# Patient Record
Sex: Male | Born: 1990 | Hispanic: No | Marital: Single | Smoking: Never smoker
Health system: Southern US, Community
[De-identification: ages and names within clinical notes are randomized; demographics above are authoritative.]

## PROBLEM LIST (undated history)

## (undated) DIAGNOSIS — I10 Essential (primary) hypertension: Secondary | ICD-10-CM

## (undated) DIAGNOSIS — G43909 Migraine, unspecified, not intractable, without status migrainosus: Secondary | ICD-10-CM

## (undated) DIAGNOSIS — F419 Anxiety disorder, unspecified: Secondary | ICD-10-CM

## (undated) DIAGNOSIS — K589 Irritable bowel syndrome without diarrhea: Secondary | ICD-10-CM

## (undated) HISTORY — PX: CHOLECYSTECTOMY: SHX55

## (undated) HISTORY — PX: TONSILLECTOMY: SUR1361

## (undated) HISTORY — PX: APPENDECTOMY: SHX54

---
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
## 2009-03-06 ENCOUNTER — Emergency Department: Payer: Self-pay | Admitting: Emergency Medicine

## 2017-09-07 ENCOUNTER — Ambulatory Visit
Admission: EM | Admit: 2017-09-07 | Discharge: 2017-09-07 | Disposition: A | Discharge status: Home / Self Care | Payer: BLUE CROSS/BLUE SHIELD | Attending: Family Medicine | Admitting: Family Medicine

## 2017-09-07 ENCOUNTER — Other Ambulatory Visit: Payer: Self-pay

## 2017-09-07 DIAGNOSIS — R509 Fever, unspecified: Secondary | ICD-10-CM

## 2017-09-07 DIAGNOSIS — J020 Streptococcal pharyngitis: Secondary | ICD-10-CM

## 2017-09-07 HISTORY — DX: Irritable bowel syndrome without diarrhea: K58.9

## 2017-09-07 HISTORY — DX: Essential (primary) hypertension: I10

## 2017-09-07 LAB — RAPID STREP SCREEN (MED CTR MEBANE ONLY): Streptococcus, Group A Screen (Direct): NEGATIVE

## 2017-09-07 MED ORDER — PENICILLIN G BENZATHINE 1200000 UNIT/2ML IM SUSP
1.2000 10*6.[IU] | Freq: Once | INTRAMUSCULAR | Status: AC
  Administered 2017-09-07: 1.2 10*6.[IU] via INTRAMUSCULAR

## 2017-09-07 MED ORDER — PENICILLIN G BENZATHINE & PROC 900000-300000 UNIT/2ML IM SUSP: 1.2000 10*6.[IU] | Freq: Once | INTRAMUSCULAR | Status: DC

## 2017-09-07 NOTE — Discharge Instructions (Signed)
Please take Tylenol or ibuprofen as needed for fevers or body aches.  Drink lots of fluids.  Return to the clinic for any worsening symptoms or any changes in her health.

## 2017-09-07 NOTE — ED Triage Notes (Signed)
Patient c/o sore throat, body aches and a slight headache x 2 days.

## 2017-09-07 NOTE — ED Provider Notes (Signed)
[facility]    CSN: [id number] Arrival date & time: 09/07/17  0901     History   Chief Complaint Chief Complaint  Patient presents with  . Sore Throat    HPI Glenn Rasmussen is a 26 y.o. male presents to the urgent care facility for evaluation of fever, sore throat, body aches times 2 days.  No known contact with strep.  He denies any cough congestion runny nose.  Patient states he has had strep several times, recently looked at his throat and noticed strep-like appearance.  He has not had a Tylenol or ibuprofen.  He is tolerating p.o. well.  Denies any trismus.  No rashes, abdominal pain, urinary changes.  HPI  Past Medical History:  Diagnosis Date  . Hypertension   . IBS (irritable bowel syndrome)     There are no active problems to display for this patient.   Past Surgical History:  Procedure Laterality Date  . APPENDECTOMY    . CHOLECYSTECTOMY    . TONSILLECTOMY         Home Medications    Prior to Admission medications   Medication Sig Start Date End Date Taking? Authorizing Provider  amLODipine-valsartan (EXFORGE) 10-320 MG tablet Take 1 tablet by mouth daily.   Yes [provider]  desipramine (NOPRAMIN) 150 MG tablet Take 150 mg by mouth at bedtime.   Yes [provider]  dicyclomine (BENTYL) 20 MG tablet Take 20 mg by mouth every 6 (six) hours.   Yes [provider]  hydrochlorothiazide (HYDRODIURIL) 12.5 MG tablet Take 12.5 mg by mouth daily.   Yes [provider]  linaclotide (LINZESS) 72 MCG capsule Take 72 mcg by mouth daily before breakfast.   Yes [provider]  QUEtiapine Fumarate (SEROQUEL XR) 150 MG 24 hr tablet Take 150 mg by mouth at bedtime.   Yes [provider]    Family History History reviewed. No pertinent family history.  Social History Social History   Tobacco Use  . Smoking status: Never Smoker  . Smokeless tobacco: Never Used  Substance Use Topics  . Alcohol  use: No    Frequency: Never  . Drug use: No     Allergies   Patient has no known allergies.   Review of Systems Review of Systems  Constitutional: Positive for fever.  HENT: Positive for sore throat. Negative for congestion, tinnitus and trouble swallowing.   Respiratory: Negative for cough and shortness of breath.   Cardiovascular: Negative for chest pain.  Gastrointestinal: Negative for abdominal pain.  Genitourinary: Negative for difficulty urinating, dysuria and urgency.  Musculoskeletal: Negative for back pain and myalgias.  Skin: Negative for rash.  Neurological: Negative for dizziness and headaches.     Physical Exam Triage Vital Signs ED Triage Vitals  Enc Vitals Group     BP 09/07/17 1002 140/72     Pulse Rate 09/07/17 1002 (!) 119     Resp --      Temp 09/07/17 1002 99.8 F (37.7 C)     Temp Source 09/07/17 1002 Oral     SpO2 09/07/17 1002 97 %     Weight 09/07/17 1003 210 lb (95.3 kg)     Height 09/07/17 1003 5\' 7"  (1.702 m)     Head Circumference --      Peak Flow --      Pain Score 09/07/17 1004 6     Pain Loc --      Pain Edu? --  Excl. in [place]? --    No data found.  Updated Vital Signs BP 140/72 (BP Location: Left Arm)   Pulse (!) 119   Temp 99.8 F (37.7 C) (Oral)   Ht 5\' 7"  (1.702 m)   Wt 210 lb (95.3 kg)   SpO2 97%   BMI 32.89 kg/m   Visual Acuity Right Eye Distance:   Left Eye Distance:   Bilateral Distance:    Right Eye Near:   Left Eye Near:    Bilateral Near:     Physical Exam  Constitutional: He is oriented to person, place, and time. He appears well-developed and well-nourished.  HENT:  Head: Normocephalic and atraumatic.  Right Ear: Hearing, tympanic membrane and ear canal normal.  Left Ear: Hearing, tympanic membrane, external ear and ear canal normal.  Mouth/Throat: Uvula is midline. Oropharyngeal exudate and posterior oropharyngeal erythema present. No posterior oropharyngeal edema.  Eyes: Conjunctivae are normal.    Neck: Normal range of motion.  Cardiovascular: Normal rate.  Pulmonary/Chest: Effort normal. No respiratory distress.  Musculoskeletal: Normal range of motion.  Lymphadenopathy:    He has cervical adenopathy (.  Anterior cervical lymphadenopathy).  Neurological: He is alert and oriented to person, place, and time.  Skin: Skin is warm. No rash noted.  Psychiatric: He has a normal mood and affect. His behavior is normal. Thought content normal.     UC Treatments / Results  Labs (all labs ordered are listed, but only abnormal results are displayed) Labs Reviewed  RAPID STREP SCREEN (NOT AT [facility])  CULTURE, GROUP A STREP [facility])    EKG  EKG Interpretation None       Radiology No results found.  Procedures Procedures (including critical care time)  Medications Ordered in UC Medications  penicillin g benzathine (BICILLIN LA) 1200000 UNIT/2ML injection 1.2 Million Units (1.2 Million Units Intramuscular Given 09/07/17 1028)     Initial Impression / Assessment and Plan / UC Course  I have reviewed the triage vital signs and the nursing notes.  Pertinent labs & imaging results that were available during my care of the patient were reviewed by me and considered in my medical decision making (see chart for details).     26 year old male with sore throat, fever, body aches and headache.  He has absence of cough and other cold symptoms with positive anterior cervical lymphadenopathy.  We will treat for strep.  Patient requested 1,200,000 units penicillin G IM.  Patient educated on signs and symptoms return to clinic for.   Final Clinical Impressions(s) / UC Diagnoses   Final diagnoses:  Pharyngitis due to Streptococcus species  Fever, unspecified    ED Discharge Orders    None        Duanne Guess, PA-C 09/07/17 1037

## 2017-09-09 LAB — CULTURE, GROUP A STREP (THRC)

## 2019-02-09 ENCOUNTER — Ambulatory Visit
Admission: EM | Admit: 2019-02-09 | Discharge: 2019-02-09 | Disposition: A | Discharge status: Home / Self Care | Payer: BLUE CROSS/BLUE SHIELD | Attending: Family Medicine | Admitting: Family Medicine

## 2019-02-09 ENCOUNTER — Ambulatory Visit (INDEPENDENT_AMBULATORY_CARE_PROVIDER_SITE_OTHER): Payer: BLUE CROSS/BLUE SHIELD

## 2019-02-09 ENCOUNTER — Other Ambulatory Visit: Payer: Self-pay

## 2019-02-09 DIAGNOSIS — S52124A Nondisplaced fracture of head of right radius, initial encounter for closed fracture: Secondary | ICD-10-CM

## 2019-02-09 DIAGNOSIS — W108XXA Fall (on) (from) other stairs and steps, initial encounter: Secondary | ICD-10-CM | POA: Diagnosis not present

## 2019-02-09 DIAGNOSIS — M79631 Pain in right forearm: Secondary | ICD-10-CM

## 2019-02-09 DIAGNOSIS — R079 Chest pain, unspecified: Secondary | ICD-10-CM

## 2019-02-09 DIAGNOSIS — S2231XA Fracture of one rib, right side, initial encounter for closed fracture: Secondary | ICD-10-CM | POA: Diagnosis not present

## 2019-02-09 DIAGNOSIS — M25531 Pain in right wrist: Secondary | ICD-10-CM

## 2019-02-09 MED ORDER — OXYCODONE-ACETAMINOPHEN 5-325 MG PO TABS: 1.0000 | ORAL_TABLET | Freq: Three times a day (TID) | ORAL | 0 refills | Status: DC | PRN

## 2019-02-09 NOTE — Discharge Instructions (Signed)
Follow up with orthopedics.  Pain medication as needed.  Take care  Dr. Lacinda Axon

## 2019-02-09 NOTE — ED Provider Notes (Signed)
[facility]    CSN: [id number] Arrival date & time: 02/09/19  1553  History   Chief Complaint Chief Complaint  Patient presents with  . Fall  . Wrist Pain  . Chest Pain   HPI 28 year old male presents with the above complaints.  Patient reports that 1 week ago he fell down 20 steps while carrying a mattress.  Patient states that he injured his right wrist and forearm.  Patient has bruising of his upper medial right arm.  Patient also reports a discrete area of rib pain anteriorly on the right side.  Pain is currently severe, 8/10 in severity.  Worse with range of motion of the forearm and wrist.  Patient also has pain when taking a deep breath.  No reports of shortness of breath.  No fever.  No other associated symptoms.  No relieving factors.  No other complaints.  Past Medical History:  Diagnosis Date  . Hypertension   . IBS (irritable bowel syndrome)    Past Surgical History:  Procedure Laterality Date  . APPENDECTOMY    . CHOLECYSTECTOMY    . TONSILLECTOMY     Home Medications    Prior to Admission medications   Medication Sig Start Date End Date Taking? Authorizing Provider  amLODipine-valsartan (EXFORGE) 10-320 MG tablet Take 1 tablet by mouth daily.   Yes [provider]  clonazePAM (KLONOPIN) 0.5 MG tablet TK 1 T PO BID UTD 07/31/18  Yes [provider]  desipramine (NOPRAMIN) 150 MG tablet Take 150 mg by mouth at bedtime.   Yes [provider]  dicyclomine (BENTYL) 20 MG tablet Take 20 mg by mouth every 6 (six) hours.   Yes [provider]  eszopiclone (LUNESTA) 1 MG TABS tablet Take 1 mg by mouth at bedtime as needed for sleep. Take immediately before bedtime   Yes [provider]  hydrochlorothiazide (HYDRODIURIL) 12.5 MG tablet Take 12.5 mg by mouth daily.   Yes [provider]  lisdexamfetamine (VYVANSE) 30 MG capsule TK 1 C PO QD UTD 07/09/18  Yes [provider]  promethazine (PHENERGAN)  25 MG tablet Take 25 mg by mouth every 6 (six) hours as needed for nausea or vomiting.   Yes [provider]  traZODone (DESYREL) 100 MG tablet TK 2 AND 1/2 TS PO HS PRN 07/18/18  Yes [provider]  losartan (COZAAR) 100 MG tablet Take by mouth.    [provider]  oxyCODONE-acetaminophen (PERCOCET/ROXICET) 5-325 MG tablet Take 1 tablet by mouth every 8 (eight) hours as needed for severe pain. 02/09/19   Coral Spikes, DO   Family History Family History  Problem Relation Age of Onset  . Lung cancer Mother   . Lung cancer Father    Social History Social History   Tobacco Use  . Smoking status: Never Smoker  . Smokeless tobacco: Never Used  Substance Use Topics  . Alcohol use: No    Frequency: Never  . Drug use: No    Allergies   Patient has no known allergies.   Review of Systems Review of Systems  Respiratory: Negative for shortness of breath.   Musculoskeletal:       Right lower rib pain, Right forearm pain, Right wrist pain.   Physical Exam Triage Vital Signs ED Triage Vitals  Enc Vitals Group     BP 02/09/19 1613 132/82     Pulse Rate 02/09/19 1613 76     Resp 02/09/19 1613 16  Temp 02/09/19 1613 98.8 F (37.1 C)     Temp Source 02/09/19 1613 Oral     SpO2 02/09/19 1613 100 %     Weight 02/09/19 1610 200 lb (90.7 kg)     Height 02/09/19 1610 5\' 7"  (1.702 m)     Head Circumference --      Peak Flow --      Pain Score 02/09/19 1610 8     Pain Loc --      Pain Edu? --      Excl. in [place]? --    Updated Vital Signs BP 132/82 (BP Location: Right Arm)   Pulse 76   Temp 98.8 F (37.1 C) (Oral)   Resp 16   Ht 5\' 7"  (1.702 m)   Wt 90.7 kg   SpO2 100%   BMI 31.32 kg/m   Visual Acuity Right Eye Distance:   Left Eye Distance:   Bilateral Distance:    Right Eye Near:   Left Eye Near:    Bilateral Near:     Physical Exam Vitals signs and nursing note reviewed.  Constitutional:      General: He is not in acute distress.     Appearance: Normal appearance.  HENT:     Head: Normocephalic and atraumatic.  Eyes:     General:        Right eye: No discharge.        Left eye: No discharge.     Conjunctiva/sclera: Conjunctivae normal.  Cardiovascular:     Rate and Rhythm: Normal rate and regular rhythm.  Pulmonary:     Effort: Pulmonary effort is normal.     Breath sounds: Normal breath sounds. No wheezing or rales.  Musculoskeletal:     Comments: Right wrist -patient with tenderness particularly on the ulnar aspect.  Right forearm and elbow -patient with diffuse tenderness along the ulna.  Patient also has a discrete area of tenderness near the radial head.  Skin:    Comments: Bruising noted on the right upper medial arm.  Neurological:     Mental Status: He is alert.  Psychiatric:        Mood and Affect: Mood normal.        Behavior: Behavior normal.    UC Treatments / Results  Labs (all labs ordered are listed, but only abnormal results are displayed) Labs Reviewed - No data to display  EKG None  Radiology Dg Ribs Unilateral W/chest Right  Result Date: 02/09/2019 CLINICAL DATA:  28 year old male status post fall down 20 stairs EXAM: RIGHT RIBS AND CHEST - 3+ VIEW COMPARISON:  Prior chest x-ray 12/29/2006 FINDINGS: Subtle minimally displaced fracture of the anterolateral aspect of the right seventh rib. There is no evidence of pneumothorax or pleural effusion. Both lungs are clear. Heart size and mediastinal contours are within normal limits. IMPRESSION: Subtle nondisplaced fracture of the anterolateral aspect of the right seventh rib. Electronically Signed   By: Jacqulynn Cadet M.D.   On: 02/09/2019 17:24   Dg Forearm Right  Result Date: 02/09/2019 CLINICAL DATA:  History of multiple falls with proximal forearm pain, initial encounter EXAM: RIGHT FOREARM - 2 VIEW COMPARISON:  None. FINDINGS: There is a fracture of the radial head with articular extension identified. Mild impaction is noted at the  fracture site. Joint effusion is noted with elevation of the anterior fat pad. IMPRESSION: Radial head fracture with articular extension. Mildly impacted radial head fracture. Electronically Signed   By: Linus Mako.D.  On: 02/09/2019 17:24   Dg Wrist Complete Right  Result Date: 02/09/2019 CLINICAL DATA:  Recent falls with wrist pain, initial encounter EXAM: RIGHT WRIST - COMPLETE 3+ VIEW COMPARISON:  None. FINDINGS: No acute fracture is noted. Mild sclerosis is noted in the midportion of the scaphoid bone which may be related to prior trauma. No other bony abnormality is seen. No soft tissue abnormality is noted. IMPRESSION: No acute bony abnormality noted. Electronically Signed   By: Inez Catalina M.D.   On: 02/09/2019 17:22    Procedures Procedures (including critical care time)  Medications Ordered in UC Medications - No data to display  Initial Impression / Assessment and Plan / UC Course  I have reviewed the triage vital signs and the nursing notes.  Pertinent labs & imaging results that were available during my care of the patient were reviewed by me and considered in my medical decision making (see chart for details).    28 year old male presents for evaluation after suffering a fall.  X-rays of his wrist were negative.  X-ray of the forearm revealed a radial head fracture with articular in extension.  Mild impaction.  Patient placed in a sling.  Patient also found to have 7th rib fracture.  [place] controlled substance database reviewed.  Patient has received a recent prescription for Vicodin as well as a fairly recent prescription for hydromorphone.  In addition, patient is on Klonopin and Vyvanse.  I have concerns about his recent opiate prescriptions as well as the other controlled substances.  However, in the setting of acute fractures, I feel that pain medication is warranted.  I will only give him 6 pills due to my concerns.  Rx sent for oxycodone.  Advised to follow-up  with orthopedics.  Final Clinical Impressions(s) / UC Diagnoses   Final diagnoses:  Closed nondisplaced fracture of head of right radius, initial encounter  Closed fracture of one rib of right side, initial encounter     Discharge Instructions     Follow up with orthopedics.  Pain medication as needed.  Take care  Dr. Lacinda Axon    ED Prescriptions    Medication Sig Dispense Auth. Provider   oxyCODONE-acetaminophen (PERCOCET/ROXICET) 5-325 MG tablet Take 1 tablet by mouth every 8 (eight) hours as needed for severe pain. 6 tablet Coral Spikes, DO     Controlled Substance Prescriptions [place] Controlled Substance Registry consulted? Yes, I have consulted the [place] Controlled Substances Registry for this patient.   Coral Spikes, [place] 02/09/19 1908

## 2019-02-09 NOTE — ED Triage Notes (Signed)
Patient complains of right wrist pain that occurred after a fall that happened 1 week ago. Patient states that he has also been having rib pain on right side and is concerned that they may be broken. Patient reports that rib pain is worsening when laying down.

## 2019-03-08 ENCOUNTER — Inpatient Hospital Stay
Admission: EM | Admit: 2019-03-08 | Discharge: 2019-03-30 | DRG: 917 | Disposition: A | Discharge status: Other Acute Inpatient Hospital | Payer: BC Managed Care – PPO | Attending: Internal Medicine | Admitting: Internal Medicine

## 2019-03-08 ENCOUNTER — Encounter: Payer: Self-pay | Admitting: Emergency Medicine

## 2019-03-08 ENCOUNTER — Other Ambulatory Visit: Payer: Self-pay

## 2019-03-08 DIAGNOSIS — Z978 Presence of other specified devices: Secondary | ICD-10-CM

## 2019-03-08 DIAGNOSIS — N179 Acute kidney failure, unspecified: Secondary | ICD-10-CM | POA: Diagnosis present

## 2019-03-08 DIAGNOSIS — Z4659 Encounter for fitting and adjustment of other gastrointestinal appliance and device: Secondary | ICD-10-CM

## 2019-03-08 DIAGNOSIS — D631 Anemia in chronic kidney disease: Secondary | ICD-10-CM | POA: Diagnosis present

## 2019-03-08 DIAGNOSIS — E876 Hypokalemia: Secondary | ICD-10-CM | POA: Diagnosis present

## 2019-03-08 DIAGNOSIS — K589 Irritable bowel syndrome without diarrhea: Secondary | ICD-10-CM | POA: Diagnosis present

## 2019-03-08 DIAGNOSIS — R402132 Coma scale, eyes open, to sound, at arrival to emergency department: Secondary | ICD-10-CM | POA: Diagnosis present

## 2019-03-08 DIAGNOSIS — K219 Gastro-esophageal reflux disease without esophagitis: Secondary | ICD-10-CM | POA: Diagnosis present

## 2019-03-08 DIAGNOSIS — E875 Hyperkalemia: Secondary | ICD-10-CM | POA: Diagnosis not present

## 2019-03-08 DIAGNOSIS — T424X2A Poisoning by benzodiazepines, intentional self-harm, initial encounter: Secondary | ICD-10-CM | POA: Diagnosis present

## 2019-03-08 DIAGNOSIS — R34 Anuria and oliguria: Secondary | ICD-10-CM | POA: Diagnosis not present

## 2019-03-08 DIAGNOSIS — F329 Major depressive disorder, single episode, unspecified: Secondary | ICD-10-CM

## 2019-03-08 DIAGNOSIS — F32A Depression, unspecified: Secondary | ICD-10-CM

## 2019-03-08 DIAGNOSIS — H539 Unspecified visual disturbance: Secondary | ICD-10-CM

## 2019-03-08 DIAGNOSIS — T465X2A Poisoning by other antihypertensive drugs, intentional self-harm, initial encounter: Secondary | ICD-10-CM

## 2019-03-08 DIAGNOSIS — N170 Acute kidney failure with tubular necrosis: Secondary | ICD-10-CM | POA: Diagnosis present

## 2019-03-08 DIAGNOSIS — G4700 Insomnia, unspecified: Secondary | ICD-10-CM | POA: Diagnosis not present

## 2019-03-08 DIAGNOSIS — T50911A Poisoning by multiple unspecified drugs, medicaments and biological substances, accidental (unintentional), initial encounter: Secondary | ICD-10-CM | POA: Diagnosis present

## 2019-03-08 DIAGNOSIS — T426X2A Poisoning by other antiepileptic and sedative-hypnotic drugs, intentional self-harm, initial encounter: Secondary | ICD-10-CM | POA: Diagnosis not present

## 2019-03-08 DIAGNOSIS — R402252 Coma scale, best verbal response, oriented, at arrival to emergency department: Secondary | ICD-10-CM | POA: Diagnosis present

## 2019-03-08 DIAGNOSIS — J80 Acute respiratory distress syndrome: Secondary | ICD-10-CM | POA: Diagnosis not present

## 2019-03-08 DIAGNOSIS — R570 Cardiogenic shock: Secondary | ICD-10-CM | POA: Diagnosis not present

## 2019-03-08 DIAGNOSIS — R45851 Suicidal ideations: Secondary | ICD-10-CM

## 2019-03-08 DIAGNOSIS — J153 Pneumonia due to streptococcus, group B: Secondary | ICD-10-CM | POA: Diagnosis not present

## 2019-03-08 DIAGNOSIS — R0603 Acute respiratory distress: Secondary | ICD-10-CM

## 2019-03-08 DIAGNOSIS — R0902 Hypoxemia: Secondary | ICD-10-CM

## 2019-03-08 DIAGNOSIS — A419 Sepsis, unspecified organism: Secondary | ICD-10-CM | POA: Diagnosis not present

## 2019-03-08 DIAGNOSIS — Z992 Dependence on renal dialysis: Secondary | ICD-10-CM

## 2019-03-08 DIAGNOSIS — T50902A Poisoning by unspecified drugs, medicaments and biological substances, intentional self-harm, initial encounter: Secondary | ICD-10-CM | POA: Diagnosis present

## 2019-03-08 DIAGNOSIS — I120 Hypertensive chronic kidney disease with stage 5 chronic kidney disease or end stage renal disease: Secondary | ICD-10-CM | POA: Diagnosis present

## 2019-03-08 DIAGNOSIS — R402362 Coma scale, best motor response, obeys commands, at arrival to emergency department: Secondary | ICD-10-CM | POA: Diagnosis present

## 2019-03-08 DIAGNOSIS — J690 Pneumonitis due to inhalation of food and vomit: Secondary | ICD-10-CM | POA: Diagnosis present

## 2019-03-08 DIAGNOSIS — Z452 Encounter for adjustment and management of vascular access device: Secondary | ICD-10-CM

## 2019-03-08 DIAGNOSIS — F332 Major depressive disorder, recurrent severe without psychotic features: Secondary | ICD-10-CM | POA: Diagnosis present

## 2019-03-08 DIAGNOSIS — Z789 Other specified health status: Secondary | ICD-10-CM

## 2019-03-08 DIAGNOSIS — I10 Essential (primary) hypertension: Secondary | ICD-10-CM | POA: Diagnosis present

## 2019-03-08 DIAGNOSIS — J9600 Acute respiratory failure, unspecified whether with hypoxia or hypercapnia: Secondary | ICD-10-CM

## 2019-03-08 DIAGNOSIS — I959 Hypotension, unspecified: Secondary | ICD-10-CM

## 2019-03-08 DIAGNOSIS — E8770 Fluid overload, unspecified: Secondary | ICD-10-CM | POA: Diagnosis not present

## 2019-03-08 DIAGNOSIS — G92 Toxic encephalopathy: Secondary | ICD-10-CM | POA: Diagnosis present

## 2019-03-08 DIAGNOSIS — I472 Ventricular tachycardia: Secondary | ICD-10-CM | POA: Diagnosis not present

## 2019-03-08 DIAGNOSIS — R079 Chest pain, unspecified: Secondary | ICD-10-CM

## 2019-03-08 DIAGNOSIS — E870 Hyperosmolality and hypernatremia: Secondary | ICD-10-CM | POA: Diagnosis not present

## 2019-03-08 DIAGNOSIS — T1491XA Suicide attempt, initial encounter: Secondary | ICD-10-CM

## 2019-03-08 DIAGNOSIS — Z66 Do not resuscitate: Secondary | ICD-10-CM | POA: Diagnosis present

## 2019-03-08 DIAGNOSIS — J9690 Respiratory failure, unspecified, unspecified whether with hypoxia or hypercapnia: Secondary | ICD-10-CM

## 2019-03-08 DIAGNOSIS — R6521 Severe sepsis with septic shock: Secondary | ICD-10-CM | POA: Diagnosis not present

## 2019-03-08 DIAGNOSIS — I248 Other forms of acute ischemic heart disease: Secondary | ICD-10-CM | POA: Diagnosis not present

## 2019-03-08 DIAGNOSIS — Z801 Family history of malignant neoplasm of trachea, bronchus and lung: Secondary | ICD-10-CM

## 2019-03-08 DIAGNOSIS — T461X1A Poisoning by calcium-channel blockers, accidental (unintentional), initial encounter: Secondary | ICD-10-CM

## 2019-03-08 DIAGNOSIS — K567 Ileus, unspecified: Secondary | ICD-10-CM | POA: Diagnosis not present

## 2019-03-08 DIAGNOSIS — Z20828 Contact with and (suspected) exposure to other viral communicable diseases: Secondary | ICD-10-CM | POA: Diagnosis present

## 2019-03-08 DIAGNOSIS — E874 Mixed disorder of acid-base balance: Secondary | ICD-10-CM | POA: Diagnosis not present

## 2019-03-08 DIAGNOSIS — T461X2A Poisoning by calcium-channel blockers, intentional self-harm, initial encounter: Secondary | ICD-10-CM

## 2019-03-08 DIAGNOSIS — T465X1A Poisoning by other antihypertensive drugs, accidental (unintentional), initial encounter: Secondary | ICD-10-CM

## 2019-03-08 DIAGNOSIS — F419 Anxiety disorder, unspecified: Secondary | ICD-10-CM | POA: Diagnosis present

## 2019-03-08 DIAGNOSIS — R109 Unspecified abdominal pain: Secondary | ICD-10-CM

## 2019-03-08 LAB — CBC WITH DIFFERENTIAL/PLATELET
Abs Immature Granulocytes: 0.19 10*3/uL — ABNORMAL HIGH (ref 0.00–0.07)
Basophils Absolute: 0.1 10*3/uL (ref 0.0–0.1)
Basophils Relative: 1 %
Eosinophils Absolute: 0.2 10*3/uL (ref 0.0–0.5)
Eosinophils Relative: 1 %
HCT: 41.8 % (ref 39.0–52.0)
Hemoglobin: 14.3 g/dL (ref 13.0–17.0)
Immature Granulocytes: 1 %
Lymphocytes Relative: 37 %
Lymphs Abs: 5.5 10*3/uL — ABNORMAL HIGH (ref 0.7–4.0)
MCH: 27 pg (ref 26.0–34.0)
MCHC: 34.2 g/dL (ref 30.0–36.0)
MCV: 78.9 fL — ABNORMAL LOW (ref 80.0–100.0)
Monocytes Absolute: 1.2 10*3/uL — ABNORMAL HIGH (ref 0.1–1.0)
Monocytes Relative: 8 %
Neutro Abs: 7.6 10*3/uL (ref 1.7–7.7)
Neutrophils Relative %: 52 %
Platelets: 406 10*3/uL — ABNORMAL HIGH (ref 150–400)
RBC: 5.3 MIL/uL (ref 4.22–5.81)
RDW: 12.8 % (ref 11.5–15.5)
WBC: 14.8 10*3/uL — ABNORMAL HIGH (ref 4.0–10.5)
nRBC: 0 % (ref 0.0–0.2)

## 2019-03-08 LAB — PROTIME-INR
INR: 0.9 (ref 0.8–1.2)
Prothrombin Time: 12 seconds (ref 11.4–15.2)

## 2019-03-08 NOTE — ED Triage Notes (Addendum)
Pt arrives via [facility] with c/o OD due to SI thoughts. Pt states thatt he was "feeling overwhelmed" and pt is A/O at this time. Pt reports taking 38 Phenergan, 19 Losartan, 42 Amlodipine, 78 Clonipine. Per EMS, pt's last BP was 91/81. Pt states that he is not SI at this time.

## 2019-03-08 NOTE — ED Provider Notes (Signed)
[facility] Emergency Department Provider Note  ____________________________________________   First MD Initiated Contact with Patient 03/08/19 2316     (approximate)  I have reviewed the triage vital signs and the nursing notes.   HISTORY  Chief Complaint Drug Overdose    HPI Glenn Rasmussen is a 28 y.o. male with medical history as listed below who presents by EMS for an alleged overdose of prescription medicine.  He reports that at about 7:30 PM (about 4 hours prior to arrival) he took the following:  38 Phenergan, 19 Losartan, 42 Amlodipine, 78 Klonopin.  He claims he counted the pills before he took them.  He admits that this was a suicide attempt and said that with everything going on the world that is just too much and he did not want to go on living.  He is somewhat evasive and ambivalent about whether he still wants to die, but he made the comment "the damage has been done".  He has no symptoms at this time.  The overdose was severe and the intention was severe and the episode was acute.  Apparently after he took the medicines he texted 1 or more friends, allegedly to say goodbye, and they called EMS.  He is calm and cooperative at this time.  He denies headache, nausea, vomiting, chest pain, shortness of breath, fever/chills, sore throat, recent illness, abdominal pain, and dysuria.  He denies ingesting Tylenol, aspirin, opiates.  There is initially some confusion and the Klonopin that he took was erroneously listed as clonidine in the nursing note but I see the empty bottle of Klonopin and he confirmed it is Klonopin for anxiety, not clonidine for blood pressure.        Past Medical History:  Diagnosis Date   Hypertension    IBS (irritable bowel syndrome)     Patient Active Problem List   Diagnosis Date Noted   Multiple drug overdose 03/09/2019   Suicide attempt ([place]) 03/09/2019   HTN (hypertension) 03/09/2019   AKI (acute kidney injury)  ([place]) 03/09/2019    Past Surgical History:  Procedure Laterality Date   APPENDECTOMY     CHOLECYSTECTOMY     TONSILLECTOMY      Prior to Admission medications   Medication Sig Start Date End Date Taking? Authorizing Provider  amLODipine-valsartan (EXFORGE) 10-320 MG tablet Take 1 tablet by mouth daily.    [provider]  clonazePAM (KLONOPIN) 0.5 MG tablet TK 1 T PO BID UTD 07/31/18   [provider]  desipramine (NOPRAMIN) 150 MG tablet Take 150 mg by mouth at bedtime.    [provider]  dicyclomine (BENTYL) 20 MG tablet Take 20 mg by mouth every 6 (six) hours.    [provider]  eszopiclone (LUNESTA) 1 MG TABS tablet Take 1 mg by mouth at bedtime as needed for sleep. Take immediately before bedtime    [provider]  hydrochlorothiazide (HYDRODIURIL) 12.5 MG tablet Take 12.5 mg by mouth daily.    [provider]  lisdexamfetamine (VYVANSE) 30 MG capsule TK 1 C PO QD UTD 07/09/18   [provider]  losartan (COZAAR) 100 MG tablet Take by mouth.    [provider]  oxyCODONE-acetaminophen (PERCOCET/ROXICET) 5-325 MG tablet Take 1 tablet by mouth every 8 (eight) hours as needed for severe pain. 02/09/19   Coral Spikes, DO  promethazine (PHENERGAN) 25 MG tablet Take 25 mg by mouth every 6 (six) hours as needed for nausea or vomiting.  [provider]  traZODone (DESYREL) 100 MG tablet TK 2 AND 1/2 TS PO HS PRN 07/18/18   [provider]    Allergies Patient has no known allergies.  Family History  Problem Relation Age of Onset   Lung cancer Mother    Lung cancer Father     Social History Social History   Tobacco Use   Smoking status: Never Smoker   Smokeless tobacco: Never Used  Substance Use Topics   Alcohol use: No    Frequency: Never   Drug use: No    Review of Systems Constitutional: No fever/chills Eyes: No visual changes. ENT: No sore throat. Cardiovascular:  Denies chest pain. Respiratory: Denies shortness of breath. Gastrointestinal: No abdominal pain.  No nausea, no vomiting.  No diarrhea.  No constipation. Genitourinary: Negative for dysuria. Musculoskeletal: Negative for neck pain.  Negative for back pain. Integumentary: Negative for rash. Neurological: Negative for headaches, focal weakness or numbness. Psychiatric:  Intentional overdose with intent to commit suicide.  ____________________________________________   PHYSICAL EXAM:  VITAL SIGNS: ED Triage Vitals  Enc Vitals Group     BP 03/08/19 2322 (!) 104/38     Pulse Rate 03/08/19 2322 (!) 107     Resp 03/08/19 2322 (!) 31     Temp 03/08/19 2322 98.3 F (36.8 C)     Temp Source 03/08/19 2322 Oral     SpO2 03/08/19 2322 96 %     Weight 03/08/19 2319 90.7 kg (200 lb)     Height 03/08/19 2319 1.702 m (5\' 7" )     Head Circumference --      Peak Flow --      Pain Score 03/08/19 2319 6     Pain Loc --      Pain Edu? --      Excl. in [place]? --     Constitutional: Alert and oriented. Well appearing and in no acute distress. Eyes: Conjunctivae are normal. PERRL. EOMI. Head: Atraumatic. Nose: No congestion/rhinnorhea. Mouth/Throat: Mucous membranes are moist. Neck: No stridor.  No meningeal signs.   Cardiovascular: Normal rate, regular rhythm. Good peripheral circulation. Grossly normal heart sounds. Respiratory: Normal respiratory effort.  No retractions. No audible wheezing. Gastrointestinal: Soft and nontender. No distention.  Musculoskeletal: No lower extremity tenderness nor edema. No gross deformities of extremities. Neurologic:  Normal speech and language. No gross focal neurologic deficits are appreciated.  Skin:  Skin is warm, dry and intact. No rash noted. Psychiatric: Mood and affect are normal. Speech and behavior are normal.  Patient admits to a suicide attempt by prescription drug overdose.  ____________________________________________   LABS (all labs ordered  are listed, but only abnormal results are displayed)  Labs Reviewed  COMPREHENSIVE METABOLIC PANEL - Abnormal; Notable for the following components:      Result Value   Potassium 3.2 (*)    CO2 21 (*)    Glucose, Bld 136 (*)    Creatinine, Ser 1.26 (*)    All other components within normal limits  ACETAMINOPHEN LEVEL - Abnormal; Notable for the following components:   Acetaminophen (Tylenol), Serum <10 (*)    All other components within normal limits  CBC WITH DIFFERENTIAL/PLATELET - Abnormal; Notable for the following components:   WBC 14.8 (*)    MCV 78.9 (*)    Platelets 406 (*)    Lymphs Abs 5.5 (*)    Monocytes Absolute 1.2 (*)    Abs Immature Granulocytes 0.19 (*)    All other components within normal  limits  SARS CORONAVIRUS 2 (HOSPITAL ORDER, PERFORMED IN [facility])  ETHANOL  SALICYLATE LEVEL  LIPASE, BLOOD  TROPONIN I  PROTIME-INR  MAGNESIUM  URINALYSIS, COMPLETE (UACMP) WITH MICROSCOPIC  URINE DRUG SCREEN, QUALITATIVE ([facility] ONLY)  CBG MONITORING, ED   ____________________________________________  EKG  ED ECG REPORT I, Hinda Kehr, the attending physician, personally viewed and interpreted this ECG.  Date: 03/08/2019 EKG Time: 23:32 Rate: 107 Rhythm: sinus tachycardia QRS Axis: normal Intervals: normal ST/T Wave abnormalities: normal Narrative Interpretation: no evidence of acute ischemia  ____________________________________________  RADIOLOGY   ED MD interpretation: No indication for imaging  Official radiology report(s): No results found.  ____________________________________________   PROCEDURES   Procedure(s) performed (including Critical Care):  .Critical Care Performed by: Hinda Kehr, MD Authorized by: Hinda Kehr, MD   Critical care provider statement:    Critical care time (minutes):  30   Critical care time was exclusive of:  Separately billable procedures and treating other patients   Critical care was  necessary to treat or prevent imminent or life-threatening deterioration of the following conditions:  [place]   Critical care was time spent personally by me on the following activities:  Development of treatment plan with patient or surrogate, discussions with consultants, evaluation of patient's response to treatment, examination of patient, obtaining history from patient or surrogate, ordering and performing treatments and interventions, ordering and review of laboratory studies, ordering and review of radiographic studies, pulse oximetry, re-evaluation of patient's condition and review of old charts     ____________________________________________   [place] / MDM / [place] / ED COURSE  As part of my medical decision making, I reviewed the following data within the [place] notes reviewed and incorporated, Labs reviewed , EKG interpreted , Old chart reviewed, Discussed with admitting physician (Dr. Jannifer [facility]) and Notes from prior ED visits      *JAYMARI CROMIE was evaluated in Emergency Department on 03/09/2019 for the symptoms described in the history of present illness. He was evaluated in the context of the global COVID-19 pandemic, which necessitated consideration that the patient might be at risk for infection with the SARS-CoV-2 virus that causes COVID-19. Institutional protocols and algorithms that pertain to the evaluation of patients at risk for COVID-19 are in a state of rapid change based on information released by regulatory bodies including the CDC and federal and state organizations. These policies and algorithms were followed during the patient's care in the ED.  Some ED evaluations and interventions may be delayed as a result of limited staffing during the pandemic.*  Differential diagnosis includes, but is not limited to, suicide overdose by prescription medicines including multiple antihypertensives including losartan and calcium  channel blockers, benzodiazepine overdose, unknown substance overdose if he is not being truthful about what he took.  No evidence of acute infectious process.  Side effects can include although are not limited to hyperglycemia, hypotension, respiratory depression/failure, cardiac arrest, QT prolongation.  His nurse, Wells Guiles, contacted poison control who recommended 12-hour observation due to the amlodipine.  Initially he was normotensive but then his systolic blood pressure dropped to 75.  2 L of normal saline have started.  We will watch closely to see if he needs high-dose insulin and glucose and possibly glucagon plus the possibility of calcium gluconate or calcium chloride boluses versus infusions.  The patient has been placed under involuntary commitment.  He is conscious, alert, oriented, and protecting his airway at this time.  Clinical  Course as of Mar 08 144  Mon Mar 09, 2019  0057 The patient is persistently hypotensive in spite of 2 L bolus going on now.  Systolic blood pressure is around 76.  He remains alert and oriented, "just a little bit lightheaded".I am giving him calcium gluconate 1 g IV and glucagon 3 mg IV.  I spoke with Shanon Brow the pharmacist who pointed me towards the calcium channel blocker overdose order set and I have ordered the infusion insulin as well as D10 but am holding off on the high-dose insulin bolus at this time.  I discussed the case with the hospitalist, Dr. Jannifer [facility], who will admit to the ICU.   [CF]  0102 Patient cannot urinate which is likely due to promethazine overdose.  Ordering Foley catheter placement.   [CF]  0102 Labs are notable for an essentially normal comprehensive metabolic panel except for a slightly low potassium of 3.2.  I am ordering potassium repletion given that we are going to be giving him insulin infusion.  Acetaminophen and salicylate levels are negative.  Novel coronavirus 2 swab is negative.  Ethanol is negative.   [CF]  0104 Since the patient  is still awake and alert, I have ordered potassium 40 mEq by mouth as well as potassium chloride 10 mEq by IV.   [CF]    Clinical Course User Index [CF] Hinda Kehr, MD     ____________________________________________  FINAL CLINICAL IMPRESSION(S) / ED DIAGNOSES  Final diagnoses:  Calcium channel blocker overdose, intentional self-harm, initial encounter ([place])  Depression, unspecified depression type  Suicidal ideation  Suicide attempt ([place])  Hypotension, unspecified hypotension type  Antihypertensive agent overdose, intentional self-harm, initial encounter ([place])  Intentional benzodiazepine overdose, initial encounter ([place])     MEDICATIONS GIVEN DURING THIS VISIT:  Medications  calcium gluconate 1 g/ 50 mL sodium chloride IVPB (1,000 mg Intravenous New Bag/Given 03/09/19 0056)  dextrose 50 % solution 45.35 g (has no administration in time range)  dextrose 10 % infusion (has no administration in time range)  potassium chloride 10 mEq in 100 mL IVPB (has no administration in time range)  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   Hinda Kehr, MD 03/09/19 [phone]

## 2019-03-08 NOTE — ED Notes (Signed)
Poison control [place], [phone] gave recommendations on fluids and 12 hour watch time for Amlodipine for self harm. Poison control also warned against phenergan causing urinary retention.

## 2019-03-09 ENCOUNTER — Inpatient Hospital Stay: Payer: BC Managed Care – PPO

## 2019-03-09 ENCOUNTER — Inpatient Hospital Stay: Admit: 2019-03-09 | Payer: BC Managed Care – PPO

## 2019-03-09 ENCOUNTER — Inpatient Hospital Stay
Admit: 2019-03-09 | Discharge: 2019-03-09 | Disposition: A | Discharge status: Home / Self Care | Payer: BC Managed Care – PPO | Attending: Pulmonary Disease | Admitting: Pulmonary Disease

## 2019-03-09 DIAGNOSIS — R6521 Severe sepsis with septic shock: Secondary | ICD-10-CM | POA: Diagnosis not present

## 2019-03-09 DIAGNOSIS — E874 Mixed disorder of acid-base balance: Secondary | ICD-10-CM | POA: Diagnosis not present

## 2019-03-09 DIAGNOSIS — E870 Hyperosmolality and hypernatremia: Secondary | ICD-10-CM | POA: Diagnosis not present

## 2019-03-09 DIAGNOSIS — R570 Cardiogenic shock: Secondary | ICD-10-CM | POA: Diagnosis not present

## 2019-03-09 DIAGNOSIS — T426X2A Poisoning by other antiepileptic and sedative-hypnotic drugs, intentional self-harm, initial encounter: Secondary | ICD-10-CM | POA: Diagnosis present

## 2019-03-09 DIAGNOSIS — T461X2A Poisoning by calcium-channel blockers, intentional self-harm, initial encounter: Secondary | ICD-10-CM | POA: Diagnosis present

## 2019-03-09 DIAGNOSIS — J153 Pneumonia due to streptococcus, group B: Secondary | ICD-10-CM | POA: Diagnosis not present

## 2019-03-09 DIAGNOSIS — R45851 Suicidal ideations: Secondary | ICD-10-CM | POA: Diagnosis present

## 2019-03-09 DIAGNOSIS — T1491XA Suicide attempt, initial encounter: Secondary | ICD-10-CM | POA: Diagnosis not present

## 2019-03-09 DIAGNOSIS — I10 Essential (primary) hypertension: Secondary | ICD-10-CM | POA: Diagnosis not present

## 2019-03-09 DIAGNOSIS — T461X1D Poisoning by calcium-channel blockers, accidental (unintentional), subsequent encounter: Secondary | ICD-10-CM | POA: Diagnosis not present

## 2019-03-09 DIAGNOSIS — R402132 Coma scale, eyes open, to sound, at arrival to emergency department: Secondary | ICD-10-CM | POA: Diagnosis present

## 2019-03-09 DIAGNOSIS — A419 Sepsis, unspecified organism: Secondary | ICD-10-CM | POA: Diagnosis not present

## 2019-03-09 DIAGNOSIS — T50912A Poisoning by multiple unspecified drugs, medicaments and biological substances, intentional self-harm, initial encounter: Secondary | ICD-10-CM | POA: Diagnosis not present

## 2019-03-09 DIAGNOSIS — J9601 Acute respiratory failure with hypoxia: Secondary | ICD-10-CM

## 2019-03-09 DIAGNOSIS — R0603 Acute respiratory distress: Secondary | ICD-10-CM | POA: Diagnosis not present

## 2019-03-09 DIAGNOSIS — T50911A Poisoning by multiple unspecified drugs, medicaments and biological substances, accidental (unintentional), initial encounter: Secondary | ICD-10-CM | POA: Diagnosis not present

## 2019-03-09 DIAGNOSIS — N170 Acute kidney failure with tubular necrosis: Secondary | ICD-10-CM | POA: Diagnosis present

## 2019-03-09 DIAGNOSIS — J690 Pneumonitis due to inhalation of food and vomit: Secondary | ICD-10-CM | POA: Diagnosis present

## 2019-03-09 DIAGNOSIS — N179 Acute kidney failure, unspecified: Secondary | ICD-10-CM

## 2019-03-09 DIAGNOSIS — F411 Generalized anxiety disorder: Secondary | ICD-10-CM

## 2019-03-09 DIAGNOSIS — T461X1A Poisoning by calcium-channel blockers, accidental (unintentional), initial encounter: Secondary | ICD-10-CM

## 2019-03-09 DIAGNOSIS — T465X2A Poisoning by other antihypertensive drugs, intentional self-harm, initial encounter: Secondary | ICD-10-CM | POA: Diagnosis present

## 2019-03-09 DIAGNOSIS — T465X1A Poisoning by other antihypertensive drugs, accidental (unintentional), initial encounter: Secondary | ICD-10-CM

## 2019-03-09 DIAGNOSIS — N186 End stage renal disease: Secondary | ICD-10-CM | POA: Diagnosis not present

## 2019-03-09 DIAGNOSIS — F332 Major depressive disorder, recurrent severe without psychotic features: Secondary | ICD-10-CM | POA: Diagnosis present

## 2019-03-09 DIAGNOSIS — R402252 Coma scale, best verbal response, oriented, at arrival to emergency department: Secondary | ICD-10-CM | POA: Diagnosis present

## 2019-03-09 DIAGNOSIS — N19 Unspecified kidney failure: Secondary | ICD-10-CM | POA: Diagnosis not present

## 2019-03-09 DIAGNOSIS — Z801 Family history of malignant neoplasm of trachea, bronchus and lung: Secondary | ICD-10-CM | POA: Diagnosis not present

## 2019-03-09 DIAGNOSIS — I472 Ventricular tachycardia: Secondary | ICD-10-CM | POA: Diagnosis not present

## 2019-03-09 DIAGNOSIS — T50902A Poisoning by unspecified drugs, medicaments and biological substances, intentional self-harm, initial encounter: Secondary | ICD-10-CM | POA: Diagnosis present

## 2019-03-09 DIAGNOSIS — I248 Other forms of acute ischemic heart disease: Secondary | ICD-10-CM | POA: Diagnosis not present

## 2019-03-09 DIAGNOSIS — J810 Acute pulmonary edema: Secondary | ICD-10-CM | POA: Diagnosis not present

## 2019-03-09 DIAGNOSIS — G4700 Insomnia, unspecified: Secondary | ICD-10-CM

## 2019-03-09 DIAGNOSIS — J80 Acute respiratory distress syndrome: Secondary | ICD-10-CM | POA: Diagnosis not present

## 2019-03-09 DIAGNOSIS — J9600 Acute respiratory failure, unspecified whether with hypoxia or hypercapnia: Secondary | ICD-10-CM

## 2019-03-09 DIAGNOSIS — E8770 Fluid overload, unspecified: Secondary | ICD-10-CM | POA: Diagnosis not present

## 2019-03-09 DIAGNOSIS — K567 Ileus, unspecified: Secondary | ICD-10-CM | POA: Diagnosis not present

## 2019-03-09 DIAGNOSIS — Z20828 Contact with and (suspected) exposure to other viral communicable diseases: Secondary | ICD-10-CM | POA: Diagnosis present

## 2019-03-09 DIAGNOSIS — F329 Major depressive disorder, single episode, unspecified: Secondary | ICD-10-CM | POA: Diagnosis not present

## 2019-03-09 DIAGNOSIS — T424X2A Poisoning by benzodiazepines, intentional self-harm, initial encounter: Secondary | ICD-10-CM | POA: Diagnosis present

## 2019-03-09 DIAGNOSIS — I120 Hypertensive chronic kidney disease with stage 5 chronic kidney disease or end stage renal disease: Secondary | ICD-10-CM | POA: Diagnosis present

## 2019-03-09 DIAGNOSIS — G92 Toxic encephalopathy: Secondary | ICD-10-CM | POA: Diagnosis present

## 2019-03-09 LAB — GLUCOSE, CAPILLARY
Glucose-Capillary: 207 mg/dL — ABNORMAL HIGH (ref 70–99)
Glucose-Capillary: 224 mg/dL — ABNORMAL HIGH (ref 70–99)
Glucose-Capillary: 229 mg/dL — ABNORMAL HIGH (ref 70–99)
Glucose-Capillary: 232 mg/dL — ABNORMAL HIGH (ref 70–99)
Glucose-Capillary: 240 mg/dL — ABNORMAL HIGH (ref 70–99)
Glucose-Capillary: 245 mg/dL — ABNORMAL HIGH (ref 70–99)
Glucose-Capillary: 305 mg/dL — ABNORMAL HIGH (ref 70–99)
Glucose-Capillary: 313 mg/dL — ABNORMAL HIGH (ref 70–99)
Glucose-Capillary: 374 mg/dL — ABNORMAL HIGH (ref 70–99)
Glucose-Capillary: 392 mg/dL — ABNORMAL HIGH (ref 70–99)
Glucose-Capillary: 415 mg/dL — ABNORMAL HIGH (ref 70–99)
Glucose-Capillary: 431 mg/dL — ABNORMAL HIGH (ref 70–99)
Glucose-Capillary: 442 mg/dL — ABNORMAL HIGH (ref 70–99)
Glucose-Capillary: 445 mg/dL — ABNORMAL HIGH (ref 70–99)
Glucose-Capillary: 452 mg/dL — ABNORMAL HIGH (ref 70–99)
Glucose-Capillary: 456 mg/dL — ABNORMAL HIGH (ref 70–99)
Glucose-Capillary: 505 mg/dL (ref 70–99)

## 2019-03-09 LAB — URINALYSIS, COMPLETE (UACMP) WITH MICROSCOPIC
Bacteria, UA: NONE SEEN
Bilirubin Urine: NEGATIVE
Glucose, UA: NEGATIVE mg/dL
Hgb urine dipstick: NEGATIVE
Ketones, ur: NEGATIVE mg/dL
Leukocytes,Ua: NEGATIVE
Nitrite: NEGATIVE
Protein, ur: 30 mg/dL — AB
Specific Gravity, Urine: 1.021 (ref 1.005–1.030)
pH: 5 (ref 5.0–8.0)

## 2019-03-09 LAB — BLOOD GAS, ARTERIAL
Acid-base deficit: 16.2 mmol/L — ABNORMAL HIGH (ref 0.0–2.0)
Acid-base deficit: 9.7 mmol/L — ABNORMAL HIGH (ref 0.0–2.0)
Acid-base deficit: 9.9 mmol/L — ABNORMAL HIGH (ref 0.0–2.0)
Bicarbonate: 12.7 mmol/L — ABNORMAL LOW (ref 20.0–28.0)
Bicarbonate: 17.2 mmol/L — ABNORMAL LOW (ref 20.0–28.0)
Bicarbonate: 18 mmol/L — ABNORMAL LOW (ref 20.0–28.0)
FIO2: 0.4
FIO2: 0.4
FIO2: 0.4
MECHVT: 500 mL
MECHVT: 500 mL
MECHVT: 500 mL
Mechanical Rate: 16
O2 Saturation: 86.1 %
O2 Saturation: 87.2 %
O2 Saturation: 92.9 %
PEEP: 5 cmH2O
PEEP: 5 cmH2O
PEEP: 5 cmH2O
Patient temperature: 37
Patient temperature: 37
Patient temperature: 37
RATE: 15 resp/min
RATE: 15 resp/min
pCO2 arterial: 40 mmHg (ref 32.0–48.0)
pCO2 arterial: 41 mmHg (ref 32.0–48.0)
pCO2 arterial: 45 mmHg (ref 32.0–48.0)
pH, Arterial: 7.11 — CL (ref 7.350–7.450)
pH, Arterial: 7.21 — ABNORMAL LOW (ref 7.350–7.450)
pH, Arterial: 7.23 — ABNORMAL LOW (ref 7.350–7.450)
pO2, Arterial: 65 mmHg — ABNORMAL LOW (ref 83.0–108.0)
pO2, Arterial: 70 mmHg — ABNORMAL LOW (ref 83.0–108.0)
pO2, Arterial: 79 mmHg — ABNORMAL LOW (ref 83.0–108.0)

## 2019-03-09 LAB — COMPREHENSIVE METABOLIC PANEL
ALT: 36 U/L (ref 0–44)
ALT: 50 U/L — ABNORMAL HIGH (ref 0–44)
AST: 30 U/L (ref 15–41)
AST: 57 U/L — ABNORMAL HIGH (ref 15–41)
Albumin: 3.9 g/dL (ref 3.5–5.0)
Albumin: 4.7 g/dL (ref 3.5–5.0)
Alkaline Phosphatase: 60 U/L (ref 38–126)
Alkaline Phosphatase: 64 U/L (ref 38–126)
Anion gap: 11 (ref 5–15)
Anion gap: 12 (ref 5–15)
BUN: 11 mg/dL (ref 6–20)
BUN: 9 mg/dL (ref 6–20)
CO2: 17 mmol/L — ABNORMAL LOW (ref 22–32)
CO2: 21 mmol/L — ABNORMAL LOW (ref 22–32)
Calcium: 8.5 mg/dL — ABNORMAL LOW (ref 8.9–10.3)
Calcium: 9.1 mg/dL (ref 8.9–10.3)
Chloride: 106 mmol/L (ref 98–111)
Chloride: 108 mmol/L (ref 98–111)
Creatinine, Ser: 1.26 mg/dL — ABNORMAL HIGH (ref 0.61–1.24)
Creatinine, Ser: 2.09 mg/dL — ABNORMAL HIGH (ref 0.61–1.24)
GFR calc Af Amer: 48 mL/min — ABNORMAL LOW (ref >60)
GFR calc Af Amer: >60 mL/min (ref >60)
GFR calc non Af Amer: 41 mL/min — ABNORMAL LOW (ref >60)
GFR calc non Af Amer: >60 mL/min (ref >60)
Glucose, Bld: 136 mg/dL — ABNORMAL HIGH (ref 70–99)
Glucose, Bld: 271 mg/dL — ABNORMAL HIGH (ref 70–99)
Potassium: 2.7 mmol/L — CL (ref 3.5–5.1)
Potassium: 3.2 mmol/L — ABNORMAL LOW (ref 3.5–5.1)
Sodium: 135 mmol/L (ref 135–145)
Sodium: 140 mmol/L (ref 135–145)
Total Bilirubin: 0.8 mg/dL (ref 0.3–1.2)
Total Bilirubin: 1.1 mg/dL (ref 0.3–1.2)
Total Protein: 6.4 g/dL — ABNORMAL LOW (ref 6.5–8.1)
Total Protein: 7.7 g/dL (ref 6.5–8.1)

## 2019-03-09 LAB — RENAL FUNCTION PANEL
Albumin: 3.5 g/dL (ref 3.5–5.0)
Albumin: 3 g/dL — ABNORMAL LOW (ref 3.5–5.0)
Anion gap: 20 — ABNORMAL HIGH (ref 5–15)
Anion gap: 13 (ref 5–15)
BUN: 17 mg/dL (ref 6–20)
BUN: 23 mg/dL — ABNORMAL HIGH (ref 6–20)
CO2: 11 mmol/L — ABNORMAL LOW (ref 22–32)
CO2: 17 mmol/L — ABNORMAL LOW (ref 22–32)
Calcium: 8.2 mg/dL — ABNORMAL LOW (ref 8.9–10.3)
Calcium: 6.9 mg/dL — ABNORMAL LOW (ref 8.9–10.3)
Chloride: 101 mmol/L (ref 98–111)
Chloride: 102 mmol/L (ref 98–111)
Creatinine, Ser: 3.38 mg/dL — ABNORMAL HIGH (ref 0.61–1.24)
Creatinine, Ser: 4.16 mg/dL — ABNORMAL HIGH (ref 0.61–1.24)
GFR calc Af Amer: 27 mL/min — ABNORMAL LOW (ref >60)
GFR calc Af Amer: 21 mL/min — ABNORMAL LOW (ref >60)
GFR calc non Af Amer: 23 mL/min — ABNORMAL LOW (ref >60)
GFR calc non Af Amer: 18 mL/min — ABNORMAL LOW (ref >60)
Glucose, Bld: 443 mg/dL — ABNORMAL HIGH (ref 70–99)
Glucose, Bld: 543 mg/dL (ref 70–99)
Phosphorus: 8 mg/dL — ABNORMAL HIGH (ref 2.5–4.6)
Phosphorus: 5.8 mg/dL — ABNORMAL HIGH (ref 2.5–4.6)
Potassium: 4.8 mmol/L (ref 3.5–5.1)
Potassium: 5.2 mmol/L — ABNORMAL HIGH (ref 3.5–5.1)
Sodium: 132 mmol/L — ABNORMAL LOW (ref 135–145)
Sodium: 132 mmol/L — ABNORMAL LOW (ref 135–145)

## 2019-03-09 LAB — URINE DRUG SCREEN, QUALITATIVE (ARMC ONLY)
Amphetamines, Ur Screen: NOT DETECTED
Barbiturates, Ur Screen: NOT DETECTED
Benzodiazepine, Ur Scrn: POSITIVE — AB
Cannabinoid 50 Ng, Ur ~~LOC~~: NOT DETECTED
Cocaine Metabolite,Ur ~~LOC~~: NOT DETECTED
MDMA (Ecstasy)Ur Screen: NOT DETECTED
Methadone Scn, Ur: NOT DETECTED
Opiate, Ur Screen: NOT DETECTED
Phencyclidine (PCP) Ur S: NOT DETECTED
Tricyclic, Ur Screen: POSITIVE — AB

## 2019-03-09 LAB — MAGNESIUM
Magnesium: 1.6 mg/dL — ABNORMAL LOW (ref 1.7–2.4)
Magnesium: 1.8 mg/dL (ref 1.7–2.4)
Magnesium: 2 mg/dL (ref 1.7–2.4)
Magnesium: 2.3 mg/dL (ref 1.7–2.4)

## 2019-03-09 LAB — BASIC METABOLIC PANEL
Anion gap: 12 (ref 5–15)
BUN: 13 mg/dL (ref 6–20)
CO2: 16 mmol/L — ABNORMAL LOW (ref 22–32)
Calcium: 8.4 mg/dL — ABNORMAL LOW (ref 8.9–10.3)
Chloride: 106 mmol/L (ref 98–111)
Creatinine, Ser: 2.47 mg/dL — ABNORMAL HIGH (ref 0.61–1.24)
GFR calc Af Amer: 39 mL/min — ABNORMAL LOW (ref >60)
GFR calc non Af Amer: 34 mL/min — ABNORMAL LOW (ref >60)
Glucose, Bld: 288 mg/dL — ABNORMAL HIGH (ref 70–99)
Potassium: 3.2 mmol/L — ABNORMAL LOW (ref 3.5–5.1)
Sodium: 134 mmol/L — ABNORMAL LOW (ref 135–145)

## 2019-03-09 LAB — LACTIC ACID, PLASMA
Lactic Acid, Venous: 5.1 mmol/L (ref 0.5–1.9)
Lactic Acid, Venous: 7 mmol/L (ref 0.5–1.9)

## 2019-03-09 LAB — ECHOCARDIOGRAM COMPLETE
Height: 67.008 in
Weight: 3200 oz

## 2019-03-09 LAB — CBC
HCT: 37.9 % — ABNORMAL LOW (ref 39.0–52.0)
Hemoglobin: 12.6 g/dL — ABNORMAL LOW (ref 13.0–17.0)
MCH: 27.3 pg (ref 26.0–34.0)
MCHC: 33.2 g/dL (ref 30.0–36.0)
MCV: 82.2 fL (ref 80.0–100.0)
Platelets: 393 10*3/uL (ref 150–400)
RBC: 4.61 MIL/uL (ref 4.22–5.81)
RDW: 12.7 % (ref 11.5–15.5)
WBC: 32.7 10*3/uL — ABNORMAL HIGH (ref 4.0–10.5)
nRBC: 0 % (ref 0.0–0.2)

## 2019-03-09 LAB — ACETAMINOPHEN LEVEL: Acetaminophen (Tylenol), Serum: <10 ug/mL — ABNORMAL LOW (ref 10–30)

## 2019-03-09 LAB — LIPASE, BLOOD: Lipase: 30 U/L (ref 11–51)

## 2019-03-09 LAB — MRSA PCR SCREENING: MRSA by PCR: NEGATIVE

## 2019-03-09 LAB — PROCALCITONIN: Procalcitonin: 5.77 ng/mL

## 2019-03-09 LAB — ETHANOL: Alcohol, Ethyl (B): <10 mg/dL (ref <10)

## 2019-03-09 LAB — SALICYLATE LEVEL: Salicylate Lvl: <7 mg/dL (ref 2.8–30.0)

## 2019-03-09 LAB — TROPONIN I: Troponin I: <0.03 ng/mL (ref <0.03)

## 2019-03-09 LAB — SARS CORONAVIRUS 2 BY RT PCR (HOSPITAL ORDER, PERFORMED IN ~~LOC~~ HOSPITAL LAB): SARS Coronavirus 2: NEGATIVE

## 2019-03-09 LAB — PHOSPHORUS
Phosphorus: 1.5 mg/dL — ABNORMAL LOW (ref 2.5–4.6)
Phosphorus: <1 mg/dL — CL (ref 2.5–4.6)
Phosphorus: 5.9 mg/dL — ABNORMAL HIGH (ref 2.5–4.6)

## 2019-03-09 MED ORDER — ORAL CARE MOUTH RINSE
15.0000 mL | OROMUCOSAL | Status: DC
  Administered 2019-03-09 – 2019-03-12 (×33): 15 mL via OROMUCOSAL

## 2019-03-09 MED ORDER — FENTANYL CITRATE (PF) 100 MCG/2ML IJ SOLN
INTRAMUSCULAR | Status: AC
  Administered 2019-03-09: 50 ug via INTRAVENOUS
  Filled 2019-03-09: qty 2

## 2019-03-09 MED ORDER — LACTATED RINGERS IV BOLUS
1000.0000 mL | Freq: Once | INTRAVENOUS | Status: AC
  Administered 2019-03-09: 1000 mL via INTRAVENOUS

## 2019-03-09 MED ORDER — PANTOPRAZOLE SODIUM 40 MG IV SOLR
40.0000 mg | Freq: Every day | INTRAVENOUS | Status: DC
  Administered 2019-03-09: 40 mg via INTRAVENOUS
  Filled 2019-03-09: qty 40

## 2019-03-09 MED ORDER — INSULIN (MYXREDLIN) INFUSION FOR HYPERTRIGLYCERIDEMIA
1.0000 [IU]/kg/h | INTRAVENOUS | Status: DC
  Administered 2019-03-09 (×2): 1 [IU]/kg/h via INTRAVENOUS
  Filled 2019-03-09 (×6): qty 100

## 2019-03-09 MED ORDER — SODIUM BICARBONATE 8.4 % IV SOLN
INTRAVENOUS | Status: DC
  Filled 2019-03-09 (×2): qty 150

## 2019-03-09 MED ORDER — POTASSIUM CHLORIDE 10 MEQ/100ML IV SOLN
10.0000 meq | Freq: Once | INTRAVENOUS | Status: AC
  Administered 2019-03-09: 10 meq via INTRAVENOUS
  Filled 2019-03-09: qty 100

## 2019-03-09 MED ORDER — FENTANYL CITRATE (PF) 100 MCG/2ML IJ SOLN: 50.0000 ug | Freq: Once | INTRAMUSCULAR | Status: DC

## 2019-03-09 MED ORDER — SODIUM CHLORIDE 0.9% FLUSH: 10.0000 mL | INTRAVENOUS | Status: DC | PRN

## 2019-03-09 MED ORDER — SODIUM CHLORIDE 0.9% FLUSH
10.0000 mL | Freq: Two times a day (BID) | INTRAVENOUS | Status: DC
  Administered 2019-03-09 – 2019-03-13 (×8): 10 mL
  Administered 2019-03-14: 30 mL

## 2019-03-09 MED ORDER — FENTANYL CITRATE (PF) 100 MCG/2ML IJ SOLN
50.0000 ug | Freq: Once | INTRAMUSCULAR | Status: AC
  Administered 2019-03-09: 50 ug via INTRAVENOUS

## 2019-03-09 MED ORDER — NOREPINEPHRINE 4 MG/250ML-% IV SOLN
0.0000 ug/min | INTRAVENOUS | Status: DC
  Administered 2019-03-09: 30 ug/min via INTRAVENOUS
  Filled 2019-03-09: qty 250

## 2019-03-09 MED ORDER — INSULIN HIGH DOSE BOLUS VIA INFUSION (FOR BETA BLOCKER / CALCIUM CHANNEL BLOCKER OVERDOSE)
1.0000 [IU]/kg | Freq: Once | INTRAVENOUS | Status: DC
  Filled 2019-03-09: qty 91

## 2019-03-09 MED ORDER — LACTATED RINGERS IV BOLUS
1000.0000 mL | Freq: Once | INTRAVENOUS | Status: AC
  Administered 2019-03-09: 09:00:00 1000 mL via INTRAVENOUS

## 2019-03-09 MED ORDER — POTASSIUM CHLORIDE 2 MEQ/ML IV SOLN
INTRAVENOUS | Status: DC
  Administered 2019-03-09: 09:00:00 via INTRAVENOUS
  Filled 2019-03-09 (×3): qty 1000

## 2019-03-09 MED ORDER — CALCIUM GLUCONATE-NACL 1-0.675 GM/50ML-% IV SOLN
1.0000 g | Freq: Once | INTRAVENOUS | Status: AC
  Administered 2019-03-09: 1000 mg via INTRAVENOUS
  Filled 2019-03-09: qty 50

## 2019-03-09 MED ORDER — POTASSIUM PHOSPHATES 15 MMOLE/5ML IV SOLN
45.0000 mmol | Freq: Once | INTRAVENOUS | Status: AC
  Administered 2019-03-09: 45 mmol via INTRAVENOUS
  Filled 2019-03-09: qty 15

## 2019-03-09 MED ORDER — MIDAZOLAM HCL 2 MG/2ML IJ SOLN
INTRAMUSCULAR | Status: AC
  Administered 2019-03-09: 2 mg via INTRAVENOUS
  Filled 2019-03-09: qty 2

## 2019-03-09 MED ORDER — PUREFLOW DIALYSIS SOLUTION
INTRAVENOUS | Status: DC
  Administered 2019-03-09: 16:00:00 via INTRAVENOUS_CENTRAL

## 2019-03-09 MED ORDER — GLUCAGON HCL RDNA (DIAGNOSTIC) 1 MG IJ SOLR: 1.0000 mg | Freq: Once | INTRAMUSCULAR | Status: DC

## 2019-03-09 MED ORDER — NOREPINEPHRINE 4 MG/250ML-% IV SOLN
INTRAVENOUS | Status: AC
  Administered 2019-03-09: 30 ug/min via INTRAVENOUS
  Filled 2019-03-09: qty 250

## 2019-03-09 MED ORDER — SODIUM BICARBONATE 8.4 % IV SOLN
100.0000 meq | Freq: Once | INTRAVENOUS | Status: AC
  Administered 2019-03-09: 21:00:00 100 meq via INTRAVENOUS
  Filled 2019-03-09: qty 50

## 2019-03-09 MED ORDER — CHLORHEXIDINE GLUCONATE CLOTH 2 % EX PADS
6.0000 | MEDICATED_PAD | Freq: Every day | CUTANEOUS | Status: DC
  Administered 2019-03-09 – 2019-03-30 (×17): 6 via TOPICAL

## 2019-03-09 MED ORDER — SODIUM CHLORIDE 0.9 % IV BOLUS
1000.0000 mL | Freq: Once | INTRAVENOUS | Status: AC
  Administered 2019-03-09: 1000 mL via INTRAVENOUS

## 2019-03-09 MED ORDER — SODIUM BICARBONATE 8.4 % IV SOLN
100.0000 meq | Freq: Once | INTRAVENOUS | Status: AC
  Administered 2019-03-09: 100 meq via INTRAVENOUS

## 2019-03-09 MED ORDER — MIDAZOLAM HCL 2 MG/2ML IJ SOLN
2.0000 mg | INTRAMUSCULAR | Status: DC | PRN
  Administered 2019-03-09 – 2019-03-10 (×3): 2 mg via INTRAVENOUS
  Administered 2019-03-11: 4 mg via INTRAVENOUS
  Administered 2019-03-11 – 2019-03-12 (×3): 2 mg via INTRAVENOUS
  Filled 2019-03-09 (×2): qty 2
  Filled 2019-03-09: qty 4
  Filled 2019-03-09 (×4): qty 2

## 2019-03-09 MED ORDER — ENOXAPARIN SODIUM 40 MG/0.4ML ~~LOC~~ SOLN
40.0000 mg | SUBCUTANEOUS | Status: DC
  Administered 2019-03-10: 40 mg via SUBCUTANEOUS
  Filled 2019-03-09: qty 0.4

## 2019-03-09 MED ORDER — NOREPINEPHRINE 16 MG/250ML-% IV SOLN
0.0000 ug/min | INTRAVENOUS | Status: DC
  Administered 2019-03-09: 40 ug/min via INTRAVENOUS
  Administered 2019-03-09 (×2): 65 ug/min via INTRAVENOUS
  Administered 2019-03-09: 70 ug/min via INTRAVENOUS
  Administered 2019-03-09: 65 ug/min via INTRAVENOUS
  Administered 2019-03-10: 70 ug/min via INTRAVENOUS
  Administered 2019-03-10: 110 ug/min via INTRAVENOUS
  Administered 2019-03-10: 90 ug/min via INTRAVENOUS
  Administered 2019-03-10 (×2): 60 ug/min via INTRAVENOUS
  Administered 2019-03-11: 100 ug/min via INTRAVENOUS
  Administered 2019-03-11 (×2): 110 ug/min via INTRAVENOUS
  Administered 2019-03-11: 70 ug/min via INTRAVENOUS
  Administered 2019-03-11: 100 ug/min via INTRAVENOUS
  Administered 2019-03-11: 110 ug/min via INTRAVENOUS
  Administered 2019-03-11: 70 ug/min via INTRAVENOUS
  Administered 2019-03-11: 100 ug/min via INTRAVENOUS
  Administered 2019-03-12: 55 ug/min via INTRAVENOUS
  Administered 2019-03-12: 80 ug/min via INTRAVENOUS
  Administered 2019-03-12: 50 ug/min via INTRAVENOUS
  Administered 2019-03-12: 65 ug/min via INTRAVENOUS
  Administered 2019-03-12: 100 ug/min via INTRAVENOUS
  Administered 2019-03-13: 40 ug/min via INTRAVENOUS
  Administered 2019-03-13 (×2): 55 ug/min via INTRAVENOUS
  Filled 2019-03-09 (×27): qty 250

## 2019-03-09 MED ORDER — DEXTROSE 10 % IV SOLN
5.0000 mL/kg/h | INTRAVENOUS | Status: DC
  Administered 2019-03-09 (×2): 5 mL/kg/h via INTRAVENOUS

## 2019-03-09 MED ORDER — SODIUM CHLORIDE 0.9 % IV SOLN
0.0000 ug/min | INTRAVENOUS | Status: DC
  Administered 2019-03-09 (×3): 400 ug/min via INTRAVENOUS
  Administered 2019-03-10: 200 ug/min via INTRAVENOUS
  Administered 2019-03-10 (×6): 400 ug/min via INTRAVENOUS
  Administered 2019-03-10: 75 ug/min via INTRAVENOUS
  Administered 2019-03-10: 400 ug/min via INTRAVENOUS
  Administered 2019-03-11: 75 ug/min via INTRAVENOUS
  Administered 2019-03-11: 200 ug/min via INTRAVENOUS
  Administered 2019-03-11: 125 ug/min via INTRAVENOUS
  Administered 2019-03-11: 200 ug/min via INTRAVENOUS
  Administered 2019-03-12: 60 ug/min via INTRAVENOUS
  Filled 2019-03-09: qty 40
  Filled 2019-03-09: qty 4
  Filled 2019-03-09 (×3): qty 40
  Filled 2019-03-09 (×5): qty 4
  Filled 2019-03-09: qty 40
  Filled 2019-03-09 (×3): qty 4
  Filled 2019-03-09 (×3): qty 40

## 2019-03-09 MED ORDER — SODIUM CHLORIDE 0.9 % IV SOLN: 1.0000 g | Freq: Once | INTRAVENOUS | Status: DC

## 2019-03-09 MED ORDER — BISACODYL 10 MG RE SUPP: 10.0000 mg | Freq: Every day | RECTAL | Status: DC | PRN

## 2019-03-09 MED ORDER — VASOPRESSIN 20 UNIT/ML IV SOLN
0.0300 [IU]/min | INTRAVENOUS | Status: DC
  Administered 2019-03-09: 0.06 [IU]/min via INTRAVENOUS
  Administered 2019-03-09 – 2019-03-10 (×2): 0.03 [IU]/min via INTRAVENOUS
  Administered 2019-03-10: 0.06 [IU]/min via INTRAVENOUS
  Administered 2019-03-11 – 2019-03-12 (×2): 0.03 [IU]/min via INTRAVENOUS
  Filled 2019-03-09 (×5): qty 2

## 2019-03-09 MED ORDER — DEXMEDETOMIDINE HCL IN NACL 400 MCG/100ML IV SOLN
0.4000 ug/kg/h | INTRAVENOUS | Status: DC
  Administered 2019-03-09: 0.6 ug/kg/h via INTRAVENOUS
  Administered 2019-03-09: 1 ug/kg/h via INTRAVENOUS
  Filled 2019-03-09: qty 100

## 2019-03-09 MED ORDER — ONDANSETRON HCL 4 MG/2ML IJ SOLN
4.0000 mg | Freq: Once | INTRAMUSCULAR | Status: AC
  Administered 2019-03-09: 4 mg via INTRAVENOUS

## 2019-03-09 MED ORDER — SENNOSIDES 8.8 MG/5ML PO SYRP
5.0000 mL | ORAL_SOLUTION | Freq: Two times a day (BID) | ORAL | Status: DC | PRN
  Administered 2019-03-14: 5 mL
  Filled 2019-03-09 (×2): qty 5

## 2019-03-09 MED ORDER — MAGNESIUM SULFATE 2 GM/50ML IV SOLN
2.0000 g | Freq: Once | INTRAVENOUS | Status: AC
  Administered 2019-03-09: 2 g via INTRAVENOUS
  Filled 2019-03-09: qty 50

## 2019-03-09 MED ORDER — FENTANYL 2500MCG IN NS 250ML (10MCG/ML) PREMIX INFUSION
0.0000 ug/h | INTRAVENOUS | Status: DC
  Administered 2019-03-09: 100 ug/h via INTRAVENOUS
  Administered 2019-03-10: 125 ug/h via INTRAVENOUS
  Administered 2019-03-10: 200 ug/h via INTRAVENOUS
  Administered 2019-03-11 – 2019-03-12 (×4): 300 ug/h via INTRAVENOUS
  Filled 2019-03-09 (×6): qty 250

## 2019-03-09 MED ORDER — EPINEPHRINE PF 1 MG/ML IJ SOLN
0.5000 ug/min | INTRAVENOUS | Status: DC
  Administered 2019-03-09: 0.5 ug/min via INTRAVENOUS
  Administered 2019-03-10: 20 ug/min via INTRAVENOUS
  Administered 2019-03-10: 15 ug/min via INTRAVENOUS
  Filled 2019-03-09 (×3): qty 8

## 2019-03-09 MED ORDER — ENOXAPARIN SODIUM 40 MG/0.4ML ~~LOC~~ SOLN
40.0000 mg | SUBCUTANEOUS | Status: DC
  Administered 2019-03-09: 40 mg via SUBCUTANEOUS
  Filled 2019-03-09: qty 0.4

## 2019-03-09 MED ORDER — INSULIN REGULAR(HUMAN) IN NACL 100-0.9 UT/100ML-% IV SOLN
INTRAVENOUS | Status: DC
  Administered 2019-03-09: 3.2 [IU]/h via INTRAVENOUS
  Administered 2019-03-10: 36.7 [IU]/h via INTRAVENOUS
  Administered 2019-03-10: 25.1 [IU]/h via INTRAVENOUS
  Administered 2019-03-10: 20 [IU]/h via INTRAVENOUS
  Filled 2019-03-09 (×5): qty 100

## 2019-03-09 MED ORDER — CHARCOAL ACTIVATED PO LIQD
50.0000 g | Freq: Once | ORAL | Status: AC
  Administered 2019-03-09: 50 g via ORAL
  Filled 2019-03-09: qty 240

## 2019-03-09 MED ORDER — GLUCAGON HCL RDNA (DIAGNOSTIC) 1 MG IJ SOLR
1.0000 mg | Freq: Once | INTRAMUSCULAR | Status: AC
  Administered 2019-03-09: 1 mg via INTRAVENOUS
  Filled 2019-03-09: qty 1

## 2019-03-09 MED ORDER — GLUCAGON HCL RDNA (DIAGNOSTIC) 1 MG IJ SOLR
3.0000 mg | Freq: Once | INTRAMUSCULAR | Status: AC
  Administered 2019-03-09: 3 mg via INTRAVENOUS
  Filled 2019-03-09: qty 3

## 2019-03-09 MED ORDER — FENTANYL BOLUS VIA INFUSION
50.0000 ug | INTRAVENOUS | Status: DC | PRN
  Administered 2019-03-10: 50 ug via INTRAVENOUS
  Filled 2019-03-09: qty 50

## 2019-03-09 MED ORDER — SODIUM CHLORIDE 0.9 % IV SOLN
0.0000 [IU]/kg/h | INTRAVENOUS | Status: DC
  Filled 2019-03-09: qty 10

## 2019-03-09 MED ORDER — PANTOPRAZOLE SODIUM 40 MG IV SOLR
40.0000 mg | Freq: Two times a day (BID) | INTRAVENOUS | Status: DC
  Administered 2019-03-09 – 2019-03-14 (×12): 40 mg via INTRAVENOUS
  Filled 2019-03-09 (×12): qty 40

## 2019-03-09 MED ORDER — DEXTROSE 10 % IV SOLN
INTRAVENOUS | Status: DC
  Administered 2019-03-09: 07:00:00 via INTRAVENOUS

## 2019-03-09 MED ORDER — HEPARIN SODIUM (PORCINE) 1000 UNIT/ML DIALYSIS
1000.0000 [IU] | INTRAMUSCULAR | Status: DC | PRN
  Administered 2019-03-13: 2600 [IU] via INTRAVENOUS_CENTRAL
  Administered 2019-03-13: 3000 [IU] via INTRAVENOUS_CENTRAL
  Administered 2019-03-14: 4000 [IU] via INTRAVENOUS_CENTRAL
  Filled 2019-03-09 (×7): qty 6
  Filled 2019-03-09: qty 4

## 2019-03-09 MED ORDER — POTASSIUM CHLORIDE 10 MEQ/100ML IV SOLN
10.0000 meq | INTRAVENOUS | Status: AC
  Administered 2019-03-09 (×3): 10 meq via INTRAVENOUS
  Filled 2019-03-09 (×3): qty 100

## 2019-03-09 MED ORDER — SODIUM CHLORIDE 0.9 % IV SOLN
0.0000 ug/min | INTRAVENOUS | Status: DC
  Administered 2019-03-09: 20 ug/min via INTRAVENOUS
  Filled 2019-03-09: qty 10

## 2019-03-09 MED ORDER — MIDAZOLAM HCL 2 MG/2ML IJ SOLN
2.0000 mg | INTRAMUSCULAR | Status: AC | PRN
  Administered 2019-03-09 (×3): 2 mg via INTRAVENOUS
  Filled 2019-03-09 (×2): qty 2

## 2019-03-09 MED ORDER — POTASSIUM CHLORIDE CRYS ER 20 MEQ PO TBCR
40.0000 meq | EXTENDED_RELEASE_TABLET | Freq: Once | ORAL | Status: AC
  Administered 2019-03-09: 02:00:00 40 meq via ORAL
  Filled 2019-03-09: qty 2

## 2019-03-09 MED ORDER — FENTANYL 2500MCG IN NS 250ML (10MCG/ML) PREMIX INFUSION
INTRAVENOUS | Status: AC
  Administered 2019-03-09: 100 ug/h via INTRAVENOUS
  Filled 2019-03-09: qty 250

## 2019-03-09 MED ORDER — DEXTROSE 50 % IV SOLN: 0.5000 g/kg | Freq: Once | INTRAVENOUS | Status: DC | PRN

## 2019-03-09 MED ORDER — IPRATROPIUM-ALBUTEROL 0.5-2.5 (3) MG/3ML IN SOLN
3.0000 mL | Freq: Four times a day (QID) | RESPIRATORY_TRACT | Status: DC
  Administered 2019-03-09 – 2019-03-15 (×24): 3 mL via RESPIRATORY_TRACT
  Filled 2019-03-09 (×24): qty 3

## 2019-03-09 MED ORDER — PIPERACILLIN-TAZOBACTAM 3.375 G IVPB
3.3750 g | Freq: Three times a day (TID) | INTRAVENOUS | Status: DC
  Administered 2019-03-09 – 2019-03-11 (×7): 3.375 g via INTRAVENOUS
  Filled 2019-03-09 (×7): qty 50

## 2019-03-09 MED ORDER — CHLORHEXIDINE GLUCONATE 0.12% ORAL RINSE (MEDLINE KIT)
15.0000 mL | Freq: Two times a day (BID) | OROMUCOSAL | Status: DC
  Administered 2019-03-09 – 2019-03-12 (×8): 15 mL via OROMUCOSAL
  Filled 2019-03-09: qty 15

## 2019-03-09 MED ORDER — SODIUM CHLORIDE 0.9 % IV SOLN: 0.0000 [IU]/kg/h | INTRAVENOUS | Status: DC

## 2019-03-09 MED ORDER — GLUCAGON HCL RDNA (DIAGNOSTIC) 1 MG IJ SOLR
1.0000 mg/h | INTRAVENOUS | Status: DC
  Administered 2019-03-09 – 2019-03-10 (×5): 1 mg/h via INTRAVENOUS
  Filled 2019-03-09 (×5): qty 5

## 2019-03-09 MED ORDER — ONDANSETRON HCL 4 MG/2ML IJ SOLN
INTRAMUSCULAR | Status: AC
  Administered 2019-03-09: 4 mg via INTRAVENOUS
  Filled 2019-03-09: qty 2

## 2019-03-09 MED ORDER — POTASSIUM CHLORIDE 10 MEQ/50ML IV SOLN
10.0000 meq | INTRAVENOUS | Status: DC
  Administered 2019-03-09 (×3): 10 meq via INTRAVENOUS
  Filled 2019-03-09 (×6): qty 50

## 2019-03-09 MED ORDER — POTASSIUM PHOSPHATES 15 MMOLE/5ML IV SOLN: 30.0000 mmol | Freq: Once | INTRAVENOUS | Status: DC

## 2019-03-09 NOTE — Consult Note (Signed)
PULMONARY / CRITICAL CARE MEDICINE  Name: Glenn Rasmussen MRN: [id number] DOB: 09-09-1991    LOS: 0  Referring Provider: Dr. Jannifer [facility] Reason for Referral: Overdose Brief patient description: 28 year old male admitted with overdose on benzodiazepine, calcium channel blocker, and Phenergan.  Upon arrival in the ICU, patient had multiple episodes of emesis with overt aspiration.  Emergently intubated for airway protection  HPI: This is a 28 year old male with a medical history as indicated below who presented to the ED after taking 38 tablets of Phenergan, 19 tablets of losartan, 42 tablets of amlodipine, and 78 tablets of clonazepam.  When EMS arrived, patient's blood pressure was 91/81.Marland Kitchen  History is obtained from ED records as patient is currently intubated and sedated.  At the ED, patient reported that he was feeling overwhelmed and suicidal when he took the medications.  Patient indicated he counted the pills before taking them.  He texted his friends and said goodbye after taking the medications.  His urine toxicology was positive for benzos and tricyclics.  His blood alcohol and salicylate levels were normal.  He was admitted to the ICU for further management. Upon arrival in the ICU, patient became agitated requiring Precedex infusion.  He then had  multiple episodes of emesis with overt aspiration.  He was emergently intubated for airway protection by the ED attending Dr. Les Pou.  Past Medical History:  Diagnosis Date  . Hypertension   . IBS (irritable bowel syndrome)    Past Surgical History:  Procedure Laterality Date  . APPENDECTOMY    . CHOLECYSTECTOMY    . TONSILLECTOMY     No current facility-administered medications on file prior to encounter.    Current Outpatient Medications on File Prior to Encounter  Medication Sig  . amLODipine-valsartan (EXFORGE) 10-320 MG tablet Take 1 tablet by mouth daily.  . clonazePAM (KLONOPIN) 0.5 MG tablet TK 1 T PO BID UTD  .  desipramine (NOPRAMIN) 150 MG tablet Take 150 mg by mouth at bedtime.  . dicyclomine (BENTYL) 20 MG tablet Take 20 mg by mouth every 6 (six) hours.  . eszopiclone (LUNESTA) 1 MG TABS tablet Take 1 mg by mouth at bedtime as needed for sleep. Take immediately before bedtime  . hydrochlorothiazide (HYDRODIURIL) 12.5 MG tablet Take 12.5 mg by mouth daily.  Marland Kitchen lisdexamfetamine (VYVANSE) 30 MG capsule TK 1 C PO QD UTD  . losartan (COZAAR) 100 MG tablet Take by mouth.  . oxyCODONE-acetaminophen (PERCOCET/ROXICET) 5-325 MG tablet Take 1 tablet by mouth every 8 (eight) hours as needed for severe pain.  . promethazine (PHENERGAN) 25 MG tablet Take 25 mg by mouth every 6 (six) hours as needed for nausea or vomiting.  . traZODone (DESYREL) 100 MG tablet TK 2 AND 1/2 TS PO HS PRN    Allergies No Known Allergies  Family History Family History  Problem Relation Age of Onset  . Lung cancer Mother   . Lung cancer Father    Social History  reports that he has never smoked. He has never used smokeless tobacco. He reports that he does not drink alcohol or use drugs.  Review Of Systems: Unable to obtain as patient is currently intubated and sedated  VITAL SIGNS: BP (!) 81/33   Pulse 91   Temp 98.4 F (36.9 C) (Axillary)   Resp (!) 21   Ht 5\' 7"  (1.702 m)   Wt 90.7 kg   SpO2 98%   BMI 31.32 kg/m   HEMODYNAMICS:    VENTILATOR SETTINGS:  INTAKE / OUTPUT: No intake/output data recorded.  PHYSICAL EXAMINATION: General: Well-nourished, well-developed, in no acute distress HEENT: PERRLA, trachea midline, no JVD, oral mucosa with copious amounts of secretions Neuro: Somnolent, agitated with arousable, confused, gag reflex intact, corneal reflexes intact Cardiovascular: Apical pulse regular, S1-S2, no murmur regurg or gallop, +2 pulses bilaterally, no edema Lungs: Bilateral breath sounds, diminished in the right lung base with diffuse crackles  Abdomen: Nondistended, normal bowel sounds in all  4 quadrants, palpation reveals no organomegaly Musculoskeletal: No joint deformities, positive range of motion Skin: Warm and dry, no rash or lesions  LABS:  BMET Recent Labs  Lab 03/08/19 2333  NA 140  K 3.2*  CL 108  CO2 21*  BUN 9  CREATININE 1.26*  GLUCOSE 136*    Electrolytes Recent Labs  Lab 03/08/19 2333  CALCIUM 9.1  MG 2.0    CBC Recent Labs  Lab 03/08/19 2333  WBC 14.8*  HGB 14.3  HCT 41.8  PLT 406*    Coag's Recent Labs  Lab 03/08/19 2333  INR 0.9    Sepsis Markers No results for input(s): LATICACIDVEN, PROCALCITON, O2SATVEN in the last 168 hours.  ABG No results for input(s): PHART, PCO2ART, PO2ART in the last 168 hours.  Liver Enzymes Recent Labs  Lab 03/08/19 2333  AST 30  ALT 36  ALKPHOS 64  BILITOT 0.8  ALBUMIN 4.7    Cardiac Enzymes Recent Labs  Lab 03/08/19 2333  TROPONINI <0.03    Glucose Recent Labs  Lab 03/09/19 0241 03/09/19 0353  GLUCAP 229* 207*    Imaging No results found.   STUDIES:  None  CULTURES: Sputum culture  ANTIBIOTICS: Zosyn for aspiration  SIGNIFICANT EVENTS: 03/09/2019: Admitted  LINES/TUBES: Peripheral IVs Foley catheter Right femoral triple-lumen catheter Right femoral A-line  DISCUSSION: 28 year old male presenting with shock secondary to overdose, acute respiratory failure secondary to aspiration, acute renal failure, severe hypokalemia, hypophosphatemia and elevated LFTs.  ASSESSMENT / PLAN:  PULMONARY A: Acute hypoxic respiratory failure due to aspiration Aspiration pneumonitis P:   Intubated for airway protection Continue full vent support with current settings ABG post intubation Chest x-ray reviewed-ET tube in optimal position Chest x-ray and ABG PRN Weaning trials as tolerated  CARDIOVASCULAR A:  Severe shock secondary to overdose on losartan and amlodipine P:  Flasher aware and following PRN glucagon IV fluids and pressors to maintain  mean arterial pressure greater than 65 Insulin, and dextrose per [facility] recommendations Blood glucose monitoring every hour Invasive blood pressure monitoring EKG now and daily as needed RENAL A:   Acute renal failure Hypokalemia Hypophosphatemia P:   IV hydration Trend creatinine Monitor for urinary retention due to Phenergan overdose Foley to gravity Monitor and correct electrolytes Strict I's and O's  GASTROINTESTINAL A:   Elevated LFTs due to overdose Nausea and vomiting IBS P:   Trend LFTs Zofran 4 mg every 6 hours as needed for nausea and vomiting  INFECTIOUS A:   Leukocytosis Aspiration pneumonia P:   Antibiotics as above Sputum culture Monitor fever curve  NEUROLOGIC A:   Acute encephalopathy  secondary to overdose P:   RASS goal: 0 to -1 Fentanyl, Versed and Precedex for vent sedation and discomfort Work-up assessment per protocol Monitor neurological status closely  Best Practice: Code Status: Full code Diet: N.p.o. GI prophylaxis: Protonix VTE prophylaxis: Lovenox and SCDs  FAMILY  - Updates: Patient's mother updated on patient's intubation.  Glenn Rasmussen ANP-BC Pulmonary and Critical Care Medicine [place]  [facility] Pager [phone] or [phone]  NB: This document was prepared using Dragon voice recognition software and may include unintentional dictation errors.    03/09/2019, 4:15 AM

## 2019-03-09 NOTE — Progress Notes (Signed)
Patient accepted from [facility]. Patient sedated on vent.  Blood pressure unstable levophed titrated per MD order.

## 2019-03-09 NOTE — Progress Notes (Addendum)
15:15 CRRT started per policy and procedure.  CRRT alarming from initial start.  High access and venous pressures alarms continue after multiple attempts to trouble shoot. Dr. Vanetta Mulders notified.

## 2019-03-09 NOTE — Procedures (Signed)
Central Venous Catheter Insertion Procedure Note Glenn Rasmussen [id number] 1991-08-17  Procedure: Insertion of Central Venous Catheter Indications: Assessment of intravascular volume, Drug and/or fluid administration and Frequent blood sampling  Procedure Details Consent: Unable to obtain consent because of emergent medical necessity. Time Out: Verified patient identification, verified procedure, site/side was marked, verified correct patient position, special equipment/implants available, medications/allergies/relevent history reviewed, required imaging and test results available.  Performed  Maximum sterile technique was used including antiseptics, cap, gloves, gown, hand hygiene, mask and sheet. Skin prep: Chlorhexidine; local anesthetic administered A antimicrobial bonded/coated triple lumen catheter was placed in the left femoral vein due to emergent situation using the Seldinger technique.  Evaluation Blood flow good Complications: No apparent complications Patient did tolerate procedure well. Chest X-ray ordered to verify placement.  CXR: not indicated for femoral site. Procedure performed under direct supervision of Dr. Lanney Gins. Ultrasound utilized for realtime vessel cannulation  Glenn Rasmussen 03/09/2019, 7:14 AM

## 2019-03-09 NOTE — Progress Notes (Signed)
MEDICATION RELATED CONSULT NOTE - INITIAL   Pharmacy Consult for insulin drip monitoring Indication: calcium channel blocker overdose  No Known Allergies  Patient Measurements: Height: 5' 7.01" (170.2 cm) Weight: 200 lb (90.7 kg) IBW/kg (Calculated) : 66.12  Vital Signs: Temp: 98.4 F (36.9 C) (06/01 0245) Temp Source: Axillary (06/01 0245) BP: 81/33 (06/01 0200) Pulse Rate: 91 (06/01 0200) Intake/Output from previous day: No intake/output data recorded. Intake/Output from this shift: No intake/output data recorded.  Labs: Recent Labs    03/08/19 2333 03/09/19 0457  WBC 14.8* 32.7*  HGB 14.3 12.6*  HCT 41.8 37.9*  PLT 406* 393  CREATININE 1.26* 2.09*  MG 2.0 1.8  PHOS  --  1.5*  ALBUMIN 4.7 3.9  PROT 7.7 6.4*  AST 30 57*  ALT 36 50*  ALKPHOS 64 60  BILITOT 0.8 1.1   Estimated Creatinine Clearance: 55.5 mL/min (A) (by C-G formula based on SCr of 2.09 mg/dL (H)).   Microbiology: Recent Results (from the past 720 hour(s))  SARS Coronavirus 2 (CEPHEID - Performed in [place] hospital lab), Hosp Order     Status: None   Collection Time: 03/08/19 11:48 PM  Result Value Ref Range Status   SARS Coronavirus 2 NEGATIVE NEGATIVE Final    Comment: (NOTE) If result is NEGATIVE SARS-CoV-2 target nucleic acids are NOT DETECTED. The SARS-CoV-2 RNA is generally detectable in upper and lower  respiratory specimens during the acute phase of infection. The lowest  concentration of SARS-CoV-2 viral copies this assay can detect is 250  copies / mL. A negative result does not preclude SARS-CoV-2 infection  and should not be used as the sole basis for treatment or other  patient management decisions.  A negative result may occur with  improper specimen collection / handling, submission of specimen other  than nasopharyngeal swab, presence of viral mutation(s) within the  areas targeted by this assay, and inadequate number of viral copies  (<250 copies / mL). A negative  result must be combined with clinical  observations, patient history, and epidemiological information. If result is POSITIVE SARS-CoV-2 target nucleic acids are DETECTED. The SARS-CoV-2 RNA is generally detectable in upper and lower  respiratory specimens dur ing the acute phase of infection.  Positive  results are indicative of active infection with SARS-CoV-2.  Clinical  correlation with patient history and other diagnostic information is  necessary to determine patient infection status.  Positive results do  not rule out bacterial infection or co-infection with other viruses. If result is PRESUMPTIVE POSTIVE SARS-CoV-2 nucleic acids MAY BE PRESENT.   A presumptive positive result was obtained on the submitted specimen  and confirmed on repeat testing.  While 2019 novel coronavirus  (SARS-CoV-2) nucleic acids may be present in the submitted sample  additional confirmatory testing may be necessary for epidemiological  and / or clinical management purposes  to differentiate between  SARS-CoV-2 and other Sarbecovirus currently known to infect humans.  If clinically indicated additional testing with an alternate test  methodology [id number]) is advised. The SARS-CoV-2 RNA is generally  detectable in upper and lower respiratory sp ecimens during the acute  phase of infection. The expected result is Negative. Fact Sheet for Patients:  StrictlyIdeas.no Fact Sheet for Healthcare Providers: BankingDealers.co.za This test is not yet approved or cleared by the [place] FDA and has been authorized for detection and/or diagnosis of SARS-CoV-2 by FDA under an Emergency Use Authorization (EUA).  This EUA will remain in effect (meaning this test can be used)  for the duration of the COVID-19 declaration under Section 564(b)(1) of the Act, 21 U.S.C. section 360bbb-3(b)(1), unless the authorization is terminated or revoked sooner. Performed at  [facility], [address] [place], [place] [address]   MRSA PCR Screening     Status: None   Collection Time: 03/09/19  2:48 AM  Result Value Ref Range Status   MRSA by PCR NEGATIVE NEGATIVE Final    Comment:        The GeneXpert MRSA Assay (FDA approved for NASAL specimens only), is one component of a comprehensive MRSA colonization surveillance program. It is not intended to diagnose MRSA infection nor to guide or monitor treatment for MRSA infections. Performed at [facility], [address] [place], [place] [address]     Medical History: Past Medical History:  Diagnosis Date  . Hypertension   . IBS (irritable bowel syndrome)     Medications:  Scheduled:  . chlorhexidine gluconate (MEDLINE KIT)  15 mL Mouth Rinse BID  . Chlorhexidine Gluconate Cloth  6 each Topical Q0600  . enoxaparin (LOVENOX) injection  40 mg Subcutaneous Q24H  . fentaNYL (SUBLIMAZE) injection  50 mcg Intravenous Once  . ipratropium-albuterol  3 mL Nebulization Q6H  . mouth rinse  15 mL Mouth Rinse 10 times per day  . ondansetron      . pantoprazole (PROTONIX) IV  40 mg Intravenous Daily    Assessment: Patient admitted for overdose d/t SI, patient says he was " feeling overwhelmed" and states he took 38 phenergan, 19 losartan, 42 amlodipine, and 78 clonazepam. Med rec states he takes exforge which is amlodipine w/ combo valsartan.   On arrival patient is tachycardic and hypotensive w/ systolics of 79 - 85.  Patient's initial K is 3.2, Glu 136, initial Scr is 1.26 (no previous to compare).   Goal of Therapy:  Reversal and/or prevention of CCB complications including hypotension, bradycardia, hyperglycemia and/or hypoglycemia s/t insulin therapy. Normalization of K >4.0 considering patient is on insulin infusion Normalization of electrolytes  Plan:  06/01 @ 0500 K 2.7; phos 1.5 patient ordered KCl 10 mEq IV x 6 via central line and Kphos 45 mmol IV x 1 and will recheck  phos w/ am labs and K w/ q4h labs. Insulin still running @ 1 unit/kg/hr w/ CBGs 271, 313 w/ D10 running @ 200 ml/hr.  Tobie Lords, PharmD, BCPS Clinical Pharmacist 03/09/2019,7:12 AM

## 2019-03-09 NOTE — Procedures (Signed)
Hemodialysis Catheter Insertion Procedure Note CALIN FANTROY [id number] 01/04/1991  Procedure: Insertion of Hemodialysis Catheter Indications: Dialysis Access   Procedure Details Consent: Risks of procedure as well as the alternatives and risks of each were explained to the (patient/caregiver).  Consent for procedure obtained. Time Out: Verified patient identification, verified procedure, site/side was marked, verified correct patient position, special equipment/implants available, medications/allergies/relevent history reviewed, required imaging and test results available.  Performed  Maximum sterile technique was used including antiseptics, cap, gloves, gown, hand hygiene, mask and sheet. Skin prep: Chlorhexidine; local anesthetic administered Double lumen hemodialysis catheter was inserted into left internal jugular vein using the Seldinger technique.  Evaluation Blood flow good Complications: No apparent complications Patient did tolerate procedure well. Chest X-ray ordered to verify placement.  CXR: normal.   Left internal jugular temporary dual lumen dialysis catheter placed utilizing ultrasound no complications noted during or following procedure.  Marda Stalker, [place] Pager [phone] (please enter 7 digits) PCCM Consult Pager [phone] (please enter 7 digits)

## 2019-03-09 NOTE — ED Notes (Signed)
ED TO INPATIENT HANDOFF REPORT  ED Nurse Name and Phone #: Wells Guiles [phone]  S Name/Age/Gender Horace Porteous 28 y.o. male Room/Bed: ED09A/ED09A  Code Status   Code Status: Not on file  Home/SNF/Other Home Patient oriented to: self, place, time and situation Is this baseline? Yes   Triage Complete: Triage complete  Chief Complaint overdose  Triage Note Pt arrives via [facility] with c/o OD due to SI thoughts. Pt states thatt he was "feeling overwhelmed" and pt is A/O at this time. Pt reports taking 38 Phenergan, 19 Losartan, 42 Amlodipine, 78 Clonipine. Per EMS, pt's last BP was 91/81. Pt states that he is not SI at this time.    Allergies No Known Allergies  Level of Care/Admitting Diagnosis ED Disposition    ED Disposition Condition [facility] Area: [place] [[id number]]  Level of Care: ICU [6]  Covid Evaluation: Screening Protocol (No Symptoms)  Diagnosis: Multiple drug overdose [[id number]]  Admitting Physician: Lance Coon [[id number]]  Attending Physician: Lance Coon [phone]  Estimated length of stay: past midnight tomorrow  Certification:: I certify this patient will need inpatient services for at least 2 midnights  PT Class (Do Not Modify): Inpatient [101]  PT Acc Code (Do Not Modify): Private [1]       B Medical/Surgery History Past Medical History:  Diagnosis Date  . Hypertension   . IBS (irritable bowel syndrome)    Past Surgical History:  Procedure Laterality Date  . APPENDECTOMY    . CHOLECYSTECTOMY    . TONSILLECTOMY       A IV Location/Drains/Wounds Patient Lines/Drains/Airways Status   Active Line/Drains/Airways    Name:   Placement date:   Placement time:   Site:   Days:   Peripheral IV 03/09/19 Right Hand   03/09/19    0000    Hand   less than 1   Peripheral IV 03/09/19 Left Hand   03/09/19    0033    Hand   less than 1   Peripheral IV 03/09/19 Left Forearm   03/09/19    0200    Forearm   less than 1   Urethral Catheter Crystalmarie Yasin RN Non-latex 14 Fr.   03/09/19    0203    Non-latex   less than 1          Intake/Output Last 24 hours No intake or output data in the 24 hours ending 03/09/19 0210  Labs/Imaging Results for orders placed or performed during the hospital encounter of 03/08/19 (from the past 48 hour(s))  Comprehensive metabolic panel     Status: Abnormal   Collection Time: 03/08/19 11:33 PM  Result Value Ref Range   Sodium 140 135 - 145 mmol/L   Potassium 3.2 (L) 3.5 - 5.1 mmol/L    Comment: HEMOLYSIS AT THIS LEVEL MAY AFFECT RESULT   Chloride 108 98 - 111 mmol/L   CO2 21 (L) 22 - 32 mmol/L   Glucose, Bld 136 (H) 70 - 99 mg/dL   BUN 9 6 - 20 mg/dL   Creatinine, Ser 1.26 (H) 0.61 - 1.24 mg/dL   Calcium 9.1 8.9 - 10.3 mg/dL   Total Protein 7.7 6.5 - 8.1 g/dL   Albumin 4.7 3.5 - 5.0 g/dL   AST 30 15 - 41 U/L   ALT 36 0 - 44 U/L   Alkaline Phosphatase 64 38 - 126 U/L   Total Bilirubin 0.8 0.3 - 1.2 mg/dL   GFR calc non Af Amer >  60 >60 mL/min   GFR calc Af Amer >60 >60 mL/min   Anion gap 11 5 - 15    Comment: Performed at [facility], [address] [place], [place] [address]  Ethanol     Status: None   Collection Time: 03/08/19 11:33 PM  Result Value Ref Range   Alcohol, Ethyl (B) <10 <10 mg/dL    Comment: (NOTE) Lowest detectable limit for serum alcohol is 10 mg/dL. For medical purposes only. Performed at [facility], [address] [place], [place] [address]   Salicylate level     Status: None   Collection Time: 03/08/19 11:33 PM  Result Value Ref Range   Salicylate Lvl <1.6 2.8 - 30.0 mg/dL    Comment: Performed at [facility], [place], [place] [address]  Acetaminophen level     Status: Abnormal   Collection Time: 03/08/19 11:33 PM  Result Value Ref Range   Acetaminophen (Tylenol), Serum <10 (L) 10 - 30 ug/mL    Comment: (NOTE) Therapeutic concentrations vary significantly. A range of 10-30 ug/mL  may be  an effective concentration for many patients. However, some  are best treated at concentrations outside of this range. Acetaminophen concentrations >150 ug/mL at 4 hours after ingestion  and >50 ug/mL at 12 hours after ingestion are often associated with  toxic reactions. Performed at [facility], [address] [place], [place] [address]   Lipase, blood     Status: None   Collection Time: 03/08/19 11:33 PM  Result Value Ref Range   Lipase 30 11 - 51 U/L    Comment: Performed at [facility], [address] [place], [place] [address]  Troponin I - Once     Status: None   Collection Time: 03/08/19 11:33 PM  Result Value Ref Range   Troponin I <0.03 <0.03 ng/mL    Comment: Performed at [facility], [address] [place], [place] [address]  CBC with Differential     Status: Abnormal   Collection Time: 03/08/19 11:33 PM  Result Value Ref Range   WBC 14.8 (H) 4.0 - 10.5 K/uL   RBC 5.30 4.22 - 5.81 MIL/uL   Hemoglobin 14.3 13.0 - 17.0 g/dL   HCT 41.8 39.0 - 52.0 %   MCV 78.9 (L) 80.0 - 100.0 fL   MCH 27.0 26.0 - 34.0 pg   MCHC 34.2 30.0 - 36.0 g/dL   RDW 12.8 11.5 - 15.5 %   Platelets 406 (H) 150 - 400 K/uL   nRBC 0.0 0.0 - 0.2 %   Neutrophils Relative % 52 %   Neutro Abs 7.6 1.7 - 7.7 K/uL   Lymphocytes Relative 37 %   Lymphs Abs 5.5 (H) 0.7 - 4.0 K/uL   Monocytes Relative 8 %   Monocytes Absolute 1.2 (H) 0.1 - 1.0 K/uL   Eosinophils Relative 1 %   Eosinophils Absolute 0.2 0.0 - 0.5 K/uL   Basophils Relative 1 %   Basophils Absolute 0.1 0.0 - 0.1 K/uL   Immature Granulocytes 1 %   Abs Immature Granulocytes 0.19 (H) 0.00 - 0.07 K/uL    Comment: Performed at [facility], [address] [place], [place] [address]  Protime-INR     Status: None   Collection Time: 03/08/19 11:33 PM  Result Value Ref Range   Prothrombin Time 12.0 11.4 - 15.2 seconds   INR 0.9 0.8 - 1.2    Comment: (NOTE) INR goal varies based on  device and disease  states. Performed at [facility], [address] [place], [place] [address]   Magnesium     Status: None   Collection Time: 03/08/19 11:33 PM  Result Value Ref Range   Magnesium 2.0 1.7 - 2.4 mg/dL    Comment: Performed at [facility], [address] [place], [place] [address]  SARS Coronavirus 2 (CEPHEID - Performed in [facility] hospital lab), Hosp Order     Status: None   Collection Time: 03/08/19 11:48 PM  Result Value Ref Range   SARS Coronavirus 2 NEGATIVE NEGATIVE    Comment: (NOTE) If result is NEGATIVE SARS-CoV-2 target nucleic acids are NOT DETECTED. The SARS-CoV-2 RNA is generally detectable in upper and lower  respiratory specimens during the acute phase of infection. The lowest  concentration of SARS-CoV-2 viral copies this assay can detect is 250  copies / mL. A negative result does not preclude SARS-CoV-2 infection  and should not be used as the sole basis for treatment or other  patient management decisions.  A negative result may occur with  improper specimen collection / handling, submission of specimen other  than nasopharyngeal swab, presence of viral mutation(s) within the  areas targeted by this assay, and inadequate number of viral copies  (<250 copies / mL). A negative result must be combined with clinical  observations, patient history, and epidemiological information. If result is POSITIVE SARS-CoV-2 target nucleic acids are DETECTED. The SARS-CoV-2 RNA is generally detectable in upper and lower  respiratory specimens dur ing the acute phase of infection.  Positive  results are indicative of active infection with SARS-CoV-2.  Clinical  correlation with patient history and other diagnostic information is  necessary to determine patient infection status.  Positive results do  not rule out bacterial infection or co-infection with other viruses. If result is PRESUMPTIVE POSTIVE SARS-CoV-2 nucleic acids MAY BE PRESENT.   A  presumptive positive result was obtained on the submitted specimen  and confirmed on repeat testing.  While 2019 novel coronavirus  (SARS-CoV-2) nucleic acids may be present in the submitted sample  additional confirmatory testing may be necessary for epidemiological  and / or clinical management purposes  to differentiate between  SARS-CoV-2 and other Sarbecovirus currently known to infect humans.  If clinically indicated additional testing with an alternate test  methodology [phone]) is advised. The SARS-CoV-2 RNA is generally  detectable in upper and lower respiratory sp ecimens during the acute  phase of infection. The expected result is Negative. Fact Sheet for Patients:  StrictlyIdeas.no Fact Sheet for Healthcare Providers: BankingDealers.co.za This test is not yet approved or cleared by the [place] FDA and has been authorized for detection and/or diagnosis of SARS-CoV-2 by FDA under an Emergency Use Authorization (EUA).  This EUA will remain in effect (meaning this test can be used) for the duration of the COVID-19 declaration under Section 564(b)(1) of the Act, 21 U.S.C. section 360bbb-3(b)(1), unless the authorization is terminated or revoked sooner. Performed at [facility], [address] [place],  [address]    No results found.  Pending Labs Unresulted Labs (From admission, onward)    Start     Ordered   03/09/19 7846  Basic metabolic panel  Now then every 4 hours,   STAT     03/09/19 0145   03/08/19 2334  Urine Drug Screen, Qualitative  Add-on,   AD     03/08/19 2333   03/08/19 2330  Urinalysis, Complete w Microscopic  ONCE - STAT,   STAT     03/08/19 2333   Signed and Held  HIV antibody (Routine Testing)  Once,   R     Signed and Held   Signed and Held  CBC  (enoxaparin (LOVENOX)    CrCl >/= 30 ml/min)  Once,   R    Comments:  Baseline for enoxaparin therapy IF NOT ALREADY DRAWN.  Notify MD  if PLT < 100 K.    Signed and Held   Signed and Held  Creatinine, serum  (enoxaparin (LOVENOX)    CrCl >/= 30 ml/min)  Once,   R    Comments:  Baseline for enoxaparin therapy IF NOT ALREADY DRAWN.    Signed and Held   Signed and Held  Creatinine, serum  (enoxaparin (LOVENOX)    CrCl >/= 30 ml/min)  Weekly,   R    Comments:  while on enoxaparin therapy    Signed and Held          Vitals/Pain Today's Vitals   03/08/19 2330 03/09/19 0000 03/09/19 0100 03/09/19 0200  BP: (!) 100/42 (!) 79/38 (!) 85/58 (!) 81/33  Pulse: (!) 109 89  91  Resp: (!) 28 (!) 29 (!) 26 (!) 21  Temp:      TempSrc:      SpO2: 96% 93%  93%  Weight:      Height:      PainSc:        Isolation Precautions No active isolations  Medications Medications  dextrose 50 % solution 45.35 g (has no administration in time range)  dextrose 10 % infusion (5 mL/kg/hr  90.7 kg Intravenous New Bag/Given 03/09/19 0156)  potassium chloride 10 mEq in 100 mL IVPB (has no administration in time range)  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 (1 Units/kg/hr  90.7 kg Intravenous New Bag/Given 03/09/19 0201)  sodium chloride 0.9 % bolus 1,000 mL (0 mLs Intravenous Stopped 03/09/19 0157)  sodium chloride 0.9 % bolus 1,000 mL (1,000 mLs Intravenous New Bag/Given 03/09/19 0035)  calcium gluconate 1 g/ 50 mL sodium chloride IVPB (0 g Intravenous Stopped 03/09/19 0157)  glucagon (human recombinant) (GLUCAGEN) injection 3 mg (3 mg Intravenous Given 03/09/19 0137)  potassium chloride SA (K-DUR) CR tablet 40 mEq (40 mEq Oral Given 03/09/19 0133)    Mobility walks Low fall risk   Focused Assessments OD   R Recommendations: See Admitting Provider Note  Report given to:   Additional Notes:

## 2019-03-09 NOTE — Progress Notes (Signed)
Levophed titrated to maximum dose with no improvement noted in BP.

## 2019-03-09 NOTE — Progress Notes (Signed)
Poison control updated 

## 2019-03-09 NOTE — Consult Note (Signed)
[facility] Psychiatry Consult   Reason for Consult: Suicide attempt by overdose Referring Physician: ICU provider Patient Identification: Glenn Rasmussen MRN:  [id number] Principal Diagnosis: Multiple drug overdose Diagnosis:  Principal Problem:   Multiple drug overdose Active Problems:   Suicide attempt ([place])   HTN (hypertension)   AKI (acute kidney injury) ([place])  Patient is seen, chart is reviewed.  Patient has provided permission to speak with mother, however family member obtained today Glenn Rasmussen [phone]) Total Time spent with patient: 25 minutes  Subjective: Patient is on ventilator with mild sedation.  He is able to nod head and acknowledge that he overdosed as a suicide attempt.  Patient acknowledges he is happy to have survived.  HPI:  Glenn Rasmussen is a 28 y.o. male patient admitted with drug overdose and suicide attempt.  Patient presents to the ED reporting multiple drug overdose.  He states that he is depressed and "just does not care about life anymore".  He states that he took a number of medications tonight including benzodiazepine, calcium channel blocker, Phenergan, amlodipine, and "just did not care what happened".  He is currently hypotensive, though awake and alert.  Treatment for his overdose was initiated in the ED and hospitalist were called for admission.Marland Kitchen  Psychiatry consult is requested for evaluation  Past Psychiatric History: Per medication review, patient appears to be treated for ADHD, anxiety, and insomnia.  Risk to Self:  Yes Risk to Others:  Not known Prior Inpatient Therapy:  Not known Prior Outpatient Therapy:  Not known  Past Medical History:  Past Medical History:  Diagnosis Date  . Hypertension   . IBS (irritable bowel syndrome)     Past Surgical History:  Procedure Laterality Date  . APPENDECTOMY    . CHOLECYSTECTOMY    . TONSILLECTOMY     Family History:  Family History  Problem Relation Age of Onset  . Lung  cancer Mother   . Lung cancer Father    Family Psychiatric  History: Unknown  Social History:  Social History   Substance and Sexual Activity  Alcohol Use No  . Frequency: Never     Social History   Substance and Sexual Activity  Drug Use No    Social History   Socioeconomic History  . Marital status: Single    Spouse name: Not on file  . Number of children: Not on file  . Years of education: Not on file  . Highest education level: Not on file  Occupational History  . Not on file  Social Needs  . Financial resource strain: Not on file  . Food insecurity:    Worry: Not on file    Inability: Not on file  . Transportation needs:    Medical: Not on file    Non-medical: Not on file  Tobacco Use  . Smoking status: Never Smoker  . Smokeless tobacco: Never Used  Substance and Sexual Activity  . Alcohol use: No    Frequency: Never  . Drug use: No  . Sexual activity: Not on file  Lifestyle  . Physical activity:    Days per week: Not on file    Minutes per session: Not on file  . Stress: Not on file  Relationships  . Social connections:    Talks on phone: Not on file    Gets together: Not on file    Attends religious service: Not on file    Active member of club or organization: Not on file  Attends meetings of clubs or organizations: Not on file    Relationship status: Not on file  Other Topics Concern  . Not on file  Social History Narrative  . Not on file   Additional Social History:    Unknown  Allergies:  No Known Allergies  Labs:  Results for orders placed or performed during the hospital encounter of 03/08/19 (from the past 48 hour(s))  Urinalysis, Complete w Microscopic     Status: Abnormal   Collection Time: 03/08/19 11:26 PM  Result Value Ref Range   Color, Urine YELLOW (A) YELLOW   APPearance CLEAR (A) CLEAR   Specific Gravity, Urine 1.021 1.005 - 1.030   pH 5.0 5.0 - 8.0   Glucose, UA NEGATIVE NEGATIVE mg/dL   Hgb urine dipstick NEGATIVE  NEGATIVE   Bilirubin Urine NEGATIVE NEGATIVE   Ketones, ur NEGATIVE NEGATIVE mg/dL   Protein, ur 30 (A) NEGATIVE mg/dL   Nitrite NEGATIVE NEGATIVE   Leukocytes,Ua NEGATIVE NEGATIVE   RBC / HPF 0-5 0 - 5 RBC/hpf   WBC, UA 0-5 0 - 5 WBC/hpf   Bacteria, UA NONE SEEN NONE SEEN   Squamous Epithelial / LPF 0-5 0 - 5   Mucus PRESENT     Comment: Performed at [facility], [address] [place], [place] [address]  Urine Drug Screen, Qualitative     Status: Abnormal   Collection Time: 03/08/19 11:26 PM  Result Value Ref Range   Tricyclic, Ur Screen POSITIVE (A) NONE DETECTED   Amphetamines, Ur Screen NONE DETECTED NONE DETECTED   MDMA (Ecstasy)Ur Screen NONE DETECTED NONE DETECTED   Cocaine Metabolite,Ur [place] NONE DETECTED NONE DETECTED   Opiate, Ur Screen NONE DETECTED NONE DETECTED   Phencyclidine (PCP) Ur S NONE DETECTED NONE DETECTED   Cannabinoid 50 Ng, Ur [place] NONE DETECTED NONE DETECTED   Barbiturates, Ur Screen NONE DETECTED NONE DETECTED   Benzodiazepine, Ur Scrn POSITIVE (A) NONE DETECTED   Methadone Scn, Ur NONE DETECTED NONE DETECTED    Comment: (NOTE) Tricyclics + metabolites, urine    Cutoff 1000 ng/mL Amphetamines + metabolites, urine  Cutoff 1000 ng/mL MDMA (Ecstasy), urine              Cutoff 500 ng/mL Cocaine Metabolite, urine          Cutoff 300 ng/mL Opiate + metabolites, urine        Cutoff 300 ng/mL Phencyclidine (PCP), urine         Cutoff 25 ng/mL Cannabinoid, urine                 Cutoff 50 ng/mL Barbiturates + metabolites, urine  Cutoff 200 ng/mL Benzodiazepine, urine              Cutoff 200 ng/mL Methadone, urine                   Cutoff 300 ng/mL The urine drug screen provides only a preliminary, unconfirmed analytical test result and should not be used for non-medical purposes. Clinical consideration and professional judgment should be applied to any positive drug screen result due to possible interfering substances. A more specific alternate  chemical method must be used in order to obtain a confirmed analytical result. Gas chromatography / mass spectrometry (GC/MS) is the preferred confirmat ory method. Performed at [facility], [address], [place], [place] [address]   Comprehensive metabolic panel     Status: Abnormal   Collection Time: 03/08/19 11:33 PM  Result Value Ref  Range   Sodium 140 135 - 145 mmol/L   Potassium 3.2 (L) 3.5 - 5.1 mmol/L    Comment: HEMOLYSIS AT THIS LEVEL MAY AFFECT RESULT   Chloride 108 98 - 111 mmol/L   CO2 21 (L) 22 - 32 mmol/L   Glucose, Bld 136 (H) 70 - 99 mg/dL   BUN 9 6 - 20 mg/dL   Creatinine, Ser 1.26 (H) 0.61 - 1.24 mg/dL   Calcium 9.1 8.9 - 10.3 mg/dL   Total Protein 7.7 6.5 - 8.1 g/dL   Albumin 4.7 3.5 - 5.0 g/dL   AST 30 15 - 41 U/L   ALT 36 0 - 44 U/L   Alkaline Phosphatase 64 38 - 126 U/L   Total Bilirubin 0.8 0.3 - 1.2 mg/dL   GFR calc non Af Amer >60 >60 mL/min   GFR calc Af Amer >60 >60 mL/min   Anion gap 11 5 - 15    Comment: Performed at [facility], [address] [place], [place] [address]  Ethanol     Status: None   Collection Time: 03/08/19 11:33 PM  Result Value Ref Range   Alcohol, Ethyl (B) <10 <10 mg/dL    Comment: (NOTE) Lowest detectable limit for serum alcohol is 10 mg/dL. For medical purposes only. Performed at [facility], [address] [place], [place] [address]   Salicylate level     Status: None   Collection Time: 03/08/19 11:33 PM  Result Value Ref Range   Salicylate Lvl <7.3 2.8 - 30.0 mg/dL    Comment: Performed at [facility], [place], [place] [address]  Acetaminophen level     Status: Abnormal   Collection Time: 03/08/19 11:33 PM  Result Value Ref Range   Acetaminophen (Tylenol), Serum <10 (L) 10 - 30 ug/mL    Comment: (NOTE) Therapeutic concentrations vary significantly. A range of 10-30 ug/mL  may be an effective concentration for many patients. However, some  are best  treated at concentrations outside of this range. Acetaminophen concentrations >150 ug/mL at 4 hours after ingestion  and >50 ug/mL at 12 hours after ingestion are often associated with  toxic reactions. Performed at [facility] [address] [place], [place] [address]   Lipase, blood     Status: None   Collection Time: 03/08/19 11:33 PM  Result Value Ref Range   Lipase 30 11 - 51 U/L    Comment: Performed at [facility], [address] [place], [place] [address]  Troponin I - Once     Status: None   Collection Time: 03/08/19 11:33 PM  Result Value Ref Range   Troponin I <0.03 <0.03 ng/mL    Comment: Performed at [facility], [address] [place], [place] [address]  CBC with Differential     Status: Abnormal   Collection Time: 03/08/19 11:33 PM  Result Value Ref Range   WBC 14.8 (H) 4.0 - 10.5 K/uL   RBC 5.30 4.22 - 5.81 MIL/uL   Hemoglobin 14.3 13.0 - 17.0 g/dL   HCT 41.8 39.0 - 52.0 %   MCV 78.9 (L) 80.0 - 100.0 fL   MCH 27.0 26.0 - 34.0 pg   MCHC 34.2 30.0 - 36.0 g/dL   RDW 12.8 11.5 - 15.5 %   Platelets 406 (H) 150 - 400 K/uL   nRBC 0.0 0.0 - 0.2 %   Neutrophils Relative % 52 %   Neutro Abs 7.6 1.7 - 7.7 K/uL  Lymphocytes Relative 37 %   Lymphs Abs 5.5 (H) 0.7 - 4.0 K/uL   Monocytes Relative 8 %   Monocytes Absolute 1.2 (H) 0.1 - 1.0 K/uL   Eosinophils Relative 1 %   Eosinophils Absolute 0.2 0.0 - 0.5 K/uL   Basophils Relative 1 %   Basophils Absolute 0.1 0.0 - 0.1 K/uL   Immature Granulocytes 1 %   Abs Immature Granulocytes 0.19 (H) 0.00 - 0.07 K/uL    Comment: Performed at [facility], [address] [place], [place] [address]  Protime-INR     Status: None   Collection Time: 03/08/19 11:33 PM  Result Value Ref Range   Prothrombin Time 12.0 11.4 - 15.2 seconds   INR 0.9 0.8 - 1.2    Comment: (NOTE) INR goal varies based on device and disease states. Performed at [facility], [address]  [place], [place] [address]   Magnesium     Status: None   Collection Time: 03/08/19 11:33 PM  Result Value Ref Range   Magnesium 2.0 1.7 - 2.4 mg/dL    Comment: Performed at [facility], [address] [place], [place] [address]  SARS Coronavirus 2 (CEPHEID - Performed in [facility] hospital lab), Hosp Order     Status: None   Collection Time: 03/08/19 11:48 PM  Result Value Ref Range   SARS Coronavirus 2 NEGATIVE NEGATIVE    Comment: (NOTE) If result is NEGATIVE SARS-CoV-2 target nucleic acids are NOT DETECTED. The SARS-CoV-2 RNA is generally detectable in upper and lower  respiratory specimens during the acute phase of infection. The lowest  concentration of SARS-CoV-2 viral copies this assay can detect is 250  copies / mL. A negative result does not preclude SARS-CoV-2 infection  and should not be used as the sole basis for treatment or other  patient management decisions.  A negative result may occur with  improper specimen collection / handling, submission of specimen other  than nasopharyngeal swab, presence of viral mutation(s) within the  areas targeted by this assay, and inadequate number of viral copies  (<250 copies / mL). A negative result must be combined with clinical  observations, patient history, and epidemiological information. If result is POSITIVE SARS-CoV-2 target nucleic acids are DETECTED. The SARS-CoV-2 RNA is generally detectable in upper and lower  respiratory specimens dur ing the acute phase of infection.  Positive  results are indicative of active infection with SARS-CoV-2.  Clinical  correlation with patient history and other diagnostic information is  necessary to determine patient infection status.  Positive results do  not rule out bacterial infection or co-infection with other viruses. If result is PRESUMPTIVE POSTIVE SARS-CoV-2 nucleic acids MAY BE PRESENT.   A presumptive positive result was obtained on the submitted specimen  and  confirmed on repeat testing.  While 2019 novel coronavirus  (SARS-CoV-2) nucleic acids may be present in the submitted sample  additional confirmatory testing may be necessary for epidemiological  and / or clinical management purposes  to differentiate between  SARS-CoV-2 and other Sarbecovirus currently known to infect humans.  If clinically indicated additional testing with an alternate test  methodology [phone]) is advised. The SARS-CoV-2 RNA is generally  detectable in upper and lower respiratory sp ecimens during the acute  phase of infection. The expected result is Negative. Fact Sheet for Patients:  StrictlyIdeas.no Fact Sheet for Healthcare Providers: BankingDealers.co.za This test is not yet approved or cleared by the [place] FDA and has been authorized for  detection and/or diagnosis of SARS-CoV-2 by FDA under an Emergency Use Authorization (EUA).  This EUA will remain in effect (meaning this test can be used) for the duration of the COVID-19 declaration under Section 564(b)(1) of the Act, 21 U.S.C. section 360bbb-3(b)(1), unless the authorization is terminated or revoked sooner. Performed at [facility], [address] [place], [place] [address]   Glucose, capillary     Status: Abnormal   Collection Time: 03/09/19  2:41 AM  Result Value Ref Range   Glucose-Capillary 229 (H) 70 - 99 mg/dL  MRSA PCR Screening     Status: None   Collection Time: 03/09/19  2:48 AM  Result Value Ref Range   MRSA by PCR NEGATIVE NEGATIVE    Comment:        The GeneXpert MRSA Assay (FDA approved for NASAL specimens only), is one component of a comprehensive MRSA colonization surveillance program. It is not intended to diagnose MRSA infection nor to guide or monitor treatment for MRSA infections. Performed at [facility], [address] [place], [place] [address]   Glucose, capillary     Status: Abnormal    Collection Time: 03/09/19  3:53 AM  Result Value Ref Range   Glucose-Capillary 207 (H) 70 - 99 mg/dL  Comprehensive metabolic panel     Status: Abnormal   Collection Time: 03/09/19  4:57 AM  Result Value Ref Range   Sodium 135 135 - 145 mmol/L   Potassium 2.7 (LL) 3.5 - 5.1 mmol/L    Comment: CRITICAL RESULT CALLED TO, READ BACK BY AND VERIFIED WITH Zakhi WILLIAMSON '@[id number]'$  03/09/19 AKT   Chloride 106 98 - 111 mmol/L   CO2 17 (L) 22 - 32 mmol/L   Glucose, Bld 271 (H) 70 - 99 mg/dL   BUN 11 6 - 20 mg/dL   Creatinine, Ser 2.09 (H) 0.61 - 1.24 mg/dL   Calcium 8.5 (L) 8.9 - 10.3 mg/dL   Total Protein 6.4 (L) 6.5 - 8.1 g/dL   Albumin 3.9 3.5 - 5.0 g/dL   AST 57 (H) 15 - 41 U/L   ALT 50 (H) 0 - 44 U/L   Alkaline Phosphatase 60 38 - 126 U/L   Total Bilirubin 1.1 0.3 - 1.2 mg/dL   GFR calc non Af Amer 41 (L) >60 mL/min   GFR calc Af Amer 48 (L) >60 mL/min   Anion gap 12 5 - 15    Comment: Performed at [facility], [address] [place], [place] [address]  CBC     Status: Abnormal   Collection Time: 03/09/19  4:57 AM  Result Value Ref Range   WBC 32.7 (H) 4.0 - 10.5 K/uL   RBC 4.61 4.22 - 5.81 MIL/uL   Hemoglobin 12.6 (L) 13.0 - 17.0 g/dL   HCT 37.9 (L) 39.0 - 52.0 %   MCV 82.2 80.0 - 100.0 fL   MCH 27.3 26.0 - 34.0 pg   MCHC 33.2 30.0 - 36.0 g/dL   RDW 12.7 11.5 - 15.5 %   Platelets 393 150 - 400 K/uL   nRBC 0.0 0.0 - 0.2 %    Comment: Performed at [facility], [address] [place], [place] [address]  Magnesium     Status: None   Collection Time: 03/09/19  4:57 AM  Result Value Ref Range   Magnesium 1.8 1.7 - 2.4 mg/dL    Comment: Performed at [facility], [address], [place],  [address]  Phosphorus  Status: Abnormal   Collection Time: 03/09/19  4:57 AM  Result Value Ref Range   Phosphorus 1.5 (L) 2.5 - 4.6 mg/dL    Comment: Performed at [facility], [address] [place], [place] [address]  Glucose, capillary      Status: Abnormal   Collection Time: 03/09/19  5:07 AM  Result Value Ref Range   Glucose-Capillary 305 (H) 70 - 99 mg/dL  Glucose, capillary     Status: Abnormal   Collection Time: 03/09/19  6:10 AM  Result Value Ref Range   Glucose-Capillary 313 (H) 70 - 99 mg/dL  Lactic acid, plasma     Status: Abnormal   Collection Time: 03/09/19  6:24 AM  Result Value Ref Range   Lactic Acid, Venous 5.1 (HH) 0.5 - 1.9 mmol/L    Comment: CRITICAL RESULT CALLED TO, READ BACK BY AND VERIFIED WITH Cali WILLIAMSON '@0652'$  03/09/19 AKT Performed at [facility], [address] [place], [place] [address]   Draw ABG 1 hour after initiation of ventilator     Status: Abnormal   Collection Time: 03/09/19  6:38 AM  Result Value Ref Range   FIO2 0.40    Delivery systems VENTILATOR    Mode PRESSURE REGULATED VOLUME CONTROL    VT 500 mL   Peep/cpap 5.0 cm H20   pH, Arterial 7.23 (L) 7.350 - 7.450   pCO2 arterial 41 32.0 - 48.0 mmHg   pO2, Arterial 79 (L) 83.0 - 108.0 mmHg   Bicarbonate 17.2 (L) 20.0 - 28.0 mmol/L   Acid-base deficit 9.9 (H) 0.0 - 2.0 mmol/L   O2 Saturation 92.9 %   Patient temperature 37.0    Collection site A-LINE    Sample type ARTERIAL DRAW    Mechanical Rate 16     Comment: Performed at [facility], [address] [place], [place] [address]  Glucose, capillary     Status: Abnormal   Collection Time: 03/09/19  7:37 AM  Result Value Ref Range   Glucose-Capillary 232 (H) 70 - 99 mg/dL  Basic metabolic panel     Status: Abnormal   Collection Time: 03/09/19  8:19 AM  Result Value Ref Range   Sodium 134 (L) 135 - 145 mmol/L   Potassium 3.2 (L) 3.5 - 5.1 mmol/L   Chloride 106 98 - 111 mmol/L   CO2 16 (L) 22 - 32 mmol/L   Glucose, Bld 288 (H) 70 - 99 mg/dL   BUN 13 6 - 20 mg/dL   Creatinine, Ser 2.47 (H) 0.61 - 1.24 mg/dL   Calcium 8.4 (L) 8.9 - 10.3 mg/dL   GFR calc non Af Amer 34 (L) >60 mL/min   GFR calc Af Amer 39 (L) >60 mL/min   Anion gap 12 5 - 15     Comment: Performed at [facility], [address] [place], [place] [address]  Magnesium     Status: Abnormal   Collection Time: 03/09/19  8:19 AM  Result Value Ref Range   Magnesium 1.6 (L) 1.7 - 2.4 mg/dL    Comment: Performed at [facility], [address] [place], [place] [address]  Phosphorus     Status: Abnormal   Collection Time: 03/09/19  8:19 AM  Result Value Ref Range   Phosphorus <1.0 (LL) 2.5 - 4.6 mg/dL    Comment: CRITICAL RESULT CALLED TO, READ BACK BY AND VERIFIED WITH REBECCA LYNN AT 0900 ON 03/09/19 KLM Performed at [facility], [address] [place],  [place] [address]   Procalcitonin - Baseline     Status: None   Collection Time: 03/09/19  8:19 AM  Result Value Ref Range   Procalcitonin 5.77 ng/mL    Comment:        Interpretation: PCT > 2 ng/mL: Systemic infection (sepsis) is likely, unless other causes are known. (NOTE)       Sepsis PCT Algorithm           Lower Respiratory Tract                                      Infection PCT Algorithm    ----------------------------     ----------------------------         PCT < 0.25 ng/mL                PCT < 0.10 ng/mL         Strongly encourage             Strongly discourage   discontinuation of antibiotics    initiation of antibiotics    ----------------------------     -----------------------------       PCT 0.25 - 0.50 ng/mL            PCT 0.10 - 0.25 ng/mL               OR       >80% decrease in PCT            Discourage initiation of                                            antibiotics      Encourage discontinuation           of antibiotics    ----------------------------     -----------------------------         PCT >= 0.50 ng/mL              PCT 0.26 - 0.50 ng/mL               AND       <80% decrease in PCT              Encourage initiation of                                             antibiotics       Encourage continuation           of antibiotics     ----------------------------     -----------------------------        PCT >= 0.50 ng/mL                  PCT > 0.50 ng/mL               AND         increase in PCT                  Strongly encourage  initiation of antibiotics    Strongly encourage escalation           of antibiotics                                     -----------------------------                                           PCT <= 0.25 ng/mL                                                 OR                                        > 80% decrease in PCT                                     Discontinue / Do not initiate                                             antibiotics Performed at [facility], [address] [place], [place] [address]   Glucose, capillary     Status: Abnormal   Collection Time: 03/09/19  8:29 AM  Result Value Ref Range   Glucose-Capillary 240 (H) 70 - 99 mg/dL  Glucose, capillary     Status: Abnormal   Collection Time: 03/09/19  9:12 AM  Result Value Ref Range   Glucose-Capillary 245 (H) 70 - 99 mg/dL  Glucose, capillary     Status: Abnormal   Collection Time: 03/09/19  9:30 AM  Result Value Ref Range   Glucose-Capillary 224 (H) 70 - 99 mg/dL    Current Facility-Administered Medications  Medication Dose Route Frequency Provider Last Rate Last Dose  . bisacodyl (DULCOLAX) suppository 10 mg  10 mg Rectal Daily PRN Tukov-Yual, Magdalene S, NP      . chlorhexidine gluconate (MEDLINE KIT) (PERIDEX) 0.12 % solution 15 mL  15 mL Mouth Rinse BID Tukov-Yual, Magdalene S, NP   15 mL at 03/09/19 0800  . Chlorhexidine Gluconate Cloth 2 % PADS 6 each  6 each Topical Q0600 Lance Coon, MD   6 each at 03/09/19 [phone]  . dextrose 50 % solution 45.35 g  0.5 g/kg Intravenous Once PRN Lance Coon, MD      . Derrill Memo ON 03/10/2019] enoxaparin (LOVENOX) injection 40 mg  40 mg Subcutaneous Q24H Charlett Nose, RPH      . EPINEPHrine (ADRENALIN) 8 mg in dextrose 5 %  250 mL (0.032 mg/mL) infusion  0.5-20 mcg/min Intravenous Titrated Ottie Glazier, MD 5.63 mL/hr at 03/09/19 1200 3 mcg/min at 03/09/19 1200  . fentaNYL (SUBLIMAZE) bolus via infusion 50 mcg  50 mcg Intravenous Q15 min PRN Tukov-Yual, Magdalene S, NP      . fentaNYL 2543mg in NS 2528m(1047mml) infusion-PREMIX  50-200 mcg/hr Intravenous Continuous Tukov-Yual, MagArlyss GandyP  5 mL/hr at 03/09/19 1200 50 mcg/hr at 03/09/19 1200  . ipratropium-albuterol (DUONEB) 0.5-2.5 (3) MG/3ML nebulizer solution 3 mL  3 mL Nebulization Q6H Tukov-Yual, Magdalene S, NP   3 mL at 03/09/19 0746  . lactated ringers 1,000 mL with potassium chloride 40 mEq infusion   Intravenous Continuous Ottie Glazier, MD   Stopped at 03/09/19 1051  . MEDLINE mouth rinse  15 mL Mouth Rinse 10 times per day Tukov-Yual, Magdalene S, NP      . midazolam (VERSED) injection 2-4 mg  2-4 mg Intravenous Q2H PRN Tukov-Yual, Magdalene S, NP      . norepinephrine (LEVOPHED) 16 mg in 212m premix infusion  0-65 mcg/min Intravenous Titrated Tukov-Yual, Magdalene S, NP 60.9 mL/hr at 03/09/19 1200 65 mcg/min at 03/09/19 1200  . pantoprazole (PROTONIX) injection 40 mg  40 mg Intravenous Q12H Aleskerov, Fuad, MD      . piperacillin-tazobactam (ZOSYN) IVPB 3.375 g  3.375 g Intravenous Q8H Tukov-Yual, Magdalene S, NP   Stopped at 03/09/19 0731  . potassium PHOSPHATE 45 mmol in dextrose 5 % 500 mL infusion  45 mmol Intravenous Once KGladstone Lighter MD 125 mL/hr at 03/09/19 1000 45 mmol at 03/09/19 1000  . sennosides (SENOKOT) 8.8 MG/5ML syrup 5 mL  5 mL Per Tube BID PRN Tukov-Yual, Magdalene S, NP      . sodium chloride flush (NS) 0.9 % injection 10-40 mL  10-40 mL Intracatheter Q12H Tukov-Yual, Magdalene S, NP   10 mL at 03/09/19 1122  . sodium chloride flush (NS) 0.9 % injection 10-40 mL  10-40 mL Intracatheter PRN Tukov-Yual, Magdalene S, NP      . vasopressin (PITRESSIN) 40 Units in sodium chloride 0.9 % 250 mL (0.16 Units/mL) infusion  0.03  Units/min Intravenous Continuous Aleskerov, Fuad, MD 11.25 mL/hr at 03/09/19 0900 0.03 Units/min at 03/09/19 0900    Musculoskeletal: Strength & Muscle Tone: decreased Gait & Station: Not assessed Patient leans: N/A  Psychiatric Specialty Exam: Physical Exam  Nursing note and vitals reviewed. Constitutional: He appears well-developed and well-nourished.  HENT:  Head: Normocephalic and atraumatic.  Eyes: EOM are normal.  Neck: Normal range of motion.  Cardiovascular: Regular rhythm.  Tachycardia  Respiratory: No respiratory distress.  On ventilator  Neurological: He is alert.    Review of Systems  Unable to perform ROS: Intubated  Psychiatric/Behavioral: Positive for depression and suicidal ideas.    Blood pressure (!) 96/46, pulse 87, temperature 98.4 F (36.9 C), temperature source Oral, resp. rate (!) 28, height 5' 7.01" (1.702 m), weight 90.7 kg, SpO2 96 %.Body mass index is 31.32 kg/m.  General Appearance: Disheveled  Eye Contact:  Fair  Speech:  NA  Volume:  Intubated  Mood:  Anxious and Depressed  Affect:  Congruent  Thought Process:  Coherent  Orientation:  Other:  Appears full  Thought Content:  Hallucinations: None  Suicidal Thoughts:  Yes.  with intent/plan  Homicidal Thoughts:  No  Memory:  NA  Judgement:  Poor  Insight:  Present and Shallow  Psychomotor Activity:  Decreased  Concentration:  Concentration: Fair  Recall:  NA  Fund of Knowledge:  NA  Language:  NA  Akathisia:  No  Handed:  Right  AIMS (if indicated):     Assets:  Social Support patient has given consent to talk to mother, LMarjo [phone] ADL's:  Impaired  Cognition:  Impaired,  Mild  Sleep:   Impaired due to ICU     Treatment Plan Summary: Daily contact with  patient to assess and evaluate symptoms and progress in treatment and Medication management  Hold psychiatric medication at this time until patient more medically stable.  Disposition: Recommend psychiatric  Inpatient admission when medically cleared. Supportive therapy provided about ongoing stressors.  patient has given consent to talk to mother, Glenn Rasmussen [phone]  Psychiatry will continue to follow and advise medication management as indicated.  Lavella Hammock, MD 03/09/2019 12:45 PM

## 2019-03-09 NOTE — Progress Notes (Addendum)
eLink Physician-Brief Progress Note Patient Name: Glenn Rasmussen DOB: [birth year] MRN: [id number]   Date of Service  03/09/2019  HPI/Events of Note  28 year old male admitted to the ICU for polydrug abuse including Klonopin, amlodipine and Phenergan.   eICU Interventions  Admitted for close monitoring.  Has suicidal ideations. Notified Glenn Rasmussen.         Ephriam Jenkins Danni Shima 03/09/2019, 2:05 AM

## 2019-03-09 NOTE — Progress Notes (Signed)
Lactated ringer bolus initiated for hypotension

## 2019-03-09 NOTE — Progress Notes (Addendum)
   CRITICAL CARE NOTE        SUBJECTIVE FINDINGS & SIGNIFICANT EVENTS   28 year old male admitted with intentional suicide attempt with several bottles of home medications.   Discussed case with Dr. Manuella Ghazi medical toxicology-appreciate collaboration  -Poison control appreciates our aggressive care and significant improvement -On glucagon GTT -Currently requiring Levophed, epinephrine, Neo-Synephrine, vasopressin -Insulin drip -Activated charcoal via OGT   -Performed weaning trial today patient is lucid now and able to follow verbal communication however had desaturation and remains on 4 vasopressors-we will attempt again  -Discussed case with mother and updated on patient's current condition.   -Left femoral dialysis catheter placed-CVVH initiated    Critical care provider statement:    Critical care time (minutes):  125   Critical care time was exclusive of:  Separately billable procedures and  treating other patients   Critical care was necessary to treat or prevent imminent or  life-threatening deterioration of the following conditions:   Intentional suicide attempt   Critical care was time spent personally by me on the following  activities:  Development of treatment plan with patient or surrogate,  discussions with consultants, evaluation of patient's response to  treatment, examination of patient, obtaining history from patient or  surrogate, ordering and performing treatments and interventions, ordering  and review of laboratory studies and re-evaluation of patient's condition   I assumed direction of critical care for this patient from another  provider in my specialty: no       Ottie Glazier, M.D.  Division of [place]

## 2019-03-09 NOTE — Progress Notes (Signed)
Versed 2mg  given IV for sedation prior to vascular access placement per MD order.

## 2019-03-09 NOTE — ED Provider Notes (Signed)
[facility] Department of Emergency Medicine   Code Blue CONSULT NOTE  Chief Complaint: Cardiac arrest/unresponsive   Level V Caveat: Unresponsive  History of present illness: I was contacted by the NP in the CCU for a CODE BLUE.  This patient, who I had recently admitted for multiple prescription medication overdoses (primarily antihypertensives) has become minimally responsive with multiple episodes of vomiting.  He requires intubation for airway protection and has decompensated considerably since leaving the emergency department.  She contacted her ICU attending physician who recommended that she call the ED to intubate the patient.  As per hospital policy if we are needed in the ICU, she initiated a CODE BLUE.  When I arrived to the bedside the patient was breathing spontaneously but was unresponsive to any painful stimuli, pale, borderline hypotensive on pressors started by the nurse practitioner in the ICU.  He had evidence of emesis around his mouth and had last vomited about 15 minutes ago.  ROS: Unable to obtain, Level V caveat  Scheduled Meds: . ondansetron      . Chlorhexidine Gluconate Cloth  6 each Topical Q0600  . enoxaparin (LOVENOX) injection  40 mg Subcutaneous Q24H  . fentaNYL (SUBLIMAZE) injection  50 mcg Intravenous Once  . glucagon (human recombinant)  1 mg Intravenous Once   Continuous Infusions: . dexmedetomidine (PRECEDEX) IV infusion 0.6 mcg/kg/hr (03/09/19 0351)  . dextrose 5 mL/kg/hr (03/09/19 0430)  . fentaNYL infusion INTRAVENOUS    . insulin 1 Units/kg/hr (03/09/19 0315)  . norepinephrine (LEVOPHED) Adult infusion 30 mcg/min (03/09/19 0431)  . potassium chloride 10 mEq (03/09/19 0330)   PRN Meds:.bisacodyl, dextrose, fentaNYL, midazolam, midazolam, sennosides Past Medical History:  Diagnosis Date  . Hypertension   . IBS (irritable bowel syndrome)    Past Surgical History:  Procedure Laterality Date  . APPENDECTOMY    .  CHOLECYSTECTOMY    . TONSILLECTOMY     Social History   Socioeconomic History  . Marital status: Single    Spouse name: Not on file  . Number of children: Not on file  . Years of education: Not on file  . Highest education level: Not on file  Occupational History  . Not on file  Social Needs  . Financial resource strain: Not on file  . Food insecurity:    Worry: Not on file    Inability: Not on file  . Transportation needs:    Medical: Not on file    Non-medical: Not on file  Tobacco Use  . Smoking status: Never Smoker  . Smokeless tobacco: Never Used  Substance and Sexual Activity  . Alcohol use: No    Frequency: Never  . Drug use: No  . Sexual activity: Not on file  Lifestyle  . Physical activity:    Days per week: Not on file    Minutes per session: Not on file  . Stress: Not on file  Relationships  . Social connections:    Talks on phone: Not on file    Gets together: Not on file    Attends religious service: Not on file    Active member of club or organization: Not on file    Attends meetings of clubs or organizations: Not on file    Relationship status: Not on file  . Intimate partner violence:    Fear of current or ex partner: Not on file    Emotionally abused: Not on file    Physically abused: Not on file    Forced  sexual activity: Not on file  Other Topics Concern  . Not on file  Social History Narrative  . Not on file   No Known Allergies  Last set of Vital Signs (not current) Vitals:   03/09/19 0200 03/09/19 0245  BP: (!) 81/33   Pulse: 91   Resp: (!) 21   Temp:  98.4 F (36.9 C)  SpO2: 93% 98%      Physical Exam  Gen: unresponsive Cardiovascular: Spontaneous pulse, hypotensive at around 81/33. Resp: Spontaneous respirations, slow, not quite agonal but also not normal respirations. Abd: nondistended  Neuro: GCS 3, unresponsive to pain  HEENT: No blood in posterior pharynx, gag reflex absent .  Evidence of recent emesis but no  significant debris and airway. Neck: No crepitus  Musculoskeletal: No deformity  Skin: warm, pale  Procedures   .Critical Care Performed by: Hinda Kehr, MD Authorized by: Hinda Kehr, MD   Critical care provider statement:    Critical care time (minutes):  10   Critical care time was exclusive of:  Separately billable procedures and treating other patients   Critical care was necessary to treat or prevent imminent or life-threatening deterioration of the following conditions:  Respiratory failure   Critical care was time spent personally by me on the following activities:  Development of treatment plan with patient or surrogate, discussions with consultants, evaluation of patient's response to treatment, examination of patient, obtaining history from patient or surrogate, ordering and performing treatments and interventions, ordering and review of laboratory studies, ordering and review of radiographic studies, pulse oximetry, re-evaluation of patient's condition and review of old charts Procedure Name: Intubation Date/Time: 03/09/2019 5:08 AM Performed by: Hinda Kehr, MD Pre-anesthesia Checklist: Patient identified, Emergency Drugs available, Suction available and Patient being monitored Oxygen Delivery Method: Ambu bag Preoxygenation: Pre-oxygenation with 100% oxygen Induction Type: IV induction and Rapid sequence Ventilation: Mask ventilation without difficulty Laryngoscope Size: Glidescope Tube size: 7.5 mm Number of attempts: 1 Airway Equipment and Method: Patient positioned with wedge pillow Placement Confirmation: ETT inserted through vocal cords under direct vision,  CO2 detector and Breath sounds checked- equal and bilateral Dental Injury: Teeth and Oropharynx as per pre-operative assessment          Assessment and Plan  Patient decompensated significantly after leaving the ED and required airway protection due to his obtundation, vomiting, and need for  mechanical ventilation.  I was called to the ED via [place] to assist.  I intubated the patient on the first pass without difficulty.  The nurse practitioner and then resumed care of the patient and will be placing a central line.  I was no longer needed so returned to the emergency department.    Hinda Kehr, MD 03/09/19 [phone]

## 2019-03-09 NOTE — Progress Notes (Signed)
Patient continues to be hypotensive with no urine output.  Dr. Berdie Ogren notified.  2nd lactated ringer bolus to be given.

## 2019-03-09 NOTE — Progress Notes (Signed)
Spoke with Poison Control Toxicologist Dr. Manuella Ghazi to discuss plan of care, informed him pt currently on vasopressin gtt @0 .04 units/hr, levophed gtt @65  mcg/min, epinephrine gtt @15  mcg/min, and neo-synephrine @200  mcg/min.  However, he remains hypotensive with map of 55.  Dr. Manuella Ghazi recommended increasing vasopressin to 0.06 units/hr, neo-synephrine to 400 mcg/min, and increasing levophed gtt rate.  He also recommended stat read on Echo performed at 1837.  Therefore, Dr. Saralyn Pilar contacted to read Echo.  Dr. Manuella Ghazi stated he was ok with administration of sodium bicarb due to repeat ABG revealing metabolic acidosis.  Femoral temporary dual lumen dialysis catheter replaced with left internal jugular dual lumen due to femoral dialysis catheter not functioning. RN to initiate CRRT. Will continue to monitor and assess pt.  Marda Stalker, [place] Pager [phone] (please enter 7 digits) PCCM Consult Pager [phone] (please enter 7 digits)

## 2019-03-09 NOTE — Progress Notes (Signed)
Updated pts mother Sourish Allender via telephone regarding pt condition and plan of care all questions were answered will continue to monitor and assess pt.  Marda Stalker, [place] Pager [phone] (please enter 7 digits) PCCM Consult Pager [phone] (please enter 7 digits)

## 2019-03-09 NOTE — Progress Notes (Signed)
Pharmacy Antibiotic Note  Glenn Rasmussen is a 28 y.o. male admitted on 03/08/2019 with aspiration pneumonia.  Pharmacy has been consulted for zosyn dosing.  Plan: Zosyn 3.375g IV q8h (4 hour infusion).  Height: 5\' 7"  (170.2 cm) Weight: 200 lb (90.7 kg) IBW/kg (Calculated) : 66.1  Temp (24hrs), Avg:98.4 F (36.9 C), Min:98.3 F (36.8 C), Max:98.4 F (36.9 C)  Recent Labs  Lab 03/08/19 2333  WBC 14.8*  CREATININE 1.26*    Estimated Creatinine Clearance: 92 mL/min (A) (by C-G formula based on SCr of 1.26 mg/dL (H)).    No Known Allergies  Thank you for allowing pharmacy to be a part of this patient's care.  Tobie Lords, PharmD, BCPS Clinical Pharmacist 03/09/2019 5:07 AM

## 2019-03-09 NOTE — Procedures (Signed)
Central Venous Catheter Placement: 2 LUMEN DIALYSIS CATHETER Indication: Patient receiving vesicant or irritant drug.; Patient receiving intravenous therapy for longer than 5 days.; Patient has limited or no vascular access.   Consent: MOTHER GAVE WITNESSED BY RN RACHAEL    Hand washing performed prior to starting the procedure.   Procedure:   An active timeout was performed and correct patient, name, & ID confirmed.   Patient was positioned correctly for central venous access.  Patient was prepped using strict sterile technique including chlorohexadine preps, sterile drape, sterile gown and sterile gloves.    The area was prepped, draped and anesthetized in the usual sterile manner. Patient comfort was obtained.    A triple lumen catheter was placed in LEFT FEMORAL There was good blood return, catheter caps were placed on lumens, catheter flushed easily, the line was secured and a sterile dressing and BIO-PATCH applied.   Ultrasound was used to visualize vasculature and guidance of needle.   Number of Attempts: 1 Complications:none Estimated Blood Loss: none Chest Radiograph indicated and ordered.     Ottie Glazier, M.D.  Pulmonary & [place]

## 2019-03-09 NOTE — Consult Note (Addendum)
Pharmacy Electrolyte Monitoring Consult:  Pharmacy consulted to assist in monitoring and replacing electrolytes in this      Labs:   Potassium (mmol/L)  Date Value  03/09/2019 4.8   Magnesium (mg/dL)  Date Value  03/09/2019 1.6 (L)   Phosphorus (mg/dL)  Date Value  03/09/2019 8.0 (H)   Calcium (mg/dL)  Date Value  03/09/2019 8.2 (L)   Albumin (g/dL)  Date Value  03/09/2019 3.5    Assessment/Plan: 28 year old male with a medical history as indicated below who presented to the ED after taking 38 tablets of Phenergan, 19 tablets of losartan, 42 tablets of amlodipine, and 78 tablets of clonazepam and noted electrolyte abnormalities. He is currently on an insulin drip Since admission he has received the following electrolyte supplementation: --2 grams IV calcium gluconate --LR with 40 mEq/L at 75 mL/hr (currently running) --4 grams IV magnesium sulfate --40 mEq po KCl --70 mEq IV KCl --45 mmol potassium phosphate  Stop LR with 40 mEq/L at 75 mL/hr

## 2019-03-09 NOTE — Progress Notes (Addendum)
MEDICATION RELATED CONSULT NOTE - INITIAL   Pharmacy Consult for insulin drip monitoring Indication: calcium channel blocker overdose  No Known Allergies  Patient Measurements: Height: 5\' 7"  (170.2 cm) Weight: 200 lb (90.7 kg) IBW/kg (Calculated) : 66.1  Vital Signs: Temp: 98.3 F (36.8 C) (05/31 2322) Temp Source: Oral (05/31 2322) BP: 85/58 (06/01 0100) Pulse Rate: 89 (06/01 0000) Intake/Output from previous day: No intake/output data recorded. Intake/Output from this shift: No intake/output data recorded.  Labs: Recent Labs    03/08/19 2333  WBC 14.8*  HGB 14.3  HCT 41.8  PLT 406*  CREATININE 1.26*  MG 2.0  ALBUMIN 4.7  PROT 7.7  AST 30  ALT 36  ALKPHOS 64  BILITOT 0.8   Estimated Creatinine Clearance: 92 mL/min (A) (by C-G formula based on SCr of 1.26 mg/dL (H)).   Microbiology: Recent Results (from the past 720 hour(s))  SARS Coronavirus 2 (CEPHEID - Performed in [place] hospital lab), Hosp Order     Status: None   Collection Time: 03/08/19 11:48 PM  Result Value Ref Range Status   SARS Coronavirus 2 NEGATIVE NEGATIVE Final    Comment: (NOTE) If result is NEGATIVE SARS-CoV-2 target nucleic acids are NOT DETECTED. The SARS-CoV-2 RNA is generally detectable in upper and lower  respiratory specimens during the acute phase of infection. The lowest  concentration of SARS-CoV-2 viral copies this assay can detect is 250  copies / mL. A negative result does not preclude SARS-CoV-2 infection  and should not be used as the sole basis for treatment or other  patient management decisions.  A negative result may occur with  improper specimen collection / handling, submission of specimen other  than nasopharyngeal swab, presence of viral mutation(s) within the  areas targeted by this assay, and inadequate number of viral copies  (<250 copies / mL). A negative result must be combined with clinical  observations, patient history, and epidemiological  information. If result is POSITIVE SARS-CoV-2 target nucleic acids are DETECTED. The SARS-CoV-2 RNA is generally detectable in upper and lower  respiratory specimens dur ing the acute phase of infection.  Positive  results are indicative of active infection with SARS-CoV-2.  Clinical  correlation with patient history and other diagnostic information is  necessary to determine patient infection status.  Positive results do  not rule out bacterial infection or co-infection with other viruses. If result is PRESUMPTIVE POSTIVE SARS-CoV-2 nucleic acids MAY BE PRESENT.   A presumptive positive result was obtained on the submitted specimen  and confirmed on repeat testing.  While 2019 novel coronavirus  (SARS-CoV-2) nucleic acids may be present in the submitted sample  additional confirmatory testing may be necessary for epidemiological  and / or clinical management purposes  to differentiate between  SARS-CoV-2 and other Sarbecovirus currently known to infect humans.  If clinically indicated additional testing with an alternate test  methodology [phone]) is advised. The SARS-CoV-2 RNA is generally  detectable in upper and lower respiratory sp ecimens during the acute  phase of infection. The expected result is Negative. Fact Sheet for Patients:  StrictlyIdeas.no Fact Sheet for Healthcare Providers: BankingDealers.co.za This test is not yet approved or cleared by the [place] FDA and has been authorized for detection and/or diagnosis of SARS-CoV-2 by FDA under an Emergency Use Authorization (EUA).  This EUA will remain in effect (meaning this test can be used) for the duration of the COVID-19 declaration under Section 564(b)(1) of the Act, 21 U.S.C. section 360bbb-3(b)(1), unless the authorization  is terminated or revoked sooner. Performed at [facility], [address], [place],  [address]     Medical  History: Past Medical History:  Diagnosis Date  . Hypertension   . IBS (irritable bowel syndrome)     Medications:  Scheduled:  . glucagon (human recombinant)  3 mg Intravenous Once  . potassium chloride  40 mEq Oral Once    Assessment: Patient admitted for overdose d/t SI, patient says he was " feeling overwhelmed" and states he took 38 phenergan, 19 losartan, 42 amlodipine, and 78 clonazepam. Med rec states he takes exforge which is amlodipine w/ combo valsartan.   On arrival patient is tachycardic and hypotensive w/ systolics of 79 - 85.  Patient's initial K is 3.2, Glu 136, initial Scr is 1.26 (no previous to compare).   Goal of Therapy:  Reversal and/or prevention of CCB complications including hypotension, bradycardia, hyperglycemia and/or hypoglycemia s/t insulin therapy. Normalization of K >4.0 considering patient is on insulin infusion Normalization of electrolytes  Plan:  Patient initially received calcium gluconate 1g IV x 1 and glucagon 3 mg IV x 1 Patient is being started on insulin infusion at 1 unit/kg/hour per ER MD. KCI 10 mEq IV x 1 and KCI 40 mEq PO x 1 given and will need further supplementation considering baseline K is 3.2.  Per ICU RN patient did not receive that one run of KCI. Will give patient KCI 10 mEq IV x 3 and monitor w/ q4h labs. Patient is also on D10 infusion @ 5 mL/kg/hr or ~ 0.5 g dextrose/kg/hr Will continue to monitor electrolytes q4h and CBGs q1h while on insulin and will replace electrolytes as needed.  Tobie Lords, PharmD, BCPS Clinical Pharmacist 03/09/2019,1:28 AM

## 2019-03-09 NOTE — Progress Notes (Signed)
Patient continues to be hypotensive. Epinephrine drip started.

## 2019-03-09 NOTE — Progress Notes (Signed)
Cartridge change made no difference with machine pressures.  Dr. Vanetta Mulders verbalized he would assess and reposition left femoral line.

## 2019-03-09 NOTE — Progress Notes (Signed)
Patient manual and arterial pressures dropping.  Order obtained for neopsynepherine.

## 2019-03-09 NOTE — H&P (Signed)
[facility] NAME: Glenn Rasmussen    MR#:  [id number]  DATE OF BIRTH:  1991/07/05  DATE OF ADMISSION:  03/08/2019  PRIMARY CARE PHYSICIAN: Maeola Sarah, MD   REQUESTING/REFERRING PHYSICIAN: Karma Greaser, MD  CHIEF COMPLAINT:   Chief Complaint  Patient presents with  . Drug Overdose    HISTORY OF PRESENT ILLNESS:  Glenn Rasmussen  is a 28 y.o. male who presents with chief complaint as above.  Patient presents to the ED reporting multiple drug overdose.  He states that he is depressed and "just does not care about life anymore".  He states that he took a number of medications tonight including benzodiazepine, calcium channel blocker, Phenergan, amlodipine, and "just did not care what happened".  He is currently hypotensive, though awake and alert.  Treatment for his overdose was initiated in the ED and hospitalist were called for admission.  PAST MEDICAL HISTORY:   Past Medical History:  Diagnosis Date  . Hypertension   . IBS (irritable bowel syndrome)      PAST SURGICAL HISTORY:   Past Surgical History:  Procedure Laterality Date  . APPENDECTOMY    . CHOLECYSTECTOMY    . TONSILLECTOMY       SOCIAL HISTORY:   Social History   Tobacco Use  . Smoking status: Never Smoker  . Smokeless tobacco: Never Used  Substance Use Topics  . Alcohol use: No    Frequency: Never     FAMILY HISTORY:   Family History  Problem Relation Age of Onset  . Lung cancer Mother   . Lung cancer Father      DRUG ALLERGIES:  No Known Allergies  MEDICATIONS AT HOME:   Prior to Admission medications   Medication Sig Start Date End Date Taking? Authorizing Provider  amLODipine-valsartan (EXFORGE) 10-320 MG tablet Take 1 tablet by mouth daily.    [provider]  clonazePAM (KLONOPIN) 0.5 MG tablet TK 1 T PO BID UTD 07/31/18   [provider]  desipramine (NOPRAMIN) 150 MG tablet Take 150 mg by mouth at bedtime.     [provider]  dicyclomine (BENTYL) 20 MG tablet Take 20 mg by mouth every 6 (six) hours.    [provider]  eszopiclone (LUNESTA) 1 MG TABS tablet Take 1 mg by mouth at bedtime as needed for sleep. Take immediately before bedtime    [provider]  hydrochlorothiazide (HYDRODIURIL) 12.5 MG tablet Take 12.5 mg by mouth daily.    [provider]  lisdexamfetamine (VYVANSE) 30 MG capsule TK 1 C PO QD UTD 07/09/18   [provider]  losartan (COZAAR) 100 MG tablet Take by mouth.    [provider]  oxyCODONE-acetaminophen (PERCOCET/ROXICET) 5-325 MG tablet Take 1 tablet by mouth every 8 (eight) hours as needed for severe pain. 02/09/19   Coral Spikes, DO  promethazine (PHENERGAN) 25 MG tablet Take 25 mg by mouth every 6 (six) hours as needed for nausea or vomiting.    [provider]  traZODone (DESYREL) 100 MG tablet TK 2 AND 1/2 TS PO HS PRN 07/18/18   [provider]    REVIEW OF SYSTEMS:  Review of Systems  Constitutional: Negative for chills, fever, malaise/fatigue and weight loss.  HENT: Negative for ear pain, hearing loss and tinnitus.   Eyes: Negative for blurred vision, double vision, pain and redness.  Respiratory: Negative for cough, hemoptysis and shortness of breath.   Cardiovascular: Negative for chest  pain, palpitations, orthopnea and leg swelling.  Gastrointestinal: Negative for abdominal pain, constipation, diarrhea, nausea and vomiting.  Genitourinary: Negative for dysuria, frequency and hematuria.  Musculoskeletal: Negative for back pain, joint pain and neck pain.  Skin:       No acne, rash, or lesions  Neurological: Positive for headaches. Negative for dizziness, tremors, focal weakness and weakness.  Endo/Heme/Allergies: Negative for polydipsia. Does not bruise/bleed easily.  Psychiatric/Behavioral: Positive for depression and suicidal ideas. The patient is not nervous/anxious and does not have  insomnia.      VITAL SIGNS:   Vitals:   03/08/19 2322 03/08/19 2330 03/09/19 0000 03/09/19 0100  BP: (!) 104/38 (!) 100/42 (!) 79/38 (!) 85/58  Pulse: (!) 107 (!) 109 89   Resp: (!) 31 (!) 28 (!) 29 (!) 26  Temp: 98.3 F (36.8 C)     TempSrc: Oral     SpO2: 96% 96% 93%   Weight:      Height:       Wt Readings from Last 3 Encounters:  03/08/19 90.7 kg  02/09/19 90.7 kg  09/07/17 95.3 kg    PHYSICAL EXAMINATION:  Physical Exam  Vitals reviewed. Constitutional: He is oriented to person, place, and time. He appears well-developed and well-nourished. No distress.  HENT:  Head: Normocephalic and atraumatic.  Mouth/Throat: Oropharynx is clear and moist.  Eyes: Pupils are equal, round, and reactive to light. Conjunctivae and EOM are normal. No scleral icterus.  Neck: Normal range of motion. Neck supple. No JVD present. No thyromegaly present.  Cardiovascular: Normal rate, regular rhythm and intact distal pulses. Exam reveals no gallop and no friction rub.  No murmur heard. Respiratory: Effort normal and breath sounds normal. No respiratory distress. He has no wheezes. He has no rales.  GI: Soft. Bowel sounds are normal. He exhibits no distension. There is no abdominal tenderness.  Musculoskeletal: Normal range of motion.        General: No edema.     Comments: No arthritis, no gout  Lymphadenopathy:    He has no cervical adenopathy.  Neurological: He is alert and oriented to person, place, and time. No cranial nerve deficit.  No dysarthria, no aphasia  Skin: Skin is warm and dry. No rash noted. No erythema.  Psychiatric:  Patient has active suicidal ideation    LABORATORY PANEL:   CBC Recent Labs  Lab 03/08/19 2333  WBC 14.8*  HGB 14.3  HCT 41.8  PLT 406*   ------------------------------------------------------------------------------------------------------------------  Chemistries  Recent Labs  Lab 03/08/19 2333  NA 140  K 3.2*  CL 108  CO2 21*  GLUCOSE  136*  BUN 9  CREATININE 1.26*  CALCIUM 9.1  MG 2.0  AST 30  ALT 36  ALKPHOS 64  BILITOT 0.8   ------------------------------------------------------------------------------------------------------------------  Cardiac Enzymes Recent Labs  Lab 03/08/19 2333  TROPONINI <0.03   ------------------------------------------------------------------------------------------------------------------  RADIOLOGY:  No results found.  EKG:   Orders placed or performed during the hospital encounter of 03/08/19  . ED EKG  . ED EKG  . ED EKG  . ED EKG    IMPRESSION AND PLAN:  Principal Problem:   Multiple drug overdose -glucagon and calcium given in the ED, insulin drip started, admit to ICU for close monitoring.  Patient is somewhat hypotensive, IV fluids in place. Active Problems:   Suicide attempt [facility]) -psychiatry consult placed by ED physician   AKI (acute kidney injury) ([place]) -IV fluids as above, avoid nephrotoxins and monitor for improvement   HTN (  hypertension) -patient is currently hypotensive, hold antihypertensives for now  Chart review performed and case discussed with ED provider. Labs, imaging and/or ECG reviewed by provider and discussed with patient/family. Management plans discussed with the patient and/or family.  COVID-19 status: Test pending  DVT PROPHYLAXIS: SubQ lovenox   GI PROPHYLAXIS:  None  ADMISSION STATUS: Inpatient     CODE STATUS: Full  TOTAL CRITICAL CARE TIME TAKING CARE OF THIS PATIENT: 50 minutes.   This patient was evaluated in the context of the global COVID-19 pandemic, which necessitated consideration that the patient might be at risk for infection with the SARS-CoV-2 virus that causes COVID-19. Institutional protocols and algorithms that pertain to the evaluation of patients at risk for COVID-19 are in a state of rapid change based on information released by regulatory bodies including the CDC and federal and state organizations. These  policies and algorithms were followed to the best of this provider's knowledge to date during the patient's care at this facility.  Ethlyn Daniels 03/09/2019, 1:17 AM  [facility]  Office  [phone]  CC: Primary care physician; Maeola Sarah, MD  Note:  This document was prepared using Dragon voice recognition software and may include unintentional dictation errors.

## 2019-03-09 NOTE — Procedures (Signed)
Arterial Catheter Insertion Procedure Note Glenn Rasmussen [id number] 1991/06/05  Procedure: Insertion of Arterial Catheter  Indications: Blood pressure monitoring and Frequent blood sampling  Procedure Details Consent: Unable to obtain consent because of emergent medical necessity.  Time Out: Verified patient identification, verified procedure, site/side was marked, verified correct patient position, special equipment/implants available, medications/allergies/relevent history reviewed, required imaging and test results available.  Performed  Maximum sterile technique was used including antiseptics, cap, gloves, gown, hand hygiene, mask and sheet. Skin prep: Chlorhexidine; local anesthetic administered 20 gauge catheter was inserted into right femoral artery using the Seldinger technique. ULTRASOUND GUIDANCE USED: YES Evaluation Blood flow good; BP tracing good. Complications: No apparent complications.  Procedure performed under direct supervision of Dr. Lanney Gins Ultrasound utilized for realtime vessel cannulation Glenn Rasmussen 03/09/2019

## 2019-03-09 NOTE — Progress Notes (Signed)
Dr. Vanetta Mulders has no recommendations for right femoral CRRT access.  Will attempt cartridge change.

## 2019-03-09 NOTE — Consult Note (Signed)
[address] [place], [place] [address] Phone [phone]. FAx [phone]  Date: 03/09/2019                  Patient Name:  Glenn Rasmussen  MRN: [id number]  DOB: 07-16-1991  Age / Sex: 28 y.o., male         PCP: Maeola Sarah, MD                 Service Requesting Consult: IM/ Gladstone Lighter, MD                 Reason for Consult: ARF            History of Present Illness: Patient is a 28 y.o. male with medical problems of HTN, who was admitted to [facility] on 03/08/2019 for evaluation of drug overdose.  Patient was brought to the emergency room via [facility] for drug overdose due to suicidal thoughts.  According to ER notes, patient took multiple tablets of Phenergan, losartan, amlodipine, clonidine.  See triage notes for detail. Patient has remained persistently hypotensive despite requiring multiple pressors.  Currently being managed with glucagon, calcium, charcoal, in consultation with [facility].  ICU team has requested nephrology evaluation for oliguria, acute kidney injury Creatinine upon admission was 1.26 which has increased to 3.38 in less than 24 hours Other electrolyte abnormalities include low phosphorus which after replacement was 8.0 Patient was intubated for airway protection.  At present with minimal sedation, he is able to follow simple commands and able to scribble a few words on a notepad.   Medications: Outpatient medications: Medications Prior to Admission  Medication Sig Dispense Refill Last Dose  . amLODipine-valsartan (EXFORGE) 10-320 MG tablet Take 1 tablet by mouth daily.     . clonazePAM (KLONOPIN) 0.5 MG tablet TK 1 T PO BID UTD     . desipramine (NOPRAMIN) 150 MG tablet Take 150 mg by mouth at bedtime.     . dicyclomine (BENTYL) 20 MG tablet Take 20 mg by mouth every 6 (six) hours.     . eszopiclone (LUNESTA) 1 MG TABS tablet Take 1 mg by mouth at bedtime as needed for sleep. Take immediately before bedtime     .  hydrochlorothiazide (HYDRODIURIL) 12.5 MG tablet Take 12.5 mg by mouth daily.     Marland Kitchen lisdexamfetamine (VYVANSE) 30 MG capsule TK 1 C PO QD UTD     . losartan (COZAAR) 100 MG tablet Take by mouth.     . oxyCODONE-acetaminophen (PERCOCET/ROXICET) 5-325 MG tablet Take 1 tablet by mouth every 8 (eight) hours as needed for severe pain. 6 tablet 0   . promethazine (PHENERGAN) 25 MG tablet Take 25 mg by mouth every 6 (six) hours as needed for nausea or vomiting.     . traZODone (DESYREL) 100 MG tablet TK 2 AND 1/2 TS PO HS PRN       Current medications: Current Facility-Administered Medications  Medication Dose Route Frequency Provider Last Rate Last Dose  . bisacodyl (DULCOLAX) suppository 10 mg  10 mg Rectal Daily PRN Tukov-Yual, Magdalene S, NP      . chlorhexidine gluconate (MEDLINE KIT) (PERIDEX) 0.12 % solution 15 mL  15 mL Mouth Rinse BID Tukov-Yual, Magdalene S, NP   15 mL at 03/09/19 0800  . Chlorhexidine Gluconate Cloth 2 % PADS 6 each  6 each Topical Q0600 Lance Coon, MD   6 each at 03/09/19 [phone]  . dextrose 50 % solution 45.35 g  0.5 g/kg  Intravenous Once PRN Lance Coon, MD      . Derrill Memo ON 03/10/2019] enoxaparin (LOVENOX) injection 40 mg  40 mg Subcutaneous Q24H Charlett Nose, RPH      . EPINEPHrine (ADRENALIN) 8 mg in dextrose 5 % 250 mL (0.032 mg/mL) infusion  0.5-20 mcg/min Intravenous Titrated Ottie Glazier, MD 5.63 mL/hr at 03/09/19 1200 3 mcg/min at 03/09/19 1200  . fentaNYL (SUBLIMAZE) bolus via infusion 50 mcg  50 mcg Intravenous Q15 min PRN Tukov-Yual, Magdalene S, NP      . fentaNYL 2555mg in NS 2564m(1025mml) infusion-PREMIX  50-200 mcg/hr Intravenous Continuous Tukov-Yual, Magdalene S, NP 5 mL/hr at 03/09/19 1200 50 mcg/hr at 03/09/19 1200  . glucagon (human recombinant) (GLUCAGEN) 5 mg in dextrose 5 % 50 mL (0.1 mg/mL) infusion  1 mg/hr Intravenous Continuous Aleskerov, Fuad, MD      . heparin injection 1,000-6,000 Units  1,000-6,000 Units CRRT PRN SinMurlean IbaMD      . insulin regular, human (MYXREDLIN) 100 units/ 100 mL infusion   Intravenous Continuous Aleskerov, Fuad, MD      . ipratropium-albuterol (DUONEB) 0.5-2.5 (3) MG/3ML nebulizer solution 3 mL  3 mL Nebulization Q6H Tukov-Yual, Magdalene S, NP   3 mL at 03/09/19 1314  . lactated ringers 1,000 mL with potassium chloride 40 mEq infusion   Intravenous Continuous AleOttie GlazierD   Stopped at 03/09/19 1051  . MEDLINE mouth rinse  15 mL Mouth Rinse 10 times per day Tukov-Yual, Magdalene S, NP   15 mL at 03/09/19 1339  . midazolam (VERSED) injection 2-4 mg  2-4 mg Intravenous Q2H PRN Tukov-Yual, Magdalene S, NP   2 mg at 03/09/19 1417  . norepinephrine (LEVOPHED) 16 mg in 250m52memix infusion  0-65 mcg/min Intravenous Titrated Tukov-Yual, Magdalene S, NP 60.9 mL/hr at 03/09/19 1502 65 mcg/min at 03/09/19 1502  . pantoprazole (PROTONIX) injection 40 mg  40 mg Intravenous Q12H AlesOttie Glazier   40 mg at 03/09/19 1339  . piperacillin-tazobactam (ZOSYN) IVPB 3.375 g  3.375 g Intravenous Q8H Tukov-Yual, Magdalene S, NP 12.5 mL/hr at 03/09/19 1417 3.375 g at 03/09/19 1417  . potassium PHOSPHATE 45 mmol in dextrose 5 % 500 mL infusion  45 mmol Intravenous Once KaliGladstone Lighter 125 mL/hr at 03/09/19 1000 45 mmol at 03/09/19 1000  . pureflow IV solution for Dialysis   CRRT Continuous SingCandiss Norsermeet, MD      . sennosides (SENOKOT) 8.8 MG/5ML syrup 5 mL  5 mL Per Tube BID PRN Tukov-Yual, Magdalene S, NP      . sodium chloride flush (NS) 0.9 % injection 10-40 mL  10-40 mL Intracatheter Q12H Tukov-Yual, Magdalene S, NP   10 mL at 03/09/19 1122  . sodium chloride flush (NS) 0.9 % injection 10-40 mL  10-40 mL Intracatheter PRN Tukov-Yual, Magdalene S, NP      . vasopressin (PITRESSIN) 40 Units in sodium chloride 0.9 % 250 mL (0.16 Units/mL) infusion  0.03 Units/min Intravenous Continuous Aleskerov, Fuad, MD 11.25 mL/hr at 03/09/19 0900 0.03 Units/min at 03/09/19 0900      Allergies: No Known  Allergies    Past Medical History: Past Medical History:  Diagnosis Date  . Hypertension   . IBS (irritable bowel syndrome)      Past Surgical History: Past Surgical History:  Procedure Laterality Date  . APPENDECTOMY    . CHOLECYSTECTOMY    . TONSILLECTOMY       Family History: Family History  Problem Relation Age of Onset  .  Lung cancer Mother   . Lung cancer Father      Social History: Social History   Socioeconomic History  . Marital status: Single    Spouse name: Not on file  . Number of children: Not on file  . Years of education: Not on file  . Highest education level: Not on file  Occupational History  . Not on file  Social Needs  . Financial resource strain: Not on file  . Food insecurity:    Worry: Not on file    Inability: Not on file  . Transportation needs:    Medical: Not on file    Non-medical: Not on file  Tobacco Use  . Smoking status: Never Smoker  . Smokeless tobacco: Never Used  Substance and Sexual Activity  . Alcohol use: No    Frequency: Never  . Drug use: No  . Sexual activity: Not on file  Lifestyle  . Physical activity:    Days per week: Not on file    Minutes per session: Not on file  . Stress: Not on file  Relationships  . Social connections:    Talks on phone: Not on file    Gets together: Not on file    Attends religious service: Not on file    Active member of club or organization: Not on file    Attends meetings of clubs or organizations: Not on file    Relationship status: Not on file  . Intimate partner violence:    Fear of current or ex partner: Not on file    Emotionally abused: Not on file    Physically abused: Not on file    Forced sexual activity: Not on file  Other Topics Concern  . Not on file  Social History Narrative  . Not on file     Review of Systems: not available Gen:  HEENT:  CV:  Resp:  GI: GU :  MS:  Derm:    Psych: Heme:  Neuro:  Endocrine  Vital Signs: Blood pressure (!)  96/40, pulse 92, temperature 98.4 F (36.9 C), temperature source Oral, resp. rate (!) 24, height 5' 7.01" (1.702 m), weight 90.7 kg, SpO2 100 %.   Intake/Output Summary (Last 24 hours) at 03/09/2019 1505 Last data filed at 03/09/2019 1200 Gross per 24 hour  Intake 6197.37 ml  Output 200 ml  Net 5997.37 ml    Weight trends: [employer]   03/08/19 2319  Weight: 90.7 kg   General Appearance:  critically ill Head:   Normocephalic, without obvious abnormality, atraumatic Eyes/ENT:  conjunctiva/corneas clear, ETT, OGT in place Neck:  Supple,  thyroid: not enlarged, no JVD Back:    no CVA tenderness Lungs:   Clear to auscultation bilaterally, Vent assisted Heart:   Tachycardic, no rub or gallop Abdomen:   Soft, non-tender, bowel sounds active  Extremities: no cyanosis or edema Skin:  Skin color, texture, turgor normal, no rashes or lesions Neurologic: able to follow simple commands   Lab results: Basic Metabolic Panel: Recent Labs  Lab 03/08/19 2333 03/09/19 0457 03/09/19 0819 03/09/19 1340  NA 140 135 134* 132*  K 3.2* 2.7* 3.2* 4.8  CL 108 106 106 101  CO2 21* 17* 16* 11*  GLUCOSE 136* 271* 288* 443*  BUN '9 11 13 17  '$ CREATININE 1.26* 2.09* 2.47* 3.38*  CALCIUM 9.1 8.5* 8.4* 8.2*  MG 2.0 1.8 1.6*  --   PHOS  --  1.5* <1.0* 8.0*    Liver Function Tests: Recent  Labs  Lab 03/09/19 0457 03/09/19 1340  AST 57*  --   ALT 50*  --   ALKPHOS 60  --   BILITOT 1.1  --   PROT 6.4*  --   ALBUMIN 3.9 3.5   Recent Labs  Lab 03/08/19 2333  LIPASE 30   No results for input(s): AMMONIA in the last 168 hours.  CBC: Recent Labs  Lab 03/08/19 2333 03/09/19 0457  WBC 14.8* 32.7*  NEUTROABS 7.6  --   HGB 14.3 12.6*  HCT 41.8 37.9*  MCV 78.9* 82.2  PLT 406* 393    Cardiac Enzymes: Recent Labs  Lab 03/08/19 2333  TROPONINI <0.03    BNP: Invalid input(s): POCBNP  CBG: Recent Labs  Lab 03/09/19 0610 03/09/19 0737 03/09/19 0829 03/09/19 0912  03/09/19 0930  GLUCAP 313* 232* 240* 245* 224*    Microbiology: Recent Results (from the past 720 hour(s))  SARS Coronavirus 2 (CEPHEID - Performed in [place] hospital lab), Hosp Order     Status: None   Collection Time: 03/08/19 11:48 PM  Result Value Ref Range Status   SARS Coronavirus 2 NEGATIVE NEGATIVE Final    Comment: (NOTE) If result is NEGATIVE SARS-CoV-2 target nucleic acids are NOT DETECTED. The SARS-CoV-2 RNA is generally detectable in upper and lower  respiratory specimens during the acute phase of infection. The lowest  concentration of SARS-CoV-2 viral copies this assay can detect is 250  copies / mL. A negative result does not preclude SARS-CoV-2 infection  and should not be used as the sole basis for treatment or other  patient management decisions.  A negative result may occur with  improper specimen collection / handling, submission of specimen other  than nasopharyngeal swab, presence of viral mutation(s) within the  areas targeted by this assay, and inadequate number of viral copies  (<250 copies / mL). A negative result must be combined with clinical  observations, patient history, and epidemiological information. If result is POSITIVE SARS-CoV-2 target nucleic acids are DETECTED. The SARS-CoV-2 RNA is generally detectable in upper and lower  respiratory specimens dur ing the acute phase of infection.  Positive  results are indicative of active infection with SARS-CoV-2.  Clinical  correlation with patient history and other diagnostic information is  necessary to determine patient infection status.  Positive results do  not rule out bacterial infection or co-infection with other viruses. If result is PRESUMPTIVE POSTIVE SARS-CoV-2 nucleic acids MAY BE PRESENT.   A presumptive positive result was obtained on the submitted specimen  and confirmed on repeat testing.  While 2019 novel coronavirus  (SARS-CoV-2) nucleic acids may be present in the submitted  sample  additional confirmatory testing may be necessary for epidemiological  and / or clinical management purposes  to differentiate between  SARS-CoV-2 and other Sarbecovirus currently known to infect humans.  If clinically indicated additional testing with an alternate test  methodology [phone]) is advised. The SARS-CoV-2 RNA is generally  detectable in upper and lower respiratory sp ecimens during the acute  phase of infection. The expected result is Negative. Fact Sheet for Patients:  StrictlyIdeas.no Fact Sheet for Healthcare Providers: BankingDealers.co.za This test is not yet approved or cleared by the [place] FDA and has been authorized for detection and/or diagnosis of SARS-CoV-2 by FDA under an Emergency Use Authorization (EUA).  This EUA will remain in effect (meaning this test can be used) for the duration of the COVID-19 declaration under Section 564(b)(1) of the Act, 21 U.S.C. section 360bbb-3(b)(1),  unless the authorization is terminated or revoked sooner. Performed at [facility], [address] [place], [place] [address]   MRSA PCR Screening     Status: None   Collection Time: 03/09/19  2:48 AM  Result Value Ref Range Status   MRSA by PCR NEGATIVE NEGATIVE Final    Comment:        The GeneXpert MRSA Assay (FDA approved for NASAL specimens only), is one component of a comprehensive MRSA colonization surveillance program. It is not intended to diagnose MRSA infection nor to guide or monitor treatment for MRSA infections. Performed at [facility], [address] [place], [place] [address]      Coagulation Studies: Recent Labs    03/08/19 Dec 22, 2331  LABPROT 12.0  INR 0.9    Urinalysis: Recent Labs    03/08/19 2326  COLORURINE YELLOW*  LABSPEC 1.021  PHURINE 5.0  GLUCOSEU NEGATIVE  HGBUR NEGATIVE  BILIRUBINUR NEGATIVE  KETONESUR NEGATIVE  PROTEINUR 30*  NITRITE NEGATIVE   LEUKOCYTESUR NEGATIVE        Imaging: Dg Abd 1 View  Result Date: 03/09/2019 CLINICAL DATA:  28 year old male presenting after drug overdose. EXAM: ABDOMEN - 1 VIEW COMPARISON:  Portable chest today. CT Abdomen and Pelvis 05/02/2007. FINDINGS: Semi upright AP view at 0510 hours. Enteric tube side hole is at the level of the gastric body. Nonobstructed bowel-gas pattern. No osseous abnormality identified. IMPRESSION: Enteric tube side hole at the level of the gastric body. Normal visible bowel gas pattern. Electronically Signed   By: Genevie Ann M.D.   On: 03/09/2019 05:46   Dg Chest Port 1 View  Result Date: 03/09/2019 CLINICAL DATA:  28 year old male presenting after drug overdose. EXAM: PORTABLE CHEST 1 VIEW COMPARISON:  02/09/2019 and earlier. FINDINGS: Portable AP semi upright view at 0510 hours. Intubated, endotracheal tube tip at the level the clavicles. Enteric tube courses to the abdomen, tip not included. Normal cardiac size and mediastinal contours. Patchy and confluent perihilar and lung base opacity. No superimposed pneumothorax. No overt edema. No pleural effusion identified. Paucity of bowel gas in the upper abdomen. No osseous abnormality identified. IMPRESSION: 1. Endotracheal tube tip at the level the clavicles. Enteric tube courses to the abdomen, tip not included. 2. Patchy and confluent perihilar and lung base opacity. Consider aspiration. No pulmonary edema or pleural effusion. Electronically Signed   By: Genevie Ann M.D.   On: 03/09/2019 05:45      Assessment & Plan: Pt is a 28 y.o. caucasian  male with HTN , was admitted on 03/08/2019 with ARF, acute resp failure secondary to drug overdose  1.  Oliguric acute renal failure Minimal urine output Serum creatinine has rapidly increased from 1.26 at the time of admission to 3.38 Patient is critically ill, severely hypotensive on multiple pressors Not responding to IV fluid restriction.  Patient has worsening acidosis with CO2 of 11  at this time Recommend continuous renal replacement therapy for metabolic optimization and possibility of removing some amount of overdosed medications Discussed with the ICU team who will place dialysis catheter CRRT orders placed  2.  Acute respiratory failure Ventilator assisted at present  3. Metabolic acidosis -Expected to improve with hemodialysis support      LOS: 0 Jisela Merlino 6/1/20203:05 PM    Note: This note was prepared with Dragon dictation. Any transcription errors are unintentional

## 2019-03-09 NOTE — ED Notes (Signed)
IVC/SOC ordered & called

## 2019-03-09 NOTE — Progress Notes (Signed)
Dr. Lanney Gins at bedside and repositioned right femoral vascular access.  No changes with CRRT machine pressures.  Dr. Lanney Gins verbalized he would ask Nurse practitioner to assess for another line to be placed.

## 2019-03-09 NOTE — Progress Notes (Signed)
No improvement noted in BP.  Vasopressin started per MD order.

## 2019-03-09 NOTE — Progress Notes (Signed)
[place] [facility] [place] NAME: Hugh Kamara    MR#:  [id number]  DATE OF BIRTH:  10-20-1990  SUBJECTIVE:  CHIEF COMPLAINT:   Chief Complaint  Patient presents with  . Drug Overdose   -Admitted with intentional overdose of multiple medications.  Intubated for airway protection and on pressors now  REVIEW OF SYSTEMS:  Review of Systems  Unable to perform ROS: Critical illness    DRUG ALLERGIES:  No Known Allergies  VITALS:  Blood pressure (!) 93/45, pulse 90, temperature 98.4 F (36.9 C), temperature source Oral, resp. rate (!) 30, height 5' 7.01" (1.702 m), weight 90.7 kg, SpO2 97 %.  PHYSICAL EXAMINATION:  Physical Exam   GENERAL:  28 y.o.-year-old critically ill-appearing patient lying in the bed with no acute distress.  EYES: Pupils equal, round, reactive to light and accommodation. No scleral icterus. Extraocular muscles intact.  HEENT: Head atraumatic, normocephalic. Oropharynx and nasopharynx clear.  NECK:  Supple, no jugular venous distention. No thyroid enlargement, no tenderness.  LUNGS: Normal breath sounds bilaterally, no wheezing, rales,rhonchi or crepitation. No use of accessory muscles of respiration.  Decreased bibasilar breath sounds CARDIOVASCULAR: S1, S2 normal. No murmurs, rubs, or gallops.  ABDOMEN: Soft, nontender, nondistended. Bowel sounds present. No organomegaly or mass.  EXTREMITIES: No pedal edema, cyanosis, or clubbing.  NEUROLOGIC: Patient is sedated.  Not responding.  PSYCHIATRIC: The patient is sedated SKIN: No obvious rash, lesion, or ulcer.    LABORATORY PANEL:   CBC Recent Labs  Lab 03/09/19 0457  WBC 32.7*  HGB 12.6*  HCT 37.9*  PLT 393   ------------------------------------------------------------------------------------------------------------------  Chemistries  Recent Labs  Lab 03/09/19 0457 03/09/19 0819  NA 135 134*  K 2.7* 3.2*  CL 106 106  CO2 17* 16*  GLUCOSE 271*  288*  BUN 11 13  CREATININE 2.09* 2.47*  CALCIUM 8.5* 8.4*  MG 1.8 1.6*  AST 57*  --   ALT 50*  --   ALKPHOS 60  --   BILITOT 1.1  --    ------------------------------------------------------------------------------------------------------------------  Cardiac Enzymes Recent Labs  Lab 03/08/19 2333  TROPONINI <0.03   ------------------------------------------------------------------------------------------------------------------  RADIOLOGY:  Dg Abd 1 View  Result Date: 03/09/2019 CLINICAL DATA:  28 year old male presenting after drug overdose. EXAM: ABDOMEN - 1 VIEW COMPARISON:  Portable chest today. CT Abdomen and Pelvis 05/02/2007. FINDINGS: Semi upright AP view at 0510 hours. Enteric tube side hole is at the level of the gastric body. Nonobstructed bowel-gas pattern. No osseous abnormality identified. IMPRESSION: Enteric tube side hole at the level of the gastric body. Normal visible bowel gas pattern. Electronically Signed   By: Genevie Ann M.D.   On: 03/09/2019 05:46   Dg Chest Port 1 View  Result Date: 03/09/2019 CLINICAL DATA:  28 year old male presenting after drug overdose. EXAM: PORTABLE CHEST 1 VIEW COMPARISON:  02/09/2019 and earlier. FINDINGS: Portable AP semi upright view at 0510 hours. Intubated, endotracheal tube tip at the level the clavicles. Enteric tube courses to the abdomen, tip not included. Normal cardiac size and mediastinal contours. Patchy and confluent perihilar and lung base opacity. No superimposed pneumothorax. No overt edema. No pleural effusion identified. Paucity of bowel gas in the upper abdomen. No osseous abnormality identified. IMPRESSION: 1. Endotracheal tube tip at the level the clavicles. Enteric tube courses to the abdomen, tip not included. 2. Patchy and confluent perihilar and lung base opacity. Consider aspiration. No pulmonary edema or pleural effusion. Electronically Signed   By: Lemmie Evens  [place] Crane M.D.   On: 03/09/2019 05:45    EKG:   Orders placed or  performed during the hospital encounter of 03/08/19  . ED EKG  . ED EKG  . ED EKG  . ED EKG  . EKG 12-Lead  . EKG 12-Lead  . EKG 12-Lead  . EKG 12-Lead  . EKG 12-Lead  . EKG 12-Lead  . EKG 12-Lead  . EKG 12-Lead    ASSESSMENT AND PLAN:   28 year old male with past medical history significant for hypertension, IBS admitted secondary to overdose on Phenergan, losartan, Norvasc and Klonopin  1.  Acute respiratory failure-intubated for airway protection secondary to acute metabolic encephalopathy -Patient is currently on ventilator, management per ICU team -Minimal sedation with fentanyl but neurologically-not very intact at this time  2.  Intentional drug overdose-patient overdosed on 38 tablets of Phenergan, 19 tablets of losartan, 42 tablets of Norvasc and 78 tablets of Klonopin as he has mentioned it to EMS.  He apparently texted his friends to say goodbye, will need psychiatry consult after extubation. -Urine tox positive for benzos and tricyclics -Poison control has been contacted, management per ICU team at this time.  Extremely hypotensive -Per their recommendations, he is on insulin drip because of intake of calcium channel blocker.  Also on Levophed and vasopressin for hypertension  3.  Acute renal failure-likely ATN from hypotension.  Monitor closely  4.  Hypokalemia-being replaced  5.  Septic shock-concern for possible aspiration.  WBC is significantly elevated -Continue Zosyn  6.  DVT prophylaxis-on Lovenox  Overall poor prognosis at this time    All the records are reviewed and case discussed with Care Management/Social Workerr. Management plans discussed with the patient, family and they are in agreement.  CODE STATUS: Full code  TOTAL TIME TAKING CARE OF THIS PATIENT: 39 minutes.   POSSIBLE D/C IN ? DAYS, DEPENDING ON CLINICAL CONDITION.   Gladstone Lighter M.D on 03/09/2019 at 10:26 AM  Between 7am to 6pm - Pager - [phone]  After 6pm go to  [email or web address] - password [email or web address]  Office  [phone]  CC: Primary care physician; Maeola Sarah, MD

## 2019-03-10 ENCOUNTER — Inpatient Hospital Stay: Payer: BC Managed Care – PPO

## 2019-03-10 DIAGNOSIS — T461X1D Poisoning by calcium-channel blockers, accidental (unintentional), subsequent encounter: Secondary | ICD-10-CM

## 2019-03-10 DIAGNOSIS — T50911A Poisoning by multiple unspecified drugs, medicaments and biological substances, accidental (unintentional), initial encounter: Secondary | ICD-10-CM

## 2019-03-10 LAB — RENAL FUNCTION PANEL
Albumin: 3.4 g/dL — ABNORMAL LOW (ref 3.5–5.0)
Albumin: 2.9 g/dL — ABNORMAL LOW (ref 3.5–5.0)
Albumin: 2.9 g/dL — ABNORMAL LOW (ref 3.5–5.0)
Albumin: 2.8 g/dL — ABNORMAL LOW (ref 3.5–5.0)
Albumin: 2.9 g/dL — ABNORMAL LOW (ref 3.5–5.0)
Albumin: 2.9 g/dL — ABNORMAL LOW (ref 3.5–5.0)
Albumin: 3 g/dL — ABNORMAL LOW (ref 3.5–5.0)
Albumin: 2.9 g/dL — ABNORMAL LOW (ref 3.5–5.0)
Anion gap: 13 (ref 5–15)
Anion gap: 9 (ref 5–15)
Anion gap: 6 (ref 5–15)
Anion gap: 7 (ref 5–15)
Anion gap: 7 (ref 5–15)
Anion gap: 7 (ref 5–15)
Anion gap: 7 (ref 5–15)
Anion gap: 8 (ref 5–15)
BUN: 24 mg/dL — ABNORMAL HIGH (ref 6–20)
BUN: 27 mg/dL — ABNORMAL HIGH (ref 6–20)
BUN: 26 mg/dL — ABNORMAL HIGH (ref 6–20)
BUN: 27 mg/dL — ABNORMAL HIGH (ref 6–20)
BUN: 27 mg/dL — ABNORMAL HIGH (ref 6–20)
BUN: 27 mg/dL — ABNORMAL HIGH (ref 6–20)
BUN: 28 mg/dL — ABNORMAL HIGH (ref 6–20)
BUN: 28 mg/dL — ABNORMAL HIGH (ref 6–20)
CO2: 19 mmol/L — ABNORMAL LOW (ref 22–32)
CO2: 21 mmol/L — ABNORMAL LOW (ref 22–32)
CO2: 22 mmol/L (ref 22–32)
CO2: 20 mmol/L — ABNORMAL LOW (ref 22–32)
CO2: 21 mmol/L — ABNORMAL LOW (ref 22–32)
CO2: 21 mmol/L — ABNORMAL LOW (ref 22–32)
CO2: 21 mmol/L — ABNORMAL LOW (ref 22–32)
CO2: 21 mmol/L — ABNORMAL LOW (ref 22–32)
Calcium: 7.2 mg/dL — ABNORMAL LOW (ref 8.9–10.3)
Calcium: 7.1 mg/dL — ABNORMAL LOW (ref 8.9–10.3)
Calcium: 6.8 mg/dL — ABNORMAL LOW (ref 8.9–10.3)
Calcium: 7.3 mg/dL — ABNORMAL LOW (ref 8.9–10.3)
Calcium: 7.2 mg/dL — ABNORMAL LOW (ref 8.9–10.3)
Calcium: 7.5 mg/dL — ABNORMAL LOW (ref 8.9–10.3)
Calcium: 7.5 mg/dL — ABNORMAL LOW (ref 8.9–10.3)
Calcium: 7.5 mg/dL — ABNORMAL LOW (ref 8.9–10.3)
Chloride: 104 mmol/L (ref 98–111)
Chloride: 108 mmol/L (ref 98–111)
Chloride: 109 mmol/L (ref 98–111)
Chloride: 109 mmol/L (ref 98–111)
Chloride: 107 mmol/L (ref 98–111)
Chloride: 108 mmol/L (ref 98–111)
Chloride: 108 mmol/L (ref 98–111)
Chloride: 107 mmol/L (ref 98–111)
Creatinine, Ser: 4.09 mg/dL — ABNORMAL HIGH (ref 0.61–1.24)
Creatinine, Ser: 4.62 mg/dL — ABNORMAL HIGH (ref 0.61–1.24)
Creatinine, Ser: 4.57 mg/dL — ABNORMAL HIGH (ref 0.61–1.24)
Creatinine, Ser: 4.42 mg/dL — ABNORMAL HIGH (ref 0.61–1.24)
Creatinine, Ser: 4.56 mg/dL — ABNORMAL HIGH (ref 0.61–1.24)
Creatinine, Ser: 4.55 mg/dL — ABNORMAL HIGH (ref 0.61–1.24)
Creatinine, Ser: 4.48 mg/dL — ABNORMAL HIGH (ref 0.61–1.24)
Creatinine, Ser: 4.46 mg/dL — ABNORMAL HIGH (ref 0.61–1.24)
GFR calc Af Amer: 21 mL/min — ABNORMAL LOW (ref >60)
GFR calc Af Amer: 18 mL/min — ABNORMAL LOW (ref >60)
GFR calc Af Amer: 19 mL/min — ABNORMAL LOW (ref >60)
GFR calc Af Amer: 19 mL/min — ABNORMAL LOW (ref >60)
GFR calc Af Amer: 19 mL/min — ABNORMAL LOW (ref >60)
GFR calc Af Amer: 19 mL/min — ABNORMAL LOW (ref >60)
GFR calc Af Amer: 19 mL/min — ABNORMAL LOW (ref >60)
GFR calc Af Amer: 19 mL/min — ABNORMAL LOW (ref >60)
GFR calc non Af Amer: 18 mL/min — ABNORMAL LOW (ref >60)
GFR calc non Af Amer: 16 mL/min — ABNORMAL LOW (ref >60)
GFR calc non Af Amer: 16 mL/min — ABNORMAL LOW (ref >60)
GFR calc non Af Amer: 17 mL/min — ABNORMAL LOW (ref >60)
GFR calc non Af Amer: 16 mL/min — ABNORMAL LOW (ref >60)
GFR calc non Af Amer: 16 mL/min — ABNORMAL LOW (ref >60)
GFR calc non Af Amer: 16 mL/min — ABNORMAL LOW (ref >60)
GFR calc non Af Amer: 16 mL/min — ABNORMAL LOW (ref >60)
Glucose, Bld: 411 mg/dL — ABNORMAL HIGH (ref 70–99)
Glucose, Bld: 223 mg/dL — ABNORMAL HIGH (ref 70–99)
Glucose, Bld: 108 mg/dL — ABNORMAL HIGH (ref 70–99)
Glucose, Bld: 89 mg/dL (ref 70–99)
Glucose, Bld: 96 mg/dL (ref 70–99)
Glucose, Bld: 101 mg/dL — ABNORMAL HIGH (ref 70–99)
Glucose, Bld: 108 mg/dL — ABNORMAL HIGH (ref 70–99)
Glucose, Bld: 111 mg/dL — ABNORMAL HIGH (ref 70–99)
Phosphorus: 5.1 mg/dL — ABNORMAL HIGH (ref 2.5–4.6)
Phosphorus: 4.2 mg/dL (ref 2.5–4.6)
Phosphorus: 3.9 mg/dL (ref 2.5–4.6)
Phosphorus: 3.6 mg/dL (ref 2.5–4.6)
Phosphorus: 3.8 mg/dL (ref 2.5–4.6)
Phosphorus: 4 mg/dL (ref 2.5–4.6)
Phosphorus: 4.3 mg/dL (ref 2.5–4.6)
Phosphorus: 4.2 mg/dL (ref 2.5–4.6)
Potassium: 4.3 mmol/L (ref 3.5–5.1)
Potassium: 4.3 mmol/L (ref 3.5–5.1)
Potassium: 5.8 mmol/L — ABNORMAL HIGH (ref 3.5–5.1)
Potassium: 6.8 mmol/L (ref 3.5–5.1)
Potassium: 6.7 mmol/L (ref 3.5–5.1)
Potassium: 6.8 mmol/L (ref 3.5–5.1)
Potassium: 7 mmol/L (ref 3.5–5.1)
Potassium: 7 mmol/L (ref 3.5–5.1)
Sodium: 136 mmol/L (ref 135–145)
Sodium: 138 mmol/L (ref 135–145)
Sodium: 137 mmol/L (ref 135–145)
Sodium: 136 mmol/L (ref 135–145)
Sodium: 135 mmol/L (ref 135–145)
Sodium: 136 mmol/L (ref 135–145)
Sodium: 136 mmol/L (ref 135–145)
Sodium: 136 mmol/L (ref 135–145)

## 2019-03-10 LAB — GLUCOSE, CAPILLARY
Glucose-Capillary: 102 mg/dL — ABNORMAL HIGH (ref 70–99)
Glucose-Capillary: 105 mg/dL — ABNORMAL HIGH (ref 70–99)
Glucose-Capillary: 107 mg/dL — ABNORMAL HIGH (ref 70–99)
Glucose-Capillary: 116 mg/dL — ABNORMAL HIGH (ref 70–99)
Glucose-Capillary: 139 mg/dL — ABNORMAL HIGH (ref 70–99)
Glucose-Capillary: 157 mg/dL — ABNORMAL HIGH (ref 70–99)
Glucose-Capillary: 185 mg/dL — ABNORMAL HIGH (ref 70–99)
Glucose-Capillary: 234 mg/dL — ABNORMAL HIGH (ref 70–99)
Glucose-Capillary: 253 mg/dL — ABNORMAL HIGH (ref 70–99)
Glucose-Capillary: 260 mg/dL — ABNORMAL HIGH (ref 70–99)
Glucose-Capillary: 315 mg/dL — ABNORMAL HIGH (ref 70–99)
Glucose-Capillary: 329 mg/dL — ABNORMAL HIGH (ref 70–99)
Glucose-Capillary: 349 mg/dL — ABNORMAL HIGH (ref 70–99)
Glucose-Capillary: 427 mg/dL — ABNORMAL HIGH (ref 70–99)
Glucose-Capillary: 64 mg/dL — ABNORMAL LOW (ref 70–99)
Glucose-Capillary: 74 mg/dL (ref 70–99)
Glucose-Capillary: 77 mg/dL (ref 70–99)
Glucose-Capillary: 78 mg/dL (ref 70–99)
Glucose-Capillary: 84 mg/dL (ref 70–99)

## 2019-03-10 LAB — CBC
HCT: 34 % — ABNORMAL LOW (ref 39.0–52.0)
Hemoglobin: 11.4 g/dL — ABNORMAL LOW (ref 13.0–17.0)
MCH: 27.1 pg (ref 26.0–34.0)
MCHC: 33.5 g/dL (ref 30.0–36.0)
MCV: 81 fL (ref 80.0–100.0)
Platelets: 168 10*3/uL (ref 150–400)
RBC: 4.2 MIL/uL — ABNORMAL LOW (ref 4.22–5.81)
RDW: 13.2 % (ref 11.5–15.5)
WBC: 28.5 10*3/uL — ABNORMAL HIGH (ref 4.0–10.5)
nRBC: 0 % (ref 0.0–0.2)

## 2019-03-10 LAB — CBC WITH DIFFERENTIAL/PLATELET
Abs Immature Granulocytes: 0.95 10*3/uL — ABNORMAL HIGH (ref 0.00–0.07)
Basophils Absolute: 0.1 10*3/uL (ref 0.0–0.1)
Basophils Relative: 0 %
Eosinophils Absolute: 0 10*3/uL (ref 0.0–0.5)
Eosinophils Relative: 0 %
HCT: 33.1 % — ABNORMAL LOW (ref 39.0–52.0)
Hemoglobin: 11.4 g/dL — ABNORMAL LOW (ref 13.0–17.0)
Immature Granulocytes: 3 %
Lymphocytes Relative: 8 %
Lymphs Abs: 2.5 10*3/uL (ref 0.7–4.0)
MCH: 27.2 pg (ref 26.0–34.0)
MCHC: 34.4 g/dL (ref 30.0–36.0)
MCV: 79 fL — ABNORMAL LOW (ref 80.0–100.0)
Monocytes Absolute: 3 10*3/uL — ABNORMAL HIGH (ref 0.1–1.0)
Monocytes Relative: 10 %
Neutro Abs: 23.9 10*3/uL — ABNORMAL HIGH (ref 1.7–7.7)
Neutrophils Relative %: 79 %
Platelets: 240 10*3/uL (ref 150–400)
RBC: 4.19 MIL/uL — ABNORMAL LOW (ref 4.22–5.81)
RDW: 12.4 % (ref 11.5–15.5)
WBC: 30.4 10*3/uL — ABNORMAL HIGH (ref 4.0–10.5)
nRBC: 0 % (ref 0.0–0.2)

## 2019-03-10 LAB — BLOOD GAS, ARTERIAL
Acid-base deficit: 9.5 mmol/L — ABNORMAL HIGH (ref 0.0–2.0)
Acid-base deficit: 9.2 mmol/L — ABNORMAL HIGH (ref 0.0–2.0)
Acid-base deficit: 6.4 mmol/L — ABNORMAL HIGH (ref 0.0–2.0)
Bicarbonate: 19.6 mmol/L — ABNORMAL LOW (ref 20.0–28.0)
Bicarbonate: 18 mmol/L — ABNORMAL LOW (ref 20.0–28.0)
Bicarbonate: 22 mmol/L (ref 20.0–28.0)
FIO2: 100
FIO2: 1
FIO2: 1
MECHVT: 500 mL
MECHVT: 500 mL
MECHVT: 500 mL
O2 Saturation: 95.2 %
O2 Saturation: 97.8 %
O2 Saturation: 80.5 %
PEEP: 5 cmH2O
PEEP: 5 cmH2O
PEEP: 15 cmH2O
Patient temperature: 37
Patient temperature: 37
Patient temperature: 37
RATE: 20 resp/min
RATE: 24 resp/min
RATE: 24 resp/min
pCO2 arterial: 55 mmHg — ABNORMAL HIGH (ref 32.0–48.0)
pCO2 arterial: 43 mmHg (ref 32.0–48.0)
pCO2 arterial: 55 mmHg — ABNORMAL HIGH (ref 32.0–48.0)
pH, Arterial: 7.16 — CL (ref 7.350–7.450)
pH, Arterial: 7.23 — ABNORMAL LOW (ref 7.350–7.450)
pH, Arterial: 7.21 — ABNORMAL LOW (ref 7.350–7.450)
pO2, Arterial: 97 mmHg (ref 83.0–108.0)
pO2, Arterial: 117 mmHg — ABNORMAL HIGH (ref 83.0–108.0)
pO2, Arterial: 55 mmHg — ABNORMAL LOW (ref 83.0–108.0)

## 2019-03-10 LAB — PROCALCITONIN: Procalcitonin: >150 ng/mL

## 2019-03-10 LAB — HIV ANTIBODY (ROUTINE TESTING W REFLEX): HIV Screen 4th Generation wRfx: NONREACTIVE

## 2019-03-10 LAB — POTASSIUM: Potassium: 5.4 mmol/L — ABNORMAL HIGH (ref 3.5–5.1)

## 2019-03-10 LAB — MAGNESIUM: Magnesium: 2 mg/dL (ref 1.7–2.4)

## 2019-03-10 LAB — CK: Total CK: 2001 U/L — ABNORMAL HIGH (ref 49–397)

## 2019-03-10 LAB — PHOSPHORUS: Phosphorus: 4.7 mg/dL — ABNORMAL HIGH (ref 2.5–4.6)

## 2019-03-10 LAB — LACTIC ACID, PLASMA: Lactic Acid, Venous: 4.6 mmol/L (ref 0.5–1.9)

## 2019-03-10 LAB — TRIGLYCERIDES: Triglycerides: 120 mg/dL (ref <150)

## 2019-03-10 MED ORDER — VANCOMYCIN HCL 10 G IV SOLR
1500.0000 mg | Freq: Once | INTRAVENOUS | Status: DC
  Filled 2019-03-10: qty 1500

## 2019-03-10 MED ORDER — VECURONIUM BROMIDE 10 MG IV SOLR
10.0000 mg | Freq: Once | INTRAVENOUS | Status: AC
  Administered 2019-03-10: 10 mg via INTRAVENOUS

## 2019-03-10 MED ORDER — SODIUM BICARBONATE 8.4 % IV SOLN
50.0000 meq | Freq: Once | INTRAVENOUS | Status: AC
  Administered 2019-03-11: 50 meq via INTRAVENOUS
  Filled 2019-03-10: qty 50

## 2019-03-10 MED ORDER — STERILE WATER FOR INJECTION IJ SOLN
INTRAMUSCULAR | Status: AC
  Administered 2019-03-10: 17:00:00
  Filled 2019-03-10: qty 10

## 2019-03-10 MED ORDER — VECURONIUM BROMIDE 10 MG IV SOLR
INTRAVENOUS | Status: AC
  Administered 2019-03-10: 10 mg via INTRAVENOUS
  Filled 2019-03-10: qty 10

## 2019-03-10 MED ORDER — PUREFLOW DIALYSIS SOLUTION
INTRAVENOUS | Status: DC
  Administered 2019-03-10: 13:00:00 via INTRAVENOUS_CENTRAL

## 2019-03-10 MED ORDER — LORAZEPAM 2 MG/ML IJ SOLN
INTRAMUSCULAR | Status: AC
  Administered 2019-03-10: 17:00:00 2 mg via INTRAVENOUS
  Filled 2019-03-10: qty 1

## 2019-03-10 MED ORDER — FAT EMULSION PLANT BASED 20 % IV EMUL
250.0000 mL | INTRAVENOUS | Status: AC
  Administered 2019-03-10: 250 mL via INTRAVENOUS
  Filled 2019-03-10: qty 250

## 2019-03-10 MED ORDER — CALCIUM GLUCONATE-NACL 1-0.675 GM/50ML-% IV SOLN
1.0000 g | Freq: Once | INTRAVENOUS | Status: AC
  Administered 2019-03-10: 1000 mg via INTRAVENOUS
  Filled 2019-03-10: qty 50

## 2019-03-10 MED ORDER — LACTATED RINGERS IV BOLUS
1000.0000 mL | Freq: Once | INTRAVENOUS | Status: AC
  Administered 2019-03-10: 11:00:00 1000 mL via INTRAVENOUS

## 2019-03-10 MED ORDER — FUROSEMIDE 10 MG/ML IJ SOLN
80.0000 mg | Freq: Once | INTRAMUSCULAR | Status: AC
  Administered 2019-03-10: 80 mg via INTRAVENOUS
  Filled 2019-03-10: qty 8

## 2019-03-10 MED ORDER — HEPARIN SODIUM (PORCINE) 5000 UNIT/ML IJ SOLN
5000.0000 [IU] | Freq: Three times a day (TID) | INTRAMUSCULAR | Status: DC
  Administered 2019-03-11 – 2019-03-13 (×7): 5000 [IU] via SUBCUTANEOUS
  Filled 2019-03-10 (×7): qty 1

## 2019-03-10 MED ORDER — CHLORHEXIDINE GLUCONATE 0.12 % MT SOLN: 15.0000 mL | Freq: Two times a day (BID) | OROMUCOSAL | Status: DC

## 2019-03-10 MED ORDER — VANCOMYCIN HCL 1.5 G IV SOLR
1500.0000 mg | Freq: Once | INTRAVENOUS | Status: AC
  Administered 2019-03-10: 1500 mg via INTRAVENOUS
  Filled 2019-03-10: qty 1500

## 2019-03-10 MED ORDER — DEXTROSE 50 % IV SOLN
1.0000 | Freq: Once | INTRAVENOUS | Status: AC
  Administered 2019-03-11: 50 mL via INTRAVENOUS
  Filled 2019-03-10: qty 50

## 2019-03-10 MED ORDER — LACTATED RINGERS IV BOLUS
1000.0000 mL | Freq: Once | INTRAVENOUS | Status: AC
  Administered 2019-03-10: 15:00:00 1000 mL via INTRAVENOUS

## 2019-03-10 MED ORDER — INSULIN REGULAR HUMAN 100 UNIT/ML IJ SOLN
10.0000 [IU] | Freq: Once | INTRAMUSCULAR | Status: AC
  Administered 2019-03-11: 10 [IU] via INTRAVENOUS
  Filled 2019-03-10: qty 10

## 2019-03-10 MED ORDER — ALTEPLASE 2 MG IJ SOLR
3.0000 mg | Freq: Once | INTRAMUSCULAR | Status: AC
  Administered 2019-03-10: 3 mg

## 2019-03-10 MED ORDER — CALCIUM GLUCONATE-NACL 2-0.675 GM/100ML-% IV SOLN
2.0000 g | Freq: Once | INTRAVENOUS | Status: AC
  Administered 2019-03-10: 2000 mg via INTRAVENOUS
  Filled 2019-03-10: qty 100

## 2019-03-10 MED ORDER — HYDROCORTISONE NA SUCCINATE PF 100 MG IJ SOLR
50.0000 mg | Freq: Four times a day (QID) | INTRAMUSCULAR | Status: DC
  Administered 2019-03-10 – 2019-03-11 (×5): 50 mg via INTRAVENOUS
  Filled 2019-03-10 (×5): qty 2

## 2019-03-10 MED ORDER — VECURONIUM BROMIDE 10 MG IV SOLR
INTRAVENOUS | Status: AC
  Administered 2019-03-10: 04:00:00 10 mg via INTRAVENOUS
  Filled 2019-03-10: qty 10

## 2019-03-10 MED ORDER — DEXTROSE 50 % IV SOLN
14.0000 mL | Freq: Once | INTRAVENOUS | Status: AC
  Administered 2019-03-10: 14 mL via INTRAVENOUS

## 2019-03-10 MED ORDER — LORAZEPAM 2 MG/ML IJ SOLN
2.0000 mg | Freq: Once | INTRAMUSCULAR | Status: AC
  Administered 2019-03-10: 2 mg via INTRAVENOUS

## 2019-03-10 MED ORDER — HEPARIN SODIUM (PORCINE) 5000 UNIT/ML IJ SOLN: 5000.0000 [IU] | Freq: Three times a day (TID) | INTRAMUSCULAR | Status: DC

## 2019-03-10 MED ORDER — PUREFLOW DIALYSIS SOLUTION
INTRAVENOUS | Status: DC
  Administered 2019-03-10: 20:00:00 via INTRAVENOUS_CENTRAL
  Administered 2019-03-11 (×2): 3 via INTRAVENOUS_CENTRAL
  Administered 2019-03-11: 02:00:00 via INTRAVENOUS_CENTRAL

## 2019-03-10 MED ORDER — INSULIN ASPART 100 UNIT/ML ~~LOC~~ SOLN
2.0000 [IU] | SUBCUTANEOUS | Status: DC
  Administered 2019-03-11 (×2): 2 [IU] via SUBCUTANEOUS
  Filled 2019-03-10 (×2): qty 1

## 2019-03-10 MED ORDER — SODIUM BICARBONATE 8.4 % IV SOLN
50.0000 meq | Freq: Once | INTRAVENOUS | Status: AC
  Administered 2019-03-10: 50 meq via INTRAVENOUS
  Filled 2019-03-10: qty 50

## 2019-03-10 NOTE — Progress Notes (Signed)
[facility], [place] 03/10/19  Subjective:   Patient remains critically ill Continued on CRRT Requiring multiple pressors, sedation Potasium elevated Fio2 80% Glucagon, insulin infusions   Objective:  Vital signs in last 24 hours:  Temp:  [98.9 F (37.2 C)-100.6 F (38.1 C)] 100.4 F (38 C) (06/02 1000) Pulse Rate:  [71-207] 114 (06/02 1000) Resp:  [7-29] 22 (06/02 1000) BP: (84-121)/(39-71) 121/51 (06/02 1000) SpO2:  [89 %-100 %] 96 % (06/02 1000) Arterial Line BP: (78-110)/(36-49) 110/49 (06/02 1000) FiO2 (%):  [40 %-100 %] 80 % (06/02 1046) Weight:  [105.9 kg] 105.9 kg (06/02 0319)  Weight change: 15.2 kg Filed Weights   03/08/19 2319 03/10/19 0319  Weight: 90.7 kg 105.9 kg    Intake/Output:    Intake/Output Summary (Last 24 hours) at 03/10/2019 1143 Last data filed at 03/10/2019 1000 Gross per 24 hour  Intake 8114.44 ml  Output 1507 ml  Net 6607.44 ml   General :        critically ill Head:              Normocephalic, without obvious abnormality, atraumatic Eyes/ENT:      ETT, OGT in place Neck:              Supple,  thyroid: not enlarged, no JVD Lungs:            Clear, Vent assisted Heart:             Tachycardic, no rub or gallop Abdomen:      Soft, non-tender, bowel sounds active  Extremities:   no cyanosis or edema Skin:               Skin color, texture, turgor normal, no rashes or lesions Neurologic:    sedated  Basic Metabolic Panel:  Recent Labs  Lab 03/08/19 2333  03/09/19 0457 03/09/19 0819 03/09/19 1340 03/09/19 2103 03/10/19 0122 03/10/19 0431 03/10/19 0719  NA 140  --  135 134* 132* 132* 136  --  138  K 3.2*  --  2.7* 3.2* 4.8 5.2* 4.3  --  4.3  CL 108  --  106 106 101 102 104  --  108  CO2 21*  --  17* 16* 11* 17* 19*  --  21*  GLUCOSE 136*  --  271* 288* 443* 543* 411*  --  223*  BUN 9  --  '11 13 17 '$ 23* 24*  --  27*  CREATININE 1.26*  --  2.09* 2.47* 3.38* 4.16* 4.09*  --  4.62*  CALCIUM 9.1  --  8.5*  8.4* 8.2* 6.9* 7.2*  --  7.1*  MG 2.0  --  1.8 1.6*  --  2.3  --  2.0  --   PHOS  --    < > 1.5* <1.0* 8.0* 5.8*  5.9* 5.1* 4.7* 4.2   < > = values in this interval not displayed.     CBC: Recent Labs  Lab 03/08/19 2333 03/09/19 0457 03/10/19 0431  WBC 14.8* 32.7* 30.4*  NEUTROABS 7.6  --  23.9*  HGB 14.3 12.6* 11.4*  HCT 41.8 37.9* 33.1*  MCV 78.9* 82.2 79.0*  PLT 406* 393 240     No results found for: HEPBSAG, HEPBSAB, HEPBIGM    Microbiology:  Recent Results (from the past 240 hour(s))  SARS Coronavirus 2 (CEPHEID - Performed in [place] hospital lab), Hosp Order     Status: None   Collection Time: 03/08/19 11:48 PM  Result Value Ref Range Status   SARS Coronavirus 2 NEGATIVE NEGATIVE Final    Comment: (NOTE) If result is NEGATIVE SARS-CoV-2 target nucleic acids are NOT DETECTED. The SARS-CoV-2 RNA is generally detectable in upper and lower  respiratory specimens during the acute phase of infection. The lowest  concentration of SARS-CoV-2 viral copies this assay can detect is 250  copies / mL. A negative result does not preclude SARS-CoV-2 infection  and should not be used as the sole basis for treatment or other  patient management decisions.  A negative result may occur with  improper specimen collection / handling, submission of specimen other  than nasopharyngeal swab, presence of viral mutation(s) within the  areas targeted by this assay, and inadequate number of viral copies  (<250 copies / mL). A negative result must be combined with clinical  observations, patient history, and epidemiological information. If result is POSITIVE SARS-CoV-2 target nucleic acids are DETECTED. The SARS-CoV-2 RNA is generally detectable in upper and lower  respiratory specimens dur ing the acute phase of infection.  Positive  results are indicative of active infection with SARS-CoV-2.  Clinical  correlation with patient history and other diagnostic information is   necessary to determine patient infection status.  Positive results do  not rule out bacterial infection or co-infection with other viruses. If result is PRESUMPTIVE POSTIVE SARS-CoV-2 nucleic acids MAY BE PRESENT.   A presumptive positive result was obtained on the submitted specimen  and confirmed on repeat testing.  While 2019 novel coronavirus  (SARS-CoV-2) nucleic acids may be present in the submitted sample  additional confirmatory testing may be necessary for epidemiological  and / or clinical management purposes  to differentiate between  SARS-CoV-2 and other Sarbecovirus currently known to infect humans.  If clinically indicated additional testing with an alternate test  methodology [phone]) is advised. The SARS-CoV-2 RNA is generally  detectable in upper and lower respiratory sp ecimens during the acute  phase of infection. The expected result is Negative. Fact Sheet for Patients:  StrictlyIdeas.no Fact Sheet for Healthcare Providers: BankingDealers.co.za This test is not yet approved or cleared by the [place] FDA and has been authorized for detection and/or diagnosis of SARS-CoV-2 by FDA under an Emergency Use Authorization (EUA).  This EUA will remain in effect (meaning this test can be used) for the duration of the COVID-19 declaration under Section 564(b)(1) of the Act, 21 U.S.C. section 360bbb-3(b)(1), unless the authorization is terminated or revoked sooner. Performed at [facility], [address] [place], [place] [address]   MRSA PCR Screening     Status: None   Collection Time: 03/09/19  2:48 AM  Result Value Ref Range Status   MRSA by PCR NEGATIVE NEGATIVE Final    Comment:        The GeneXpert MRSA Assay (FDA approved for NASAL specimens only), is one component of a comprehensive MRSA colonization surveillance program. It is not intended to diagnose MRSA infection nor to guide or monitor  treatment for MRSA infections. Performed at [facility], [address] [place], [place] [address]   Culture, respiratory (non-expectorated)     Status: None (Preliminary result)   Collection Time: 03/09/19  8:00 AM  Result Value Ref Range Status   Specimen Description   Final    TRACHEAL ASPIRATE Performed at [facility], [address], [place], [place] [address]    Special Requests   Final    NONE Performed at [facility], [address] [place], [place] [address]    Gram Stain   Final    ABUNDANT WBC PRESENT,BOTH PMN AND MONONUCLEAR MODERATE GRAM POSITIVE COCCI RARE YEAST    Culture   Final    CULTURE REINCUBATED FOR BETTER GROWTH Performed at [facility], [place] [address] [place], [place] [address]    Report Status PENDING  Incomplete  CULTURE, BLOOD (ROUTINE X 2) w Reflex to ID Panel     Status: None (Preliminary result)   Collection Time: 03/09/19  2:53 PM  Result Value Ref Range Status   Specimen Description BLOOD BLOOD RIGHT ARM  Final   Special Requests   Final    BOTTLES DRAWN AEROBIC AND ANAEROBIC Blood Culture results may not be optimal due to an excessive volume of blood received in culture bottles   Culture  Setup Time PENDING  Incomplete   Culture   Final    NO GROWTH < 24 HOURS Performed at [facility], [address], [place], [place] [address]    Report Status PENDING  Incomplete  CULTURE, BLOOD (ROUTINE X 2) w Reflex to ID Panel     Status: None (Preliminary result)   Collection Time: 03/09/19  5:00 PM  Result Value Ref Range Status   Specimen Description BLOOD RIGHT ARM  Final   Special Requests   Final    BOTTLES DRAWN AEROBIC AND ANAEROBIC Blood Culture results may not be optimal due to an excessive volume of blood received in culture bottles   Culture  Setup Time PENDING  Incomplete   Culture   Final    NO GROWTH < 24 HOURS Performed at [facility], [address] [place], [place]  [address]    Report Status PENDING  Incomplete    Coagulation Studies: Recent Labs    03/08/19 December 16, 2331  LABPROT 12.0  INR 0.9    Urinalysis: Recent Labs    03/08/19 2326  COLORURINE YELLOW*  LABSPEC 1.021  PHURINE 5.0  GLUCOSEU NEGATIVE  HGBUR NEGATIVE  BILIRUBINUR NEGATIVE  KETONESUR NEGATIVE  PROTEINUR 30*  NITRITE NEGATIVE  LEUKOCYTESUR NEGATIVE      Imaging: Dg Abd 1 View  Result Date: 03/09/2019 CLINICAL DATA:  28 year old male presenting after drug overdose. EXAM: ABDOMEN - 1 VIEW COMPARISON:  Portable chest today. CT Abdomen and Pelvis 05/02/2007. FINDINGS: Semi upright AP view at 0510 hours. Enteric tube side hole is at the level of the gastric body. Nonobstructed bowel-gas pattern. No osseous abnormality identified. IMPRESSION: Enteric tube side hole at the level of the gastric body. Normal visible bowel gas pattern. Electronically Signed   By: Genevie Ann M.D.   On: 03/09/2019 05:46   Portable Chest Xray  Result Date: 03/10/2019 CLINICAL DATA:  Respiratory failure EXAM: PORTABLE CHEST 1 VIEW COMPARISON:  03/09/2019 FINDINGS: Endotracheal tube tip at the clavicular heads. The orogastric tube at least reaches the stomach. Left IJ dialysis catheter tip at the upper cavoatrial junction. Worsening aeration with obscured left diaphragm. There is diffuse hazy lung opacity. Suspect layering pleural effusions. Cardiomegaly and vascular pedicle widening. IMPRESSION: Stable, unremarkable hardware positioning. Worsening aeration, likely atelectasis, layering fluid, and edema. Electronically Signed   By: Monte Fantasia M.D.   On: 03/10/2019 06:22   Dg Chest Port 1 View  Result Date: 03/09/2019 CLINICAL DATA:  Central line placement EXAM: PORTABLE CHEST 1 VIEW COMPARISON:  03/09/2019 FINDINGS: The endotracheal tube terminates above the carina. The newly placed left-sided central venous catheter tip projects over the SVC/distal left brachiocephalic  vein. The enteric tube extends below the left  hemidiaphragm. There is no left-sided pneumothorax. The heart size is stable. No right-sided pneumothorax. Coarsened lung markings are again identified bilaterally. IMPRESSION: 1. Newly placed left-sided central venous catheter as above with no evidence of a pneumothorax. 2. Remaining lines and tubes as above. 3. Stable cardiac size. Persistent coarse airspace opacities bilaterally similar to prior study. Electronically Signed   By: Constance Holster M.D.   On: 03/09/2019 19:58   Dg Chest Port 1 View  Result Date: 03/09/2019 CLINICAL DATA:  27 year old male presenting after drug overdose. EXAM: PORTABLE CHEST 1 VIEW COMPARISON:  02/09/2019 and earlier. FINDINGS: Portable AP semi upright view at 0510 hours. Intubated, endotracheal tube tip at the level the clavicles. Enteric tube courses to the abdomen, tip not included. Normal cardiac size and mediastinal contours. Patchy and confluent perihilar and lung base opacity. No superimposed pneumothorax. No overt edema. No pleural effusion identified. Paucity of bowel gas in the upper abdomen. No osseous abnormality identified. IMPRESSION: 1. Endotracheal tube tip at the level the clavicles. Enteric tube courses to the abdomen, tip not included. 2. Patchy and confluent perihilar and lung base opacity. Consider aspiration. No pulmonary edema or pleural effusion. Electronically Signed   By: Genevie Ann M.D.   On: 03/09/2019 05:45     Medications:   . calcium gluconate    . epinephrine 20 mcg/min (03/10/19 0936)  . Fat emulsion    . fentaNYL infusion INTRAVENOUS 200 mcg/hr (03/10/19 0909)  . glucagon (GLUCAGEN) infusion (for beta blocker/calcium channel blocker overdose) 1 mg/hr (03/10/19 0909)  . insulin 15.5 mL/hr at 03/10/19 0909  . norepinephrine (LEVOPHED) Adult infusion 110 mcg/min (03/10/19 1023)  . phenylephrine (NEO-SYNEPHRINE) Adult infusion 400 mcg/min (03/10/19 1115)  . piperacillin-tazobactam (ZOSYN)  IV 12.5 mL/hr at 03/10/19 0909  . pureflow  1,500 mL/hr at 03/09/19 1530  . vancomycin 1,500 mg (03/10/19 1142)  . vasopressin (PITRESSIN) infusion - *FOR SHOCK* 0.06 Units/min (03/10/19 0909)   . chlorhexidine gluconate (MEDLINE KIT)  15 mL Mouth Rinse BID  . Chlorhexidine Gluconate Cloth  6 each Topical Q0600  . [START ON 03/11/2019] heparin injection (subcutaneous)  5,000 Units Subcutaneous Q8H  . hydrocortisone sod succinate (SOLU-CORTEF) inj  50 mg Intravenous Q6H  . ipratropium-albuterol  3 mL Nebulization Q6H  . mouth rinse  15 mL Mouth Rinse 10 times per day  . pantoprazole (PROTONIX) IV  40 mg Intravenous Q12H  . sodium chloride flush  10-40 mL Intracatheter Q12H   bisacodyl, fentaNYL, heparin, midazolam, sennosides, sodium chloride flush  Assessment/ Plan:  28 y.o. male with HTN , was admitted on 03/08/2019 with ARF, acute resp failure secondary to drug overdose  1.  Oliguric acute renal failure with hyperkalemia Minimal urine output Serum creatinine has rapidly increased from 1.26 at the time of admission to 3.38 Patient is critically ill, severely hypotensive on multiple pressors CRRT started 6/1 Recommend to continue CRRT for metabolic optimization and possibility of removing some amount of overdosed medications Change to 2 K bath Zero UF at present  2.  Acute respiratory failure Ventilator assisted at present  3. Metabolic acidosis -Expected to improve with hemodialysis support   LOS: [place] 6/2/202011:43 AM  [place], [place]  Note: This note was prepared with Dragon dictation. Any transcription errors are unintentional

## 2019-03-10 NOTE — Progress Notes (Signed)
High pressures noted on CRRT. CRRT stopped and new setup started again high pressures noted on CRRT and not able to keep pressures down in order to keep going with dialysis. Cathflo instilled in both lines to dwell for 2 hours. Will continue to monitor patient.

## 2019-03-10 NOTE — Progress Notes (Signed)
CRITICAL CARE NOTE  CC  follow up respiratory failure  SUBJECTIVE Patient remains critically ill Prognosis is guarded On multiple vasopressors On CRRT High vent support   BP (!) 105/43   Pulse (!) 122   Temp 99.2 F (37.3 C) (Axillary)   Resp (!) 24   Ht 5' 7.01" (1.702 m)   Wt 105.9 kg   SpO2 96%   BMI 36.56 kg/m    I/O last 3 completed shifts: In: 11566.9 [I.V.:7337.6; IV Piggyback:4229.3] Out: 1485 [Urine:85; Emesis/NG output:1400] Total I/O In: 973.7 [I.V.:911.5; IV Piggyback:62.2] Out: 16 [Urine:16]  SpO2: 96 % FiO2 (%): 100 %   REVIEW OF SYSTEMS  PATIENT IS UNABLE TO PROVIDE COMPLETE REVIEW OF SYSTEMS DUE TO SEVERE CRITICAL ILLNESS   PHYSICAL EXAMINATION:  GENERAL:critically ill appearing, +resp distress HEAD: Normocephalic, atraumatic.  EYES: Pupils equal, round, reactive to light.  No scleral icterus.  MOUTH: Moist mucosal membrane. NECK: Supple. No thyromegaly. No nodules. No JVD.  PULMONARY: +rhonchi, +wheezing CARDIOVASCULAR: S1 and S2. Regular rate and rhythm. No murmurs, rubs, or gallops.  GASTROINTESTINAL: Soft, nontender, -distended. No masses. Positive bowel sounds. No hepatosplenomegaly.  MUSCULOSKELETAL: No swelling, clubbing, or edema.  NEUROLOGIC: obtunded, GCS<8 SKIN:intact,warm,dry  MEDICATIONS: I have reviewed all medications and confirmed regimen as documented   CULTURE RESULTS   Recent Results (from the past 240 hour(s))  SARS Coronavirus 2 (CEPHEID - Performed in [place] hospital lab), Hosp Order     Status: None   Collection Time: 03/08/19 11:48 PM  Result Value Ref Range Status   SARS Coronavirus 2 NEGATIVE NEGATIVE Final    Comment: (NOTE) If result is NEGATIVE SARS-CoV-2 target nucleic acids are NOT DETECTED. The SARS-CoV-2 RNA is generally detectable in upper and lower  respiratory specimens during the acute phase of infection. The lowest  concentration of SARS-CoV-2 viral copies this assay can detect is 250   copies / mL. A negative result does not preclude SARS-CoV-2 infection  and should not be used as the sole basis for treatment or other  patient management decisions.  A negative result may occur with  improper specimen collection / handling, submission of specimen other  than nasopharyngeal swab, presence of viral mutation(s) within the  areas targeted by this assay, and inadequate number of viral copies  (<250 copies / mL). A negative result must be combined with clinical  observations, patient history, and epidemiological information. If result is POSITIVE SARS-CoV-2 target nucleic acids are DETECTED. The SARS-CoV-2 RNA is generally detectable in upper and lower  respiratory specimens dur ing the acute phase of infection.  Positive  results are indicative of active infection with SARS-CoV-2.  Clinical  correlation with patient history and other diagnostic information is  necessary to determine patient infection status.  Positive results do  not rule out bacterial infection or co-infection with other viruses. If result is PRESUMPTIVE POSTIVE SARS-CoV-2 nucleic acids MAY BE PRESENT.   A presumptive positive result was obtained on the submitted specimen  and confirmed on repeat testing.  While 2019 novel coronavirus  (SARS-CoV-2) nucleic acids may be present in the submitted sample  additional confirmatory testing may be necessary for epidemiological  and / or clinical management purposes  to differentiate between  SARS-CoV-2 and other Sarbecovirus currently known to infect humans.  If clinically indicated additional testing with an alternate test  methodology [phone]) is advised. The SARS-CoV-2 RNA is generally  detectable in upper and lower respiratory sp ecimens during the acute  phase of infection. The expected result  is Negative. Fact Sheet for Patients:  StrictlyIdeas.no Fact Sheet for Healthcare  Providers: BankingDealers.co.za This test is not yet approved or cleared by the [place] FDA and has been authorized for detection and/or diagnosis of SARS-CoV-2 by FDA under an Emergency Use Authorization (EUA).  This EUA will remain in effect (meaning this test can be used) for the duration of the COVID-19 declaration under Section 564(b)(1) of the Act, 21 U.S.C. section 360bbb-3(b)(1), unless the authorization is terminated or revoked sooner. Performed at [facility], [address] [place], [place] [address]   MRSA PCR Screening     Status: None   Collection Time: 03/09/19  2:48 AM  Result Value Ref Range Status   MRSA by PCR NEGATIVE NEGATIVE Final    Comment:        The GeneXpert MRSA Assay (FDA approved for NASAL specimens only), is one component of a comprehensive MRSA colonization surveillance program. It is not intended to diagnose MRSA infection nor to guide or monitor treatment for MRSA infections. Performed at [facility], [address] [place], [place] [address]   Culture, respiratory (non-expectorated)     Status: None (Preliminary result)   Collection Time: 03/09/19  8:00 AM  Result Value Ref Range Status   Specimen Description   Final    TRACHEAL ASPIRATE Performed at [facility], [address] [place], [place] [address]    Special Requests   Final    NONE Performed at [facility], [address] [place], [place] [address]    Gram Stain   Final    ABUNDANT WBC PRESENT,BOTH PMN AND MONONUCLEAR MODERATE GRAM POSITIVE COCCI RARE YEAST Performed at [facility], [place] [address], [place],  [address]    Culture PENDING  Incomplete   Report Status PENDING  Incomplete          IMAGING    Portable Chest Xray  Result Date: 03/10/2019 CLINICAL DATA:  Respiratory failure EXAM: PORTABLE CHEST 1 VIEW COMPARISON:  03/09/2019 FINDINGS: Endotracheal tube tip at the  clavicular heads. The orogastric tube at least reaches the stomach. Left IJ dialysis catheter tip at the upper cavoatrial junction. Worsening aeration with obscured left diaphragm. There is diffuse hazy lung opacity. Suspect layering pleural effusions. Cardiomegaly and vascular pedicle widening. IMPRESSION: Stable, unremarkable hardware positioning. Worsening aeration, likely atelectasis, layering fluid, and edema. Electronically Signed   By: Monte Fantasia M.D.   On: 03/10/2019 06:22   Dg Chest Port 1 View  Result Date: 03/09/2019 CLINICAL DATA:  Central line placement EXAM: PORTABLE CHEST 1 VIEW COMPARISON:  03/09/2019 FINDINGS: The endotracheal tube terminates above the carina. The newly placed left-sided central venous catheter tip projects over the SVC/distal left brachiocephalic vein. The enteric tube extends below the left hemidiaphragm. There is no left-sided pneumothorax. The heart size is stable. No right-sided pneumothorax. Coarsened lung markings are again identified bilaterally. IMPRESSION: 1. Newly placed left-sided central venous catheter as above with no evidence of a pneumothorax. 2. Remaining lines and tubes as above. 3. Stable cardiac size. Persistent coarse airspace opacities bilaterally similar to prior study. Electronically Signed   By: Constance Holster M.D.   On: 03/09/2019 19:58       Indwelling Urinary Catheter continued, requirement due to   Reason to continue Indwelling Urinary Catheter strict Intake/Output monitoring for hemodynamic instability   Central Line/ continued, requirement due to  Reason to continue [place] of central venous pressure or other hemodynamic parameters and poor  IV access   Ventilator continued, requirement due to severe respiratory failure   Ventilator Sedation RASS 0 to -2      ASSESSMENT AND PLAN SYNOPSIS  28 year old white male admitted to the ICU for acute drug overdose and toxicity mostly from calcium channel blocker  in the setting of multiorgan failure with severe respiratory failure, distributive shock and renal failure with metabolic encephalopathy  Severe ACUTE Hypoxic and Hypercapnic Respiratory Failure -continue Full MV support -continue Bronchodilator Therapy -Wean Fio2 and PEEP as tolerated -Able to wean at this time   ACUTE KIDNEY INJURY/Renal Failure Continue CRRT -follow chem 7 -follow UO -continue Foley Catheter-assess need -Avoid nephrotoxic agents -Recheck creatinine    NEUROLOGY - intubated and sedated - minimal sedation to achieve a RASS goal: -1    SHOCK-SEPSIS/HYPOVOLUMIC -use vasopressors to keep MAP>65 -follow ABG and LA -follow up cultures -emperic ABX -consider stress dose steroids -aggressive IV fluid resuscitation  CARDIAC ICU monitoring  ID -continue IV abx as prescibed -follow up cultures  GI GI PROPHYLAXIS as indicated  NUTRITIONAL STATUS DIET-->TF's as tolerated Constipation protocol as indicated  ENDO - will use ICU hypoglycemic\Hyperglycemia protocol if indicated   ELECTROLYTES -follow labs as needed -replace as needed -pharmacy consultation and following   DVT/GI PRX ordered TRANSFUSIONS AS NEEDED MONITOR FSBS ASSESS the need for LABS as needed   Critical Care Time devoted to patient care services described in this note is 34 minutes.   Overall, patient is critically ill, prognosis is guarded.  Patient with Multiorgan failure and at high risk for cardiac arrest and death.    Corrin Parker, M.D.  [facility]  Medical Director [place] Director [facility] Cardio-Pulmonary Department

## 2019-03-10 NOTE — Progress Notes (Signed)
Per family request, chaplain provided prayer for [place]. Mother is bedside. She is emotion and deeply grieving. She reports having recently reconnected with Josh and found great hope in their relationship. She acknowledged the reality of his condition and was open to conversation regarding code status. (Chaplain reported this to bedside nurse and NP). Other family include a brother and uncle.     03/10/19 2300  Clinical Encounter Type  Visited With Patient and family together  Visit Type Initial;Spiritual support;Critical Care  Referral From Nurse;Family  Consult/Referral To Chaplain  Spiritual Encounters  Spiritual Needs Prayer;Emotional;Grief support  Stress Factors  Patient Stress Factors Major life changes  Family Stress Factors Family relationships;Loss of control;Major life changes;Loss

## 2019-03-10 NOTE — Progress Notes (Signed)
Pt had episode of desaturation earlier in mid's 80's, he was placed on 100% FiO2 and PEEP 15.  CXR obtained which actually showed improved aeration, no pneumothorax.  Pt was given 80 mg Lasix, and Ultrafiltration increased to 500 ml x3 hrs per Nephrology.  Pt also having issues with hyperkalemia, pt switched to [facility] on CRRT, now giving Calcium gluconate, bicarb, and IV insulin.  Nephrology is following along with management of hyperkalemia.  Called pt's mother and have asked her to come to bedside as pt is high risk for cardiac arrest.  Updated pt's mother at bedside of his critical status, multiorgan failure, ARDS and hypoxia, and hyperkalemia and AKI despite CRRT.  Have explained to her that given his multiple issues and multiple organ failure, he is high risk for cardiac arrest and his prognosis is guarded, but that we will continue to be aggressive with all supportive measures.  Questions answered.   Darel Hong, AGACNP-BC [facility] Pager: [phone] Cell: [phone]

## 2019-03-10 NOTE — Progress Notes (Signed)
Pharmacy Antibiotic Note  Glenn Rasmussen is a 28 y.o. male admitted on 03/08/2019 with aspiration pneumonia.  Pharmacy was consulted for Zosyn dosing. He is currently receiving CRRT and remains on ventilator support. Leucocytosis has improved slightly since yesterday. This is day number 2 of antibiotic therapy  Plan: Continue Zosyn 3.375g IV q8h (4 hour infusion).  Height: 5' 7.01" (170.2 cm) Weight: 233 lb 7.5 oz (105.9 kg) IBW/kg (Calculated) : 66.12  Temp (24hrs), Avg:99.4 F (37.4 C), Min:98.4 F (36.9 C), Max:100.6 F (38.1 C)  Recent Labs  Lab 03/08/19 2333 03/09/19 0457 03/09/19 0624 03/09/19 0819 03/09/19 1340 03/09/19 2103 03/10/19 0122 03/10/19 0431 03/10/19 0719  WBC 14.8* 32.7*  --   --   --   --   --  30.4*  --   CREATININE 1.26* 2.09*  --  2.47* 3.38* 4.16* 4.09*  --  4.62*  LATICACIDVEN  --   --  5.1*  --   --  7.0* 4.6*  --   --     Estimated Creatinine Clearance: 27.1 mL/min (A) (by C-G formula based on SCr of 4.62 mg/dL (H)).    Microbiology 6/2 Rcx mod GPC, rare yeast 6/1 RCx pending  Thank you for allowing pharmacy to be a part of this patient's care.  Vallery Sa, PharmD Clinical Pharmacist 03/10/2019 9:18 AM

## 2019-03-10 NOTE — Progress Notes (Signed)
Per Dr. Candiss Norse, increase UF to 500 for 3hrs. , then decrease UF back to 300

## 2019-03-10 NOTE — Progress Notes (Signed)
Patient remains critically ill and intubated on ventilator. Psychiatry will continue to follow and provide medication management and report patient more stabilized.  [name] Donny Pique

## 2019-03-10 NOTE — Consult Note (Signed)
Pharmacy Electrolyte Monitoring Consult:  Pharmacy consulted to assist in monitoring and replacing electrolytes in this      Labs:   Potassium (mmol/L)  Date Value  03/10/2019 4.3   Magnesium (mg/dL)  Date Value  03/10/2019 2.0   Phosphorus (mg/dL)  Date Value  03/10/2019 4.2   Calcium (mg/dL)  Date Value  03/10/2019 7.1 (L)   Albumin (g/dL)  Date Value  03/10/2019 2.9 (L)   Corrected Ca: 7.98 mg/dL  Assessment/Plan: 28 year old male with a medical history as indicated below who presented to the ED after taking 38 tablets of Phenergan, 19 tablets of losartan, 42 tablets of amlodipine, and 78 tablets of clonazepam and noted electrolyte abnormalities. He is currently on an insulin drip On 6/1 he  received the following electrolyte supplementation: --2 grams IV calcium gluconate --LR with 40 mEq/L at 75 mL/hr (currently running) --4 grams IV magnesium sulfate --40 mEq po KCl --70 mEq IV KCl --45 mmol potassium phosphate  Today's electrolytes are wnl. He remains on full ventilator support and is receiving CRRT. No electrolyte supplementation required for today

## 2019-03-10 NOTE — Progress Notes (Signed)
[place] [facility] [place] NAME: Glenn Rasmussen    MR#:  [id number]  DATE OF BIRTH:  October 16, 1990  SUBJECTIVE:  CHIEF COMPLAINT:   Chief Complaint  Patient presents with  . Drug Overdose   -Admitted with intentional overdose of multiple medications.  Intubated for airway protection and on pressors now - appears critically ill, on 4 pressors. On CRRT today  REVIEW OF SYSTEMS:  Review of Systems  Unable to perform ROS: Critical illness    DRUG ALLERGIES:  No Known Allergies  VITALS:  Blood pressure (!) 121/51, pulse (!) 114, temperature (!) 100.4 F (38 C), resp. rate (!) 22, height 5' 7.01" (1.702 m), weight 105.9 kg, SpO2 96 %.  PHYSICAL EXAMINATION:  Physical Exam   GENERAL:  28 y.o.-year-old critically ill-appearing patient lying in the bed with no acute distress.  EYES: Pupils equal, round, reactive to light and accommodation. No scleral icterus. Extraocular muscles intact.  HEENT: Head atraumatic, normocephalic. Oropharynx and nasopharynx clear.  NECK:  Supple, no jugular venous distention. No thyroid enlargement, no tenderness.  LUNGS: Normal breath sounds bilaterally, no wheezing, rales,rhonchi or crepitation. No use of accessory muscles of respiration.  Decreased bibasilar breath sounds CARDIOVASCULAR: S1, S2 normal. No murmurs, rubs, or gallops.  ABDOMEN: Soft, nontender, nondistended. Bowel sounds present. No organomegaly or mass.  EXTREMITIES: No pedal edema, cyanosis, or clubbing.  NEUROLOGIC: Patient is sedated.  Not responding.  PSYCHIATRIC: The patient is sedated SKIN: No obvious rash, lesion, or ulcer.    LABORATORY PANEL:   CBC Recent Labs  Lab 03/10/19 0431  WBC 30.4*  HGB 11.4*  HCT 33.1*  PLT 240   ------------------------------------------------------------------------------------------------------------------  Chemistries  Recent Labs  Lab 03/09/19 0457  03/10/19 0431 03/10/19 0719  NA 135   < >   --  138  K 2.7*   < >  --  4.3  CL 106   < >  --  108  CO2 17*   < >  --  21*  GLUCOSE 271*   < >  --  223*  BUN 11   < >  --  27*  CREATININE 2.09*   < >  --  4.62*  CALCIUM 8.5*   < >  --  7.1*  MG 1.8   < > 2.0  --   AST 57*  --   --   --   ALT 50*  --   --   --   ALKPHOS 60  --   --   --   BILITOT 1.1  --   --   --    < > = values in this interval not displayed.   ------------------------------------------------------------------------------------------------------------------  Cardiac Enzymes Recent Labs  Lab 03/08/19 2333  TROPONINI <0.03   ------------------------------------------------------------------------------------------------------------------  RADIOLOGY:  Dg Abd 1 View  Result Date: 03/09/2019 CLINICAL DATA:  28 year old male presenting after drug overdose. EXAM: ABDOMEN - 1 VIEW COMPARISON:  Portable chest today. CT Abdomen and Pelvis 05/02/2007. FINDINGS: Semi upright AP view at 0510 hours. Enteric tube side hole is at the level of the gastric body. Nonobstructed bowel-gas pattern. No osseous abnormality identified. IMPRESSION: Enteric tube side hole at the level of the gastric body. Normal visible bowel gas pattern. Electronically Signed   By: Genevie Ann M.D.   On: 03/09/2019 05:46   Portable Chest Xray  Result Date: 03/10/2019 CLINICAL DATA:  Respiratory failure EXAM: PORTABLE CHEST 1 VIEW COMPARISON:  03/09/2019 FINDINGS: Endotracheal tube tip  at the clavicular heads. The orogastric tube at least reaches the stomach. Left IJ dialysis catheter tip at the upper cavoatrial junction. Worsening aeration with obscured left diaphragm. There is diffuse hazy lung opacity. Suspect layering pleural effusions. Cardiomegaly and vascular pedicle widening. IMPRESSION: Stable, unremarkable hardware positioning. Worsening aeration, likely atelectasis, layering fluid, and edema. Electronically Signed   By: Monte Fantasia M.D.   On: 03/10/2019 06:22   Dg Chest Port 1 View  Result  Date: 03/09/2019 CLINICAL DATA:  Central line placement EXAM: PORTABLE CHEST 1 VIEW COMPARISON:  03/09/2019 FINDINGS: The endotracheal tube terminates above the carina. The newly placed left-sided central venous catheter tip projects over the SVC/distal left brachiocephalic vein. The enteric tube extends below the left hemidiaphragm. There is no left-sided pneumothorax. The heart size is stable. No right-sided pneumothorax. Coarsened lung markings are again identified bilaterally. IMPRESSION: 1. Newly placed left-sided central venous catheter as above with no evidence of a pneumothorax. 2. Remaining lines and tubes as above. 3. Stable cardiac size. Persistent coarse airspace opacities bilaterally similar to prior study. Electronically Signed   By: Constance Holster M.D.   On: 03/09/2019 19:58   Dg Chest Port 1 View  Result Date: 03/09/2019 CLINICAL DATA:  28 year old male presenting after drug overdose. EXAM: PORTABLE CHEST 1 VIEW COMPARISON:  02/09/2019 and earlier. FINDINGS: Portable AP semi upright view at 0510 hours. Intubated, endotracheal tube tip at the level the clavicles. Enteric tube courses to the abdomen, tip not included. Normal cardiac size and mediastinal contours. Patchy and confluent perihilar and lung base opacity. No superimposed pneumothorax. No overt edema. No pleural effusion identified. Paucity of bowel gas in the upper abdomen. No osseous abnormality identified. IMPRESSION: 1. Endotracheal tube tip at the level the clavicles. Enteric tube courses to the abdomen, tip not included. 2. Patchy and confluent perihilar and lung base opacity. Consider aspiration. No pulmonary edema or pleural effusion. Electronically Signed   By: Genevie Ann M.D.   On: 03/09/2019 05:45    EKG:   Orders placed or performed during the hospital encounter of 03/08/19  . ED EKG  . ED EKG  . ED EKG  . ED EKG  . EKG 12-Lead  . EKG 12-Lead  . EKG 12-Lead  . EKG 12-Lead  . EKG 12-Lead  . EKG 12-Lead  . EKG  12-Lead  . EKG 12-Lead  . EKG 12-Lead  . EKG 12-Lead  . EKG 12-Lead  . EKG 12-Lead    ASSESSMENT AND PLAN:   28 year old male with past medical history significant for hypertension, IBS admitted secondary to overdose on Phenergan, losartan, Norvasc and Klonopin  1.  Acute respiratory failure-intubated for airway protection secondary to acute metabolic encephalopathy -Patient is currently on ventilator, management per ICU team -Minimal sedation with fentanyl but neurologically-not  Responding   2.  Intentional drug overdose-patient overdosed on 38 tablets of Phenergan, 19 tablets of losartan, 42 tablets of Norvasc and 78 tablets of Klonopin as he has mentioned it to EMS.  He apparently texted his friends to say goodbye, will need psychiatry consult after extubation. -Urine tox positive for benzos and tricyclics -Poison control has been contacted, management per ICU team at this time.  Extremely hypotensive -Per their recommendations, he is on insulin drip because of intake of calcium channel blocker.  Also on Levophed and vasopressin for hypertension  3.  Acute renal failure-likely ATN from hypotension.  -Started on CRRT, metabolic acidosis.  Appreciate nephrology consult  4.  Hypokalemia-being replaced  5.  Septic shock-concern for possible aspiration.  WBC is significantly elevated -Procalcitonin is elevated.  On 4 pressors -Continue Zosyn.  Vancomycin has been added.  Cultures pending  6.  DVT prophylaxis-on subcutaneous heparin  Overall poor prognosis at this time    All the records are reviewed and case discussed with Care Management/Social Workerr. Management plans discussed with the patient, family and they are in agreement.  CODE STATUS: Full code  TOTAL TIME TAKING CARE OF THIS PATIENT: 39 minutes.   POSSIBLE D/C IN ? DAYS, DEPENDING ON CLINICAL CONDITION.   Gladstone Lighter M.D on 03/10/2019 at 11:03 AM  Between 7am to 6pm - Pager - [phone]  After 6pm go  to [email or web address] - password [facility]  Office  [phone]  CC: Primary care physician; Maeola Sarah, MD

## 2019-03-11 ENCOUNTER — Inpatient Hospital Stay: Payer: BC Managed Care – PPO

## 2019-03-11 DIAGNOSIS — T461X1A Poisoning by calcium-channel blockers, accidental (unintentional), initial encounter: Secondary | ICD-10-CM

## 2019-03-11 DIAGNOSIS — T1491XA Suicide attempt, initial encounter: Secondary | ICD-10-CM

## 2019-03-11 LAB — BLOOD GAS, ARTERIAL
Acid-base deficit: 4.7 mmol/L — ABNORMAL HIGH (ref 0.0–2.0)
Acid-base deficit: 4.7 mmol/L — ABNORMAL HIGH (ref 0.0–2.0)
Bicarbonate: 22.5 mmol/L (ref 20.0–28.0)
Bicarbonate: 22.5 mmol/L (ref 20.0–28.0)
FIO2: 1
FIO2: 1
MECHVT: 500 mL
Mechanical Rate: 24
O2 Saturation: 99.8 %
O2 Saturation: 99.8 %
PEEP: 15 cmH2O
Patient temperature: 37
Patient temperature: 37.5
pCO2 arterial: 49 mmHg — ABNORMAL HIGH (ref 32.0–48.0)
pCO2 arterial: 49 mmHg — ABNORMAL HIGH (ref 32.0–48.0)
pH, Arterial: 7.26 — ABNORMAL LOW (ref 7.350–7.450)
pH, Arterial: 7.26 — ABNORMAL LOW (ref 7.350–7.450)
pO2, Arterial: 235 mmHg — ABNORMAL HIGH (ref 83.0–108.0)
pO2, Arterial: 235 mmHg — ABNORMAL HIGH (ref 83.0–108.0)

## 2019-03-11 LAB — RENAL FUNCTION PANEL
Albumin: 2.9 g/dL — ABNORMAL LOW (ref 3.5–5.0)
Albumin: 2.9 g/dL — ABNORMAL LOW (ref 3.5–5.0)
Albumin: 2.9 g/dL — ABNORMAL LOW (ref 3.5–5.0)
Albumin: 2.9 g/dL — ABNORMAL LOW (ref 3.5–5.0)
Albumin: 3 g/dL — ABNORMAL LOW (ref 3.5–5.0)
Albumin: 3.1 g/dL — ABNORMAL LOW (ref 3.5–5.0)
Albumin: 3.1 g/dL — ABNORMAL LOW (ref 3.5–5.0)
Albumin: 3.1 g/dL — ABNORMAL LOW (ref 3.5–5.0)
Anion gap: 10 (ref 5–15)
Anion gap: 11 (ref 5–15)
Anion gap: 11 (ref 5–15)
Anion gap: 11 (ref 5–15)
Anion gap: 11 (ref 5–15)
Anion gap: 12 (ref 5–15)
Anion gap: 14 (ref 5–15)
Anion gap: 8 (ref 5–15)
BUN: 29 mg/dL — ABNORMAL HIGH (ref 6–20)
BUN: 30 mg/dL — ABNORMAL HIGH (ref 6–20)
BUN: 31 mg/dL — ABNORMAL HIGH (ref 6–20)
BUN: 32 mg/dL — ABNORMAL HIGH (ref 6–20)
BUN: 32 mg/dL — ABNORMAL HIGH (ref 6–20)
BUN: 33 mg/dL — ABNORMAL HIGH (ref 6–20)
BUN: 34 mg/dL — ABNORMAL HIGH (ref 6–20)
BUN: 37 mg/dL — ABNORMAL HIGH (ref 6–20)
CO2: 19 mmol/L — ABNORMAL LOW (ref 22–32)
CO2: 20 mmol/L — ABNORMAL LOW (ref 22–32)
CO2: 22 mmol/L (ref 22–32)
CO2: 22 mmol/L (ref 22–32)
CO2: 22 mmol/L (ref 22–32)
CO2: 23 mmol/L (ref 22–32)
CO2: 23 mmol/L (ref 22–32)
CO2: 23 mmol/L (ref 22–32)
Calcium: 7.5 mg/dL — ABNORMAL LOW (ref 8.9–10.3)
Calcium: 7.6 mg/dL — ABNORMAL LOW (ref 8.9–10.3)
Calcium: 7.7 mg/dL — ABNORMAL LOW (ref 8.9–10.3)
Calcium: 7.8 mg/dL — ABNORMAL LOW (ref 8.9–10.3)
Calcium: 7.9 mg/dL — ABNORMAL LOW (ref 8.9–10.3)
Calcium: 7.9 mg/dL — ABNORMAL LOW (ref 8.9–10.3)
Calcium: 8.1 mg/dL — ABNORMAL LOW (ref 8.9–10.3)
Calcium: 8.4 mg/dL — ABNORMAL LOW (ref 8.9–10.3)
Chloride: 103 mmol/L (ref 98–111)
Chloride: 103 mmol/L (ref 98–111)
Chloride: 104 mmol/L (ref 98–111)
Chloride: 105 mmol/L (ref 98–111)
Chloride: 105 mmol/L (ref 98–111)
Chloride: 105 mmol/L (ref 98–111)
Chloride: 106 mmol/L (ref 98–111)
Chloride: 107 mmol/L (ref 98–111)
Creatinine, Ser: 3.97 mg/dL — ABNORMAL HIGH (ref 0.61–1.24)
Creatinine, Ser: 4.3 mg/dL — ABNORMAL HIGH (ref 0.61–1.24)
Creatinine, Ser: 4.44 mg/dL — ABNORMAL HIGH (ref 0.61–1.24)
Creatinine, Ser: 4.52 mg/dL — ABNORMAL HIGH (ref 0.61–1.24)
Creatinine, Ser: 4.55 mg/dL — ABNORMAL HIGH (ref 0.61–1.24)
Creatinine, Ser: 4.58 mg/dL — ABNORMAL HIGH (ref 0.61–1.24)
Creatinine, Ser: 4.58 mg/dL — ABNORMAL HIGH (ref 0.61–1.24)
Creatinine, Ser: 4.79 mg/dL — ABNORMAL HIGH (ref 0.61–1.24)
GFR calc Af Amer: 18 mL/min — ABNORMAL LOW (ref >60)
GFR calc Af Amer: 19 mL/min — ABNORMAL LOW (ref >60)
GFR calc Af Amer: 19 mL/min — ABNORMAL LOW (ref >60)
GFR calc Af Amer: 19 mL/min — ABNORMAL LOW (ref >60)
GFR calc Af Amer: 19 mL/min — ABNORMAL LOW (ref >60)
GFR calc Af Amer: 19 mL/min — ABNORMAL LOW (ref >60)
GFR calc Af Amer: 20 mL/min — ABNORMAL LOW (ref >60)
GFR calc Af Amer: 22 mL/min — ABNORMAL LOW (ref >60)
GFR calc non Af Amer: 15 mL/min — ABNORMAL LOW (ref >60)
GFR calc non Af Amer: 16 mL/min — ABNORMAL LOW (ref >60)
GFR calc non Af Amer: 16 mL/min — ABNORMAL LOW (ref >60)
GFR calc non Af Amer: 16 mL/min — ABNORMAL LOW (ref >60)
GFR calc non Af Amer: 16 mL/min — ABNORMAL LOW (ref >60)
GFR calc non Af Amer: 17 mL/min — ABNORMAL LOW (ref >60)
GFR calc non Af Amer: 17 mL/min — ABNORMAL LOW (ref >60)
GFR calc non Af Amer: 19 mL/min — ABNORMAL LOW (ref >60)
Glucose, Bld: 105 mg/dL — ABNORMAL HIGH (ref 70–99)
Glucose, Bld: 114 mg/dL — ABNORMAL HIGH (ref 70–99)
Glucose, Bld: 130 mg/dL — ABNORMAL HIGH (ref 70–99)
Glucose, Bld: 138 mg/dL — ABNORMAL HIGH (ref 70–99)
Glucose, Bld: 145 mg/dL — ABNORMAL HIGH (ref 70–99)
Glucose, Bld: 146 mg/dL — ABNORMAL HIGH (ref 70–99)
Glucose, Bld: 163 mg/dL — ABNORMAL HIGH (ref 70–99)
Glucose, Bld: 177 mg/dL — ABNORMAL HIGH (ref 70–99)
Phosphorus: 4.4 mg/dL (ref 2.5–4.6)
Phosphorus: 5.2 mg/dL — ABNORMAL HIGH (ref 2.5–4.6)
Phosphorus: 5.4 mg/dL — ABNORMAL HIGH (ref 2.5–4.6)
Phosphorus: 5.6 mg/dL — ABNORMAL HIGH (ref 2.5–4.6)
Phosphorus: 5.9 mg/dL — ABNORMAL HIGH (ref 2.5–4.6)
Phosphorus: 6.3 mg/dL — ABNORMAL HIGH (ref 2.5–4.6)
Phosphorus: 6.7 mg/dL — ABNORMAL HIGH (ref 2.5–4.6)
Phosphorus: 7 mg/dL — ABNORMAL HIGH (ref 2.5–4.6)
Potassium: 3.7 mmol/L (ref 3.5–5.1)
Potassium: 4.1 mmol/L (ref 3.5–5.1)
Potassium: 4.9 mmol/L (ref 3.5–5.1)
Potassium: 5.1 mmol/L (ref 3.5–5.1)
Potassium: 5.5 mmol/L — ABNORMAL HIGH (ref 3.5–5.1)
Potassium: 5.6 mmol/L — ABNORMAL HIGH (ref 3.5–5.1)
Potassium: 5.9 mmol/L — ABNORMAL HIGH (ref 3.5–5.1)
Potassium: 6.7 mmol/L (ref 3.5–5.1)
Sodium: 136 mmol/L (ref 135–145)
Sodium: 137 mmol/L (ref 135–145)
Sodium: 137 mmol/L (ref 135–145)
Sodium: 137 mmol/L (ref 135–145)
Sodium: 138 mmol/L (ref 135–145)
Sodium: 138 mmol/L (ref 135–145)
Sodium: 138 mmol/L (ref 135–145)
Sodium: 139 mmol/L (ref 135–145)

## 2019-03-11 LAB — CULTURE, RESPIRATORY W GRAM STAIN

## 2019-03-11 LAB — CBC
HCT: 32.8 % — ABNORMAL LOW (ref 39.0–52.0)
Hemoglobin: 10.8 g/dL — ABNORMAL LOW (ref 13.0–17.0)
MCH: 27.1 pg (ref 26.0–34.0)
MCHC: 32.9 g/dL (ref 30.0–36.0)
MCV: 82.2 fL (ref 80.0–100.0)
Platelets: 124 10*3/uL — ABNORMAL LOW (ref 150–400)
RBC: 3.99 MIL/uL — ABNORMAL LOW (ref 4.22–5.81)
RDW: 13.2 % (ref 11.5–15.5)
WBC: 24.7 10*3/uL — ABNORMAL HIGH (ref 4.0–10.5)
nRBC: 0.1 % (ref 0.0–0.2)

## 2019-03-11 LAB — COMPREHENSIVE METABOLIC PANEL
ALT: 60 U/L — ABNORMAL HIGH (ref 0–44)
AST: 72 U/L — ABNORMAL HIGH (ref 15–41)
Albumin: 2.9 g/dL — ABNORMAL LOW (ref 3.5–5.0)
Alkaline Phosphatase: 54 U/L (ref 38–126)
Anion gap: 12 (ref 5–15)
BUN: 31 mg/dL — ABNORMAL HIGH (ref 6–20)
CO2: 23 mmol/L (ref 22–32)
Calcium: 8 mg/dL — ABNORMAL LOW (ref 8.9–10.3)
Chloride: 104 mmol/L (ref 98–111)
Creatinine, Ser: 4.53 mg/dL — ABNORMAL HIGH (ref 0.61–1.24)
GFR calc Af Amer: 19 mL/min — ABNORMAL LOW (ref >60)
GFR calc non Af Amer: 16 mL/min — ABNORMAL LOW (ref >60)
Glucose, Bld: 137 mg/dL — ABNORMAL HIGH (ref 70–99)
Potassium: 5.6 mmol/L — ABNORMAL HIGH (ref 3.5–5.1)
Sodium: 139 mmol/L (ref 135–145)
Total Bilirubin: 1 mg/dL (ref 0.3–1.2)
Total Protein: 5.7 g/dL — ABNORMAL LOW (ref 6.5–8.1)

## 2019-03-11 LAB — PROCALCITONIN: Procalcitonin: <0.1 ng/mL

## 2019-03-11 LAB — GLUCOSE, CAPILLARY
Glucose-Capillary: 130 mg/dL — ABNORMAL HIGH (ref 70–99)
Glucose-Capillary: 136 mg/dL — ABNORMAL HIGH (ref 70–99)
Glucose-Capillary: 146 mg/dL — ABNORMAL HIGH (ref 70–99)
Glucose-Capillary: 159 mg/dL — ABNORMAL HIGH (ref 70–99)
Glucose-Capillary: 136 mg/dL — ABNORMAL HIGH (ref 70–99)
Glucose-Capillary: 126 mg/dL — ABNORMAL HIGH (ref 70–99)
Glucose-Capillary: 110 mg/dL — ABNORMAL HIGH (ref 70–99)
Glucose-Capillary: 105 mg/dL — ABNORMAL HIGH (ref 70–99)
Glucose-Capillary: 86 mg/dL (ref 70–99)
Glucose-Capillary: 97 mg/dL (ref 70–99)
Glucose-Capillary: 98 mg/dL (ref 70–99)
Glucose-Capillary: 97 mg/dL (ref 70–99)
Glucose-Capillary: 84 mg/dL (ref 70–99)
Glucose-Capillary: 83 mg/dL (ref 70–99)

## 2019-03-11 LAB — BASIC METABOLIC PANEL
Anion gap: 9 (ref 5–15)
BUN: 34 mg/dL — ABNORMAL HIGH (ref 6–20)
CO2: 24 mmol/L (ref 22–32)
Calcium: 8.3 mg/dL — ABNORMAL LOW (ref 8.9–10.3)
Chloride: 103 mmol/L (ref 98–111)
Creatinine, Ser: 4.08 mg/dL — ABNORMAL HIGH (ref 0.61–1.24)
GFR calc Af Amer: 21 mL/min — ABNORMAL LOW (ref >60)
GFR calc non Af Amer: 18 mL/min — ABNORMAL LOW (ref >60)
Glucose, Bld: 96 mg/dL (ref 70–99)
Potassium: 3.6 mmol/L (ref 3.5–5.1)
Sodium: 136 mmol/L (ref 135–145)

## 2019-03-11 LAB — LACTATE DEHYDROGENASE: LDH: 300 U/L — ABNORMAL HIGH (ref 98–192)

## 2019-03-11 LAB — MAGNESIUM: Magnesium: 1.9 mg/dL (ref 1.7–2.4)

## 2019-03-11 MED ORDER — INSULIN REGULAR NEW PEDIATRIC IV INFUSION >5 KG - SIMPLE MED
0.1000 [IU]/kg/h | INTRAVENOUS | Status: DC
  Administered 2019-03-11 – 2019-03-12 (×3): 0.1 [IU]/kg/h via INTRAVENOUS
  Filled 2019-03-11 (×3): qty 100

## 2019-03-11 MED ORDER — CALCIUM GLUCONATE-NACL 2-0.675 GM/100ML-% IV SOLN
2.0000 g | Freq: Once | INTRAVENOUS | Status: AC
  Administered 2019-03-11: 2000 mg via INTRAVENOUS
  Filled 2019-03-11: qty 100

## 2019-03-11 MED ORDER — DEXTROSE 10 % IV SOLN
INTRAVENOUS | Status: DC
  Administered 2019-03-11 – 2019-03-12 (×3): via INTRAVENOUS

## 2019-03-11 MED ORDER — PUREFLOW DIALYSIS SOLUTION
INTRAVENOUS | Status: DC
  Administered 2019-03-11 – 2019-03-13 (×4): via INTRAVENOUS_CENTRAL

## 2019-03-11 MED ORDER — VANCOMYCIN HCL 10 G IV SOLR
1500.0000 mg | Freq: Once | INTRAVENOUS | Status: DC
  Filled 2019-03-11: qty 1500

## 2019-03-11 MED ORDER — HYDROCORTISONE NA SUCCINATE PF 100 MG IJ SOLR
50.0000 mg | Freq: Four times a day (QID) | INTRAMUSCULAR | Status: DC
  Administered 2019-03-11 – 2019-03-15 (×15): 50 mg via INTRAVENOUS
  Filled 2019-03-11 (×14): qty 2

## 2019-03-11 MED ORDER — SODIUM CHLORIDE 0.9 % IV SOLN
2.0000 g | INTRAVENOUS | Status: DC
  Administered 2019-03-11 – 2019-03-17 (×7): 2 g via INTRAVENOUS
  Filled 2019-03-11 (×3): qty 2
  Filled 2019-03-11: qty 20
  Filled 2019-03-11 (×2): qty 2
  Filled 2019-03-11: qty 20
  Filled 2019-03-11 (×2): qty 2

## 2019-03-11 MED ORDER — VANCOMYCIN HCL 1.5 G IV SOLR
1500.0000 mg | Freq: Once | INTRAVENOUS | Status: AC
  Administered 2019-03-11: 1500 mg via INTRAVENOUS
  Filled 2019-03-11: qty 1500

## 2019-03-11 MED ORDER — SODIUM BICARBONATE 8.4 % IV SOLN
INTRAVENOUS | Status: AC
  Administered 2019-03-11
  Filled 2019-03-11: qty 50

## 2019-03-11 NOTE — Consult Note (Signed)
Pharmacy Electrolyte Monitoring Consult:  Pharmacy consulted to assist in monitoring and replacing electrolytes in this      Labs:   Potassium (mmol/L)  Date Value  03/11/2019 5.5 (H)   Magnesium (mg/dL)  Date Value  03/11/2019 1.9   Phosphorus (mg/dL)  Date Value  03/11/2019 6.7 (H)   Calcium (mg/dL)  Date Value  03/11/2019 7.8 (L)   Albumin (g/dL)  Date Value  03/11/2019 2.9 (L)   Corrected Ca: 8.6 mg/dL  Assessment/Plan: 28 year old male with a medical history as indicated below who presented to the ED after taking 38 tablets of Phenergan, 19 tablets of losartan, 42 tablets of amlodipine, and 78 tablets of clonazepam and noted electrolyte abnormalities. He is currently on an insulin drip On 6/1 he  received the following electrolyte supplementation:  Patient currently receiving 0K CRRT bath. Patient re-initiated on insulin drip infusion at 0.1mg /kg/hr. MIVF of D10 started at 66mL/hr. Will titrate to maintain blood glucose of ~ 140.   Will order calcium gluconate 2g IV x 1 to maintain corrected calcium of ~ 10.   No further adjustments warranted at this time. Pharmacy will continue to follow along with scheduled renal function panels scheduled Q4hr.   Pharmacy will continue to monitor and adjust per consult.

## 2019-03-11 NOTE — Progress Notes (Signed)
Patient remains critically ill and intubated on ventilator. Psychiatry will continue to follow and provide medication management and psychotherapeutic support once patient more stabilized.  Lavella Hammock, MD

## 2019-03-11 NOTE — Progress Notes (Signed)
CRRT re-started at this time due to filter clotting off at the beginning of shift change.

## 2019-03-11 NOTE — Progress Notes (Signed)
After discussing pt's current status and guarded prognosis with pt's mother, she had a family discussion with pt's uncle, and they have decided to make pt DNR/DNI.  Will continue current aggressive measures.    Darel Hong, AGACNP-BC [facility] Pager: [phone] Cell: [phone]

## 2019-03-11 NOTE — Progress Notes (Signed)
[place] at [place] NAME: Juaquin Ludington    MR#:  [id number]  DATE OF BIRTH:  17-Aug-1991  SUBJECTIVE:  CHIEF COMPLAINT:   Chief Complaint  Patient presents with  . Drug Overdose   -Patient remains intubated, on fentanyl drip however alert and following some simple commands. -On 3 pressors. -Flash pulmonary edema episode last night resulting in 100% FiO2 requirements, increased ultrafiltration on CRRT with much improvement this morning  REVIEW OF SYSTEMS:  Review of Systems  Unable to perform ROS: Critical illness    DRUG ALLERGIES:  No Known Allergies  VITALS:  Blood pressure (!) 111/52, pulse 97, temperature 97.7 F (36.5 C), temperature source Esophageal, resp. rate (!) 5, height 5' 7.01" (1.702 m), weight 107.7 kg, SpO2 95 %.  PHYSICAL EXAMINATION:  Physical Exam   GENERAL:  28 y.o.-year-old critically ill-appearing patient lying in the bed with no acute distress.  EYES: Pupils equal, round, reactive to light and accommodation. No scleral icterus. Extraocular muscles intact.  HEENT: Head atraumatic, normocephalic. Oropharynx and nasopharynx clear.  NECK:  Supple, no jugular venous distention. No thyroid enlargement, no tenderness.  LUNGS: Normal breath sounds bilaterally, no wheezing, rales,rhonchi or crepitation. No use of accessory muscles of respiration.  Decreased bibasilar breath sounds CARDIOVASCULAR: S1, S2 normal. No murmurs, rubs, or gallops.  ABDOMEN: Soft, nontender, nondistended. Bowel sounds present. No organomegaly or mass.  EXTREMITIES: No pedal edema, cyanosis, or clubbing.  NEUROLOGIC: Patient is arousable and follows simple commands.  Nodding head appropriately to simple questions PSYCHIATRIC: The patient is sedated but arousable and follows simple commands SKIN: No obvious rash, lesion, or ulcer.    LABORATORY PANEL:   CBC Recent Labs  Lab 03/11/19 0453  WBC 24.7*  HGB 10.8*  HCT 32.8*  PLT 124*    ------------------------------------------------------------------------------------------------------------------  Chemistries  Recent Labs  Lab 03/11/19 0453  03/11/19 1154  NA 139   < > 136  K 5.6*   < > 4.9  CL 104   < > 103  CO2 23   < > 19*  GLUCOSE 137*   < > 177*  BUN 31*   < > 37*  CREATININE 4.53*   < > 4.52*  CALCIUM 8.0*   < > 7.7*  MG 1.9  --   --   AST 72*  --   --   ALT 60*  --   --   ALKPHOS 54  --   --   BILITOT 1.0  --   --    < > = values in this interval not displayed.   ------------------------------------------------------------------------------------------------------------------  Cardiac Enzymes Recent Labs  Lab 03/08/19 2333  TROPONINI <0.03   ------------------------------------------------------------------------------------------------------------------  RADIOLOGY:  Dg Chest Port 1 View  Result Date: 03/11/2019 CLINICAL DATA:  Acute respiratory failure EXAM: PORTABLE CHEST 1 VIEW COMPARISON:  03/10/2019 FINDINGS: Cardiac shadow is stable. Endotracheal tube, gastric catheter and left jugular central line are again seen and stable. Increasing vascular congestion is noted when compare with the prior exam with enlarging left pleural effusion. No focal confluent infiltrate is seen. IMPRESSION: Worsening vascular congestion with left-sided pleural effusion. Tubes and lines as described. Electronically Signed   By: Inez Catalina M.D.   On: 03/11/2019 07:07   Dg Chest Port 1 View  Result Date: 03/10/2019 CLINICAL DATA:  Hypoxia.  Patient on a ventilator. EXAM: PORTABLE CHEST 1 VIEW COMPARISON:  Earlier film, same date. FINDINGS: The endotracheal tube is in good position, 5 cm  above the carina. The NG tube is coursing down the esophagus and into the stomach. The left IJ central venous catheter is stable. The lungs demonstrate improved aeration compared to the earlier film. Less edema and atelectasis. No pneumothorax. IMPRESSION: 1. Support apparatus in good  position without complicating features. 2. Improved lung aeration since earlier chest film. Electronically Signed   By: Marijo Sanes M.D.   On: 03/10/2019 20:35   Portable Chest Xray  Result Date: 03/10/2019 CLINICAL DATA:  Respiratory failure EXAM: PORTABLE CHEST 1 VIEW COMPARISON:  03/09/2019 FINDINGS: Endotracheal tube tip at the clavicular heads. The orogastric tube at least reaches the stomach. Left IJ dialysis catheter tip at the upper cavoatrial junction. Worsening aeration with obscured left diaphragm. There is diffuse hazy lung opacity. Suspect layering pleural effusions. Cardiomegaly and vascular pedicle widening. IMPRESSION: Stable, unremarkable hardware positioning. Worsening aeration, likely atelectasis, layering fluid, and edema. Electronically Signed   By: Monte Fantasia M.D.   On: 03/10/2019 06:22   Dg Chest Port 1 View  Result Date: 03/09/2019 CLINICAL DATA:  Central line placement EXAM: PORTABLE CHEST 1 VIEW COMPARISON:  03/09/2019 FINDINGS: The endotracheal tube terminates above the carina. The newly placed left-sided central venous catheter tip projects over the SVC/distal left brachiocephalic vein. The enteric tube extends below the left hemidiaphragm. There is no left-sided pneumothorax. The heart size is stable. No right-sided pneumothorax. Coarsened lung markings are again identified bilaterally. IMPRESSION: 1. Newly placed left-sided central venous catheter as above with no evidence of a pneumothorax. 2. Remaining lines and tubes as above. 3. Stable cardiac size. Persistent coarse airspace opacities bilaterally similar to prior study. Electronically Signed   By: Constance Holster M.D.   On: 03/09/2019 19:58    EKG:   Orders placed or performed during the hospital encounter of 03/08/19  . ED EKG  . ED EKG  . ED EKG  . ED EKG  . EKG 12-Lead  . EKG 12-Lead  . EKG 12-Lead  . EKG 12-Lead  . EKG 12-Lead  . EKG 12-Lead  . EKG 12-Lead  . EKG 12-Lead  . EKG 12-Lead  . EKG  12-Lead  . EKG 12-Lead  . EKG 12-Lead    ASSESSMENT AND PLAN:   28 year old male with past medical history significant for hypertension, IBS admitted secondary to overdose on Phenergan, losartan, Norvasc and Klonopin  1.  Acute respiratory failure-intubated for airway protection secondary to acute metabolic encephalopathy -Patient is currently on ventilator, management per ICU team -Minimal sedation with fentanyl but arousable and following simple commands. -Flash pulmonary edema episode last night requiring 100% FiO2, improved after increasing rate and ultrafiltration.  FiO2 at 40% now.  2.  Intentional drug overdose-patient overdosed on 38 tablets of Phenergan, 19 tablets of losartan, 42 tablets of Norvasc and 78 tablets of Klonopin as he has mentioned it to EMS.  He apparently texted his friends to say goodbye, will need psychiatry consult after extubation. -Urine tox positive for benzos and tricyclics -Poison control has been contacted, management per ICU team at this time.  Extremely hypotensive -Per their recommendations, he is on insulin drip because of intake of calcium channel blocker.  Also on Levophed, Neo-Synephrine and vasopressin for severe hypotension  3.  Acute renal failure-likely ATN from hypotension.  -Started on CRRT, metabolic acidosis.  Appreciate nephrology consult  4.   Hyperkalemia-likely iatrogenic from multiple replacements.  Continue to monitor.  Improved  5.  Septic shock-concern for possible aspiration.  WBC is significantly elevated -Procalcitonin is elevated.  On 3 pressors -Continue Zosyn.    6.  DVT prophylaxis-on subcutaneous heparin  Overall poor prognosis at this time Patient is critically ill   All the records are reviewed and case discussed with Care Management/Social Workerr. Management plans discussed with the patient, family and they are in agreement.  CODE STATUS: Full code  TOTAL TIME TAKING CARE OF THIS PATIENT: 37 minutes.    POSSIBLE D/C IN ? DAYS, DEPENDING ON CLINICAL CONDITION.   Gladstone Lighter M.D on 03/11/2019 at 12:50 PM  Between 7am to 6pm - Pager - [phone]  After 6pm go to [email or web address] - password [facility]  Office  [phone]  CC: Primary care physician; Maeola Sarah, MD

## 2019-03-11 NOTE — Progress Notes (Signed)
Per Dr. Keturah Barre previous order, UF will be decreased to 100 mL/hr.

## 2019-03-11 NOTE — Progress Notes (Signed)
CRITICAL CARE NOTE  CC  follow up respiratory failure  SUBJECTIVE Patient remains critically ill Prognosis is guarded Multiple vasopressors On CRRT Severe hypoxia   BP (!) 100/49   Pulse 93   Temp 99.5 F (37.5 C)   Resp 15   Ht 5' 7.01" (1.702 m)   Wt 107.7 kg   SpO2 98%   BMI 37.18 kg/m    I/O last 3 completed shifts: In: 30092 [I.V.:6619.8; IV Piggyback:4227.2] Out: 3300 [Urine:211; Emesis/NG output:1700; Other:4018] No intake/output data recorded.  SpO2: 98 % FiO2 (%): (S) 80 %  Vent Mode: PRVC FiO2 (%):  [80 %-100 %] 80 % Set Rate:  [24 bmp] 24 bmp Vt Set:  [500 mL] 500 mL PEEP:  [5 cmH20-15 cmH20] 12 cmH20 Plateau Pressure:  [25 TMA26-33 cmH20] 25 cmH20   SIGNIFICANT EVENTS 5/31 admitted for severe shock and resp failure DRUG OD 6/1 started CRRT multiorgan failure, resp failure, vasopressors 6/2 severe hypoxia UF rates increased to 500 6/3 DNR, multiorgan failure  REVIEW OF SYSTEMS  PATIENT IS UNABLE TO PROVIDE COMPLETE REVIEW OF SYSTEMS DUE TO SEVERE CRITICAL ILLNESS   PHYSICAL EXAMINATION:  GENERAL:critically ill appearing, +resp distress HEAD: Normocephalic, atraumatic.  EYES: Pupils equal, round, reactive to light.  No scleral icterus.  MOUTH: Moist mucosal membrane. NECK: Supple. No thyromegaly. No nodules. No JVD.  PULMONARY: +rhonchi, +wheezing CARDIOVASCULAR: S1 and S2. Regular rate and rhythm. No murmurs, rubs, or gallops.  GASTROINTESTINAL: Soft, nontender, -distended. No masses. Positive bowel sounds. No hepatosplenomegaly.  MUSCULOSKELETAL: No swelling, clubbing, or edema.  NEUROLOGIC: obtunded, GCS<8 SKIN:intact,warm,dry  MEDICATIONS: I have reviewed all medications and confirmed regimen as documented   CULTURE RESULTS   Recent Results (from the past 240 hour(s))  SARS Coronavirus 2 (CEPHEID - Performed in [place] hospital lab), Hosp Order     Status: None   Collection Time: 03/08/19 11:48 PM  Result Value Ref Range  Status   SARS Coronavirus 2 NEGATIVE NEGATIVE Final    Comment: (NOTE) If result is NEGATIVE SARS-CoV-2 target nucleic acids are NOT DETECTED. The SARS-CoV-2 RNA is generally detectable in upper and lower  respiratory specimens during the acute phase of infection. The lowest  concentration of SARS-CoV-2 viral copies this assay can detect is 250  copies / mL. A negative result does not preclude SARS-CoV-2 infection  and should not be used as the sole basis for treatment or other  patient management decisions.  A negative result may occur with  improper specimen collection / handling, submission of specimen other  than nasopharyngeal swab, presence of viral mutation(s) within the  areas targeted by this assay, and inadequate number of viral copies  (<250 copies / mL). A negative result must be combined with clinical  observations, patient history, and epidemiological information. If result is POSITIVE SARS-CoV-2 target nucleic acids are DETECTED. The SARS-CoV-2 RNA is generally detectable in upper and lower  respiratory specimens dur ing the acute phase of infection.  Positive  results are indicative of active infection with SARS-CoV-2.  Clinical  correlation with patient history and other diagnostic information is  necessary to determine patient infection status.  Positive results do  not rule out bacterial infection or co-infection with other viruses. If result is PRESUMPTIVE POSTIVE SARS-CoV-2 nucleic acids MAY BE PRESENT.   A presumptive positive result was obtained on the submitted specimen  and confirmed on repeat testing.  While 2019 novel coronavirus  (SARS-CoV-2) nucleic acids may be present in the submitted sample  additional confirmatory testing  may be necessary for epidemiological  and / or clinical management purposes  to differentiate between  SARS-CoV-2 and other Sarbecovirus currently known to infect humans.  If clinically indicated additional testing with an alternate  test  methodology [phone]) is advised. The SARS-CoV-2 RNA is generally  detectable in upper and lower respiratory sp ecimens during the acute  phase of infection. The expected result is Negative. Fact Sheet for Patients:  StrictlyIdeas.no Fact Sheet for Healthcare Providers: BankingDealers.co.za This test is not yet approved or cleared by the [place] FDA and has been authorized for detection and/or diagnosis of SARS-CoV-2 by FDA under an Emergency Use Authorization (EUA).  This EUA will remain in effect (meaning this test can be used) for the duration of the COVID-19 declaration under Section 564(b)(1) of the Act, 21 U.S.C. section 360bbb-3(b)(1), unless the authorization is terminated or revoked sooner. Performed at [facility], [address] [place], [place] [address]   MRSA PCR Screening     Status: None   Collection Time: 03/09/19  2:48 AM  Result Value Ref Range Status   MRSA by PCR NEGATIVE NEGATIVE Final    Comment:        The GeneXpert MRSA Assay (FDA approved for NASAL specimens only), is one component of a comprehensive MRSA colonization surveillance program. It is not intended to diagnose MRSA infection nor to guide or monitor treatment for MRSA infections. Performed at [facility], [address] [place], [place] [address]   Culture, respiratory (non-expectorated)     Status: None (Preliminary result)   Collection Time: 03/09/19  8:00 AM  Result Value Ref Range Status   Specimen Description   Final    TRACHEAL ASPIRATE Performed at [facility], [address] [place], [place] [address]    Special Requests   Final    NONE Performed at [facility], [address] [place], [place] [address]    Gram Stain   Final    ABUNDANT WBC PRESENT,BOTH PMN AND MONONUCLEAR MODERATE GRAM POSITIVE COCCI RARE YEAST    Culture   Final    CULTURE REINCUBATED FOR BETTER  GROWTH Performed at [facility], [place] [address], [place], [place] [address]    Report Status PENDING  Incomplete  CULTURE, BLOOD (ROUTINE X 2) w Reflex to ID Panel     Status: None (Preliminary result)   Collection Time: 03/09/19  2:53 PM  Result Value Ref Range Status   Specimen Description BLOOD BLOOD RIGHT ARM  Final   Special Requests   Final    BOTTLES DRAWN AEROBIC AND ANAEROBIC Blood Culture results may not be optimal due to an excessive volume of blood received in culture bottles   Culture  Setup Time PENDING  Incomplete   Culture   Final    NO GROWTH < 24 HOURS Performed at [facility], [address], [place], [place] [address]    Report Status PENDING  Incomplete  CULTURE, BLOOD (ROUTINE X 2) w Reflex to ID Panel     Status: None (Preliminary result)   Collection Time: 03/09/19  5:00 PM  Result Value Ref Range Status   Specimen Description BLOOD RIGHT ARM  Final   Special Requests   Final    BOTTLES DRAWN AEROBIC AND ANAEROBIC Blood Culture results may not be optimal due to an excessive volume of blood received in culture bottles   Culture  Setup Time PENDING  Incomplete   Culture   Final  NO GROWTH < 24 HOURS Performed at [facility], [place], [place] [address]    Report Status PENDING  Incomplete          IMAGING    Dg Chest Port 1 View  Result Date: 03/11/2019 CLINICAL DATA:  Acute respiratory failure EXAM: PORTABLE CHEST 1 VIEW COMPARISON:  03/10/2019 FINDINGS: Cardiac shadow is stable. Endotracheal tube, gastric catheter and left jugular central line are again seen and stable. Increasing vascular congestion is noted when compare with the prior exam with enlarging left pleural effusion. No focal confluent infiltrate is seen. IMPRESSION: Worsening vascular congestion with left-sided pleural effusion. Tubes and lines as described. Electronically Signed   By: Inez Catalina M.D.   On: 03/11/2019 07:07   Dg Chest Port 1  View  Result Date: 03/10/2019 CLINICAL DATA:  Hypoxia.  Patient on a ventilator. EXAM: PORTABLE CHEST 1 VIEW COMPARISON:  Earlier film, same date. FINDINGS: The endotracheal tube is in good position, 5 cm above the carina. The NG tube is coursing down the esophagus and into the stomach. The left IJ central venous catheter is stable. The lungs demonstrate improved aeration compared to the earlier film. Less edema and atelectasis. No pneumothorax. IMPRESSION: 1. Support apparatus in good position without complicating features. 2. Improved lung aeration since earlier chest film. Electronically Signed   By: Marijo Sanes M.D.   On: 03/10/2019 20:35    CBC    Component Value Date/Time   WBC 24.7 (H) 03/11/2019 0453   RBC 3.99 (L) 03/11/2019 0453   HGB 10.8 (L) 03/11/2019 0453   HCT 32.8 (L) 03/11/2019 0453   PLT 124 (L) 03/11/2019 0453   MCV 82.2 03/11/2019 0453   MCH 27.1 03/11/2019 0453   MCHC 32.9 03/11/2019 0453   RDW 13.2 03/11/2019 0453   LYMPHSABS 2.5 03/10/2019 0431   MONOABS 3.0 (H) 03/10/2019 0431   EOSABS 0.0 03/10/2019 0431   BASOSABS 0.1 03/10/2019 0431   BMP Latest Ref Rng & Units 03/11/2019 03/11/2019 03/11/2019  Glucose 70 - 99 mg/dL 137(H) 145(H) 138(H)  BUN 6 - 20 mg/dL 31(H) 32(H) 31(H)  Creatinine 0.61 - 1.24 mg/dL 4.53(H) 4.58(H) 4.44(H)  Sodium 135 - 145 mmol/L 139 138 139  Potassium 3.5 - 5.1 mmol/L 5.6(H) 5.9(H) 5.6(H)  Chloride 98 - 111 mmol/L 104 104 105  CO2 22 - 32 mmol/L 23 23 23   Calcium 8.9 - 10.3 mg/dL 8.0(L) 7.9(L) 7.9(L)      Indwelling Urinary Catheter continued, requirement due to   Reason to continue Indwelling Urinary Catheter strict Intake/Output monitoring for hemodynamic instability   Central Line/ continued, requirement due to  Reason to continue Kathryn of central venous pressure or other hemodynamic parameters and poor IV access   Ventilator continued, requirement due to severe respiratory failure   Ventilator Sedation RASS 0  to -2      ASSESSMENT AND PLAN SYNOPSIS  28 year old white male admitted to the ICU for acute drug overdose and toxicity mostly from calcium channel blocker in the setting of multiorgan failure with severe hypoxic respiratory failure, distributive shock and renal failure with metabolic encephalopathy   Severe ACUTE Hypoxic and Hypercapnic Respiratory Failure -continue Full MV support -continue Bronchodilator Therapy -Wean Fio2 and PEEP as tolerated unable to wean at this time  ACUTE KIDNEY INJURY/Renal Failure -follow chem 7 -follow UO -continue Foley Catheter-assess need -Avoid nephrotoxic agents -Recheck creatinine  Continue CRRT   NEUROLOGY - intubated and sedated - minimal sedation to achieve  a RASS goal: -1   SHOCK-SEPSIS/HYPOVOLUMIC/DISTRIBUTIVE -use vasopressors to keep MAP>65 -on 3 pressors -follow ABG and LA -follow up cultures -emperic ABX -stress dose steroids   CARDIAC ICU monitoring  ID -continue IV abx as prescibed -follow up cultures  GI GI PROPHYLAXIS as indicated  NUTRITIONAL STATUS DIET-->on hold Constipation protocol as indicated  ENDO - will use ICU hypoglycemic\Hyperglycemia protocol if indicated   ELECTROLYTES -follow labs as needed -replace as needed -pharmacy consultation and following   DVT/GI PRX ordered TRANSFUSIONS AS NEEDED MONITOR FSBS ASSESS the need for LABS as needed   Critical Care Time devoted to patient care services described in this note is 45 minutes.   Overall, patient is critically ill, prognosis is guarded.  Patient with Multiorgan failure and at high risk for cardiac arrest and death.   Patient now DNR  Corrin Parker, M.D.  [facility]  Medical Director [place] Director [facility] Cardio-Pulmonary Department

## 2019-03-11 NOTE — Progress Notes (Signed)
CRRT machine clotted off at 22:30.  Cartridge was replaced and CRRT restarted at 23:43.

## 2019-03-11 NOTE — Progress Notes (Addendum)
Pharmacy Antibiotic Note  Glenn Rasmussen is a 28 y.o. male admitted on 03/08/2019 with aspiration pneumonia.  Pharmacy was consulted for Zosyn dosing. He is currently receiving CRRT and remains on ventilator support. Leucocytosis has improved slightly since yesterday. This is day number 3 of antibiotic therapy  Plan: Patient with Group B streptococcus growing in sputum. Will transition patient to ceftriaxone 2g IV Q24hr. Will discontinue Zosyn at this time and expect to not give any subsequent doses of vancomycin.   Patient received vancomycin 1500mg  IV x 1 on 6/2. Per conversation on AM ICU rounds will order additional vancomycin 1500mg  IV X 1.   Height: 5' 7.01" (170.2 cm) Weight: 237 lb 7 oz (107.7 kg) IBW/kg (Calculated) : 66.12  Temp (24hrs), Avg:98.2 F (36.8 C), Min:97 F (36.1 C), Max:99.9 F (37.7 C)  Recent Labs  Lab 03/08/19 2333 03/09/19 0457 03/09/19 0624  03/09/19 2103 03/10/19 0122 03/10/19 0431  03/10/19 2257  03/11/19 0115 03/11/19 0255 03/11/19 0451 03/11/19 0453 03/11/19 0751  WBC 14.8* 32.7*  --   --   --   --  30.4*  --  28.5*  --   --   --   --  24.7*  --   CREATININE 1.26* 2.09*  --    < > 4.16* 4.09*  --    < >  --    < > 4.55* 4.44* 4.58* 4.53* 4.79*  LATICACIDVEN  --   --  5.1*  --  7.0* 4.6*  --   --   --   --   --   --   --   --   --    < > = values in this interval not displayed.    Estimated Creatinine Clearance: 26.4 mL/min (A) (by C-G formula based on SCr of 4.79 mg/dL (H)).    Antimicrobials this admission: Zosyn 6/1 >> 6/3 Vancomycin 6/2 >> 6/3 Ceftriaxone 6/3 >>  Dose adjustments this admission: N/A  Microbiology results: 6/1 BCx: no growth x 2 6/3 UCx: no growth  6/1 Sputum: moderate group b strep  6/1 MRSA PCR: negative  5/31 COVID: negative    Thank you for allowing pharmacy to be a part of this patient's care.  MLS 03/11/2019 12:17 PM

## 2019-03-11 NOTE — Progress Notes (Signed)
[facility], [place] 03/11/19  Subjective:   Patient remains critically ill Continued on CRRT Requiring multiple pressors, sedation Potasium critically elevated, changed to 0 K bath UF increased to 500 cc/hr last night, Total UF 4000 cc so far in last 24 hrs Fio2 60% CVP n/a- lines are femoral; IJ vascath   Objective:  Vital signs in last 24 hours:  Temp:  [97 F (36.1 C)-100.6 F (38.1 C)] 99.1 F (37.3 C) (06/03 0800) Pulse Rate:  [81-115] 93 (06/03 0800) Resp:  [0-25] 8 (06/03 0800) BP: (89-123)/(45-73) 90/61 (06/03 0800) SpO2:  [81 %-100 %] 97 % (06/03 0804) Arterial Line BP: (79-113)/(44-58) 104/49 (06/03 0800) FiO2 (%):  [60 %-100 %] 60 % (06/03 0804) Weight:  [107.7 kg] 107.7 kg (06/03 0408)  Weight change: 1.8 kg Filed Weights   03/08/19 2319 03/10/19 0319 03/11/19 0408  Weight: 90.7 kg 105.9 kg 107.7 kg    Intake/Output:    Intake/Output Summary (Last 24 hours) at 03/11/2019 0909 Last data filed at 03/11/2019 0854 Gross per 24 hour  Intake 6900.52 ml  Output 4630 ml  Net 2270.52 ml   General :        critically ill Head:              Normocephalic, without obvious abnormality, atraumatic Eyes/ENT:      ETT, OGT in place Neck:              Supple,  thyroid: not enlarged, no JVD Lungs:            Coarse, Vent assisted Heart:             Tachycardic, no rub or gallop Abdomen:      Soft, non-tender,   Extremities:   no cyanosis or edema Skin:               Skin color, texture, turgor normal, no rashes or lesions Neurologic:    sedated IJ vascath  Basic Metabolic Panel:  Recent Labs  Lab 03/09/19 0457 03/09/19 0819  03/09/19 2103  03/10/19 0431  03/10/19 2335 03/11/19 0115 03/11/19 0255 03/11/19 0451 03/11/19 0453 03/11/19 0751  NA 135 134*   < > 132*   < >  --    < > 137 138 139 138 139 138  K 2.7* 3.2*   < > 5.2*   < >  --    < > 6.7* 5.1 5.6* 5.9* 5.6* 5.5*  CL 106 106   < > 102   < >  --    < > 107 106 105 104 104  105  CO2 17* 16*   < > 17*   < >  --    < > _0 GLUCOSE 271* 288*   < > 543*   < >  --    < > 114* 146* 138* 145* 137* 163*  BUN 11 13   < > 23*   < >  --    < > 29* 30* 31* 32* 31* 33*  CREATININE 2.09* 2.47*   < > 4.16*   < >  --    < > 4.58* 4.55* 4.44* 4.58* 4.53* 4.79*  CALCIUM 8.5* 8.4*   < > 6.9*   < >  --    < > 7.5* 7.6* 7.9* 7.9* 8.0* 7.8*  MG 1.8 1.6*  --  2.3  --  2.0  --   --   --   --   --  1.9  --   PHOS 1.5* <1.0*   < > 5.8*  5.9*   < > 4.7*   < > 4.4 5.2* 5.9* 5.6*  --  6.7*   < > = values in this interval not displayed.     CBC: Recent Labs  Lab 03/08/19 2333 03/09/19 0457 03/10/19 0431 03/10/19 2257 03/11/19 0453  WBC 14.8* 32.7* 30.4* 28.5* 24.7*  NEUTROABS 7.6  --  23.9*  --   --   HGB 14.3 12.6* 11.4* 11.4* 10.8*  HCT 41.8 37.9* 33.1* 34.0* 32.8*  MCV 78.9* 82.2 79.0* 81.0 82.2  PLT 406* 393 240 168 124*     No results found for: HEPBSAG, HEPBSAB, HEPBIGM    Microbiology:  Recent Results (from the past 240 hour(s))  SARS Coronavirus 2 (CEPHEID - Performed in [place] hospital lab), Hosp Order     Status: None   Collection Time: 03/08/19 11:48 PM  Result Value Ref Range Status   SARS Coronavirus 2 NEGATIVE NEGATIVE Final    Comment: (NOTE) If result is NEGATIVE SARS-CoV-2 target nucleic acids are NOT DETECTED. The SARS-CoV-2 RNA is generally detectable in upper and lower  respiratory specimens during the acute phase of infection. The lowest  concentration of SARS-CoV-2 viral copies this assay can detect is 250  copies / mL. A negative result does not preclude SARS-CoV-2 infection  and should not be used as the sole basis for treatment or other  patient management decisions.  A negative result may occur with  improper specimen collection / handling, submission of specimen other  than nasopharyngeal swab, presence of viral mutation(s) within the  areas targeted by this assay, and inadequate number of viral copies  (<250 copies /  mL). A negative result must be combined with clinical  observations, patient history, and epidemiological information. If result is POSITIVE SARS-CoV-2 target nucleic acids are DETECTED. The SARS-CoV-2 RNA is generally detectable in upper and lower  respiratory specimens dur ing the acute phase of infection.  Positive  results are indicative of active infection with SARS-CoV-2.  Clinical  correlation with patient history and other diagnostic information is  necessary to determine patient infection status.  Positive results do  not rule out bacterial infection or co-infection with other viruses. If result is PRESUMPTIVE POSTIVE SARS-CoV-2 nucleic acids MAY BE PRESENT.   A presumptive positive result was obtained on the submitted specimen  and confirmed on repeat testing.  While 2019 novel coronavirus  (SARS-CoV-2) nucleic acids may be present in the submitted sample  additional confirmatory testing may be necessary for epidemiological  and / or clinical management purposes  to differentiate between  SARS-CoV-2 and other Sarbecovirus currently known to infect humans.  If clinically indicated additional testing with an alternate test  methodology [phone]) is advised. The SARS-CoV-2 RNA is generally  detectable in upper and lower respiratory sp ecimens during the acute  phase of infection. The expected result is Negative. Fact Sheet for Patients:  StrictlyIdeas.no Fact Sheet for Healthcare Providers: BankingDealers.co.za This test is not yet approved or cleared by the [place] FDA and has been authorized for detection and/or diagnosis of SARS-CoV-2 by FDA under an Emergency Use Authorization (EUA).  This EUA will remain in effect (meaning this test can be used) for the duration of the COVID-19 declaration under Section 564(b)(1) of the Act, 21 U.S.C. section 360bbb-3(b)(1), unless the authorization is terminated or revoked  sooner. Performed at [facility], [address], [place], [place] [address]  MRSA PCR Screening     Status: None   Collection Time: 03/09/19  2:48 AM  Result Value Ref Range Status   MRSA by PCR NEGATIVE NEGATIVE Final    Comment:        The GeneXpert MRSA Assay (FDA approved for NASAL specimens only), is one component of a comprehensive MRSA colonization surveillance program. It is not intended to diagnose MRSA infection nor to guide or monitor treatment for MRSA infections. Performed at [facility], [address] [place], [place] [address]   Culture, respiratory (non-expectorated)     Status: None (Preliminary result)   Collection Time: 03/09/19  8:00 AM  Result Value Ref Range Status   Specimen Description   Final    TRACHEAL ASPIRATE Performed at [facility], [address], [place], [place] [address]    Special Requests   Final    NONE Performed at [facility], [address] [place], [place] [address]    Gram Stain   Final    ABUNDANT WBC PRESENT,BOTH PMN AND MONONUCLEAR MODERATE GRAM POSITIVE COCCI RARE YEAST    Culture   Final    CULTURE REINCUBATED FOR BETTER GROWTH Performed at [facility], [place] [address] [place], [place] [address]    Report Status PENDING  Incomplete  CULTURE, BLOOD (ROUTINE X 2) w Reflex to ID Panel     Status: None (Preliminary result)   Collection Time: 03/09/19  2:53 PM  Result Value Ref Range Status   Specimen Description BLOOD BLOOD RIGHT ARM  Final   Special Requests   Final    BOTTLES DRAWN AEROBIC AND ANAEROBIC Blood Culture results may not be optimal due to an excessive volume of blood received in culture bottles   Culture  Setup Time PENDING  Incomplete   Culture   Final    NO GROWTH 2 DAYS Performed at [facility], [address] [place], [place] [address]    Report Status PENDING  Incomplete  CULTURE, BLOOD (ROUTINE X 2) w Reflex to ID Panel     Status:  None (Preliminary result)   Collection Time: 03/09/19  5:00 PM  Result Value Ref Range Status   Specimen Description BLOOD RIGHT ARM  Final   Special Requests   Final    BOTTLES DRAWN AEROBIC AND ANAEROBIC Blood Culture results may not be optimal due to an excessive volume of blood received in culture bottles   Culture  Setup Time PENDING  Incomplete   Culture   Final    NO GROWTH 2 DAYS Performed at [facility], [address], [place], [place] [address]    Report Status PENDING  Incomplete    Coagulation Studies: Recent Labs    03/08/19 11/21/31  LABPROT 12.0  INR 0.9    Urinalysis: Recent Labs    03/08/19 2326  COLORURINE YELLOW*  LABSPEC 1.021  PHURINE 5.0  GLUCOSEU NEGATIVE  HGBUR NEGATIVE  BILIRUBINUR NEGATIVE  KETONESUR NEGATIVE  PROTEINUR 30*  NITRITE NEGATIVE  LEUKOCYTESUR NEGATIVE      Imaging: Dg Chest Port 1 View  Result Date: 03/11/2019 CLINICAL DATA:  Acute respiratory failure EXAM: PORTABLE CHEST 1 VIEW COMPARISON:  03/10/2019 FINDINGS: Cardiac shadow is stable. Endotracheal tube, gastric catheter and left jugular central line are again seen and stable. Increasing vascular congestion is noted when compare with the prior exam with enlarging left pleural effusion. No focal confluent infiltrate is seen. IMPRESSION: Worsening vascular congestion with left-sided pleural effusion. Tubes and  lines as described. Electronically Signed   By: Inez Catalina M.D.   On: 03/11/2019 07:07   Dg Chest Port 1 View  Result Date: 03/10/2019 CLINICAL DATA:  Hypoxia.  Patient on a ventilator. EXAM: PORTABLE CHEST 1 VIEW COMPARISON:  Earlier film, same date. FINDINGS: The endotracheal tube is in good position, 5 cm above the carina. The NG tube is coursing down the esophagus and into the stomach. The left IJ central venous catheter is stable. The lungs demonstrate improved aeration compared to the earlier film. Less edema and atelectasis. No pneumothorax. IMPRESSION: 1.  Support apparatus in good position without complicating features. 2. Improved lung aeration since earlier chest film. Electronically Signed   By: Marijo Sanes M.D.   On: 03/10/2019 20:35   Portable Chest Xray  Result Date: 03/10/2019 CLINICAL DATA:  Respiratory failure EXAM: PORTABLE CHEST 1 VIEW COMPARISON:  03/09/2019 FINDINGS: Endotracheal tube tip at the clavicular heads. The orogastric tube at least reaches the stomach. Left IJ dialysis catheter tip at the upper cavoatrial junction. Worsening aeration with obscured left diaphragm. There is diffuse hazy lung opacity. Suspect layering pleural effusions. Cardiomegaly and vascular pedicle widening. IMPRESSION: Stable, unremarkable hardware positioning. Worsening aeration, likely atelectasis, layering fluid, and edema. Electronically Signed   By: Monte Fantasia M.D.   On: 03/10/2019 06:22   Dg Chest Port 1 View  Result Date: 03/09/2019 CLINICAL DATA:  Central line placement EXAM: PORTABLE CHEST 1 VIEW COMPARISON:  03/09/2019 FINDINGS: The endotracheal tube terminates above the carina. The newly placed left-sided central venous catheter tip projects over the SVC/distal left brachiocephalic vein. The enteric tube extends below the left hemidiaphragm. There is no left-sided pneumothorax. The heart size is stable. No right-sided pneumothorax. Coarsened lung markings are again identified bilaterally. IMPRESSION: 1. Newly placed left-sided central venous catheter as above with no evidence of a pneumothorax. 2. Remaining lines and tubes as above. 3. Stable cardiac size. Persistent coarse airspace opacities bilaterally similar to prior study. Electronically Signed   By: Constance Holster M.D.   On: 03/09/2019 19:58     Medications:   . fentaNYL infusion INTRAVENOUS 300 mcg/hr (03/11/19 0854)  . norepinephrine (LEVOPHED) Adult infusion 100 mcg/min (03/11/19 0854)  . phenylephrine (NEO-SYNEPHRINE) Adult infusion 75 mcg/min (03/11/19 0854)  .  piperacillin-tazobactam (ZOSYN)  IV 3.375 g (03/11/19 0545)  . pureflow 2,500 mL/hr at 03/11/19 0209  . vasopressin (PITRESSIN) infusion - *FOR SHOCK* 0.03 Units/min (03/10/19 1735)   . chlorhexidine gluconate (MEDLINE KIT)  15 mL Mouth Rinse BID  . Chlorhexidine Gluconate Cloth  6 each Topical Q0600  . heparin injection (subcutaneous)  5,000 Units Subcutaneous Q8H  . hydrocortisone sod succinate (SOLU-CORTEF) inj  50 mg Intravenous Q6H  . insulin aspart  2-6 Units Subcutaneous Q4H  . ipratropium-albuterol  3 mL Nebulization Q6H  . mouth rinse  15 mL Mouth Rinse 10 times per day  . pantoprazole (PROTONIX) IV  40 mg Intravenous Q12H  . sodium chloride flush  10-40 mL Intracatheter Q12H   bisacodyl, fentaNYL, heparin, midazolam, sennosides, sodium chloride flush  Assessment/ Plan:  28 y.o. male with HTN , was admitted on 03/08/2019 with ARF, acute resp failure secondary to drug overdose  1.  Oliguric acute renal failure with hyperkalemia and volume overload Minimal urine output, likely severe ATN Patient remains critically ill, severely hypotensive on multiple pressors CRRT started 6/1 Recommend to continue CRRT for metabolic and volume optimization  Continue 0 K bath for now UF 300 cc/hr at present, decrease the rate  today evening to 100 cc/hr  2.  Acute respiratory failure Ventilator assisted at present Fio2 60%  3. Metabolic acidosis -Expected to improve with hemodialysis support     LOS: Sharon 6/3/20209:09 AM  [place], [place]  Note: This note was prepared with Dragon dictation. Any transcription errors are unintentional

## 2019-03-12 LAB — RENAL FUNCTION PANEL
Albumin: 2.8 g/dL — ABNORMAL LOW (ref 3.5–5.0)
Albumin: 2.9 g/dL — ABNORMAL LOW (ref 3.5–5.0)
Albumin: 2.7 g/dL — ABNORMAL LOW (ref 3.5–5.0)
Albumin: 3 g/dL — ABNORMAL LOW (ref 3.5–5.0)
Albumin: 2.9 g/dL — ABNORMAL LOW (ref 3.5–5.0)
Albumin: 3.1 g/dL — ABNORMAL LOW (ref 3.5–5.0)
Anion gap: 8 (ref 5–15)
Anion gap: 8 (ref 5–15)
Anion gap: 10 (ref 5–15)
Anion gap: 14 (ref 5–15)
Anion gap: 10 (ref 5–15)
Anion gap: 13 (ref 5–15)
BUN: 32 mg/dL — ABNORMAL HIGH (ref 6–20)
BUN: 32 mg/dL — ABNORMAL HIGH (ref 6–20)
BUN: 31 mg/dL — ABNORMAL HIGH (ref 6–20)
BUN: 34 mg/dL — ABNORMAL HIGH (ref 6–20)
BUN: 38 mg/dL — ABNORMAL HIGH (ref 6–20)
BUN: 40 mg/dL — ABNORMAL HIGH (ref 6–20)
CO2: 24 mmol/L (ref 22–32)
CO2: 24 mmol/L (ref 22–32)
CO2: 23 mmol/L (ref 22–32)
CO2: 21 mmol/L — ABNORMAL LOW (ref 22–32)
CO2: 23 mmol/L (ref 22–32)
CO2: 21 mmol/L — ABNORMAL LOW (ref 22–32)
Calcium: 8.5 mg/dL — ABNORMAL LOW (ref 8.9–10.3)
Calcium: 8.2 mg/dL — ABNORMAL LOW (ref 8.9–10.3)
Calcium: 8.2 mg/dL — ABNORMAL LOW (ref 8.9–10.3)
Calcium: 8.1 mg/dL — ABNORMAL LOW (ref 8.9–10.3)
Calcium: 8.2 mg/dL — ABNORMAL LOW (ref 8.9–10.3)
Calcium: 8.2 mg/dL — ABNORMAL LOW (ref 8.9–10.3)
Chloride: 104 mmol/L (ref 98–111)
Chloride: 104 mmol/L (ref 98–111)
Chloride: 101 mmol/L (ref 98–111)
Chloride: 98 mmol/L (ref 98–111)
Chloride: 102 mmol/L (ref 98–111)
Chloride: 104 mmol/L (ref 98–111)
Creatinine, Ser: 3.85 mg/dL — ABNORMAL HIGH (ref 0.61–1.24)
Creatinine, Ser: 3.64 mg/dL — ABNORMAL HIGH (ref 0.61–1.24)
Creatinine, Ser: 3.41 mg/dL — ABNORMAL HIGH (ref 0.61–1.24)
Creatinine, Ser: 3.41 mg/dL — ABNORMAL HIGH (ref 0.61–1.24)
Creatinine, Ser: 3.73 mg/dL — ABNORMAL HIGH (ref 0.61–1.24)
Creatinine, Ser: 3.69 mg/dL — ABNORMAL HIGH (ref 0.61–1.24)
GFR calc Af Amer: 23 mL/min — ABNORMAL LOW (ref >60)
GFR calc Af Amer: 24 mL/min — ABNORMAL LOW (ref >60)
GFR calc Af Amer: 26 mL/min — ABNORMAL LOW (ref >60)
GFR calc Af Amer: 26 mL/min — ABNORMAL LOW (ref >60)
GFR calc Af Amer: 24 mL/min — ABNORMAL LOW (ref >60)
GFR calc Af Amer: 24 mL/min — ABNORMAL LOW (ref >60)
GFR calc non Af Amer: 20 mL/min — ABNORMAL LOW (ref >60)
GFR calc non Af Amer: 21 mL/min — ABNORMAL LOW (ref >60)
GFR calc non Af Amer: 23 mL/min — ABNORMAL LOW (ref >60)
GFR calc non Af Amer: 23 mL/min — ABNORMAL LOW (ref >60)
GFR calc non Af Amer: 20 mL/min — ABNORMAL LOW (ref >60)
GFR calc non Af Amer: 21 mL/min — ABNORMAL LOW (ref >60)
Glucose, Bld: 82 mg/dL (ref 70–99)
Glucose, Bld: 68 mg/dL — ABNORMAL LOW (ref 70–99)
Glucose, Bld: 193 mg/dL — ABNORMAL HIGH (ref 70–99)
Glucose, Bld: 200 mg/dL — ABNORMAL HIGH (ref 70–99)
Glucose, Bld: 182 mg/dL — ABNORMAL HIGH (ref 70–99)
Glucose, Bld: 164 mg/dL — ABNORMAL HIGH (ref 70–99)
Phosphorus: 5.5 mg/dL — ABNORMAL HIGH (ref 2.5–4.6)
Phosphorus: 4.4 mg/dL (ref 2.5–4.6)
Phosphorus: 4.6 mg/dL (ref 2.5–4.6)
Phosphorus: 4.4 mg/dL (ref 2.5–4.6)
Phosphorus: 5.2 mg/dL — ABNORMAL HIGH (ref 2.5–4.6)
Phosphorus: 4.7 mg/dL — ABNORMAL HIGH (ref 2.5–4.6)
Potassium: 3.8 mmol/L (ref 3.5–5.1)
Potassium: 3.6 mmol/L (ref 3.5–5.1)
Potassium: 4.3 mmol/L (ref 3.5–5.1)
Potassium: 4.4 mmol/L (ref 3.5–5.1)
Potassium: 4.3 mmol/L (ref 3.5–5.1)
Potassium: 4.3 mmol/L (ref 3.5–5.1)
Sodium: 136 mmol/L (ref 135–145)
Sodium: 136 mmol/L (ref 135–145)
Sodium: 134 mmol/L — ABNORMAL LOW (ref 135–145)
Sodium: 133 mmol/L — ABNORMAL LOW (ref 135–145)
Sodium: 135 mmol/L (ref 135–145)
Sodium: 138 mmol/L (ref 135–145)

## 2019-03-12 LAB — BASIC METABOLIC PANEL
Anion gap: 8 (ref 5–15)
Anion gap: 9 (ref 5–15)
Anion gap: 9 (ref 5–15)
BUN: 32 mg/dL — ABNORMAL HIGH (ref 6–20)
BUN: 31 mg/dL — ABNORMAL HIGH (ref 6–20)
BUN: 31 mg/dL — ABNORMAL HIGH (ref 6–20)
CO2: 24 mmol/L (ref 22–32)
CO2: 23 mmol/L (ref 22–32)
CO2: 24 mmol/L (ref 22–32)
Calcium: 8.5 mg/dL — ABNORMAL LOW (ref 8.9–10.3)
Calcium: 8.5 mg/dL — ABNORMAL LOW (ref 8.9–10.3)
Calcium: 8.4 mg/dL — ABNORMAL LOW (ref 8.9–10.3)
Chloride: 104 mmol/L (ref 98–111)
Chloride: 103 mmol/L (ref 98–111)
Chloride: 105 mmol/L (ref 98–111)
Creatinine, Ser: 3.86 mg/dL — ABNORMAL HIGH (ref 0.61–1.24)
Creatinine, Ser: 3.62 mg/dL — ABNORMAL HIGH (ref 0.61–1.24)
Creatinine, Ser: 3.61 mg/dL — ABNORMAL HIGH (ref 0.61–1.24)
GFR calc Af Amer: 23 mL/min — ABNORMAL LOW (ref >60)
GFR calc Af Amer: 25 mL/min — ABNORMAL LOW (ref >60)
GFR calc Af Amer: 25 mL/min — ABNORMAL LOW (ref >60)
GFR calc non Af Amer: 20 mL/min — ABNORMAL LOW (ref >60)
GFR calc non Af Amer: 21 mL/min — ABNORMAL LOW (ref >60)
GFR calc non Af Amer: 21 mL/min — ABNORMAL LOW (ref >60)
Glucose, Bld: 80 mg/dL (ref 70–99)
Glucose, Bld: 80 mg/dL (ref 70–99)
Glucose, Bld: 77 mg/dL (ref 70–99)
Potassium: 3.7 mmol/L (ref 3.5–5.1)
Potassium: 3.6 mmol/L (ref 3.5–5.1)
Potassium: 3.8 mmol/L (ref 3.5–5.1)
Sodium: 136 mmol/L (ref 135–145)
Sodium: 135 mmol/L (ref 135–145)
Sodium: 138 mmol/L (ref 135–145)

## 2019-03-12 LAB — GLUCOSE, CAPILLARY
Glucose-Capillary: 73 mg/dL (ref 70–99)
Glucose-Capillary: 76 mg/dL (ref 70–99)
Glucose-Capillary: 81 mg/dL (ref 70–99)
Glucose-Capillary: 75 mg/dL (ref 70–99)
Glucose-Capillary: 115 mg/dL — ABNORMAL HIGH (ref 70–99)
Glucose-Capillary: 58 mg/dL — ABNORMAL LOW (ref 70–99)
Glucose-Capillary: 65 mg/dL — ABNORMAL LOW (ref 70–99)
Glucose-Capillary: 168 mg/dL — ABNORMAL HIGH (ref 70–99)
Glucose-Capillary: 200 mg/dL — ABNORMAL HIGH (ref 70–99)
Glucose-Capillary: 69 mg/dL — ABNORMAL LOW (ref 70–99)
Glucose-Capillary: 178 mg/dL — ABNORMAL HIGH (ref 70–99)
Glucose-Capillary: 199 mg/dL — ABNORMAL HIGH (ref 70–99)
Glucose-Capillary: 178 mg/dL — ABNORMAL HIGH (ref 70–99)
Glucose-Capillary: 178 mg/dL — ABNORMAL HIGH (ref 70–99)
Glucose-Capillary: 163 mg/dL — ABNORMAL HIGH (ref 70–99)

## 2019-03-12 LAB — URINE CULTURE: Culture: NO GROWTH

## 2019-03-12 LAB — TROPONIN I
Troponin I: 0.24 ng/mL (ref <0.03)
Troponin I: 0.29 ng/mL (ref <0.03)

## 2019-03-12 LAB — CBC
HCT: 28.1 % — ABNORMAL LOW (ref 39.0–52.0)
Hemoglobin: 9.6 g/dL — ABNORMAL LOW (ref 13.0–17.0)
MCH: 27.1 pg (ref 26.0–34.0)
MCHC: 34.2 g/dL (ref 30.0–36.0)
MCV: 79.4 fL — ABNORMAL LOW (ref 80.0–100.0)
Platelets: 156 10*3/uL (ref 150–400)
RBC: 3.54 MIL/uL — ABNORMAL LOW (ref 4.22–5.81)
RDW: 12.9 % (ref 11.5–15.5)
WBC: 23.8 10*3/uL — ABNORMAL HIGH (ref 4.0–10.5)
nRBC: 0.1 % (ref 0.0–0.2)

## 2019-03-12 LAB — HAPTOGLOBIN: Haptoglobin: 191 mg/dL (ref 17–317)

## 2019-03-12 LAB — MAGNESIUM: Magnesium: 2 mg/dL (ref 1.7–2.4)

## 2019-03-12 MED ORDER — ALTEPLASE 2 MG IJ SOLR
2.0000 mg | Freq: Once | INTRAMUSCULAR | Status: AC
  Administered 2019-03-12: 2 mg
  Filled 2019-03-12: qty 2

## 2019-03-12 MED ORDER — MORPHINE SULFATE (PF) 2 MG/ML IV SOLN
2.0000 mg | Freq: Once | INTRAVENOUS | Status: AC
  Administered 2019-03-12: 2 mg via INTRAVENOUS
  Filled 2019-03-12: qty 1

## 2019-03-12 MED ORDER — DEXMEDETOMIDINE HCL IN NACL 400 MCG/100ML IV SOLN
0.4000 ug/kg/h | INTRAVENOUS | Status: DC
  Administered 2019-03-12: 0.4 ug/kg/h via INTRAVENOUS
  Administered 2019-03-13 (×2): 1 ug/kg/h via INTRAVENOUS
  Filled 2019-03-12 (×2): qty 100

## 2019-03-12 MED ORDER — MORPHINE SULFATE (PF) 2 MG/ML IV SOLN
2.0000 mg | Freq: Once | INTRAVENOUS | Status: AC
  Administered 2019-03-12: 2 mg via INTRAVENOUS

## 2019-03-12 MED ORDER — ORAL CARE MOUTH RINSE
15.0000 mL | Freq: Two times a day (BID) | OROMUCOSAL | Status: DC
  Administered 2019-03-13: 15 mL via OROMUCOSAL

## 2019-03-12 MED ORDER — ALBUMIN HUMAN 25 % IV SOLN
12.5000 g | Freq: Two times a day (BID) | INTRAVENOUS | Status: DC
  Administered 2019-03-12 – 2019-03-22 (×22): 12.5 g via INTRAVENOUS
  Filled 2019-03-12 (×25): qty 50

## 2019-03-12 MED ORDER — MORPHINE SULFATE (PF) 2 MG/ML IV SOLN
2.0000 mg | INTRAVENOUS | Status: DC | PRN
  Administered 2019-03-12: 2 mg via INTRAVENOUS
  Filled 2019-03-12 (×2): qty 1

## 2019-03-12 MED ORDER — DEXTROSE 50 % IV SOLN
25.0000 mL | Freq: Once | INTRAVENOUS | Status: AC
  Administered 2019-03-12: 25 mL via INTRAVENOUS
  Filled 2019-03-12: qty 50

## 2019-03-12 MED ORDER — MIDODRINE HCL 5 MG PO TABS
5.0000 mg | ORAL_TABLET | Freq: Three times a day (TID) | ORAL | Status: DC
  Administered 2019-03-13 – 2019-03-14 (×5): 5 mg via ORAL
  Filled 2019-03-12 (×9): qty 1

## 2019-03-12 MED ORDER — MORPHINE SULFATE (PF) 2 MG/ML IV SOLN
2.0000 mg | INTRAVENOUS | Status: DC | PRN
  Administered 2019-03-12 – 2019-03-18 (×20): 2 mg via INTRAVENOUS
  Filled 2019-03-12 (×20): qty 1

## 2019-03-12 MED ORDER — STERILE WATER FOR INJECTION IJ SOLN
INTRAMUSCULAR | Status: AC
  Administered 2019-03-12: 15:00:00
  Filled 2019-03-12: qty 10

## 2019-03-12 NOTE — Progress Notes (Signed)
[facility], [place] 03/12/19  Subjective:   Patient remains critically ill Continued on CRRT Requiring multiple pressors,  Another 4000 cc removed with CRRT Extubated this morning Able to follow commands   Objective:  Vital signs in last 24 hours:  Temp:  [96.4 F (35.8 C)-97.9 F (36.6 C)] 97.5 F (36.4 C) (06/04 0800) Pulse Rate:  [73-100] 88 (06/04 0800) Resp:  [0-24] 12 (06/04 0800) BP: (76-140)/(38-72) 129/51 (06/04 0800) SpO2:  [90 %-98 %] 93 % (06/04 0800) Arterial Line BP: (85-142)/(41-68) 123/63 (06/04 0800) FiO2 (%):  [0.3 %-30 %] 30 % (06/04 0725) Weight:  [108.9 kg] 108.9 kg (06/04 0500)  Weight change: 1.2 kg Filed Weights   03/10/19 0319 03/11/19 0408 03/12/19 0500  Weight: 105.9 kg 107.7 kg 108.9 kg    Intake/Output:    Intake/Output Summary (Last 24 hours) at 03/12/2019 0913 Last data filed at 03/12/2019 0800 Gross per 24 hour  Intake 6751.42 ml  Output 4163 ml  Net 2588.42 ml   General :        critically ill Head:              Normocephalic, without obvious abnormality, atraumatic Eyes/ENT:      moist oral mucus membranes Neck:              Supple,  thyroid: not enlarged, no JVD Lungs:            Coarse,  [place] O2 Heart:             Tachycardic, no rub or gallop Abdomen:      Soft, non-tender,   Extremities:   no cyanosis, + dependent edema Skin:               Skin color, texture, turgor normal, no rashes or lesions Neurologic:    alert, able to follow commands IJ vascath  Basic Metabolic Panel:  Recent Labs  Lab 03/09/19 0819  03/09/19 2103  03/10/19 0431  03/11/19 0453  03/11/19 1520 03/11/19 2056  03/12/19 0048 03/12/19 0146 03/12/19 0236 03/12/19 0336 03/12/19 0528 03/12/19 0752  NA 134*   < > 132*   < >  --    < > 139   < > 137 137   < > 136 136 135 138 136 134*  K 3.2*   < > 5.2*   < >  --    < > 5.6*   < > 4.1 3.7   < > 3.8 3.7 3.6 3.8 3.6 4.3  CL 106   < > 102   < >  --    < > 104   < > 105 103    < > 104 104 103 105 104 101  CO2 16*   < > 17*   < >  --    < > 23   < > 20* 23   < > '24 24 23 24 24 23  '$ GLUCOSE 288*   < > 543*   < >  --    < > 137*   < > 130* 105*   < > 82 80 80 77 68* 193*  BUN 13   < > 23*   < >  --    < > 31*   < > 34* 32*   < > 32* 32* 31* 31* 32* 31*  CREATININE 2.47*   < > 4.16*   < >  --    < > 4.53*   < >  4.30* 3.97*   < > 3.85* 3.86* 3.62* 3.61* 3.64* 3.41*  CALCIUM 8.4*   < > 6.9*   < >  --    < > 8.0*   < > 8.1* 8.4*   < > 8.5* 8.5* 8.5* 8.4* 8.2* 8.2*  MG 1.6*  --  2.3  --  2.0  --  1.9  --   --   --   --   --  2.0  --   --   --   --   PHOS <1.0*   < > 5.8*  5.9*   < > 4.7*   < >  --    < > 6.3* 5.4*  --  5.5*  --   --   --  4.4 4.6   < > = values in this interval not displayed.     CBC: Recent Labs  Lab 03/08/19 2333 03/09/19 0457 03/10/19 0431 03/10/19 2257 03/11/19 0453 03/12/19 0146  WBC 14.8* 32.7* 30.4* 28.5* 24.7* 23.8*  NEUTROABS 7.6  --  23.9*  --   --   --   HGB 14.3 12.6* 11.4* 11.4* 10.8* 9.6*  HCT 41.8 37.9* 33.1* 34.0* 32.8* 28.1*  MCV 78.9* 82.2 79.0* 81.0 82.2 79.4*  PLT 406* 393 240 168 124* 156     No results found for: HEPBSAG, HEPBSAB, HEPBIGM    Microbiology:  Recent Results (from the past 240 hour(s))  SARS Coronavirus 2 (CEPHEID - Performed in [place] hospital lab), Hosp Order     Status: None   Collection Time: 03/08/19 11:48 PM  Result Value Ref Range Status   SARS Coronavirus 2 NEGATIVE NEGATIVE Final    Comment: (NOTE) If result is NEGATIVE SARS-CoV-2 target nucleic acids are NOT DETECTED. The SARS-CoV-2 RNA is generally detectable in upper and lower  respiratory specimens during the acute phase of infection. The lowest  concentration of SARS-CoV-2 viral copies this assay can detect is 250  copies / mL. A negative result does not preclude SARS-CoV-2 infection  and should not be used as the sole basis for treatment or other  patient management decisions.  A negative result may occur with  improper  specimen collection / handling, submission of specimen other  than nasopharyngeal swab, presence of viral mutation(s) within the  areas targeted by this assay, and inadequate number of viral copies  (<250 copies / mL). A negative result must be combined with clinical  observations, patient history, and epidemiological information. If result is POSITIVE SARS-CoV-2 target nucleic acids are DETECTED. The SARS-CoV-2 RNA is generally detectable in upper and lower  respiratory specimens dur ing the acute phase of infection.  Positive  results are indicative of active infection with SARS-CoV-2.  Clinical  correlation with patient history and other diagnostic information is  necessary to determine patient infection status.  Positive results do  not rule out bacterial infection or co-infection with other viruses. If result is PRESUMPTIVE POSTIVE SARS-CoV-2 nucleic acids MAY BE PRESENT.   A presumptive positive result was obtained on the submitted specimen  and confirmed on repeat testing.  While 2019 novel coronavirus  (SARS-CoV-2) nucleic acids may be present in the submitted sample  additional confirmatory testing may be necessary for epidemiological  and / or clinical management purposes  to differentiate between  SARS-CoV-2 and other Sarbecovirus currently known to infect humans.  If clinically indicated additional testing with an alternate test  methodology [phone]) is advised. The SARS-CoV-2 RNA is generally  detectable in upper and  lower respiratory sp ecimens during the acute  phase of infection. The expected result is Negative. Fact Sheet for Patients:  StrictlyIdeas.no Fact Sheet for Healthcare Providers: BankingDealers.co.za This test is not yet approved or cleared by the [place] FDA and has been authorized for detection and/or diagnosis of SARS-CoV-2 by FDA under an Emergency Use Authorization (EUA).  This EUA will remain in  effect (meaning this test can be used) for the duration of the COVID-19 declaration under Section 564(b)(1) of the Act, 21 U.S.C. section 360bbb-3(b)(1), unless the authorization is terminated or revoked sooner. Performed at [facility], [address] [place], [place] [address]   MRSA PCR Screening     Status: None   Collection Time: 03/09/19  2:48 AM  Result Value Ref Range Status   MRSA by PCR NEGATIVE NEGATIVE Final    Comment:        The GeneXpert MRSA Assay (FDA approved for NASAL specimens only), is one component of a comprehensive MRSA colonization surveillance program. It is not intended to diagnose MRSA infection nor to guide or monitor treatment for MRSA infections. Performed at [facility], [address] [place], [place] [address]   Culture, respiratory (non-expectorated)     Status: None   Collection Time: 03/09/19  8:00 AM  Result Value Ref Range Status   Specimen Description   Final    TRACHEAL ASPIRATE Performed at [facility], [address], [place], [place] [address]    Special Requests   Final    NONE Performed at [facility], [address] [place], [place] [address]    Gram Stain   Final    ABUNDANT WBC PRESENT,BOTH PMN AND MONONUCLEAR MODERATE GRAM POSITIVE COCCI RARE YEAST    Culture   Final    MODERATE GROUP B STREP(S.AGALACTIAE)ISOLATED TESTING AGAINST S. AGALACTIAE NOT ROUTINELY PERFORMED DUE TO PREDICTABILITY OF AMP/PEN/VAN SUSCEPTIBILITY. Performed at [facility], [place] [address], [place], [place] [address]    Report Status 03/11/2019 FINAL  Final  CULTURE, BLOOD (ROUTINE X 2) w Reflex to ID Panel     Status: None (Preliminary result)   Collection Time: 03/09/19  2:53 PM  Result Value Ref Range Status   Specimen Description BLOOD BLOOD RIGHT ARM  Final   Special Requests   Final    BOTTLES DRAWN AEROBIC AND ANAEROBIC Blood Culture results may not be optimal due to an excessive volume  of blood received in culture bottles   Culture  Setup Time PENDING  Incomplete   Culture   Final    NO GROWTH 3 DAYS Performed at [facility], [address] [place], [place] [address]    Report Status PENDING  Incomplete  CULTURE, BLOOD (ROUTINE X 2) w Reflex to ID Panel     Status: None (Preliminary result)   Collection Time: 03/09/19  5:00 PM  Result Value Ref Range Status   Specimen Description BLOOD RIGHT ARM  Final   Special Requests   Final    BOTTLES DRAWN AEROBIC AND ANAEROBIC Blood Culture results may not be optimal due to an excessive volume of blood received in culture bottles   Culture  Setup Time PENDING  Incomplete   Culture   Final    NO GROWTH 3 DAYS Performed at [facility], [address], [place], [place] [address]    Report Status PENDING  Incomplete  Urine Culture     Status: None   Collection Time: 03/11/19  4:33  AM  Result Value Ref Range Status   Specimen Description   Final    URINE, RANDOM Performed at [facility], [address] [place], [place] [address]    Special Requests   Final    NONE Performed at [facility], [address] [place], [place] [address]    Culture   Final    NO GROWTH Performed at [facility], [address] [place], [place] [address]    Report Status 03/12/2019 FINAL  Final    Coagulation Studies: No results for input(s): LABPROT, INR in the last 72 hours.  Urinalysis: No results for input(s): COLORURINE, LABSPEC, PHURINE, GLUCOSEU, HGBUR, BILIRUBINUR, KETONESUR, PROTEINUR, UROBILINOGEN, NITRITE, LEUKOCYTESUR in the last 72 hours.  Invalid input(s): APPERANCEUR    Imaging: Dg Chest Port 1 View  Result Date: 03/11/2019 CLINICAL DATA:  Acute respiratory failure EXAM: PORTABLE CHEST 1 VIEW COMPARISON:  03/10/2019 FINDINGS: Cardiac shadow is stable. Endotracheal tube, gastric catheter and left jugular central line are again seen and stable. Increasing vascular  congestion is noted when compare with the prior exam with enlarging left pleural effusion. No focal confluent infiltrate is seen. IMPRESSION: Worsening vascular congestion with left-sided pleural effusion. Tubes and lines as described. Electronically Signed   By: Inez Catalina M.D.   On: 03/11/2019 07:07   Dg Chest Port 1 View  Result Date: 03/10/2019 CLINICAL DATA:  Hypoxia.  Patient on a ventilator. EXAM: PORTABLE CHEST 1 VIEW COMPARISON:  Earlier film, same date. FINDINGS: The endotracheal tube is in good position, 5 cm above the carina. The NG tube is coursing down the esophagus and into the stomach. The left IJ central venous catheter is stable. The lungs demonstrate improved aeration compared to the earlier film. Less edema and atelectasis. No pneumothorax. IMPRESSION: 1. Support apparatus in good position without complicating features. 2. Improved lung aeration since earlier chest film. Electronically Signed   By: Marijo Sanes M.D.   On: 03/10/2019 20:35     Medications:   . cefTRIAXone (ROCEPHIN)  IV Stopped (03/11/19 1743)  . dextrose 125 mL/hr at 03/12/19 0642  . fentaNYL infusion INTRAVENOUS 300 mcg/hr (03/12/19 0300)  . norepinephrine (LEVOPHED) Adult infusion 50 mcg/min (03/12/19 0911)  . phenylephrine (NEO-SYNEPHRINE) Adult infusion 60 mcg/min (03/12/19 0501)  . pureflow 2,500 mL/hr at 03/11/19 2343  . vasopressin (PITRESSIN) infusion - *FOR SHOCK* 0.03 Units/min (03/12/19 0300)   . chlorhexidine gluconate (MEDLINE KIT)  15 mL Mouth Rinse BID  . Chlorhexidine Gluconate Cloth  6 each Topical Q0600  . heparin injection (subcutaneous)  5,000 Units Subcutaneous Q8H  . hydrocortisone sod succinate (SOLU-CORTEF) inj  50 mg Intravenous Q6H  . ipratropium-albuterol  3 mL Nebulization Q6H  . mouth rinse  15 mL Mouth Rinse 10 times per day  . pantoprazole (PROTONIX) IV  40 mg Intravenous Q12H  . sodium chloride flush  10-40 mL Intracatheter Q12H   bisacodyl, fentaNYL, heparin,  midazolam, sennosides, sodium chloride flush  Assessment/ Plan:  28 y.o.caucasian male with HTN , was admitted on 03/08/2019 with ARF, acute resp failure secondary to drug overdose  1.  Oliguric acute renal failure with hyperkalemia and volume overload Minimal urine output, likely severe ATN Patient remains critically ill, severely hypotensive on multiple pressors CRRT started 6/1 Recommend to continue CRRT for metabolic and volume optimization  Continue 2 K bath  UF 150 cc/hr at present  add low dose albumin support for 3rd spacing of fluid  2.  Acute respiratory failure Extubated 6/4  am  3. Metabolic acidosis -Improved with hemodialysis support  4. Drug overdose multiple tablets of Phenergan, losartan, amlodipine, clonidine IVC Sitter at bedside     LOS: [place] 6/4/20209:13 [place], [place]  Note: This note was prepared with Dragon dictation. Any transcription errors are unintentional

## 2019-03-12 NOTE — Consult Note (Signed)
Pharmacy Electrolyte Monitoring Consult:  Pharmacy consulted to assist in monitoring and replacing electrolytes in this      Labs:   Potassium (mmol/L)  Date Value  03/12/2019 4.3   Magnesium (mg/dL)  Date Value  03/12/2019 2.0   Phosphorus (mg/dL)  Date Value  03/12/2019 5.2 (H)   Calcium (mg/dL)  Date Value  03/12/2019 8.2 (L)   Albumin (g/dL)  Date Value  03/12/2019 2.9 (L)   Corrected Ca: 8.6 mg/dL  Assessment/Plan: 28 year old male with a medical history as indicated below who presented to the ED after taking 38 tablets of Phenergan, 19 tablets of losartan, 42 tablets of amlodipine, and 78 tablets of clonazepam and noted electrolyte abnormalities.  Patient is currently extubated and receiving 2K CRRT bath. MIVF have been discontinued.   No further adjustments warranted at this time. Pharmacy will continue to follow along with scheduled renal function panels scheduled Q4hr.   Pharmacy will continue to monitor and adjust per consult.

## 2019-03-12 NOTE — Progress Notes (Signed)
CRITICAL CARE NOTE  CC  follow up respiratory failure  SUBJECTIVE Patient remains critically ill Prognosis is guarded Multiple pressors On CRRT  Vent Mode: PRVC FiO2 (%):  [0.3 %-80 %] 30 % Set Rate:  [24 bmp] 24 bmp Vt Set:  [500 mL] 500 mL PEEP:  [12 cmH20] 12 cmH20 Plateau Pressure:  [14 cmH20-16 cmH20] 16 cmH20     BP (!) 125/54   Pulse 86   Temp (!) 97.2 F (36.2 C)   Resp (!) 21   Ht 5' 7.01" (1.702 m)   Wt 108.9 kg   SpO2 93%   BMI 37.59 kg/m    I/O last 3 completed shifts: In: 8694.7 [I.V.:7910.1; IV Piggyback:784.6] Out: 8075 [Urine:185; Emesis/NG output:250; Other:7640] No intake/output data recorded.  SpO2: 93 % FiO2 (%): 30 %   SIGNIFICANT EVENTS 5/31 admitted for severe shock and resp failure DRUG OD 6/1 started CRRT multiorgan failure, resp failure, vasopressors 6/2 severe hypoxia UF rates increased to 500 6/3 DNR, multiorgan failure  REVIEW OF SYSTEMS  PATIENT IS UNABLE TO PROVIDE COMPLETE REVIEW OF SYSTEMS DUE TO SEVERE CRITICAL ILLNESS   PHYSICAL EXAMINATION:  GENERAL:critically ill appearing, +resp distress HEAD: Normocephalic, atraumatic.  EYES: Pupils equal, round, reactive to light.  No scleral icterus.  MOUTH: Moist mucosal membrane. NECK: Supple. No thyromegaly. No nodules. No JVD.  PULMONARY: +rhonchi, +wheezing CARDIOVASCULAR: S1 and S2. Regular rate and rhythm. No murmurs, rubs, or gallops.  GASTROINTESTINAL: Soft, nontender, -distended. No masses. Positive bowel sounds. No hepatosplenomegaly.  MUSCULOSKELETAL: No swelling, clubbing, or edema.  NEUROLOGIC: obtunded, GCS<8 SKIN:intact,warm,dry  MEDICATIONS: I have reviewed all medications and confirmed regimen as documented   CULTURE RESULTS   Recent Results (from the past 240 hour(s))  SARS Coronavirus 2 (CEPHEID - Performed in [place] hospital lab), Hosp Order     Status: None   Collection Time: 03/08/19 11:48 PM  Result Value Ref Range Status   SARS Coronavirus  2 NEGATIVE NEGATIVE Final    Comment: (NOTE) If result is NEGATIVE SARS-CoV-2 target nucleic acids are NOT DETECTED. The SARS-CoV-2 RNA is generally detectable in upper and lower  respiratory specimens during the acute phase of infection. The lowest  concentration of SARS-CoV-2 viral copies this assay can detect is 250  copies / mL. A negative result does not preclude SARS-CoV-2 infection  and should not be used as the sole basis for treatment or other  patient management decisions.  A negative result may occur with  improper specimen collection / handling, submission of specimen other  than nasopharyngeal swab, presence of viral mutation(s) within the  areas targeted by this assay, and inadequate number of viral copies  (<250 copies / mL). A negative result must be combined with clinical  observations, patient history, and epidemiological information. If result is POSITIVE SARS-CoV-2 target nucleic acids are DETECTED. The SARS-CoV-2 RNA is generally detectable in upper and lower  respiratory specimens dur ing the acute phase of infection.  Positive  results are indicative of active infection with SARS-CoV-2.  Clinical  correlation with patient history and other diagnostic information is  necessary to determine patient infection status.  Positive results do  not rule out bacterial infection or co-infection with other viruses. If result is PRESUMPTIVE POSTIVE SARS-CoV-2 nucleic acids MAY BE PRESENT.   A presumptive positive result was obtained on the submitted specimen  and confirmed on repeat testing.  While 2019 novel coronavirus  (SARS-CoV-2) nucleic acids may be present in the submitted sample  additional confirmatory testing  may be necessary for epidemiological  and / or clinical management purposes  to differentiate between  SARS-CoV-2 and other Sarbecovirus currently known to infect humans.  If clinically indicated additional testing with an alternate test  methodology  [phone]) is advised. The SARS-CoV-2 RNA is generally  detectable in upper and lower respiratory sp ecimens during the acute  phase of infection. The expected result is Negative. Fact Sheet for Patients:  StrictlyIdeas.no Fact Sheet for Healthcare Providers: BankingDealers.co.za This test is not yet approved or cleared by the [place] FDA and has been authorized for detection and/or diagnosis of SARS-CoV-2 by FDA under an Emergency Use Authorization (EUA).  This EUA will remain in effect (meaning this test can be used) for the duration of the COVID-19 declaration under Section 564(b)(1) of the Act, 21 U.S.C. section 360bbb-3(b)(1), unless the authorization is terminated or revoked sooner. Performed at [facility], [address] [place], [place] [address]   MRSA PCR Screening     Status: None   Collection Time: 03/09/19  2:48 AM  Result Value Ref Range Status   MRSA by PCR NEGATIVE NEGATIVE Final    Comment:        The GeneXpert MRSA Assay (FDA approved for NASAL specimens only), is one component of a comprehensive MRSA colonization surveillance program. It is not intended to diagnose MRSA infection nor to guide or monitor treatment for MRSA infections. Performed at [facility], [address] [place], [place] [address]   Culture, respiratory (non-expectorated)     Status: None   Collection Time: 03/09/19  8:00 AM  Result Value Ref Range Status   Specimen Description   Final    TRACHEAL ASPIRATE Performed at [facility], [address] [place], [place] [address]    Special Requests   Final    NONE Performed at [facility], [address] [place], [place] [address]    Gram Stain   Final    ABUNDANT WBC PRESENT,BOTH PMN AND MONONUCLEAR MODERATE GRAM POSITIVE COCCI RARE YEAST    Culture   Final    MODERATE GROUP B STREP(S.AGALACTIAE)ISOLATED TESTING AGAINST S. AGALACTIAE  NOT ROUTINELY PERFORMED DUE TO PREDICTABILITY OF AMP/PEN/VAN SUSCEPTIBILITY. Performed at [facility], [place] [address] [place], [place] [address]    Report Status 03/11/2019 FINAL  Final  CULTURE, BLOOD (ROUTINE X 2) w Reflex to ID Panel     Status: None (Preliminary result)   Collection Time: 03/09/19  2:53 PM  Result Value Ref Range Status   Specimen Description BLOOD BLOOD RIGHT ARM  Final   Special Requests   Final    BOTTLES DRAWN AEROBIC AND ANAEROBIC Blood Culture results may not be optimal due to an excessive volume of blood received in culture bottles   Culture  Setup Time PENDING  Incomplete   Culture   Final    NO GROWTH 3 DAYS Performed at [facility], [address], [place], [place] [address]    Report Status PENDING  Incomplete  CULTURE, BLOOD (ROUTINE X 2) w Reflex to ID Panel     Status: None (Preliminary result)   Collection Time: 03/09/19  5:00 PM  Result Value Ref Range Status   Specimen Description BLOOD RIGHT ARM  Final   Special Requests   Final    BOTTLES DRAWN AEROBIC AND ANAEROBIC Blood Culture results may not be optimal due to an excessive volume of blood received in culture bottles   Culture  Setup Time PENDING  Incomplete   Culture   Final    NO GROWTH 3 DAYS Performed at [facility], [place], [place] [address]    Report Status PENDING  Incomplete        CBC    Component Value Date/Time   WBC 23.8 (H) 03/12/2019 0146   RBC 3.54 (L) 03/12/2019 0146   HGB 9.6 (L) 03/12/2019 0146   HCT 28.1 (L) 03/12/2019 0146   PLT 156 03/12/2019 0146   MCV 79.4 (L) 03/12/2019 0146   MCH 27.1 03/12/2019 0146   MCHC 34.2 03/12/2019 0146   RDW 12.9 03/12/2019 0146   LYMPHSABS 2.5 03/10/2019 0431   MONOABS 3.0 (H) 03/10/2019 0431   EOSABS 0.0 03/10/2019 0431   BASOSABS 0.1 03/10/2019 0431    BMP Latest Ref Rng & Units 03/12/2019 03/12/2019 03/12/2019  Glucose 70 - 99 mg/dL 68(L) 77 80  BUN 6 - 20 mg/dL 32(H) 31(H) 31(H)   Creatinine 0.61 - 1.24 mg/dL 3.64(H) 3.61(H) 3.62(H)  Sodium 135 - 145 mmol/L 136 138 135  Potassium 3.5 - 5.1 mmol/L 3.6 3.8 3.6  Chloride 98 - 111 mmol/L 104 105 103  CO2 22 - 32 mmol/L _0 Calcium 8.9 - 10.3 mg/dL 8.2(L) 8.4(L) 8.5(L)        Indwelling Urinary Catheter continued, requirement due to   Reason to continue Indwelling Urinary Catheter strict Intake/Output monitoring for hemodynamic instability   Central Line/ continued, requirement due to  Reason to continue Gravity of central venous pressure or other hemodynamic parameters and poor IV access   Ventilator continued, requirement due to severe respiratory failure   Ventilator Sedation RASS 0 to -2      ASSESSMENT AND PLAN SYNOPSIS  28 year old white male admitted to the ICU for acute drug overdose and toxicity mostly from calcium channel blocker in the setting of multiorgan failure with severe hypoxic respiratory failure,distributive shock and renal failure with metabolic encephalopathy    Severe ACUTE Hypoxic and Hypercapnic Respiratory Failure -continue Full MV support -continue Bronchodilator Therapy -Wean Fio2 and PEEP as tolerated -will perform SAT/SBT when respiratory parameters are met GBS sputum culture+   ACUTE KIDNEY INJURY/Renal Failure -follow chem 7 -follow UO -continue Foley Catheter-assess need -Avoid nephrotoxic agents -Recheck creatinine  On CRRT   NEUROLOGY - intubated and sedated - minimal sedation to achieve a RASS goal: -1 Wake up assessment pending   SHOCK-SEPSIS/HYPOVOLUMIC/DISTRIBUTIVE -use vasopressors to keep MAP>65 -follow ABG and LA -follow up cultures -emperic ABX -stress dose steroids   CARDIAC ICU monitoring  ID -continue IV abx as prescibed -follow up cultures  GI GI PROPHYLAXIS as indicated  NUTRITIONAL STATUS DIET-->on hold Constipation protocol as indicated  ENDO - will use ICU hypoglycemic\Hyperglycemia protocol if  indicated   ELECTROLYTES -follow labs as needed -replace as needed -pharmacy consultation and following   DVT/GI PRX ordered TRANSFUSIONS AS NEEDED MONITOR FSBS ASSESS the need for LABS as needed   Critical Care Time devoted to patient care services described in this note is 32 minutes.   Overall, patient is critically ill, prognosis is guarded.  Patient with Multiorgan failure and at high risk for cardiac arrest and death.    Corrin Parker, M.D.  [facility]  Medical Director [place] Director [facility] Cardio-Pulmonary Department

## 2019-03-12 NOTE — Progress Notes (Signed)
Dana NP notified of troponin of 0.24. Will trend troponin q 6 times 2. At this time patient does not have any chest pain or shortness of breath.

## 2019-03-12 NOTE — Progress Notes (Signed)
Assisted tele visit to patient with family member.  Anaysia Germer Samson, RN  

## 2019-03-12 NOTE — Progress Notes (Signed)
Pt was suctioned for a small amount of thick tan secretions. Per Dr. Zoila Shutter order, he was extubated to a 4L nasal cannula. Sa02 are 92%.

## 2019-03-12 NOTE — Progress Notes (Signed)
Assisted tele visit to patient with family member.  Cailee Blanke R, RN  

## 2019-03-12 NOTE — Progress Notes (Signed)
[facility] [place] NAME: Glenn Rasmussen    MR#:  [id number]  DATE OF BIRTH:  May 08, 1991  SUBJECTIVE:  CHIEF COMPLAINT:   Chief Complaint  Patient presents with  . Drug Overdose   -Patient extubated just this morning.  Alert and following simple commands but still oriented to self -Remains on 3 pressors for hypotension, on CRRT  REVIEW OF SYSTEMS:  Review of Systems  Unable to perform ROS: Critical illness    DRUG ALLERGIES:  No Known Allergies  VITALS:  Blood pressure (!) 117/48, pulse 85, temperature (!) 97.5 F (36.4 C), resp. rate 10, height 5' 7.01" (1.702 m), weight 108.9 kg, SpO2 92 %.  PHYSICAL EXAMINATION:  Physical Exam   GENERAL:  28 y.o.-year-old critically ill-appearing patient lying in the bed with no acute distress.  EYES: Pupils equal, round, reactive to light and accommodation. No scleral icterus. Extraocular muscles intact.  HEENT: Head atraumatic, normocephalic. Oropharynx and nasopharynx clear.  NECK:  Supple, no jugular venous distention. No thyroid enlargement, no tenderness.  LUNGS: Normal breath sounds bilaterally, no wheezing, rales,rhonchi or crepitation. No use of accessory muscles of respiration.  Decreased bibasilar breath sounds CARDIOVASCULAR: S1, S2 normal. No murmurs, rubs, or gallops.  ABDOMEN: Soft, nontender, nondistended. Bowel sounds present. No organomegaly or mass.  EXTREMITIES: No  cyanosis, or clubbing.  1+ pedal edema noted NEUROLOGIC: Patient is extubated, he is alert but remains disoriented.  Following simple commands and nodding head appropriately PSYCHIATRIC: The patient is alert and oriented to self SKIN: No obvious rash, lesion, or ulcer.    LABORATORY PANEL:   CBC Recent Labs  Lab 03/12/19 0146  WBC 23.8*  HGB 9.6*  HCT 28.1*  PLT 156   ------------------------------------------------------------------------------------------------------------------  Chemistries   Recent Labs  Lab 03/11/19 0453  03/12/19 0146  03/12/19 0752  NA 139   < > 136   < > 134*  K 5.6*   < > 3.7   < > 4.3  CL 104   < > 104   < > 101  CO2 23   < > 24   < > 23  GLUCOSE 137*   < > 80   < > 193*  BUN 31*   < > 32*   < > 31*  CREATININE 4.53*   < > 3.86*   < > 3.41*  CALCIUM 8.0*   < > 8.5*   < > 8.2*  MG 1.9  --  2.0  --   --   AST 72*  --   --   --   --   ALT 60*  --   --   --   --   ALKPHOS 54  --   --   --   --   BILITOT 1.0  --   --   --   --    < > = values in this interval not displayed.   ------------------------------------------------------------------------------------------------------------------  Cardiac Enzymes Recent Labs  Lab 03/08/19 2333  TROPONINI <0.03   ------------------------------------------------------------------------------------------------------------------  RADIOLOGY:  Dg Chest Port 1 View  Result Date: 03/11/2019 CLINICAL DATA:  Acute respiratory failure EXAM: PORTABLE CHEST 1 VIEW COMPARISON:  03/10/2019 FINDINGS: Cardiac shadow is stable. Endotracheal tube, gastric catheter and left jugular central line are again seen and stable. Increasing vascular congestion is noted when compare with the prior exam with enlarging left pleural effusion. No focal confluent infiltrate is seen. IMPRESSION: Worsening vascular congestion with left-sided pleural effusion.  Tubes and lines as described. Electronically Signed   By: Inez Catalina M.D.   On: 03/11/2019 07:07   Dg Chest Port 1 View  Result Date: 03/10/2019 CLINICAL DATA:  Hypoxia.  Patient on a ventilator. EXAM: PORTABLE CHEST 1 VIEW COMPARISON:  Earlier film, same date. FINDINGS: The endotracheal tube is in good position, 5 cm above the carina. The NG tube is coursing down the esophagus and into the stomach. The left IJ central venous catheter is stable. The lungs demonstrate improved aeration compared to the earlier film. Less edema and atelectasis. No pneumothorax. IMPRESSION: 1. Support  apparatus in good position without complicating features. 2. Improved lung aeration since earlier chest film. Electronically Signed   By: Marijo Sanes M.D.   On: 03/10/2019 20:35    EKG:   Orders placed or performed during the hospital encounter of 03/08/19  . ED EKG  . ED EKG  . ED EKG  . ED EKG  . EKG 12-Lead  . EKG 12-Lead  . EKG 12-Lead  . EKG 12-Lead  . EKG 12-Lead  . EKG 12-Lead  . EKG 12-Lead  . EKG 12-Lead  . EKG 12-Lead  . EKG 12-Lead  . EKG 12-Lead  . EKG 12-Lead    ASSESSMENT AND PLAN:   28 year old male with past medical history significant for hypertension, IBS admitted secondary to overdose on Phenergan, losartan, Norvasc and Klonopin  1.  Acute respiratory failure-intubated for airway protection secondary to acute metabolic encephalopathy -Patient is extubated on 03/12/2019, management per ICU team -Off sedation.  Continue to monitor closely  2.  Intentional drug overdose-patient overdosed on 38 tablets of Phenergan, 19 tablets of losartan, 42 tablets of Norvasc and 78 tablets of Klonopin -Urine tox positive for benzos and tricyclics -Poison control has been contacted, management per ICU team at this time.  - Extremely hypotensive-remains on pressors-Levophed, vasopressin and Neo-Synephrine -Per their recommendations, he is on insulin drip because of intake of calcium channel blocker.   -Will need psych consult once more alert  3.  Acute renal failure-likely ATN from hypotension.  -Started on CRRT, metabolic acidosis.  Appreciate nephrology consult -Continue to monitor urine output  4.   Pneumonia-follow-up procalcitonin and WBC.  Currently on Rocephin  5.  DVT prophylaxis-on subcutaneous heparin  Overall poor prognosis at this time Will need psych consult   All the records are reviewed and case discussed with Care Management/Social Workerr. Management plans discussed with the patient, family and they are in agreement.  CODE STATUS: Full code   TOTAL TIME TAKING CARE OF THIS PATIENT: 37 minutes.   POSSIBLE D/C IN ? DAYS, DEPENDING ON CLINICAL CONDITION.   Gladstone Lighter M.D on 03/12/2019 at 12:59 PM  Between 7am to 6pm - Pager - [phone]  After 6pm go to [email or web address] - password [facility]  Office  [phone]  CC: Primary care physician; Maeola Sarah, MD

## 2019-03-12 NOTE — Consult Note (Signed)
Glenn Rasmussen   Reason for Rasmussen: Suicide attempt by overdose Referring Physician: ICU provider Patient Identification: Glenn Rasmussen MRN:  [id number] Principal Diagnosis: Multiple drug overdose Diagnosis:  Principal Problem:   Multiple drug overdose Active Problems:   Suicide attempt ([place])   HTN (hypertension)   AKI (acute kidney injury) ([place])   Acute respiratory failure ([place])   Antihypertensive agent overdose   Calcium channel blocker overdose  Patient is seen, chart is reviewed.  Patient has provided permission to speak with mother, [name] [phone]), who is at bedside today Total Time spent with patient: 25 minutes  Subjective: "I am happy to be alive."  HPI:  Glenn Rasmussen is a 28 y.o. male patient admitted with drug overdose and suicide attempt.  Patient presents to the ED reporting multiple drug overdose.  He states that he is depressed and "just does not care about life anymore".  He states that he took a number of medications tonight including benzodiazepine, calcium channel blocker, Phenergan, amlodipine, and "just did not care what happened".  He is currently hypotensive, though awake and alert.  Treatment for his overdose was initiated in the ED and hospitalist were called for admission.Marland Kitchen  Psychiatry Rasmussen is requested for evaluation and further management  Past Psychiatric History: Per medication review, patient appears to be treated for ADHD, anxiety, and insomnia.  On evaluation, patient has recently been extubated.  His mother is at his bedside.  He is coordinating a video phone conference with his brother.  Patient is smiling, and expressing gratitude to have survived.  He endorses that he did have a suicide attempt by drug overdose.  Patient is currently denying suicidal ideation, plan or intent.  He denies HI.  He denies AVH.  Discussed with patient and mother plan for patient to medically stabilize while in the ICU, and then  transferred to inpatient psychiatry.  Patient is agreeable to this plan.  Risk to Self:  Yes Risk to Others:  Not known Prior Inpatient Therapy:  Not known Prior Outpatient Therapy:  Not known  Past Medical History:  Past Medical History:  Diagnosis Date  . Hypertension   . IBS (irritable bowel syndrome)     Past Surgical History:  Procedure Laterality Date  . APPENDECTOMY    . CHOLECYSTECTOMY    . TONSILLECTOMY     Family History:  Family History  Problem Relation Age of Onset  . Lung cancer Mother   . Lung cancer Father    Family Psychiatric  History: Unknown  Social History:  Social History   Substance and Sexual Activity  Alcohol Use No  . Frequency: Never     Social History   Substance and Sexual Activity  Drug Use No    Social History   Socioeconomic History  . Marital status: Single    Spouse name: Not on file  . Number of children: Not on file  . Years of education: Not on file  . Highest education level: Not on file  Occupational History  . Not on file  Social Needs  . Financial resource strain: Not on file  . Food insecurity:    Worry: Not on file    Inability: Not on file  . Transportation needs:    Medical: Not on file    Non-medical: Not on file  Tobacco Use  . Smoking status: Never Smoker  . Smokeless tobacco: Never Used  Substance and Sexual Activity  . Alcohol use: No    Frequency:  Never  . Drug use: No  . Sexual activity: Not on file  Lifestyle  . Physical activity:    Days per week: Not on file    Minutes per session: Not on file  . Stress: Not on file  Relationships  . Social connections:    Talks on phone: Not on file    Gets together: Not on file    Attends religious service: Not on file    Active member of club or organization: Not on file    Attends meetings of clubs or organizations: Not on file    Relationship status: Not on file  Other Topics Concern  . Not on file  Social History Narrative  . Not on file    Additional Social History:    Unknown  Allergies:  No Known Allergies  Labs:  Results for orders placed or performed during the hospital encounter of 03/08/19 (from the past 48 hour(s))  Renal function     Status: Abnormal   Collection Time: 03/10/19  6:46 PM  Result Value Ref Range   Sodium 136 135 - 145 mmol/L   Potassium 6.8 (HH) 3.5 - 5.1 mmol/L    Comment: CRITICAL RESULT CALLED TO, READ BACK BY AND VERIFIED WITH RENEE BABB 03/10/19 @ 1913  MLK    Chloride 109 98 - 111 mmol/L   CO2 20 (L) 22 - 32 mmol/L   Glucose, Bld 89 70 - 99 mg/dL   BUN 27 (H) 6 - 20 mg/dL   Creatinine, Ser 4.42 (H) 0.61 - 1.24 mg/dL   Calcium 7.3 (L) 8.9 - 10.3 mg/dL   Phosphorus 3.6 2.5 - 4.6 mg/dL   Albumin 2.8 (L) 3.5 - 5.0 g/dL   GFR calc non Af Amer 17 (L) >60 mL/min   GFR calc Af Amer 19 (L) >60 mL/min   Anion gap 7 5 - 15    Comment: Performed at [facility], [address] [place], [place] [address]  Renal function panel     Status: Abnormal   Collection Time: 03/10/19  7:39 PM  Result Value Ref Range   Sodium 135 135 - 145 mmol/L   Potassium 6.7 (HH) 3.5 - 5.1 mmol/L    Comment: CRITICAL RESULT CALLED TO, READ BACK BY AND VERIFIED WITH RENEE BABB 03/10/19 @ 2016  MLK    Chloride 107 98 - 111 mmol/L   CO2 21 (L) 22 - 32 mmol/L   Glucose, Bld 96 70 - 99 mg/dL   BUN 27 (H) 6 - 20 mg/dL   Creatinine, Ser 4.56 (H) 0.61 - 1.24 mg/dL   Calcium 7.2 (L) 8.9 - 10.3 mg/dL   Phosphorus 3.8 2.5 - 4.6 mg/dL   Albumin 2.9 (L) 3.5 - 5.0 g/dL   GFR calc non Af Amer 16 (L) >60 mL/min   GFR calc Af Amer 19 (L) >60 mL/min   Anion gap 7 5 - 15    Comment: Performed at [facility], [address] [place], [place] [address]  Glucose, capillary     Status: None   Collection Time: 03/10/19  7:44 PM  Result Value Ref Range   Glucose-Capillary 74 70 - 99 mg/dL  Blood gas, arterial     Status: Abnormal   Collection Time: 03/10/19  8:10 PM  Result Value Ref Range   FIO2 1.00     Delivery systems VENTILATOR    Mode PRESSURE REGULATED VOLUME CONTROL    VT 500 mL   LHR 24 resp/min  Peep/cpap 15.0 cm H20   pH, Arterial 7.21 (L) 7.350 - 7.450   pCO2 arterial 55 (H) 32.0 - 48.0 mmHg   pO2, Arterial 55 (L) 83.0 - 108.0 mmHg   Bicarbonate 22.0 20.0 - 28.0 mmol/L   Acid-base deficit 6.4 (H) 0.0 - 2.0 mmol/L   O2 Saturation 80.5 %   Patient temperature 37.0    Sample type ARTERIAL DRAW     Comment: Performed at [facility], [address] [place], [place] [address]  Blood gas, arterial     Status: Abnormal   Collection Time: 03/10/19  8:14 PM  Result Value Ref Range   FIO2 1.00    Delivery systems VENTILATOR    pH, Arterial 7.26 (L) 7.350 - 7.450   pCO2 arterial 49 (H) 32.0 - 48.0 mmHg   pO2, Arterial 235 (H) 83.0 - 108.0 mmHg   Bicarbonate 22.5 20.0 - 28.0 mmol/L   Acid-base deficit 4.7 (H) 0.0 - 2.0 mmol/L   O2 Saturation 99.8 %   Patient temperature 37.0     Comment: CORRECTED ON 06/03 AT 0505: PREVIOUSLY REPORTED AS 37.5   Collection site A-LINE    Sample type ARTERIAL DRAW     Comment: Performed at [facility], [address] [place], [place] [address]  Renal function panel     Status: Abnormal   Collection Time: 03/10/19  8:39 PM  Result Value Ref Range   Sodium 136 135 - 145 mmol/L   Potassium 6.8 (HH) 3.5 - 5.1 mmol/L    Comment: CRITICAL RESULT CALLED TO, READ BACK BY AND VERIFIED WITH RENEE BABB 03/10/19 @ 2105  MLK    Chloride 108 98 - 111 mmol/L   CO2 21 (L) 22 - 32 mmol/L   Glucose, Bld 101 (H) 70 - 99 mg/dL   BUN 27 (H) 6 - 20 mg/dL   Creatinine, Ser 4.55 (H) 0.61 - 1.24 mg/dL   Calcium 7.5 (L) 8.9 - 10.3 mg/dL   Phosphorus 4.0 2.5 - 4.6 mg/dL   Albumin 2.9 (L) 3.5 - 5.0 g/dL   GFR calc non Af Amer 16 (L) >60 mL/min   GFR calc Af Amer 19 (L) >60 mL/min   Anion gap 7 5 - 15    Comment: Performed at [facility], [address] [place], [place] [address]  Renal function panel     Status: Abnormal    Collection Time: 03/10/19  9:46 PM  Result Value Ref Range   Sodium 136 135 - 145 mmol/L   Potassium 7.0 (HH) 3.5 - 5.1 mmol/L    Comment: CRITICAL RESULT CALLED TO, READ BACK BY AND VERIFIED WITH BETH BUONO ON 03/10/2019 AT 2228 QSD    Chloride 108 98 - 111 mmol/L   CO2 21 (L) 22 - 32 mmol/L   Glucose, Bld 108 (H) 70 - 99 mg/dL   BUN 28 (H) 6 - 20 mg/dL   Creatinine, Ser 4.48 (H) 0.61 - 1.24 mg/dL   Calcium 7.5 (L) 8.9 - 10.3 mg/dL   Phosphorus 4.3 2.5 - 4.6 mg/dL   Albumin 3.0 (L) 3.5 - 5.0 g/dL   GFR calc non Af Amer 16 (L) >60 mL/min   GFR calc Af Amer 19 (L) >60 mL/min   Anion gap 7 5 - 15    Comment: Performed at [facility], [address], [place], [place] [address]  Renal function panel     Status: Abnormal   Collection Time: 03/10/19 10:44 PM  Result Value  Ref Range   Sodium 136 135 - 145 mmol/L   Potassium 7.0 (HH) 3.5 - 5.1 mmol/L    Comment: CRITICAL RESULT CALLED TO, READ BACK BY AND VERIFIED WITH BETH BUONO ON 03/10/2019 AT 2335 QSD    Chloride 107 98 - 111 mmol/L   CO2 21 (L) 22 - 32 mmol/L   Glucose, Bld 111 (H) 70 - 99 mg/dL   BUN 28 (H) 6 - 20 mg/dL   Creatinine, Ser 4.46 (H) 0.61 - 1.24 mg/dL   Calcium 7.5 (L) 8.9 - 10.3 mg/dL   Phosphorus 4.2 2.5 - 4.6 mg/dL   Albumin 2.9 (L) 3.5 - 5.0 g/dL   GFR calc non Af Amer 16 (L) >60 mL/min   GFR calc Af Amer 19 (L) >60 mL/min   Anion gap 8 5 - 15    Comment: Performed at [facility], [address] [place], [place] [address]  CK     Status: Abnormal   Collection Time: 03/10/19 10:44 PM  Result Value Ref Range   Total CK 2,001 (H) 49 - 397 U/L    Comment: Performed at [facility], [address] [place], [place] [address]  CBC     Status: Abnormal   Collection Time: 03/10/19 10:57 PM  Result Value Ref Range   WBC 28.5 (H) 4.0 - 10.5 K/uL   RBC 4.20 (L) 4.22 - 5.81 MIL/uL   Hemoglobin 11.4 (L) 13.0 - 17.0 g/dL   HCT 34.0 (L) 39.0 - 52.0 %   MCV 81.0 80.0 - 100.0 fL   MCH  27.1 26.0 - 34.0 pg   MCHC 33.5 30.0 - 36.0 g/dL   RDW 13.2 11.5 - 15.5 %   Platelets 168 150 - 400 K/uL   nRBC 0.0 0.0 - 0.2 %    Comment: Performed at [facility], [address] [place], [place] [address]  Renal function panel     Status: Abnormal   Collection Time: 03/10/19 11:35 PM  Result Value Ref Range   Sodium 137 135 - 145 mmol/L   Potassium 6.7 (HH) 3.5 - 5.1 mmol/L    Comment: CRITICAL RESULT CALLED TO, READ BACK BY AND VERIFIED WITH BETH BUONO ON 03/11/2019 AT 0006 QSD    Chloride 107 98 - 111 mmol/L   CO2 22 22 - 32 mmol/L   Glucose, Bld 114 (H) 70 - 99 mg/dL   BUN 29 (H) 6 - 20 mg/dL   Creatinine, Ser 4.58 (H) 0.61 - 1.24 mg/dL   Calcium 7.5 (L) 8.9 - 10.3 mg/dL   Phosphorus 4.4 2.5 - 4.6 mg/dL   Albumin 2.9 (L) 3.5 - 5.0 g/dL   GFR calc non Af Amer 16 (L) >60 mL/min   GFR calc Af Amer 19 (L) >60 mL/min   Anion gap 8 5 - 15    Comment: Performed at [facility], [address] [place], [place] [address]  Haptoglobin     Status: None   Collection Time: 03/10/19 11:36 PM  Result Value Ref Range   Haptoglobin 191 17 - 317 mg/dL    Comment: (NOTE) Performed At: [facility] [place], [place] [id number] Rush Farmer MD [id number]   Lactate dehydrogenase     Status: Abnormal   Collection Time: 03/10/19 11:36 PM  Result Value Ref Range   LDH 300 (H) 98 - 192 U/L    Comment: Performed at [facility], [address] [place], [place] [address]  Glucose, capillary  Status: Abnormal   Collection Time: 03/10/19 11:55 PM  Result Value Ref Range   Glucose-Capillary 102 (H) 70 - 99 mg/dL  Renal function panel     Status: Abnormal   Collection Time: 03/11/19  1:15 AM  Result Value Ref Range   Sodium 138 135 - 145 mmol/L   Potassium 5.1 3.5 - 5.1 mmol/L   Chloride 106 98 - 111 mmol/L   CO2 22 22 - 32 mmol/L   Glucose, Bld 146 (H) 70 - 99 mg/dL   BUN 30 (H) 6 - 20 mg/dL   Creatinine, Ser 4.55 (H) 0.61 -  1.24 mg/dL   Calcium 7.6 (L) 8.9 - 10.3 mg/dL   Phosphorus 5.2 (H) 2.5 - 4.6 mg/dL   Albumin 2.9 (L) 3.5 - 5.0 g/dL   GFR calc non Af Amer 16 (L) >60 mL/min   GFR calc Af Amer 19 (L) >60 mL/min   Anion gap 10 5 - 15    Comment: Performed at [facility], [address] [place], [place] [address]  Renal function panel     Status: Abnormal   Collection Time: 03/11/19  2:55 AM  Result Value Ref Range   Sodium 139 135 - 145 mmol/L   Potassium 5.6 (H) 3.5 - 5.1 mmol/L   Chloride 105 98 - 111 mmol/L   CO2 23 22 - 32 mmol/L   Glucose, Bld 138 (H) 70 - 99 mg/dL   BUN 31 (H) 6 - 20 mg/dL   Creatinine, Ser 4.44 (H) 0.61 - 1.24 mg/dL   Calcium 7.9 (L) 8.9 - 10.3 mg/dL   Phosphorus 5.9 (H) 2.5 - 4.6 mg/dL   Albumin 3.0 (L) 3.5 - 5.0 g/dL   GFR calc non Af Amer 17 (L) >60 mL/min   GFR calc Af Amer 19 (L) >60 mL/min   Anion gap 11 5 - 15    Comment: Performed at [facility], [address] [place], [place] [address]  Blood gas, arterial     Status: Abnormal   Collection Time: 03/11/19  4:00 AM  Result Value Ref Range   FIO2 1.00    Delivery systems VENTILATOR    Mode PRESSURE REGULATED VOLUME CONTROL    VT 500 mL   Peep/cpap 15.0 cm H20   pH, Arterial 7.26 (L) 7.350 - 7.450   pCO2 arterial 49 (H) 32.0 - 48.0 mmHg   pO2, Arterial 235 (H) 83.0 - 108.0 mmHg   Bicarbonate 22.5 20.0 - 28.0 mmol/L   Acid-base deficit 4.7 (H) 0.0 - 2.0 mmol/L   O2 Saturation 99.8 %   Patient temperature 37.5    Collection site A-LINE    Sample type ARTERIAL DRAW    Mechanical Rate 24     Comment: Performed at [facility], [address] [place], [place] [address]  Glucose, capillary     Status: Abnormal   Collection Time: 03/11/19  4:03 AM  Result Value Ref Range   Glucose-Capillary 130 (H) 70 - 99 mg/dL  Urine Culture     Status: None   Collection Time: 03/11/19  4:33 AM  Result Value Ref Range   Specimen Description      URINE, RANDOM Performed at [facility], [address] [place], [place] [address]    Special Requests      NONE Performed at [facility], [address], [place], [place] [address]    Culture      NO GROWTH Performed at [facility], [address] [place], [place] [address]    Report Status 03/12/2019 FINAL   Renal function panel     Status: Abnormal   Collection Time: 03/11/19  4:51 AM  Result Value Ref Range   Sodium 138 135 - 145 mmol/L   Potassium 5.9 (H) 3.5 - 5.1 mmol/L   Chloride 104 98 - 111 mmol/L   CO2 23 22 - 32 mmol/L   Glucose, Bld 145 (H) 70 - 99 mg/dL   BUN 32 (H) 6 - 20 mg/dL   Creatinine, Ser 4.58 (H) 0.61 - 1.24 mg/dL   Calcium 7.9 (L) 8.9 - 10.3 mg/dL   Phosphorus 5.6 (H) 2.5 - 4.6 mg/dL   Albumin 2.9 (L) 3.5 - 5.0 g/dL   GFR calc non Af Amer 16 (L) >60 mL/min   GFR calc Af Amer 19 (L) >60 mL/min   Anion gap 11 5 - 15    Comment: Performed at [facility], [address] [place], [place] [address]  Procalcitonin     Status: None   Collection Time: 03/11/19  4:53 AM  Result Value Ref Range   Procalcitonin <0.10 ng/mL    Comment:        Interpretation: PCT (Procalcitonin) <= 0.5 ng/mL: Systemic infection (sepsis) is not likely. Local bacterial infection is possible. (NOTE)       Sepsis PCT Algorithm           Lower Respiratory Tract                                      Infection PCT Algorithm    ----------------------------     ----------------------------         PCT < 0.25 ng/mL                PCT < 0.10 ng/mL         Strongly encourage             Strongly discourage   discontinuation of antibiotics    initiation of antibiotics    ----------------------------     -----------------------------       PCT 0.25 - 0.50 ng/mL            PCT 0.10 - 0.25 ng/mL               OR       >80% decrease in PCT            Discourage initiation of                                            antibiotics      Encourage discontinuation           of antibiotics     ----------------------------     -----------------------------         PCT >= 0.50 ng/mL              PCT 0.26 - 0.50 ng/mL               AND        <80% decrease in PCT             Encourage initiation of  antibiotics       Encourage continuation           of antibiotics    ----------------------------     -----------------------------        PCT >= 0.50 ng/mL                  PCT > 0.50 ng/mL               AND         increase in PCT                  Strongly encourage                                      initiation of antibiotics    Strongly encourage escalation           of antibiotics                                     -----------------------------                                           PCT <= 0.25 ng/mL                                                 OR                                        > 80% decrease in PCT                                     Discontinue / Do not initiate                                             antibiotics Performed at [facility], [address] [place], [place] [address]   Magnesium     Status: None   Collection Time: 03/11/19  4:53 AM  Result Value Ref Range   Magnesium 1.9 1.7 - 2.4 mg/dL    Comment: Performed at [facility], [address] [place], [place] [address]  CBC     Status: Abnormal   Collection Time: 03/11/19  4:53 AM  Result Value Ref Range   WBC 24.7 (H) 4.0 - 10.5 K/uL   RBC 3.99 (L) 4.22 - 5.81 MIL/uL   Hemoglobin 10.8 (L) 13.0 - 17.0 g/dL   HCT 32.8 (L) 39.0 - 52.0 %   MCV 82.2 80.0 - 100.0 fL   MCH 27.1 26.0 - 34.0 pg   MCHC 32.9 30.0 - 36.0 g/dL   RDW 13.2 11.5 - 15.5 %   Platelets 124 (L) 150 - 400 K/uL   nRBC 0.1 0.0 - 0.2 %    Comment: Performed at [facility],  [place], [place] [address]  Comprehensive metabolic panel     Status: Abnormal   Collection Time: 03/11/19  4:53 AM  Result Value Ref Range   Sodium 139 135 -  145 mmol/L   Potassium 5.6 (H) 3.5 - 5.1 mmol/L   Chloride 104 98 - 111 mmol/L   CO2 23 22 - 32 mmol/L   Glucose, Bld 137 (H) 70 - 99 mg/dL   BUN 31 (H) 6 - 20 mg/dL   Creatinine, Ser 4.53 (H) 0.61 - 1.24 mg/dL   Calcium 8.0 (L) 8.9 - 10.3 mg/dL   Total Protein 5.7 (L) 6.5 - 8.1 g/dL   Albumin 2.9 (L) 3.5 - 5.0 g/dL   AST 72 (H) 15 - 41 U/L   ALT 60 (H) 0 - 44 U/L   Alkaline Phosphatase 54 38 - 126 U/L   Total Bilirubin 1.0 0.3 - 1.2 mg/dL   GFR calc non Af Amer 16 (L) >60 mL/min   GFR calc Af Amer 19 (L) >60 mL/min   Anion gap 12 5 - 15    Comment: Performed at [facility], [address] [place], [place] [address]  Glucose, capillary     Status: Abnormal   Collection Time: 03/11/19  7:27 AM  Result Value Ref Range   Glucose-Capillary 136 (H) 70 - 99 mg/dL  Renal function panel     Status: Abnormal   Collection Time: 03/11/19  7:51 AM  Result Value Ref Range   Sodium 138 135 - 145 mmol/L   Potassium 5.5 (H) 3.5 - 5.1 mmol/L   Chloride 105 98 - 111 mmol/L   CO2 22 22 - 32 mmol/L   Glucose, Bld 163 (H) 70 - 99 mg/dL   BUN 33 (H) 6 - 20 mg/dL   Creatinine, Ser 4.79 (H) 0.61 - 1.24 mg/dL   Calcium 7.8 (L) 8.9 - 10.3 mg/dL   Phosphorus 6.7 (H) 2.5 - 4.6 mg/dL   Albumin 2.9 (L) 3.5 - 5.0 g/dL   GFR calc non Af Amer 15 (L) >60 mL/min   GFR calc Af Amer 18 (L) >60 mL/min   Anion gap 11 5 - 15    Comment: Performed at [facility], [address] [place], [place] [address]  Glucose, capillary     Status: Abnormal   Collection Time: 03/11/19 11:41 AM  Result Value Ref Range   Glucose-Capillary 146 (H) 70 - 99 mg/dL  Renal function panel     Status: Abnormal   Collection Time: 03/11/19 11:54 AM  Result Value Ref Range   Sodium 136 135 - 145 mmol/L   Potassium 4.9 3.5 - 5.1 mmol/L   Chloride 103 98 - 111 mmol/L   CO2 19 (L) 22 - 32 mmol/L   Glucose, Bld 177 (H) 70 - 99 mg/dL   BUN 37 (H) 6 - 20 mg/dL   Creatinine, Ser 4.52 (H) 0.61 - 1.24 mg/dL    Calcium 7.7 (L) 8.9 - 10.3 mg/dL   Phosphorus 7.0 (H) 2.5 - 4.6 mg/dL   Albumin 3.1 (L) 3.5 - 5.0 g/dL   GFR calc non Af Amer 16 (L) >60 mL/min   GFR calc Af Amer 19 (L) >60 mL/min   Anion gap 14 5 - 15    Comment: Performed at [facility], [address] [place], [place] [address]  Glucose, capillary     Status: Abnormal   Collection Time: 03/11/19 12:08 PM  Result Value Ref Range  Glucose-Capillary 159 (H) 70 - 99 mg/dL  Glucose, capillary     Status: Abnormal   Collection Time: 03/11/19  1:16 PM  Result Value Ref Range   Glucose-Capillary 136 (H) 70 - 99 mg/dL  Glucose, capillary     Status: Abnormal   Collection Time: 03/11/19  2:00 PM  Result Value Ref Range   Glucose-Capillary 126 (H) 70 - 99 mg/dL  Glucose, capillary     Status: Abnormal   Collection Time: 03/11/19  3:12 PM  Result Value Ref Range   Glucose-Capillary 110 (H) 70 - 99 mg/dL  Renal function panel     Status: Abnormal   Collection Time: 03/11/19  3:20 PM  Result Value Ref Range   Sodium 137 135 - 145 mmol/L   Potassium 4.1 3.5 - 5.1 mmol/L   Chloride 105 98 - 111 mmol/L   CO2 20 (L) 22 - 32 mmol/L   Glucose, Bld 130 (H) 70 - 99 mg/dL   BUN 34 (H) 6 - 20 mg/dL   Creatinine, Ser 4.30 (H) 0.61 - 1.24 mg/dL   Calcium 8.1 (L) 8.9 - 10.3 mg/dL   Phosphorus 6.3 (H) 2.5 - 4.6 mg/dL   Albumin 3.1 (L) 3.5 - 5.0 g/dL   GFR calc non Af Amer 17 (L) >60 mL/min   GFR calc Af Amer 20 (L) >60 mL/min   Anion gap 12 5 - 15    Comment: Performed at [facility], [address] [place], [place] [address]  Glucose, capillary     Status: Abnormal   Collection Time: 03/11/19  4:13 PM  Result Value Ref Range   Glucose-Capillary 105 (H) 70 - 99 mg/dL  Glucose, capillary     Status: None   Collection Time: 03/11/19  5:15 PM  Result Value Ref Range   Glucose-Capillary 86 70 - 99 mg/dL  Glucose, capillary     Status: None   Collection Time: 03/11/19  6:14 PM  Result Value Ref Range   Glucose-Capillary  97 70 - 99 mg/dL  Glucose, capillary     Status: None   Collection Time: 03/11/19  7:14 PM  Result Value Ref Range   Glucose-Capillary 98 70 - 99 mg/dL  Glucose, capillary     Status: None   Collection Time: 03/11/19  8:47 PM  Result Value Ref Range   Glucose-Capillary 97 70 - 99 mg/dL  Renal function panel     Status: Abnormal   Collection Time: 03/11/19  8:56 PM  Result Value Ref Range   Sodium 137 135 - 145 mmol/L   Potassium 3.7 3.5 - 5.1 mmol/L   Chloride 103 98 - 111 mmol/L   CO2 23 22 - 32 mmol/L   Glucose, Bld 105 (H) 70 - 99 mg/dL   BUN 32 (H) 6 - 20 mg/dL   Creatinine, Ser 3.97 (H) 0.61 - 1.24 mg/dL   Calcium 8.4 (L) 8.9 - 10.3 mg/dL   Phosphorus 5.4 (H) 2.5 - 4.6 mg/dL   Albumin 3.1 (L) 3.5 - 5.0 g/dL   GFR calc non Af Amer 19 (L) >60 mL/min   GFR calc Af Amer 22 (L) >60 mL/min   Anion gap 11 5 - 15    Comment: Performed at [facility], [address] [place], [place] [address]  Glucose, capillary     Status: None   Collection Time: 03/11/19 10:01 PM  Result Value Ref Range   Glucose-Capillary 84 70 - 99 mg/dL  Glucose, capillary  Status: None   Collection Time: 03/11/19 11:22 PM  Result Value Ref Range   Glucose-Capillary 83 70 - 99 mg/dL  Basic metabolic panel     Status: Abnormal   Collection Time: 03/11/19 11:30 PM  Result Value Ref Range   Sodium 136 135 - 145 mmol/L   Potassium 3.6 3.5 - 5.1 mmol/L   Chloride 103 98 - 111 mmol/L   CO2 24 22 - 32 mmol/L   Glucose, Bld 96 70 - 99 mg/dL   BUN 34 (H) 6 - 20 mg/dL   Creatinine, Ser 4.08 (H) 0.61 - 1.24 mg/dL   Calcium 8.3 (L) 8.9 - 10.3 mg/dL   GFR calc non Af Amer 18 (L) >60 mL/min   GFR calc Af Amer 21 (L) >60 mL/min   Anion gap 9 5 - 15    Comment: Performed at [facility], [address] [place], [place] [address]  Glucose, capillary     Status: Abnormal   Collection Time: 03/12/19 12:46 AM  Result Value Ref Range   Glucose-Capillary 69 (L) 70 - 99 mg/dL   Comment 1 QC  Due   Renal function panel     Status: Abnormal   Collection Time: 03/12/19 12:48 AM  Result Value Ref Range   Sodium 136 135 - 145 mmol/L   Potassium 3.8 3.5 - 5.1 mmol/L   Chloride 104 98 - 111 mmol/L   CO2 24 22 - 32 mmol/L   Glucose, Bld 82 70 - 99 mg/dL   BUN 32 (H) 6 - 20 mg/dL   Creatinine, Ser 3.85 (H) 0.61 - 1.24 mg/dL   Calcium 8.5 (L) 8.9 - 10.3 mg/dL   Phosphorus 5.5 (H) 2.5 - 4.6 mg/dL   Albumin 2.8 (L) 3.5 - 5.0 g/dL   GFR calc non Af Amer 20 (L) >60 mL/min   GFR calc Af Amer 23 (L) >60 mL/min   Anion gap 8 5 - 15    Comment: Performed at [facility], [address] [place], [place] [address]  Glucose, capillary     Status: None   Collection Time: 03/12/19  1:09 AM  Result Value Ref Range   Glucose-Capillary 73 70 - 99 mg/dL  Glucose, capillary     Status: None   Collection Time: 03/12/19  1:15 AM  Result Value Ref Range   Glucose-Capillary 76 70 - 99 mg/dL  Magnesium     Status: None   Collection Time: 03/12/19  1:46 AM  Result Value Ref Range   Magnesium 2.0 1.7 - 2.4 mg/dL    Comment: Performed at [facility], [address] [place], [place] [address]  CBC     Status: Abnormal   Collection Time: 03/12/19  1:46 AM  Result Value Ref Range   WBC 23.8 (H) 4.0 - 10.5 K/uL   RBC 3.54 (L) 4.22 - 5.81 MIL/uL   Hemoglobin 9.6 (L) 13.0 - 17.0 g/dL   HCT 28.1 (L) 39.0 - 52.0 %   MCV 79.4 (L) 80.0 - 100.0 fL   MCH 27.1 26.0 - 34.0 pg   MCHC 34.2 30.0 - 36.0 g/dL   RDW 12.9 11.5 - 15.5 %   Platelets 156 150 - 400 K/uL   nRBC 0.1 0.0 - 0.2 %    Comment: Performed at [facility], [address] [place], [place] [address]  Basic metabolic panel     Status: Abnormal   Collection Time: 03/12/19  1:46 AM  Result Value Ref Range  Sodium 136 135 - 145 mmol/L   Potassium 3.7 3.5 - 5.1 mmol/L   Chloride 104 98 - 111 mmol/L   CO2 24 22 - 32 mmol/L   Glucose, Bld 80 70 - 99 mg/dL   BUN 32 (H) 6 - 20 mg/dL   Creatinine, Ser 3.86 (H)  0.61 - 1.24 mg/dL   Calcium 8.5 (L) 8.9 - 10.3 mg/dL   GFR calc non Af Amer 20 (L) >60 mL/min   GFR calc Af Amer 23 (L) >60 mL/min   Anion gap 8 5 - 15    Comment: Performed at [facility], [address] [place], [place] [address]  Glucose, capillary     Status: None   Collection Time: 03/12/19  2:33 AM  Result Value Ref Range   Glucose-Capillary 81 70 - 99 mg/dL  Basic metabolic panel     Status: Abnormal   Collection Time: 03/12/19  2:36 AM  Result Value Ref Range   Sodium 135 135 - 145 mmol/L   Potassium 3.6 3.5 - 5.1 mmol/L   Chloride 103 98 - 111 mmol/L   CO2 23 22 - 32 mmol/L   Glucose, Bld 80 70 - 99 mg/dL   BUN 31 (H) 6 - 20 mg/dL   Creatinine, Ser 3.62 (H) 0.61 - 1.24 mg/dL   Calcium 8.5 (L) 8.9 - 10.3 mg/dL   GFR calc non Af Amer 21 (L) >60 mL/min   GFR calc Af Amer 25 (L) >60 mL/min   Anion gap 9 5 - 15    Comment: Performed at [facility], [address] [place], [place] [address]  Glucose, capillary     Status: None   Collection Time: 03/12/19  3:33 AM  Result Value Ref Range   Glucose-Capillary 75 70 - 99 mg/dL  Basic metabolic panel     Status: Abnormal   Collection Time: 03/12/19  3:36 AM  Result Value Ref Range   Sodium 138 135 - 145 mmol/L   Potassium 3.8 3.5 - 5.1 mmol/L   Chloride 105 98 - 111 mmol/L   CO2 24 22 - 32 mmol/L   Glucose, Bld 77 70 - 99 mg/dL   BUN 31 (H) 6 - 20 mg/dL   Creatinine, Ser 3.61 (H) 0.61 - 1.24 mg/dL   Calcium 8.4 (L) 8.9 - 10.3 mg/dL   GFR calc non Af Amer 21 (L) >60 mL/min   GFR calc Af Amer 25 (L) >60 mL/min   Anion gap 9 5 - 15    Comment: Performed at [facility], [address] [place], [place] [address]  Renal function panel     Status: Abnormal   Collection Time: 03/12/19  5:28 AM  Result Value Ref Range   Sodium 136 135 - 145 mmol/L   Potassium 3.6 3.5 - 5.1 mmol/L   Chloride 104 98 - 111 mmol/L   CO2 24 22 - 32 mmol/L   Glucose, Bld 68 (L) 70 - 99 mg/dL   BUN 32 (H) 6 - 20  mg/dL   Creatinine, Ser 3.64 (H) 0.61 - 1.24 mg/dL   Calcium 8.2 (L) 8.9 - 10.3 mg/dL   Phosphorus 4.4 2.5 - 4.6 mg/dL   Albumin 2.9 (L) 3.5 - 5.0 g/dL   GFR calc non Af Amer 21 (L) >60 mL/min   GFR calc Af Amer 24 (L) >60 mL/min   Anion gap 8 5 - 15    Comment: Performed at [facility], [place],  [place], [place] [address]  Glucose, capillary     Status: Abnormal   Collection Time: 03/12/19  5:38 AM  Result Value Ref Range   Glucose-Capillary 58 (L) 70 - 99 mg/dL  Glucose, capillary     Status: Abnormal   Collection Time: 03/12/19  5:39 AM  Result Value Ref Range   Glucose-Capillary 65 (L) 70 - 99 mg/dL  Glucose, capillary     Status: Abnormal   Collection Time: 03/12/19  6:12 AM  Result Value Ref Range   Glucose-Capillary 115 (H) 70 - 99 mg/dL  Renal function panel     Status: Abnormal   Collection Time: 03/12/19  7:52 AM  Result Value Ref Range   Sodium 134 (L) 135 - 145 mmol/L   Potassium 4.3 3.5 - 5.1 mmol/L   Chloride 101 98 - 111 mmol/L   CO2 23 22 - 32 mmol/L   Glucose, Bld 193 (H) 70 - 99 mg/dL   BUN 31 (H) 6 - 20 mg/dL   Creatinine, Ser 3.41 (H) 0.61 - 1.24 mg/dL   Calcium 8.2 (L) 8.9 - 10.3 mg/dL   Phosphorus 4.6 2.5 - 4.6 mg/dL   Albumin 2.7 (L) 3.5 - 5.0 g/dL   GFR calc non Af Amer 23 (L) >60 mL/min   GFR calc Af Amer 26 (L) >60 mL/min   Anion gap 10 5 - 15    Comment: Performed at [facility], [address] [place], [place] [address]  Glucose, capillary     Status: Abnormal   Collection Time: 03/12/19  7:58 AM  Result Value Ref Range   Glucose-Capillary 168 (H) 70 - 99 mg/dL  Glucose, capillary     Status: Abnormal   Collection Time: 03/12/19  8:56 AM  Result Value Ref Range   Glucose-Capillary 200 (H) 70 - 99 mg/dL  Glucose, capillary     Status: Abnormal   Collection Time: 03/12/19 10:08 AM  Result Value Ref Range   Glucose-Capillary 199 (H) 70 - 99 mg/dL  Glucose, capillary     Status: Abnormal   Collection Time:  03/12/19 11:11 AM  Result Value Ref Range   Glucose-Capillary 178 (H) 70 - 99 mg/dL  Glucose, capillary     Status: Abnormal   Collection Time: 03/12/19 12:12 PM  Result Value Ref Range   Glucose-Capillary 178 (H) 70 - 99 mg/dL  Renal function panel     Status: Abnormal   Collection Time: 03/12/19 12:51 PM  Result Value Ref Range   Sodium 133 (L) 135 - 145 mmol/L   Potassium 4.4 3.5 - 5.1 mmol/L   Chloride 98 98 - 111 mmol/L   CO2 21 (L) 22 - 32 mmol/L   Glucose, Bld 200 (H) 70 - 99 mg/dL   BUN 34 (H) 6 - 20 mg/dL   Creatinine, Ser 3.41 (H) 0.61 - 1.24 mg/dL   Calcium 8.1 (L) 8.9 - 10.3 mg/dL   Phosphorus 4.4 2.5 - 4.6 mg/dL   Albumin 3.0 (L) 3.5 - 5.0 g/dL   GFR calc non Af Amer 23 (L) >60 mL/min   GFR calc Af Amer 26 (L) >60 mL/min   Anion gap 14 5 - 15    Comment: Performed at [facility], [address] [place], [place] [address]  Renal function panel     Status: Abnormal   Collection Time: 03/12/19  5:04 PM  Result Value Ref Range   Sodium 135 135 - 145 mmol/L   Potassium 4.3 3.5 - 5.1 mmol/L  Chloride 102 98 - 111 mmol/L   CO2 23 22 - 32 mmol/L   Glucose, Bld 182 (H) 70 - 99 mg/dL   BUN 38 (H) 6 - 20 mg/dL   Creatinine, Ser 3.73 (H) 0.61 - 1.24 mg/dL   Calcium 8.2 (L) 8.9 - 10.3 mg/dL   Phosphorus 5.2 (H) 2.5 - 4.6 mg/dL   Albumin 2.9 (L) 3.5 - 5.0 g/dL   GFR calc non Af Amer 20 (L) >60 mL/min   GFR calc Af Amer 24 (L) >60 mL/min   Anion gap 10 5 - 15    Comment: Performed at [facility], [address] [place], [place] [address]  Glucose, capillary     Status: Abnormal   Collection Time: 03/12/19  5:14 PM  Result Value Ref Range   Glucose-Capillary 178 (H) 70 - 99 mg/dL    Current Facility-Administered Medications  Medication Dose Route Frequency Provider Last Rate Last Dose  . albumin human 25 % solution 12.5 g  12.5 g Intravenous BID Murlean Iba, MD   Stopped at 03/12/19 1028  . bisacodyl (DULCOLAX) suppository 10 mg  10 mg Rectal  Daily PRN Tukov-Yual, Magdalene S, NP      . cefTRIAXone (ROCEPHIN) 2 g in sodium chloride 0.9 % 100 mL IVPB  2 g Intravenous Q24H Flora Lipps, MD 200 mL/hr at 03/12/19 1703 2 g at 03/12/19 1703  . chlorhexidine gluconate (MEDLINE KIT) (PERIDEX) 0.12 % solution 15 mL  15 mL Mouth Rinse BID Tukov-Yual, Magdalene S, NP   15 mL at 03/12/19 0757  . Chlorhexidine Gluconate Cloth 2 % PADS 6 each  6 each Topical Q0600 Lance Coon, MD   6 each at 03/12/19 0457  . heparin injection 1,000-6,000 Units  1,000-6,000 Units CRRT PRN Murlean Iba, MD      . heparin injection 5,000 Units  5,000 Units Subcutaneous Q8H Flora Lipps, MD   5,000 Units at 03/12/19 1346  . hydrocortisone sodium succinate (SOLU-CORTEF) 100 MG injection 50 mg  50 mg Intravenous Q6H Flora Lipps, MD   50 mg at 03/12/19 1700  . ipratropium-albuterol (DUONEB) 0.5-2.5 (3) MG/3ML nebulizer solution 3 mL  3 mL Nebulization Q6H Tukov-Yual, Magdalene S, NP   3 mL at 03/12/19 1200  . MEDLINE mouth rinse  15 mL Mouth Rinse 10 times per day Tukov-Yual, Magdalene S, NP   15 mL at 03/12/19 1711  . midodrine (PROAMATINE) tablet 5 mg  5 mg Oral TID WC Flora Lipps, MD      . norepinephrine (LEVOPHED) 16 mg in 214m premix infusion  0-75 mcg/min Intravenous Titrated KFlora Lipps MD 46.9 mL/hr at 03/12/19 1553 50 mcg/min at 03/12/19 1553  . pantoprazole (PROTONIX) injection 40 mg  40 mg Intravenous Q12H AOttie Glazier MD   40 mg at 03/12/19 0916  . phenylephrine (NEO-SYNEPHRINE) 40 mg in sodium chloride 0.9 % 250 mL (0.16 mg/mL) infusion  0-400 mcg/min Intravenous Titrated KGladstone Lighter MD   Stopped at 03/12/19 1341  . pureflow IV solution for Dialysis   CRRT Continuous KDarel HongD, NP 2,500 mL/hr at 03/12/19 1119    . sennosides (SENOKOT) 8.8 MG/5ML syrup 5 mL  5 mL Per Tube BID PRN Tukov-Yual, Magdalene S, NP      . sodium chloride flush (NS) 0.9 % injection 10-40 mL  10-40 mL Intracatheter Q12H Tukov-Yual, Magdalene S, NP   10 mL at  03/12/19 0919  . sodium chloride flush (NS) 0.9 % injection 10-40 mL  10-40 mL Intracatheter PRN  Tukov-Yual, Arlyss Gandy, NP      . vasopressin (PITRESSIN) 40 Units in sodium chloride 0.9 % 250 mL (0.16 Units/mL) infusion  0.03 Units/min Intravenous Continuous Charlett Nose, RPH 11.25 mL/hr at 03/12/19 1553 0.03 Units/min at 03/12/19 1553    Musculoskeletal: Strength & Muscle Tone: decreased Gait & Station: Not assessed Patient leans: N/A  Psychiatric Specialty Exam: Physical Exam  Nursing note and vitals reviewed. Constitutional: He is oriented to person, place, and time. He appears well-developed and well-nourished.  HENT:  Head: Normocephalic and atraumatic.  Eyes: EOM are normal.  Neck: Normal range of motion.  Cardiovascular: Regular rhythm.  Respiratory: Effort normal. No respiratory distress.  With respiratory pressure support  Musculoskeletal: Normal range of motion.  Neurological: He is alert and oriented to person, place, and time.    Review of Systems  Respiratory: Positive for shortness of breath.   Neurological: Positive for weakness.  Psychiatric/Behavioral: Positive for depression and suicidal ideas. Negative for hallucinations and substance abuse. The patient is nervous/anxious. The patient does not have insomnia.   All other systems reviewed and are negative.   Blood pressure (!) 105/47, pulse 89, temperature 98 F (36.7 C), resp. rate 12, height 5' 7.01" (1.702 m), weight 108.9 kg, SpO2 91 %.Body mass index is 37.59 kg/m.  General Appearance: Casual  Eye Contact:  Fair  Speech:  Clear and Coherent  Volume:  Decreased  Mood:  Anxious  Affect:  Congruent  Thought Process:  Coherent  Orientation:  Full (Time, Place, and Person)  Thought Content:  Hallucinations: None  Suicidal Thoughts:  Denies today, however admission for intentional drug overdose as suicide attempt.  Homicidal Thoughts:  No  Memory:  Fair  Judgement:  Poor  Insight:  Present and  Shallow  Psychomotor Activity:  Decreased  Concentration:  Concentration: Fair  Recall:  [employer] of Knowledge:  Fair  Language:  Good  Akathisia:  No  Handed:  Right  AIMS (if indicated):     Assets:  Social Support patient has given consent to talk to mother, [name] [phone]  ADL's:  Impaired  Cognition:  Impaired,  Mild  Sleep:   Adequate    Treatment Plan Summary: Daily contact with patient to assess and evaluate symptoms and progress in treatment and Medication management  Hold psychiatric medication at this time until patient more medically stable.  Disposition: Recommend psychiatric Inpatient admission when medically cleared. Supportive therapy provided about ongoing stressors.  patient has given consent to talk to mother, [name] [phone]  Psychiatry will continue to follow and advise medication management as indicated.  Lavella Hammock, MD 03/12/2019 6:05 PM

## 2019-03-12 NOTE — Progress Notes (Signed)
Megan- ICU CNA will be sitting with patient 1 on 1 for suicide precautions.

## 2019-03-12 NOTE — Progress Notes (Signed)
Patient stated that he was having chest discomfort "throbbing" going down to his left arm. Notified Dr Mortimer Fries. Orders for EKG and troponin. Once completed EKG. Patient asked again if he was having chest pain or discomfort. Patient stated that "its gone away". Dr Mortimer Fries did give orders for morphine 2 mg IV if needed. Awaiting for troponin levels. Hinton Dyer NP reviewed EKG. No new orders given at this time.

## 2019-03-12 NOTE — Progress Notes (Signed)
CRRT alarming high pressure. Clots noted. No blood return noted in both ports. Activase orders placed. Will dwell for 2 hours.

## 2019-03-13 ENCOUNTER — Inpatient Hospital Stay: Payer: BC Managed Care – PPO

## 2019-03-13 ENCOUNTER — Other Ambulatory Visit: Payer: BLUE CROSS/BLUE SHIELD

## 2019-03-13 DIAGNOSIS — F332 Major depressive disorder, recurrent severe without psychotic features: Secondary | ICD-10-CM

## 2019-03-13 LAB — RENAL FUNCTION PANEL
Albumin: 3 g/dL — ABNORMAL LOW (ref 3.5–5.0)
Albumin: 3.1 g/dL — ABNORMAL LOW (ref 3.5–5.0)
Albumin: 3.2 g/dL — ABNORMAL LOW (ref 3.5–5.0)
Albumin: 3.2 g/dL — ABNORMAL LOW (ref 3.5–5.0)
Albumin: 3.3 g/dL — ABNORMAL LOW (ref 3.5–5.0)
Albumin: 3.4 g/dL — ABNORMAL LOW (ref 3.5–5.0)
Anion gap: 11 (ref 5–15)
Anion gap: 11 (ref 5–15)
Anion gap: 12 (ref 5–15)
Anion gap: 13 (ref 5–15)
Anion gap: 13 (ref 5–15)
Anion gap: 13 (ref 5–15)
BUN: 40 mg/dL — ABNORMAL HIGH (ref 6–20)
BUN: 43 mg/dL — ABNORMAL HIGH (ref 6–20)
BUN: 43 mg/dL — ABNORMAL HIGH (ref 6–20)
BUN: 44 mg/dL — ABNORMAL HIGH (ref 6–20)
BUN: 45 mg/dL — ABNORMAL HIGH (ref 6–20)
BUN: 46 mg/dL — ABNORMAL HIGH (ref 6–20)
CO2: 20 mmol/L — ABNORMAL LOW (ref 22–32)
CO2: 21 mmol/L — ABNORMAL LOW (ref 22–32)
CO2: 21 mmol/L — ABNORMAL LOW (ref 22–32)
CO2: 21 mmol/L — ABNORMAL LOW (ref 22–32)
CO2: 21 mmol/L — ABNORMAL LOW (ref 22–32)
CO2: 21 mmol/L — ABNORMAL LOW (ref 22–32)
Calcium: 8 mg/dL — ABNORMAL LOW (ref 8.9–10.3)
Calcium: 8 mg/dL — ABNORMAL LOW (ref 8.9–10.3)
Calcium: 8 mg/dL — ABNORMAL LOW (ref 8.9–10.3)
Calcium: 8 mg/dL — ABNORMAL LOW (ref 8.9–10.3)
Calcium: 8 mg/dL — ABNORMAL LOW (ref 8.9–10.3)
Calcium: 8.4 mg/dL — ABNORMAL LOW (ref 8.9–10.3)
Chloride: 102 mmol/L (ref 98–111)
Chloride: 103 mmol/L (ref 98–111)
Chloride: 104 mmol/L (ref 98–111)
Chloride: 104 mmol/L (ref 98–111)
Chloride: 105 mmol/L (ref 98–111)
Chloride: 105 mmol/L (ref 98–111)
Creatinine, Ser: 3.29 mg/dL — ABNORMAL HIGH (ref 0.61–1.24)
Creatinine, Ser: 3.32 mg/dL — ABNORMAL HIGH (ref 0.61–1.24)
Creatinine, Ser: 3.48 mg/dL — ABNORMAL HIGH (ref 0.61–1.24)
Creatinine, Ser: 3.56 mg/dL — ABNORMAL HIGH (ref 0.61–1.24)
Creatinine, Ser: 3.63 mg/dL — ABNORMAL HIGH (ref 0.61–1.24)
Creatinine, Ser: 3.83 mg/dL — ABNORMAL HIGH (ref 0.61–1.24)
GFR calc Af Amer: 23 mL/min — ABNORMAL LOW (ref >60)
GFR calc Af Amer: 25 mL/min — ABNORMAL LOW (ref >60)
GFR calc Af Amer: 25 mL/min — ABNORMAL LOW (ref >60)
GFR calc Af Amer: 26 mL/min — ABNORMAL LOW (ref >60)
GFR calc Af Amer: 27 mL/min — ABNORMAL LOW (ref >60)
GFR calc Af Amer: 28 mL/min — ABNORMAL LOW (ref >60)
GFR calc non Af Amer: 20 mL/min — ABNORMAL LOW (ref >60)
GFR calc non Af Amer: 21 mL/min — ABNORMAL LOW (ref >60)
GFR calc non Af Amer: 22 mL/min — ABNORMAL LOW (ref >60)
GFR calc non Af Amer: 22 mL/min — ABNORMAL LOW (ref >60)
GFR calc non Af Amer: 24 mL/min — ABNORMAL LOW (ref >60)
GFR calc non Af Amer: 24 mL/min — ABNORMAL LOW (ref >60)
Glucose, Bld: 110 mg/dL — ABNORMAL HIGH (ref 70–99)
Glucose, Bld: 124 mg/dL — ABNORMAL HIGH (ref 70–99)
Glucose, Bld: 126 mg/dL — ABNORMAL HIGH (ref 70–99)
Glucose, Bld: 134 mg/dL — ABNORMAL HIGH (ref 70–99)
Glucose, Bld: 159 mg/dL — ABNORMAL HIGH (ref 70–99)
Glucose, Bld: 168 mg/dL — ABNORMAL HIGH (ref 70–99)
Phosphorus: 3 mg/dL (ref 2.5–4.6)
Phosphorus: 3.4 mg/dL (ref 2.5–4.6)
Phosphorus: 3.4 mg/dL (ref 2.5–4.6)
Phosphorus: 3.6 mg/dL (ref 2.5–4.6)
Phosphorus: 4.2 mg/dL (ref 2.5–4.6)
Phosphorus: 4.2 mg/dL (ref 2.5–4.6)
Potassium: 3.3 mmol/L — ABNORMAL LOW (ref 3.5–5.1)
Potassium: 3.3 mmol/L — ABNORMAL LOW (ref 3.5–5.1)
Potassium: 3.4 mmol/L — ABNORMAL LOW (ref 3.5–5.1)
Potassium: 3.7 mmol/L (ref 3.5–5.1)
Potassium: 3.8 mmol/L (ref 3.5–5.1)
Potassium: 4 mmol/L (ref 3.5–5.1)
Sodium: 135 mmol/L (ref 135–145)
Sodium: 136 mmol/L (ref 135–145)
Sodium: 137 mmol/L (ref 135–145)
Sodium: 137 mmol/L (ref 135–145)
Sodium: 137 mmol/L (ref 135–145)
Sodium: 139 mmol/L (ref 135–145)

## 2019-03-13 LAB — CBC
HCT: 23.3 % — ABNORMAL LOW (ref 39.0–52.0)
Hemoglobin: 8.1 g/dL — ABNORMAL LOW (ref 13.0–17.0)
MCH: 27.3 pg (ref 26.0–34.0)
MCHC: 34.8 g/dL (ref 30.0–36.0)
MCV: 78.5 fL — ABNORMAL LOW (ref 80.0–100.0)
Platelets: 140 10*3/uL — ABNORMAL LOW (ref 150–400)
RBC: 2.97 MIL/uL — ABNORMAL LOW (ref 4.22–5.81)
RDW: 12.4 % (ref 11.5–15.5)
WBC: 17.1 10*3/uL — ABNORMAL HIGH (ref 4.0–10.5)
nRBC: 0 % (ref 0.0–0.2)

## 2019-03-13 LAB — TROPONIN I: Troponin I: 0.27 ng/mL (ref <0.03)

## 2019-03-13 LAB — LIPID PANEL
Cholesterol: 154 mg/dL (ref 0–200)
HDL: 32 mg/dL — ABNORMAL LOW (ref >40)
LDL Cholesterol: 70 mg/dL (ref 0–99)
Total CHOL/HDL Ratio: 4.8 RATIO
Triglycerides: 258 mg/dL — ABNORMAL HIGH (ref <150)
VLDL: 52 mg/dL — ABNORMAL HIGH (ref 0–40)

## 2019-03-13 LAB — GLUCOSE, CAPILLARY
Glucose-Capillary: 115 mg/dL — ABNORMAL HIGH (ref 70–99)
Glucose-Capillary: 120 mg/dL — ABNORMAL HIGH (ref 70–99)
Glucose-Capillary: 124 mg/dL — ABNORMAL HIGH (ref 70–99)
Glucose-Capillary: 148 mg/dL — ABNORMAL HIGH (ref 70–99)
Glucose-Capillary: 154 mg/dL — ABNORMAL HIGH (ref 70–99)
Glucose-Capillary: 155 mg/dL — ABNORMAL HIGH (ref 70–99)
Glucose-Capillary: 99 mg/dL (ref 70–99)

## 2019-03-13 LAB — BLOOD GAS, ARTERIAL
Acid-Base Excess: 0.8 mmol/L (ref 0.0–2.0)
Bicarbonate: 24.2 mmol/L (ref 20.0–28.0)
FIO2: 0.45
O2 Saturation: 94.1 %
Patient temperature: 37
pCO2 arterial: 34 mmHg (ref 32.0–48.0)
pH, Arterial: 7.46 — ABNORMAL HIGH (ref 7.350–7.450)
pO2, Arterial: 67 mmHg — ABNORMAL LOW (ref 83.0–108.0)

## 2019-03-13 LAB — MAGNESIUM: Magnesium: 2.3 mg/dL (ref 1.7–2.4)

## 2019-03-13 LAB — HEPARIN LEVEL (UNFRACTIONATED): Heparin Unfractionated: 0.28 IU/mL — ABNORMAL LOW (ref 0.30–0.70)

## 2019-03-13 MED ORDER — LORAZEPAM 2 MG/ML IJ SOLN
0.5000 mg | Freq: Once | INTRAMUSCULAR | Status: AC
  Administered 2019-03-13: 0.5 mg via INTRAVENOUS
  Filled 2019-03-13: qty 1

## 2019-03-13 MED ORDER — PUREFLOW DIALYSIS SOLUTION
INTRAVENOUS | Status: DC
  Administered 2019-03-13: 19:00:00 via INTRAVENOUS_CENTRAL

## 2019-03-13 MED ORDER — HEPARIN BOLUS VIA INFUSION
2000.0000 [IU] | Freq: Once | INTRAVENOUS | Status: AC
  Administered 2019-03-13: 09:00:00 2000 [IU] via INTRAVENOUS
  Filled 2019-03-13: qty 2000

## 2019-03-13 MED ORDER — HEPARIN BOLUS VIA INFUSION
1400.0000 [IU] | Freq: Once | INTRAVENOUS | Status: AC
  Administered 2019-03-13: 1400 [IU] via INTRAVENOUS
  Filled 2019-03-13: qty 1400

## 2019-03-13 MED ORDER — LORAZEPAM 2 MG/ML IJ SOLN
1.0000 mg | INTRAMUSCULAR | Status: DC | PRN
  Administered 2019-03-16: 1 mg via INTRAVENOUS
  Filled 2019-03-13: qty 1

## 2019-03-13 MED ORDER — TRAZODONE HCL 100 MG PO TABS
100.0000 mg | ORAL_TABLET | Freq: Every evening | ORAL | Status: DC | PRN
  Administered 2019-03-14 – 2019-03-16 (×4): 100 mg via ORAL
  Filled 2019-03-13 (×4): qty 1

## 2019-03-13 MED ORDER — INSULIN ASPART 100 UNIT/ML ~~LOC~~ SOLN
0.0000 [IU] | SUBCUTANEOUS | Status: DC
  Filled 2019-03-13: qty 1

## 2019-03-13 MED ORDER — HEPARIN (PORCINE) 25000 UT/250ML-% IV SOLN
1700.0000 [IU]/h | INTRAVENOUS | Status: DC
  Administered 2019-03-13: 1100 [IU]/h via INTRAVENOUS
  Administered 2019-03-14: 1300 [IU]/h via INTRAVENOUS
  Filled 2019-03-13 (×2): qty 250

## 2019-03-13 NOTE — Consult Note (Signed)
Pharmacy Electrolyte Monitoring Consult:  Pharmacy consulted to assist in monitoring and replacing electrolytes in this      Labs:   Potassium (mmol/L)  Date Value  03/13/2019 3.7   Magnesium (mg/dL)  Date Value  03/13/2019 2.3   Phosphorus (mg/dL)  Date Value  03/13/2019 3.6   Calcium (mg/dL)  Date Value  03/13/2019 8.0 (L)   Albumin (g/dL)  Date Value  03/13/2019 3.2 (L)   Corrected Ca: 8.4 mg/dL  Assessment/Plan: 28 year old male with a medical history as indicated below who presented to the ED after taking 38 tablets of Phenergan, 19 tablets of losartan, 42 tablets of amlodipine, and 78 tablets of clonazepam and noted electrolyte abnormalities.  Patient is currently extubated and receiving 2K CRRT bath. MIVF have been discontinued.   No further adjustments warranted at this time. Pharmacy will continue to follow along with scheduled renal function panels scheduled Q4hr.   Pharmacy will continue to monitor and adjust per consult.

## 2019-03-13 NOTE — Progress Notes (Signed)
[place] for heparin drip management  Indication: chest pain/ACS  No Known Allergies  Patient Measurements: Height: 5' 7.01" (170.2 cm) Weight: 240 lb 11.9 oz (109.2 kg) IBW/kg (Calculated) : 66.12 Heparin Dosing Weight: 92kg    Vital Signs: Temp: 98.9 F (37.2 C) (06/05 1200) Temp Source: Axillary (06/05 1200) BP: 125/80 (06/05 1700) Pulse Rate: 102 (06/05 1700)  Labs: Recent Labs    03/10/19 2244  03/11/19 0453  03/12/19 0146  03/12/19 1833  03/12/19 2302  03/13/19 0441 03/13/19 1155 03/13/19 1732  HGB  --    < > 10.8*  --  9.6*  --   --   --   --   --  8.1*  --   --   HCT  --    < > 32.8*  --  28.1*  --   --   --   --   --  23.3*  --   --   PLT  --    < > 124*  --  156  --   --   --   --   --  140*  --   --   HEPARINUNFRC  --   --   --   --   --   --   --   --   --   --   --   --  0.28*  CREATININE 4.46*   < > 4.53*   < > 3.86*   < >  --    < >  --    < > 3.63* 3.83* 3.56*  CKTOTAL 2,001*  --   --   --   --   --   --   --   --   --   --   --   --   TROPONINI  --   --   --   --   --   --  0.24*  --  0.29*  --  0.27*  --   --    < > = values in this interval not displayed.    Estimated Creatinine Clearance: 35.7 mL/min (A) (by C-G formula based on SCr of 3.56 mg/dL (H)).   Medical History: Past Medical History:  Diagnosis Date  . Hypertension   . IBS (irritable bowel syndrome)     Medications:  Scheduled:  . Chlorhexidine Gluconate Cloth  6 each Topical Q0600  . heparin  1,400 Units Intravenous Once  . hydrocortisone sod succinate (SOLU-CORTEF) inj  50 mg Intravenous Q6H  . insulin aspart  0-15 Units Subcutaneous Q4H  . ipratropium-albuterol  3 mL Nebulization Q6H  . midodrine  5 mg Oral TID WC  . pantoprazole (PROTONIX) IV  40 mg Intravenous Q12H  . sodium chloride flush  10-40 mL Intracatheter Q12H   Infusions:  . albumin human 12.5 g (03/13/19 1059)  . cefTRIAXone (ROCEPHIN)  IV 2 g (03/13/19 1839)  . heparin  1,100 Units/hr (03/13/19 0930)  . norepinephrine (LEVOPHED) Adult infusion 10 mcg/min (03/13/19 1800)  . pureflow 2,500 mL/hr at 03/13/19 1900    Assessment: Pharmacy consulted for heparin drip management for 24 28 year old male with a medical history as indicated below who presented to the ED after taking 38 tablets of Phenergan, 19 tablets of losartan, 42 tablets of amlodipine, and 78 tablets of clonazepam.   Patient with chest pain overnight and elevated tropinins. Patient previously ordered heparin 5000 units SQ Q8hr.   Goal of Therapy:  Heparin level 0.3-0.7 units/ml Monitor platelets by anticoagulation protocol: Yes   Plan:  Heparin level subtherapeutic. Verified with RN no interruptions in therapy. Heparin bolus 1400 units x 1 and increase rate to 1300 units/hr. Will obtain heparin level at 0330.   Pharmacy will continue to monitor and adjust per consult.   Glenn Rasmussen,Glenn Rasmussen 03/13/2019,7:37 PM

## 2019-03-13 NOTE — Progress Notes (Signed)
CRITICAL CARE NOTE  CC  follow up respiratory failure  SUBJECTIVE Patient remains critically ill Prognosis is guarded Severe hypoxic respiratory failure On high flow nasal cannula 90% FiO2 High risk for reintubation On CRRT Remains on multiple pressors   BP (!) 113/47   Pulse 88   Temp 98.3 F (36.8 C)   Resp 20   Ht 5' 7.01" (1.702 m)   Wt 109.2 kg   SpO2 93%   BMI 37.70 kg/m    I/O last 3 completed shifts: In: 5266.7 [I.V.:5105.5; IV Piggyback:161.2] Out: 6256 [Urine:55; Emesis/NG output:250; LSLHT:3428] No intake/output data recorded.  SpO2: 93 % O2 Flow Rate (L/min): 50 L/min FiO2 (%): 90 %   SIGNIFICANT EVENTS 5/31 admitted for severe shock and resp failure DRUG OD 6/1 started CRRT multiorgan failure, resp failure, vasopressors 6/2 severe hypoxia UF rates increased to 500 6/3 DNR, multiorgan failure 6/4 CODE STATUS changed to full code, high risk for reintubation severe hypoxia on CRRT  REVIEW OF SYSTEMS  PATIENT IS UNABLE TO PROVIDE COMPLETE REVIEW OF SYSTEMS DUE TO SEVERE CRITICAL ILLNESS   PHYSICAL EXAMINATION:  GENERAL:critically ill appearing, +resp distress HEAD: Normocephalic, atraumatic.  EYES: Pupils equal, round, reactive to light.  No scleral icterus.  MOUTH: Moist mucosal membrane. NECK: Supple. No thyromegaly. No nodules. No JVD.  PULMONARY: +rhonchi, +wheezing CARDIOVASCULAR: S1 and S2. Regular rate and rhythm. No murmurs, rubs, or gallops.  GASTROINTESTINAL: Soft, nontender, -distended. No masses. Positive bowel sounds. No hepatosplenomegaly.  MUSCULOSKELETAL: No swelling, clubbing, or edema.  NEUROLOGIC: Lethargic SKIN:intact,warm,dry  MEDICATIONS: I have reviewed all medications and confirmed regimen as documented   CULTURE RESULTS   Recent Results (from the past 240 hour(s))  SARS Coronavirus 2 (CEPHEID - Performed in [place] hospital lab), Hosp Order     Status: None   Collection Time: 03/08/19 11:48 PM  Result Value  Ref Range Status   SARS Coronavirus 2 NEGATIVE NEGATIVE Final    Comment: (NOTE) If result is NEGATIVE SARS-CoV-2 target nucleic acids are NOT DETECTED. The SARS-CoV-2 RNA is generally detectable in upper and lower  respiratory specimens during the acute phase of infection. The lowest  concentration of SARS-CoV-2 viral copies this assay can detect is 250  copies / mL. A negative result does not preclude SARS-CoV-2 infection  and should not be used as the sole basis for treatment or other  patient management decisions.  A negative result may occur with  improper specimen collection / handling, submission of specimen other  than nasopharyngeal swab, presence of viral mutation(s) within the  areas targeted by this assay, and inadequate number of viral copies  (<250 copies / mL). A negative result must be combined with clinical  observations, patient history, and epidemiological information. If result is POSITIVE SARS-CoV-2 target nucleic acids are DETECTED. The SARS-CoV-2 RNA is generally detectable in upper and lower  respiratory specimens dur ing the acute phase of infection.  Positive  results are indicative of active infection with SARS-CoV-2.  Clinical  correlation with patient history and other diagnostic information is  necessary to determine patient infection status.  Positive results do  not rule out bacterial infection or co-infection with other viruses. If result is PRESUMPTIVE POSTIVE SARS-CoV-2 nucleic acids MAY BE PRESENT.   A presumptive positive result was obtained on the submitted specimen  and confirmed on repeat testing.  While 2019 novel coronavirus  (SARS-CoV-2) nucleic acids may be present in the submitted sample  additional confirmatory testing may be necessary for epidemiological  and /  or clinical management purposes  to differentiate between  SARS-CoV-2 and other Sarbecovirus currently known to infect humans.  If clinically indicated additional testing with an  alternate test  methodology [phone]) is advised. The SARS-CoV-2 RNA is generally  detectable in upper and lower respiratory sp ecimens during the acute  phase of infection. The expected result is Negative. Fact Sheet for Patients:  StrictlyIdeas.no Fact Sheet for Healthcare Providers: BankingDealers.co.za This test is not yet approved or cleared by the [place] FDA and has been authorized for detection and/or diagnosis of SARS-CoV-2 by FDA under an Emergency Use Authorization (EUA).  This EUA will remain in effect (meaning this test can be used) for the duration of the COVID-19 declaration under Section 564(b)(1) of the Act, 21 U.S.C. section 360bbb-3(b)(1), unless the authorization is terminated or revoked sooner. Performed at [facility], [address] [place], [place] [address]   MRSA PCR Screening     Status: None   Collection Time: 03/09/19  2:48 AM  Result Value Ref Range Status   MRSA by PCR NEGATIVE NEGATIVE Final    Comment:        The GeneXpert MRSA Assay (FDA approved for NASAL specimens only), is one component of a comprehensive MRSA colonization surveillance program. It is not intended to diagnose MRSA infection nor to guide or monitor treatment for MRSA infections. Performed at [facility], [address] [place], [place] [address]   Culture, respiratory (non-expectorated)     Status: None   Collection Time: 03/09/19  8:00 AM  Result Value Ref Range Status   Specimen Description   Final    TRACHEAL ASPIRATE Performed at [facility], [address], [place], [place] [address]    Special Requests   Final    NONE Performed at [facility], [address] [place], [place] [address]    Gram Stain   Final    ABUNDANT WBC PRESENT,BOTH PMN AND MONONUCLEAR MODERATE GRAM POSITIVE COCCI RARE YEAST    Culture   Final    MODERATE GROUP B  STREP(S.AGALACTIAE)ISOLATED TESTING AGAINST S. AGALACTIAE NOT ROUTINELY PERFORMED DUE TO PREDICTABILITY OF AMP/PEN/VAN SUSCEPTIBILITY. Performed at [facility], [place] [address] [place], [place] [address]    Report Status 03/11/2019 FINAL  Final  CULTURE, BLOOD (ROUTINE X 2) w Reflex to ID Panel     Status: None (Preliminary result)   Collection Time: 03/09/19  2:53 PM  Result Value Ref Range Status   Specimen Description BLOOD BLOOD RIGHT ARM  Final   Special Requests   Final    BOTTLES DRAWN AEROBIC AND ANAEROBIC Blood Culture results may not be optimal due to an excessive volume of blood received in culture bottles   Culture  Setup Time PENDING  Incomplete   Culture   Final    NO GROWTH 4 DAYS Performed at [facility], [address] [place], [place] [address]    Report Status PENDING  Incomplete  CULTURE, BLOOD (ROUTINE X 2) w Reflex to ID Panel     Status: None (Preliminary result)   Collection Time: 03/09/19  5:00 PM  Result Value Ref Range Status   Specimen Description BLOOD RIGHT ARM  Final   Special Requests   Final    BOTTLES DRAWN AEROBIC AND ANAEROBIC Blood Culture results may not be optimal due to an excessive volume of blood received in culture bottles   Culture  Setup Time PENDING  Incomplete   Culture   Final  NO GROWTH 4 DAYS Performed at [facility], [address] [place], [place] [address]    Report Status PENDING  Incomplete  Urine Culture     Status: None   Collection Time: 03/11/19  4:33 AM  Result Value Ref Range Status   Specimen Description   Final    URINE, RANDOM Performed at [facility], [address] [place], [place] [address]    Special Requests   Final    NONE Performed at [facility], [address] [place], [place] [address]    Culture   Final    NO GROWTH Performed at [facility], [place] [address], [place], [place] [address]    Report Status 03/12/2019 FINAL  Final           IMAGING    Ct Head Wo Contrast  Result Date: 03/13/2019 CLINICAL DATA:  Drug overdose EXAM: CT HEAD WITHOUT CONTRAST TECHNIQUE: Contiguous axial images were obtained from the base of the skull through the vertex without intravenous contrast. COMPARISON:  Head CT 12/29/2006 FINDINGS: Brain: There is no mass, hemorrhage or extra-axial collection. The size and configuration of the ventricles and extra-axial CSF spaces are normal. The brain parenchyma is normal, without acute or chronic infarction. Vascular: No abnormal hyperdensity of the major intracranial arteries or dural venous sinuses. No intracranial atherosclerosis. Skull: The visualized skull base, calvarium and extracranial soft tissues are normal. Sinuses/Orbits: No fluid levels or advanced mucosal thickening of the visualized paranasal sinuses. No mastoid or middle ear effusion. The orbits are normal. IMPRESSION: Normal head CT. Electronically Signed   By: Ulyses Jarred M.D.   On: 03/13/2019 02:30   Dg Chest Port 1 View  Result Date: 03/13/2019 CLINICAL DATA:  Acute respiratory failure EXAM: PORTABLE CHEST 1 VIEW COMPARISON:  03/11/2019 FINDINGS: The left-sided catheter tip projects over the SVC. The heart size is stable from prior study. There is worsening vascular congestion in worsening bilateral airspace opacities. There are likely bilateral pleural effusions. No evidence of a pneumothorax. The patient has been extubated. Enteric tube has been removed. IMPRESSION: 1. Status post extubation. 2. Remaining lines and tubes as above. 3. Worsening vascular congestion. Persistent small bilateral pleural effusions. Electronically Signed   By: Constance Holster M.D.   On: 03/13/2019 03:46       Indwelling Urinary Catheter continued, requirement due to   Reason to continue Indwelling Urinary Catheter strict Intake/Output monitoring for hemodynamic instability   Central Line/ continued, requirement due to  Reason to continue [place] of central venous pressure or other hemodynamic parameters and poor IV access    CBC    Component Value Date/Time   WBC 17.1 (H) 03/13/2019 0441   RBC 2.97 (L) 03/13/2019 0441   HGB 8.1 (L) 03/13/2019 0441   HCT 23.3 (L) 03/13/2019 0441   PLT 140 (L) 03/13/2019 0441   MCV 78.5 (L) 03/13/2019 0441   MCH 27.3 03/13/2019 0441   MCHC 34.8 03/13/2019 0441   RDW 12.4 03/13/2019 0441   LYMPHSABS 2.5 03/10/2019 0431   MONOABS 3.0 (H) 03/10/2019 0431   EOSABS 0.0 03/10/2019 0431   BASOSABS 0.1 03/10/2019 0431   BMP Latest Ref Rng & Units 03/13/2019 03/13/2019 03/12/2019  Glucose 70 - 99 mg/dL 159(H) 168(H) 164(H)  BUN 6 - 20 mg/dL 43(H) 40(H) 40(H)  Creatinine 0.61 - 1.24 mg/dL 3.63(H) 3.48(H) 3.69(H)  Sodium 135 - 145 mmol/L 136 137 138  Potassium 3.5 - 5.1 mmol/L 3.8 4.0 4.3  Chloride 98 - 111 mmol/L 104 103 104  CO2 22 - 32 mmol/L 21(L) 21(L) 21(L)  Calcium 8.9 - 10.3 mg/dL 8.0(L) 8.0(L) 8.2(L)       ASSESSMENT AND PLAN SYNOPSIS  28 year old white male admitted to the ICU for acute drug overdose and toxicity  from calcium channel blocker in the setting of multiorgan failure with severehypoxicrespiratory failure,distributive shock and renal failure with metabolic encephalopathy    Severe ACUTE Hypoxic and Hypercapnic Respiratory Failure Patient was extubated several days ago however has severe hypoxia on high flow nasal cannula 90% Most likely related to pulmonary edema and aspiration pneumonitis and group B strep pneumonia Patient is a very high risk for reintubation    ACUTE KIDNEY INJURY/Renal Failure -follow chem 7 -follow UO -continue Foley Catheter-assess need -Avoid nephrotoxic agents -Recheck creatinine  -Continue CRRT   NEUROLOGY Remittent agitation and pain Follow-up psych evaluation and recommendations   SHOCK-SEPSIS/HYPOVOLUMIC/distributive -use vasopressors to keep MAP>65 -follow ABG and LA as needed -follow up cultures -consider stress  dose steroids -aggressive IV fluid resuscitation  CARDIAC ICU monitoring  ID -continue IV abx as prescibed -follow up cultures  GI GI PROPHYLAXIS as indicated  NUTRITIONAL STATUS DIET-->as tolerated Constipation protocol as indicated  ENDO - will use ICU hypoglycemic\Hyperglycemia protocol if indicated   ELECTROLYTES -follow labs as needed -replace as needed -pharmacy consultation and following   DVT/GI PRX ordered TRANSFUSIONS AS NEEDED MONITOR FSBS ASSESS the need for LABS as needed   Critical Care Time devoted to patient care services described in this note is 34 minutes.   Overall, patient is critically ill, prognosis is guarded.  Patient with Multiorgan failure and at high risk for cardiac arrest and death.    Corrin Parker, M.D.  [facility]  Medical Director [place] Director [facility] Cardio-Pulmonary Department

## 2019-03-13 NOTE — Progress Notes (Signed)
[place] for heparin drip management  Indication: chest pain/ACS  No Known Allergies  Patient Measurements: Height: 5' 7.01" (170.2 cm) Weight: 240 lb 11.9 oz (109.2 kg) IBW/kg (Calculated) : 66.12 Heparin Dosing Weight: 92kg    Vital Signs: Temp: 98.2 F (36.8 C) (06/05 0800) Temp Source: Oral (06/05 0800) BP: 125/80 (06/05 1700) Pulse Rate: 102 (06/05 1700)  Labs: Recent Labs    03/10/19 2244  03/11/19 0453  03/12/19 0146  03/12/19 1833  03/12/19 2302 03/13/19 0047 03/13/19 0441 03/13/19 1155  HGB  --    < > 10.8*  --  9.6*  --   --   --   --   --  8.1*  --   HCT  --    < > 32.8*  --  28.1*  --   --   --   --   --  23.3*  --   PLT  --    < > 124*  --  156  --   --   --   --   --  140*  --   CREATININE 4.46*   < > 4.53*   < > 3.86*   < >  --    < >  --  3.48* 3.63* 3.83*  CKTOTAL 2,001*  --   --   --   --   --   --   --   --   --   --   --   TROPONINI  --   --   --   --   --   --  0.24*  --  0.29*  --  0.27*  --    < > = values in this interval not displayed.    Estimated Creatinine Clearance: 33.2 mL/min (A) (by C-G formula based on SCr of 3.83 mg/dL (H)).   Medical History: Past Medical History:  Diagnosis Date  . Hypertension   . IBS (irritable bowel syndrome)     Medications:  Scheduled:  . Chlorhexidine Gluconate Cloth  6 each Topical Q0600  . hydrocortisone sod succinate (SOLU-CORTEF) inj  50 mg Intravenous Q6H  . insulin aspart  0-15 Units Subcutaneous Q4H  . ipratropium-albuterol  3 mL Nebulization Q6H  . midodrine  5 mg Oral TID WC  . pantoprazole (PROTONIX) IV  40 mg Intravenous Q12H  . sodium chloride flush  10-40 mL Intracatheter Q12H   Infusions:  . albumin human 12.5 g (03/13/19 1059)  . cefTRIAXone (ROCEPHIN)  IV Stopped (03/12/19 1737)  . heparin 1,100 Units/hr (03/13/19 0930)  . norepinephrine (LEVOPHED) Adult infusion 55 mcg/min (03/13/19 0534)  . pureflow 2,500 mL/hr at 03/13/19 0830     Assessment: Pharmacy consulted for heparin drip management for [age] year old male with a medical history as indicated below who presented to the ED after taking 38 tablets of Phenergan, 19 tablets of losartan, 42 tablets of amlodipine, and 78 tablets of clonazepam.   Patient with chest pain overnight and elevated tropinins. Patient previously ordered heparin 5000 units SQ Q8hr.   Goal of Therapy:  Heparin level 0.3-0.7 units/ml Monitor platelets by anticoagulation protocol: Yes   Plan:  Will initiate heparin bolus of 2000 units (patient received am dose of heparin) and start rate at 1100 units/hr. Will obtain anti-Xa level at 1730.   Pharmacy will continue to monitor and adjust per consult.   Kema Santaella L 03/13/2019,5:47 PM

## 2019-03-13 NOTE — Progress Notes (Signed)
[facility], [place] 03/13/19  Subjective:   Patient remains critically ill Continued on CRRT Requiring multiple pressors,  06/04 0701 - 06/05 0700 In: 1944.5 [I.V.:1783.3; IV Piggyback:161.2] Out: 1927 [Urine:10] Head CT neg last night   Objective:  Vital signs in last 24 hours:  Temp:  [98 F (36.7 C)-98.5 F (36.9 C)] 98.3 F (36.8 C) (06/05 0400) Pulse Rate:  [85-96] 88 (06/05 0600) Resp:  [8-29] 20 (06/05 0600) BP: (99-137)/(33-85) 113/47 (06/05 0600) SpO2:  [85 %-96 %] 93 % (06/05 0600) Arterial Line BP: (95-124)/(48-64) 108/55 (06/05 0600) FiO2 (%):  [65 %-90 %] 90 % (06/05 0242) Weight:  [109.2 kg] 109.2 kg (06/05 0449)  Weight change: 0.3 kg Filed Weights   03/11/19 0408 03/12/19 0500 03/13/19 0449  Weight: 107.7 kg 108.9 kg 109.2 kg    Intake/Output:    Intake/Output Summary (Last 24 hours) at 03/13/2019 0837 Last data filed at 03/13/2019 0600 Gross per 24 hour  Intake 1944.47 ml  Output 1830 ml  Net 114.47 ml   General :        critically ill Head:              Normocephalic, without obvious abnormality, atraumatic Eyes/ENT:      Dry oral mucus membranes Neck:              Supple,    Lungs:            Coarse,  HFNC O2 Heart:             Tachycardic, no rub or gallop Abdomen:      Soft, non-tender,   Extremities:   no cyanosis, + dependent edema Skin:               Skin color, texture, turgor normal, no rashes or lesions Neurologic:    alert, able to follow commands IJ vascath  Basic Metabolic Panel:  Recent Labs  Lab 03/09/19 2103  03/10/19 0431  03/11/19 0453  03/12/19 0146  03/12/19 1251 03/12/19 1704 03/12/19 2042 03/13/19 0047 03/13/19 0441  NA 132*   < >  --    < > 139   < > 136   < > 133* 135 138 137 136  K 5.2*   < >  --    < > 5.6*   < > 3.7   < > 4.4 4.3 4.3 4.0 3.8  CL 102   < >  --    < > 104   < > 104   < > 98 102 104 103 104  CO2 17*   < >  --    < > 23   < > 24   < > 21* 23 21* 21* 21*  GLUCOSE  543*   < >  --    < > 137*   < > 80   < > 200* 182* 164* 168* 159*  BUN 23*   < >  --    < > 31*   < > 32*   < > 34* 38* 40* 40* 43*  CREATININE 4.16*   < >  --    < > 4.53*   < > 3.86*   < > 3.41* 3.73* 3.69* 3.48* 3.63*  CALCIUM 6.9*   < >  --    < > 8.0*   < > 8.5*   < > 8.1* 8.2* 8.2* 8.0* 8.0*  MG 2.3  --  2.0  --  1.9  --  2.0  --   --   --   --   --  2.3  PHOS 5.8*  5.9*   < > 4.7*   < >  --    < >  --    < > 4.4 5.2* 4.7* 4.2 4.2   < > = values in this interval not displayed.     CBC: Recent Labs  Lab 03/08/19 2333  03/10/19 0431 03/10/19 2257 03/11/19 0453 03/12/19 0146 03/13/19 0441  WBC 14.8*   < > 30.4* 28.5* 24.7* 23.8* 17.1*  NEUTROABS 7.6  --  23.9*  --   --   --   --   HGB 14.3   < > 11.4* 11.4* 10.8* 9.6* 8.1*  HCT 41.8   < > 33.1* 34.0* 32.8* 28.1* 23.3*  MCV 78.9*   < > 79.0* 81.0 82.2 79.4* 78.5*  PLT 406*   < > 240 168 124* 156 140*   < > = values in this interval not displayed.     No results found for: HEPBSAG, HEPBSAB, HEPBIGM    Microbiology:  Recent Results (from the past 240 hour(s))  SARS Coronavirus 2 (CEPHEID - Performed in [facility] lab), Hosp Order     Status: None   Collection Time: 03/08/19 11:48 PM  Result Value Ref Range Status   SARS Coronavirus 2 NEGATIVE NEGATIVE Final    Comment: (NOTE) If result is NEGATIVE SARS-CoV-2 target nucleic acids are NOT DETECTED. The SARS-CoV-2 RNA is generally detectable in upper and lower  respiratory specimens during the acute phase of infection. The lowest  concentration of SARS-CoV-2 viral copies this assay can detect is 250  copies / mL. A negative result does not preclude SARS-CoV-2 infection  and should not be used as the sole basis for treatment or other  patient management decisions.  A negative result may occur with  improper specimen collection / handling, submission of specimen other  than nasopharyngeal swab, presence of viral mutation(s) within the  areas targeted by this  assay, and inadequate number of viral copies  (<250 copies / mL). A negative result must be combined with clinical  observations, patient history, and epidemiological information. If result is POSITIVE SARS-CoV-2 target nucleic acids are DETECTED. The SARS-CoV-2 RNA is generally detectable in upper and lower  respiratory specimens dur ing the acute phase of infection.  Positive  results are indicative of active infection with SARS-CoV-2.  Clinical  correlation with patient history and other diagnostic information is  necessary to determine patient infection status.  Positive results do  not rule out bacterial infection or co-infection with other viruses. If result is PRESUMPTIVE POSTIVE SARS-CoV-2 nucleic acids MAY BE PRESENT.   A presumptive positive result was obtained on the submitted specimen  and confirmed on repeat testing.  While 2019 novel coronavirus  (SARS-CoV-2) nucleic acids may be present in the submitted sample  additional confirmatory testing may be necessary for epidemiological  and / or clinical management purposes  to differentiate between  SARS-CoV-2 and other Sarbecovirus currently known to infect humans.  If clinically indicated additional testing with an alternate test  methodology [phone]) is advised. The SARS-CoV-2 RNA is generally  detectable in upper and lower respiratory sp ecimens during the acute  phase of infection. The expected result is Negative. Fact Sheet for Patients:  StrictlyIdeas.no Fact Sheet for Healthcare Providers: BankingDealers.co.za This test is not yet approved or cleared by the [place] FDA and has been authorized for detection and/or diagnosis  of SARS-CoV-2 by FDA under an Emergency Use Authorization (EUA).  This EUA will remain in effect (meaning this test can be used) for the duration of the COVID-19 declaration under Section 564(b)(1) of the Act, 21 U.S.C. section 360bbb-3(b)(1),  unless the authorization is terminated or revoked sooner. Performed at [facility], [address] [place], [place] [address]   MRSA PCR Screening     Status: None   Collection Time: 03/09/19  2:48 AM  Result Value Ref Range Status   MRSA by PCR NEGATIVE NEGATIVE Final    Comment:        The GeneXpert MRSA Assay (FDA approved for NASAL specimens only), is one component of a comprehensive MRSA colonization surveillance program. It is not intended to diagnose MRSA infection nor to guide or monitor treatment for MRSA infections. Performed at [facility], [address] [place], [place] [address]   Culture, respiratory (non-expectorated)     Status: None   Collection Time: 03/09/19  8:00 AM  Result Value Ref Range Status   Specimen Description   Final    TRACHEAL ASPIRATE Performed at [facility], [address], [place], [place] [address]    Special Requests   Final    NONE Performed at [facility], [address] [place], [place] [address]    Gram Stain   Final    ABUNDANT WBC PRESENT,BOTH PMN AND MONONUCLEAR MODERATE GRAM POSITIVE COCCI RARE YEAST    Culture   Final    MODERATE GROUP B STREP(S.AGALACTIAE)ISOLATED TESTING AGAINST S. AGALACTIAE NOT ROUTINELY PERFORMED DUE TO PREDICTABILITY OF AMP/PEN/VAN SUSCEPTIBILITY. Performed at [facility], [place] [address], [place], [place] [address]    Report Status 03/11/2019 FINAL  Final  CULTURE, BLOOD (ROUTINE X 2) w Reflex to ID Panel     Status: None (Preliminary result)   Collection Time: 03/09/19  2:53 PM  Result Value Ref Range Status   Specimen Description BLOOD BLOOD RIGHT ARM  Final   Special Requests   Final    BOTTLES DRAWN AEROBIC AND ANAEROBIC Blood Culture results may not be optimal due to an excessive volume of blood received in culture bottles   Culture  Setup Time PENDING  Incomplete   Culture   Final    NO GROWTH 4 DAYS Performed at [facility],  [address], [place], [place] [address]    Report Status PENDING  Incomplete  CULTURE, BLOOD (ROUTINE X 2) w Reflex to ID Panel     Status: None (Preliminary result)   Collection Time: 03/09/19  5:00 PM  Result Value Ref Range Status   Specimen Description BLOOD RIGHT ARM  Final   Special Requests   Final    BOTTLES DRAWN AEROBIC AND ANAEROBIC Blood Culture results may not be optimal due to an excessive volume of blood received in culture bottles   Culture  Setup Time PENDING  Incomplete   Culture   Final    NO GROWTH 4 DAYS Performed at [facility], [address], [place], [place] [address]    Report Status PENDING  Incomplete  Urine Culture     Status: None   Collection Time: 03/11/19  4:33 AM  Result Value Ref Range Status   Specimen Description   Final    URINE, RANDOM Performed at [facility], [address] [place],  [address]    Special Requests   Final    NONE Performed at [facility], [phone]  [address], [place], [place] [address]    Culture   Final    NO GROWTH Performed at [facility], [place] [address], [place], [place] [address]    Report Status 03/12/2019 FINAL  Final    Coagulation Studies: No results for input(s): LABPROT, INR in the last 72 hours.  Urinalysis: No results for input(s): COLORURINE, LABSPEC, PHURINE, GLUCOSEU, HGBUR, BILIRUBINUR, KETONESUR, PROTEINUR, UROBILINOGEN, NITRITE, LEUKOCYTESUR in the last 72 hours.  Invalid input(s): APPERANCEUR    Imaging: Ct Head Wo Contrast  Result Date: 03/13/2019 CLINICAL DATA:  Drug overdose EXAM: CT HEAD WITHOUT CONTRAST TECHNIQUE: Contiguous axial images were obtained from the base of the skull through the vertex without intravenous contrast. COMPARISON:  Head CT 12/29/2006 FINDINGS: Brain: There is no mass, hemorrhage or extra-axial collection. The size and configuration of the ventricles and extra-axial CSF spaces are normal. The brain parenchyma is normal,  without acute or chronic infarction. Vascular: No abnormal hyperdensity of the major intracranial arteries or dural venous sinuses. No intracranial atherosclerosis. Skull: The visualized skull base, calvarium and extracranial soft tissues are normal. Sinuses/Orbits: No fluid levels or advanced mucosal thickening of the visualized paranasal sinuses. No mastoid or middle ear effusion. The orbits are normal. IMPRESSION: Normal head CT. Electronically Signed   By: Ulyses Jarred M.D.   On: 03/13/2019 02:30   Dg Chest Port 1 View  Result Date: 03/13/2019 CLINICAL DATA:  Acute respiratory failure EXAM: PORTABLE CHEST 1 VIEW COMPARISON:  03/11/2019 FINDINGS: The left-sided catheter tip projects over the SVC. The heart size is stable from prior study. There is worsening vascular congestion in worsening bilateral airspace opacities. There are likely bilateral pleural effusions. No evidence of a pneumothorax. The patient has been extubated. Enteric tube has been removed. IMPRESSION: 1. Status post extubation. 2. Remaining lines and tubes as above. 3. Worsening vascular congestion. Persistent small bilateral pleural effusions. Electronically Signed   By: Constance Holster M.D.   On: 03/13/2019 03:46     Medications:   . albumin human Stopped (03/12/19 2200)  . cefTRIAXone (ROCEPHIN)  IV Stopped (03/12/19 1737)  . dexmedetomidine (PRECEDEX) IV infusion 1 mcg/kg/hr (03/13/19 0546)  . norepinephrine (LEVOPHED) Adult infusion 55 mcg/min (03/13/19 0534)  . pureflow 2,500 mL/hr at 03/12/19 2359  . vasopressin (PITRESSIN) infusion - *FOR SHOCK* 0.03 Units/min (03/12/19 2213)   . chlorhexidine gluconate (MEDLINE KIT)  15 mL Mouth Rinse BID  . Chlorhexidine Gluconate Cloth  6 each Topical Q0600  . heparin injection (subcutaneous)  5,000 Units Subcutaneous Q8H  . hydrocortisone sod succinate (SOLU-CORTEF) inj  50 mg Intravenous Q6H  . ipratropium-albuterol  3 mL Nebulization Q6H  . mouth rinse  15 mL Mouth Rinse BID   . midodrine  5 mg Oral TID WC  . pantoprazole (PROTONIX) IV  40 mg Intravenous Q12H  . sodium chloride flush  10-40 mL Intracatheter Q12H   bisacodyl, heparin, morphine injection, sennosides, sodium chloride flush  Assessment/ Plan:  28 y.o.caucasian male with HTN , was admitted on 03/08/2019 with ARF, acute resp failure secondary to drug overdose  1.  Oliguric acute renal failure with hyperkalemia and volume overload Minimal urine output, likely severe ATN Patient remains critically ill, severely hypotensive on multiple pressors CRRT started 6/1 Recommend to continue CRRT for metabolic and volume optimization  Continue 2 K bath  UF 150 cc/hr at present  continue low dose albumin support for oncotic support  2.  Acute respiratory failure Extubated 6/4 am Now requiring HFNC O2  3.  Metabolic acidosis -Improved with hemodialysis support  4. Drug overdose multiple tablets of Phenergan, losartan, amlodipine, clonidine IVC Sitter at bedside     LOS: [place] 6/5/20208:37 [place], [place]  Note: This note was prepared with Dragon dictation. Any transcription errors are unintentional

## 2019-03-13 NOTE — Progress Notes (Addendum)
[place] [place] NAME: Glenn Rasmussen    MR#:  [id number]  DATE OF BIRTH:  1990-12-29  SUBJECTIVE:  CHIEF COMPLAINT:   Chief Complaint  Patient presents with  . Drug Overdose   -Patient extubated yesterday.  Alert and following commands.  Oriented x1-2 -Remains on 2 pressors now.  Low-dose Precedex started.  REVIEW OF SYSTEMS:  Review of Systems  Unable to perform ROS: Critical illness    DRUG ALLERGIES:  No Known Allergies  VITALS:  Blood pressure (!) 113/47, pulse 88, temperature 98.3 F (36.8 C), resp. rate 20, height 5' 7.01" (1.702 m), weight 109.2 kg, SpO2 93 %.  PHYSICAL EXAMINATION:  Physical Exam   GENERAL:  28 y.o.-year-old critically ill-appearing patient lying in the bed with no acute distress.  EYES: Pupils equal, round, reactive to light and accommodation. No scleral icterus. Extraocular muscles intact.  HEENT: Head atraumatic, normocephalic. Oropharynx and nasopharynx clear.  NECK:  Supple, no jugular venous distention. No thyroid enlargement, no tenderness.  LUNGS: Normal breath sounds bilaterally, no wheezing, rales,rhonchi or crepitation. No use of accessory muscles of respiration.  Decreased bibasilar breath sounds CARDIOVASCULAR: S1, S2 normal. No murmurs, rubs, or gallops.  ABDOMEN: Soft, nontender, nondistended. Bowel sounds present. No organomegaly or mass.  EXTREMITIES: No  cyanosis, or clubbing.  1+ pedal edema noted NEUROLOGIC: Patient is extubated, he is alert but remains disoriented.  Following simple commands and nodding head appropriately PSYCHIATRIC: The patient is alert and oriented to self SKIN: No obvious rash, lesion, or ulcer.    LABORATORY PANEL:   CBC Recent Labs  Lab 03/13/19 0441  WBC 17.1*  HGB 8.1*  HCT 23.3*  PLT 140*   ------------------------------------------------------------------------------------------------------------------  Chemistries  Recent Labs  Lab  03/11/19 0453  03/13/19 0441 03/13/19 1155  NA 139   < > 136 137  K 5.6*   < > 3.8 3.7  CL 104   < > 104 104  CO2 23   < > 21* 21*  GLUCOSE 137*   < > 159* 134*  BUN 31*   < > 43* 46*  CREATININE 4.53*   < > 3.63* 3.83*  CALCIUM 8.0*   < > 8.0* 8.0*  MG 1.9   < > 2.3  --   AST 72*  --   --   --   ALT 60*  --   --   --   ALKPHOS 54  --   --   --   BILITOT 1.0  --   --   --    < > = values in this interval not displayed.   ------------------------------------------------------------------------------------------------------------------  Cardiac Enzymes Recent Labs  Lab 03/13/19 0441  TROPONINI 0.27*   ------------------------------------------------------------------------------------------------------------------  RADIOLOGY:  Ct Head Wo Contrast  Result Date: 03/13/2019 CLINICAL DATA:  Drug overdose EXAM: CT HEAD WITHOUT CONTRAST TECHNIQUE: Contiguous axial images were obtained from the base of the skull through the vertex without intravenous contrast. COMPARISON:  Head CT 12/29/2006 FINDINGS: Brain: There is no mass, hemorrhage or extra-axial collection. The size and configuration of the ventricles and extra-axial CSF spaces are normal. The brain parenchyma is normal, without acute or chronic infarction. Vascular: No abnormal hyperdensity of the major intracranial arteries or dural venous sinuses. No intracranial atherosclerosis. Skull: The visualized skull base, calvarium and extracranial soft tissues are normal. Sinuses/Orbits: No fluid levels or advanced mucosal thickening of the visualized paranasal sinuses. No mastoid or middle ear effusion. The orbits are  normal. IMPRESSION: Normal head CT. Electronically Signed   By: Ulyses Jarred M.D.   On: 03/13/2019 02:30   Dg Chest Port 1 View  Result Date: 03/13/2019 CLINICAL DATA:  Acute respiratory failure EXAM: PORTABLE CHEST 1 VIEW COMPARISON:  03/11/2019 FINDINGS: The left-sided catheter tip projects over the SVC. The heart size is  stable from prior study. There is worsening vascular congestion in worsening bilateral airspace opacities. There are likely bilateral pleural effusions. No evidence of a pneumothorax. The patient has been extubated. Enteric tube has been removed. IMPRESSION: 1. Status post extubation. 2. Remaining lines and tubes as above. 3. Worsening vascular congestion. Persistent small bilateral pleural effusions. Electronically Signed   By: Constance Holster M.D.   On: 03/13/2019 03:46    EKG:   Orders placed or performed during the hospital encounter of 03/08/19  . ED EKG  . ED EKG  . ED EKG  . ED EKG  . EKG 12-Lead  . EKG 12-Lead  . EKG 12-Lead  . EKG 12-Lead  . EKG 12-Lead  . EKG 12-Lead  . EKG 12-Lead  . EKG 12-Lead  . EKG 12-Lead  . EKG 12-Lead  . EKG 12-Lead  . EKG 12-Lead  . EKG 12-Lead  . EKG 12-Lead    ASSESSMENT AND PLAN:   28 year old male with past medical history significant for hypertension, IBS admitted secondary to overdose on Phenergan, losartan, Norvasc and Klonopin  1.  Acute respiratory failure-intubated for airway protection secondary to acute metabolic encephalopathy -Patient is extubated on 03/12/2019, management per ICU team -Alert and following commands.  Oriented x2 with intermittent confusion -On low-dose Precedex  2.  Intentional drug overdose-patient overdosed on 38 tablets of Phenergan, 19 tablets of losartan, 42 tablets of Norvasc and 78 tablets of Klonopin -Urine tox positive for benzos and tricyclics -Poison control has been contacted, management per ICU team at this time.  -  hypotensive-remains on  -Levophed, vasopressin.  Neo-Synephrine has been discontinued -On stress dose steroids and also albumin infusion -Appreciate psych consult.  Once patient is medically stable, will need inpatient psychiatry admission.  3.  Acute renal failure-likely ATN from hypotension.  -Started on CRRT, metabolic acidosis.  Appreciate nephrology consult -Continue to  monitor urine output-still minimal. -Continue CRRT and follow to see when patient can be converted to hemodialysis  4.   Pneumonia-follow-up procalcitonin and WBC.  Currently on Rocephin -Significantly improving WBC  5.  DVT prophylaxis-on heparin drip  6.  GERD-Protonix  7.  Elevated troponin-likely demand ischemia, troponin plateaued.  Started on IV heparin drip.  Most recent echo this admission showing normal LV function and no wall motion abnormalities.     All the records are reviewed and case discussed with Care Management/Social Workerr. Management plans discussed with the patient, family and they are in agreement.  CODE STATUS: Full code  TOTAL TIME TAKING CARE OF THIS PATIENT: 37 minutes.   POSSIBLE D/C IN ? DAYS, DEPENDING ON CLINICAL CONDITION.   Gladstone Lighter M.D on 03/13/2019 at 12:38 PM  Between 7am to 6pm - Pager - [phone]  After 6pm go to [email or web address] - password [facility]  Office  [phone]  CC: Primary care physician; Maeola Sarah, MD

## 2019-03-13 NOTE — Evaluation (Signed)
Clinical/Bedside Swallow Evaluation Patient Details  Name: Glenn Rasmussen MRN: [id number] Date of Birth: 09-08-1991  Today's Date: 03/13/2019 Time: SLP Start Time (ACUTE ONLY): 1113 SLP Stop Time (ACUTE ONLY): 1212 SLP Time Calculation (min) (ACUTE ONLY): 59 min  Past Medical History:  Past Medical History:  Diagnosis Date  . Hypertension   . IBS (irritable bowel syndrome)    Past Surgical History:  Past Surgical History:  Procedure Laterality Date  . APPENDECTOMY    . CHOLECYSTECTOMY    . TONSILLECTOMY     HPI:  H&P 03/09/2019: "Glenn Rasmussen  is a 28 y.o. male who presents with chief complaint as above.  Patient presents to the ED reporting multiple drug overdose.  He states that he is depressed and "just does not care about life anymore".  He states that he took a number of medications tonight including benzodiazepine, calcium channel blocker, Phenergan, amlodipine, and "just did not care what happened".  He is currently hypotensive, though awake and alert.  Treatment for his overdose was initiated in the ED and hospitalist were called for admission."  The patient was intubated 03/09/2019 AM and extubated 03/12/2019 AM.   Assessment / Plan / Recommendation Clinical Impression  The patient appears to be at minimal risk for aspiration following strict aspiration precautions.  He was assessed with ice chips, spoon sips water, straw sips water, applesauce, and soft solid.  His oxygen sats went down to 93% but came back up with time between trials.  There were no overt clinical indicators of aspiration- no cough, no throat clear, no vocal quality change.  Recommend dysphagia 2 diet with thin liquids.  Follow standard aspiration precautions as well as feeding slowly and allowing for time between swallows.  Recommend medication in applesauce. Will follow for diet toleration and diet upgrade as appropriate.   SLP Visit Diagnosis: Dysphagia, unspecified (R13.10)    Aspiration Risk  Mild  aspiration risk    Diet Recommendation Dysphagia 2 (Fine chop);Thin liquid   Liquid Administration via: Straw Medication Administration: Whole meds with puree Supervision: Staff to feed Compensations: Minimize environmental distractions;Slow rate;Small sips/bites;Other (Comment)(Allow recovery between bites/sips)    Other  Recommendations Oral Care Recommendations: Oral care BID;Staff/trained caregiver to provide oral care   Follow up Recommendations Other (comment)(TBD)      Frequency and Duration min 3x week  2 weeks       Prognosis Prognosis for Safe Diet Advancement: Good Barriers to Reach Goals: Other (Comment)(Medical status)      Swallow Study   General Date of Onset: 03/09/19 HPI: H&P 03/09/2019: "Glenn Rasmussen  is a 28 y.o. male who presents with chief complaint as above.  Patient presents to the ED reporting multiple drug overdose.  He states that he is depressed and "just does not care about life anymore".  He states that he took a number of medications tonight including benzodiazepine, calcium channel blocker, Phenergan, amlodipine, and "just did not care what happened".  He is currently hypotensive, though awake and alert.  Treatment for his overdose was initiated in the ED and hospitalist were called for admission."  The patient was intubated 03/09/2019 AM and extubated 03/12/2019 AM. Type of Study: Bedside Swallow Evaluation Previous Swallow Assessment: None known Diet Prior to this Study: NPO Temperature Spikes Noted: No Respiratory Status: Nasal cannula History of Recent Intubation: Yes Length of Intubations (days): 3 days Date extubated: 03/12/19 Behavior/Cognition: Cooperative;Pleasant mood;Lethargic/Drowsy;Other (Comment)(Appears weak) Oral Cavity Assessment: Within Functional Limits Oral Cavity - Dentition: Adequate natural dentition Self-Feeding  Abilities: Total assist Patient Positioning: Upright in bed Baseline Vocal Quality: Low vocal intensity Volitional  Cough: Strong Volitional Swallow: Able to elicit    Oral/Motor/Sensory Function Overall Oral Motor/Sensory Function: Within functional limits   Ice Chips Ice chips: Within functional limits Presentation: Spoon   Thin Liquid Thin Liquid: Within functional limits Presentation: Spoon;Straw    Nectar Thick     Honey Thick     Puree Puree: Within functional limits Presentation: Spoon   Solid     Solid: Within functional limits(Soft solids) Presentation: London Sheer, MS/CCC- SLP  Valetta Fuller, Daine Floras 03/13/2019,12:13 PM

## 2019-03-13 NOTE — Progress Notes (Signed)
Pt called out and said he could not see, everything is going black. Pupils reactive equal, able to follow commands. Taken to CT. CRRT clotted off at 0100, cartridge changed and CRRT restarted.

## 2019-03-13 NOTE — Progress Notes (Signed)
Called to bedside by RN as pt is complaining of "inbility to see out of both eyes".  Pt is awake and alert (but confused), pupils PERRLA 3 mm bilaterally, follows commands, no focal deficits, speech clear, no facial droop.  Pt is normotensive, vital signs stable.  Pt also complained of a "right headache".  Upon my arrival to bedside pt reports that he is able to see, no neuro or focal deficits.  Will obtain CT Head w/o Contrast to look for any source of ICP, EEG to r/o seizures, and Carotid Ultrasound.      Darel Hong, AGACNP-BC [facility] Pager: [phone] Cell: [phone]

## 2019-03-13 NOTE — Consult Note (Signed)
[place] [facility] Consult   Reason for Consult: Suicide attempt by overdose Referring Physician: ICU provider Patient Identification: Glenn Rasmussen MRN:  [id number] Principal Diagnosis: Intentional overdose Diagnosis:  Active Problems:   MDD (major depressive disorder), recurrent severe, without psychosis ([place])   Multiple drug overdose   Suicide attempt ([place])   HTN (hypertension)   AKI (acute kidney injury) ([place])   Acute respiratory failure ([place])   Antihypertensive agent overdose   Calcium channel blocker overdose  Patient is seen, chart is reviewed.  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 His mother is at his bedside with reminders of reality.  Once he is reminded about the overdose, he acknowledges it as a suicide attempt and he was very depressed.    HPI: On admission to ED: Glenn Rasmussen is a 28 y.o. male patient admitted with drug overdose and suicide attempt.  Patient presents to the ED reporting multiple drug overdose.  He states that he is depressed and "just does not care about life anymore".  He states that he took a number of medications tonight including benzodiazepine, calcium channel blocker, Phenergan, amlodipine, and "just did not care what happened".  He is currently hypotensive, though awake and alert.  Treatment for his overdose was initiated in the ED and hospitalist were called for admission.Marland Kitchen  Psychiatry consult is requested for evaluation and further management  Past Psychiatric History: Per medication review, patient appears to be treated for ADHD, anxiety, and insomnia.  On evaluation 03/12/19, patient has recently been extubated.  His mother is at his bedside.  He is coordinating a video phone conference with his brother.  Patient is smiling, and expressing  gratitude to have survived.  He endorses that he did have a suicide attempt by drug overdose.  Patient is currently denying suicidal ideation, plan or intent.  He denies HI.  He denies AVH.  Discussed with patient and mother plan for patient to medically stabilize while in the ICU, and then transferred to inpatient psychiatry.  Patient is agreeable to this plan.  03/13/19:  Patient seen and evaluated.  He wanted his mother to stay at the bedside.  She was attentive to him and worked to reorient him.  He was visible anxious with tube in nose and IVs in his neck.  Pleasant and smiled frequently but confused with the time and situation.  He could reorient with assistance.  Agreeable to inpatient psychiatry when medically stable.  Difficult to get more than a basic assessment due to his anxiety, medical condition, and confusion.  Mother was helpful and hoping to stay with him if the hospital agreed and she could get a recliner.  If not she would stay until she had to go to bed.  Risk to Self:  Yes Risk to Others:  Not known Prior Inpatient Therapy:  Not known Prior Outpatient Therapy:  Not known  Past Medical History:  Past Medical History:  Diagnosis Date  . Hypertension   . IBS (irritable bowel syndrome)     Past Surgical History:  Procedure Laterality Date  . APPENDECTOMY    . CHOLECYSTECTOMY    . TONSILLECTOMY     Family History:  Family History  Problem Relation Age of Onset  . Lung cancer Mother   . Lung cancer Father  Family Psychiatric  History: Unknown  Social History:  Social History   Substance and Sexual Activity  Alcohol Use No  . Frequency: Never     Social History   Substance and Sexual Activity  Drug Use No    Social History   Socioeconomic History  . Marital status: Single    Spouse name: Not on file  . Number of children: Not on file  . Years of education: Not on file  . Highest education level: Not on file  Occupational History  . Not on file  Social  Needs  . Financial resource strain: Not on file  . Food insecurity:    Worry: Not on file    Inability: Not on file  . Transportation needs:    Medical: Not on file    Non-medical: Not on file  Tobacco Use  . Smoking status: Never Smoker  . Smokeless tobacco: Never Used  Substance and Sexual Activity  . Alcohol use: No    Frequency: Never  . Drug use: No  . Sexual activity: Not on file  Lifestyle  . Physical activity:    Days per week: Not on file    Minutes per session: Not on file  . Stress: Not on file  Relationships  . Social connections:    Talks on phone: Not on file    Gets together: Not on file    Attends religious service: Not on file    Active member of club or organization: Not on file    Attends meetings of clubs or organizations: Not on file    Relationship status: Not on file  Other Topics Concern  . Not on file  Social History Narrative  . Not on file   Additional Social History:    Unknown  Allergies:  No Known Allergies  Labs:  Results for orders placed or performed during the hospital encounter of 03/08/19 (from the past 48 hour(s))  Glucose, capillary     Status: None   Collection Time: 03/11/19  7:14 PM  Result Value Ref Range   Glucose-Capillary 98 70 - 99 mg/dL  Glucose, capillary     Status: None   Collection Time: 03/11/19  8:47 PM  Result Value Ref Range   Glucose-Capillary 97 70 - 99 mg/dL  Renal function panel     Status: Abnormal   Collection Time: 03/11/19  8:56 PM  Result Value Ref Range   Sodium 137 135 - 145 mmol/L   Potassium 3.7 3.5 - 5.1 mmol/L   Chloride 103 98 - 111 mmol/L   CO2 23 22 - 32 mmol/L   Glucose, Bld 105 (H) 70 - 99 mg/dL   BUN 32 (H) 6 - 20 mg/dL   Creatinine, Ser 3.97 (H) 0.61 - 1.24 mg/dL   Calcium 8.4 (L) 8.9 - 10.3 mg/dL   Phosphorus 5.4 (H) 2.5 - 4.6 mg/dL   Albumin 3.1 (L) 3.5 - 5.0 g/dL   GFR calc non Af Amer 19 (L) >60 mL/min   GFR calc Af Amer 22 (L) >60 mL/min   Anion gap 11 5 - 15    Comment:  Performed at [facility], [name] [place], [place] [address]  Glucose, capillary     Status: None   Collection Time: 03/11/19 10:01 PM  Result Value Ref Range   Glucose-Capillary 84 70 - 99 mg/dL  Glucose, capillary     Status: None   Collection Time: 03/11/19 11:22 PM  Result Value Ref Range  Glucose-Capillary 83 70 - 99 mg/dL  Basic metabolic panel     Status: Abnormal   Collection Time: 03/11/19 11:30 PM  Result Value Ref Range   Sodium 136 135 - 145 mmol/L   Potassium 3.6 3.5 - 5.1 mmol/L   Chloride 103 98 - 111 mmol/L   CO2 24 22 - 32 mmol/L   Glucose, Bld 96 70 - 99 mg/dL   BUN 34 (H) 6 - 20 mg/dL   Creatinine, Ser 4.08 (H) 0.61 - 1.24 mg/dL   Calcium 8.3 (L) 8.9 - 10.3 mg/dL   GFR calc non Af Amer 18 (L) >60 mL/min   GFR calc Af Amer 21 (L) >60 mL/min   Anion gap 9 5 - 15    Comment: Performed at [facility], [address] [place], [place] [address]  Glucose, capillary     Status: Abnormal   Collection Time: 03/12/19 12:46 AM  Result Value Ref Range   Glucose-Capillary 69 (L) 70 - 99 mg/dL   Comment 1 QC Due   Renal function panel     Status: Abnormal   Collection Time: 03/12/19 12:48 AM  Result Value Ref Range   Sodium 136 135 - 145 mmol/L   Potassium 3.8 3.5 - 5.1 mmol/L   Chloride 104 98 - 111 mmol/L   CO2 24 22 - 32 mmol/L   Glucose, Bld 82 70 - 99 mg/dL   BUN 32 (H) 6 - 20 mg/dL   Creatinine, Ser 3.85 (H) 0.61 - 1.24 mg/dL   Calcium 8.5 (L) 8.9 - 10.3 mg/dL   Phosphorus 5.5 (H) 2.5 - 4.6 mg/dL   Albumin 2.8 (L) 3.5 - 5.0 g/dL   GFR calc non Af Amer 20 (L) >60 mL/min   GFR calc Af Amer 23 (L) >60 mL/min   Anion gap 8 5 - 15    Comment: Performed at [facility], [address] [place], [place] [address]  Glucose, capillary     Status: None   Collection Time: 03/12/19  1:09 AM  Result Value Ref Range   Glucose-Capillary 73 70 - 99 mg/dL  Glucose, capillary     Status: None   Collection Time: 03/12/19  1:15 AM   Result Value Ref Range   Glucose-Capillary 76 70 - 99 mg/dL  Magnesium     Status: None   Collection Time: 03/12/19  1:46 AM  Result Value Ref Range   Magnesium 2.0 1.7 - 2.4 mg/dL    Comment: Performed at [facility], [address] [place], [place] [address]  CBC     Status: Abnormal   Collection Time: 03/12/19  1:46 AM  Result Value Ref Range   WBC 23.8 (H) 4.0 - 10.5 K/uL   RBC 3.54 (L) 4.22 - 5.81 MIL/uL   Hemoglobin 9.6 (L) 13.0 - 17.0 g/dL   HCT 28.1 (L) 39.0 - 52.0 %   MCV 79.4 (L) 80.0 - 100.0 fL   MCH 27.1 26.0 - 34.0 pg   MCHC 34.2 30.0 - 36.0 g/dL   RDW 12.9 11.5 - 15.5 %   Platelets 156 150 - 400 K/uL   nRBC 0.1 0.0 - 0.2 %    Comment: Performed at [facility], [address] [place],  [address]  Basic metabolic panel     Status: Abnormal   Collection Time: 03/12/19  1:46 AM  Result Value Ref Range   Sodium 136 135 - 145 mmol/L   Potassium 3.7 3.5 - 5.1 mmol/L  Chloride 104 98 - 111 mmol/L   CO2 24 22 - 32 mmol/L   Glucose, Bld 80 70 - 99 mg/dL   BUN 32 (H) 6 - 20 mg/dL   Creatinine, Ser 3.86 (H) 0.61 - 1.24 mg/dL   Calcium 8.5 (L) 8.9 - 10.3 mg/dL   GFR calc non Af Amer 20 (L) >60 mL/min   GFR calc Af Amer 23 (L) >60 mL/min   Anion gap 8 5 - 15    Comment: Performed at [facility], [address] [place], [place] [address]  Glucose, capillary     Status: None   Collection Time: 03/12/19  2:33 AM  Result Value Ref Range   Glucose-Capillary 81 70 - 99 mg/dL  Basic metabolic panel     Status: Abnormal   Collection Time: 03/12/19  2:36 AM  Result Value Ref Range   Sodium 135 135 - 145 mmol/L   Potassium 3.6 3.5 - 5.1 mmol/L   Chloride 103 98 - 111 mmol/L   CO2 23 22 - 32 mmol/L   Glucose, Bld 80 70 - 99 mg/dL   BUN 31 (H) 6 - 20 mg/dL   Creatinine, Ser 3.62 (H) 0.61 - 1.24 mg/dL   Calcium 8.5 (L) 8.9 - 10.3 mg/dL   GFR calc non Af Amer 21 (L) >60 mL/min   GFR calc Af Amer 25 (L) >60 mL/min   Anion gap 9 5 -  15    Comment: Performed at [facility], [address] [place], [place] [address]  Glucose, capillary     Status: None   Collection Time: 03/12/19  3:33 AM  Result Value Ref Range   Glucose-Capillary 75 70 - 99 mg/dL  Basic metabolic panel     Status: Abnormal   Collection Time: 03/12/19  3:36 AM  Result Value Ref Range   Sodium 138 135 - 145 mmol/L   Potassium 3.8 3.5 - 5.1 mmol/L   Chloride 105 98 - 111 mmol/L   CO2 24 22 - 32 mmol/L   Glucose, Bld 77 70 - 99 mg/dL   BUN 31 (H) 6 - 20 mg/dL   Creatinine, Ser 3.61 (H) 0.61 - 1.24 mg/dL   Calcium 8.4 (L) 8.9 - 10.3 mg/dL   GFR calc non Af Amer 21 (L) >60 mL/min   GFR calc Af Amer 25 (L) >60 mL/min   Anion gap 9 5 - 15    Comment: Performed at [facility], [address] [place], [place] [address]  Renal function panel     Status: Abnormal   Collection Time: 03/12/19  5:28 AM  Result Value Ref Range   Sodium 136 135 - 145 mmol/L   Potassium 3.6 3.5 - 5.1 mmol/L   Chloride 104 98 - 111 mmol/L   CO2 24 22 - 32 mmol/L   Glucose, Bld 68 (L) 70 - 99 mg/dL   BUN 32 (H) 6 - 20 mg/dL   Creatinine, Ser 3.64 (H) 0.61 - 1.24 mg/dL   Calcium 8.2 (L) 8.9 - 10.3 mg/dL   Phosphorus 4.4 2.5 - 4.6 mg/dL   Albumin 2.9 (L) 3.5 - 5.0 g/dL   GFR calc non Af Amer 21 (L) >60 mL/min   GFR calc Af Amer 24 (L) >60 mL/min   Anion gap 8 5 - 15    Comment: Performed at [facility], [address] [place], [place] [address]  Glucose, capillary     Status: Abnormal   Collection Time: 03/12/19  5:38 AM  Result Value Ref Range   Glucose-Capillary 58 (L) 70 - 99 mg/dL  Glucose, capillary     Status: Abnormal   Collection Time: 03/12/19  5:39 AM  Result Value Ref Range   Glucose-Capillary 65 (L) 70 - 99 mg/dL  Glucose, capillary     Status: Abnormal   Collection Time: 03/12/19  6:12 AM  Result Value Ref Range   Glucose-Capillary 115 (H) 70 - 99 mg/dL  Renal function panel     Status: Abnormal   Collection Time:  03/12/19  7:52 AM  Result Value Ref Range   Sodium 134 (L) 135 - 145 mmol/L   Potassium 4.3 3.5 - 5.1 mmol/L   Chloride 101 98 - 111 mmol/L   CO2 23 22 - 32 mmol/L   Glucose, Bld 193 (H) 70 - 99 mg/dL   BUN 31 (H) 6 - 20 mg/dL   Creatinine, Ser 3.41 (H) 0.61 - 1.24 mg/dL   Calcium 8.2 (L) 8.9 - 10.3 mg/dL   Phosphorus 4.6 2.5 - 4.6 mg/dL   Albumin 2.7 (L) 3.5 - 5.0 g/dL   GFR calc non Af Amer 23 (L) >60 mL/min   GFR calc Af Amer 26 (L) >60 mL/min   Anion gap 10 5 - 15    Comment: Performed at [facility], [address] [place], [place] [address]  Glucose, capillary     Status: Abnormal   Collection Time: 03/12/19  7:58 AM  Result Value Ref Range   Glucose-Capillary 168 (H) 70 - 99 mg/dL  Glucose, capillary     Status: Abnormal   Collection Time: 03/12/19  8:56 AM  Result Value Ref Range   Glucose-Capillary 200 (H) 70 - 99 mg/dL  Glucose, capillary     Status: Abnormal   Collection Time: 03/12/19 10:08 AM  Result Value Ref Range   Glucose-Capillary 199 (H) 70 - 99 mg/dL  Glucose, capillary     Status: Abnormal   Collection Time: 03/12/19 11:11 AM  Result Value Ref Range   Glucose-Capillary 178 (H) 70 - 99 mg/dL  Glucose, capillary     Status: Abnormal   Collection Time: 03/12/19 12:12 PM  Result Value Ref Range   Glucose-Capillary 178 (H) 70 - 99 mg/dL  Renal function panel     Status: Abnormal   Collection Time: 03/12/19 12:51 PM  Result Value Ref Range   Sodium 133 (L) 135 - 145 mmol/L   Potassium 4.4 3.5 - 5.1 mmol/L   Chloride 98 98 - 111 mmol/L   CO2 21 (L) 22 - 32 mmol/L   Glucose, Bld 200 (H) 70 - 99 mg/dL   BUN 34 (H) 6 - 20 mg/dL   Creatinine, Ser 3.41 (H) 0.61 - 1.24 mg/dL   Calcium 8.1 (L) 8.9 - 10.3 mg/dL   Phosphorus 4.4 2.5 - 4.6 mg/dL   Albumin 3.0 (L) 3.5 - 5.0 g/dL   GFR calc non Af Amer 23 (L) >60 mL/min   GFR calc Af Amer 26 (L) >60 mL/min   Anion gap 14 5 - 15    Comment: Performed at [facility], [address]  [place], [place] [address]  Renal function panel     Status: Abnormal   Collection Time: 03/12/19  5:04 PM  Result Value Ref Range   Sodium 135 135 - 145 mmol/L   Potassium 4.3 3.5 - 5.1 mmol/L   Chloride 102 98 - 111 mmol/L   CO2 23 22 - 32 mmol/L  Glucose, Bld 182 (H) 70 - 99 mg/dL   BUN 38 (H) 6 - 20 mg/dL   Creatinine, Ser 3.73 (H) 0.61 - 1.24 mg/dL   Calcium 8.2 (L) 8.9 - 10.3 mg/dL   Phosphorus 5.2 (H) 2.5 - 4.6 mg/dL   Albumin 2.9 (L) 3.5 - 5.0 g/dL   GFR calc non Af Amer 20 (L) >60 mL/min   GFR calc Af Amer 24 (L) >60 mL/min   Anion gap 10 5 - 15    Comment: Performed at [facility], [address] [place], [place] [address]  Glucose, capillary     Status: Abnormal   Collection Time: 03/12/19  5:14 PM  Result Value Ref Range   Glucose-Capillary 178 (H) 70 - 99 mg/dL  Troponin I - Once     Status: Abnormal   Collection Time: 03/12/19  6:33 PM  Result Value Ref Range   Troponin I 0.24 (HH) <0.03 ng/mL    Comment: CRITICAL RESULT CALLED TO, READ BACK BY AND VERIFIED WITH SANDRA BORBA @1858  03/12/19 MJU Performed at [facility], [address] [place], [place] [address]   Glucose, capillary     Status: Abnormal   Collection Time: 03/12/19  8:00 PM  Result Value Ref Range   Glucose-Capillary 163 (H) 70 - 99 mg/dL   Comment 1 Notify RN    Comment 2 Document in Chart   Renal function panel     Status: Abnormal   Collection Time: 03/12/19  8:42 PM  Result Value Ref Range   Sodium 138 135 - 145 mmol/L   Potassium 4.3 3.5 - 5.1 mmol/L   Chloride 104 98 - 111 mmol/L   CO2 21 (L) 22 - 32 mmol/L   Glucose, Bld 164 (H) 70 - 99 mg/dL   BUN 40 (H) 6 - 20 mg/dL   Creatinine, Ser 3.69 (H) 0.61 - 1.24 mg/dL   Calcium 8.2 (L) 8.9 - 10.3 mg/dL   Phosphorus 4.7 (H) 2.5 - 4.6 mg/dL   Albumin 3.1 (L) 3.5 - 5.0 g/dL   GFR calc non Af Amer 21 (L) >60 mL/min   GFR calc Af Amer 24 (L) >60 mL/min   Anion gap 13 5 - 15    Comment: Performed at [facility],  [address] [place], [place] [address]  Troponin I - Once     Status: Abnormal   Collection Time: 03/12/19 11:02 PM  Result Value Ref Range   Troponin I 0.29 (HH) <0.03 ng/mL    Comment: CRITICAL VALUE NOTED. VALUE IS CONSISTENT WITH PREVIOUSLY REPORTED/CALLED VALUE. QSD Performed at [facility], [address] [place], [place] [address]   Glucose, capillary     Status: Abnormal   Collection Time: 03/13/19 12:05 AM  Result Value Ref Range   Glucose-Capillary 154 (H) 70 - 99 mg/dL   Comment 1 Notify RN    Comment 2 Document in Chart   Renal function panel     Status: Abnormal   Collection Time: 03/13/19 12:47 AM  Result Value Ref Range   Sodium 137 135 - 145 mmol/L   Potassium 4.0 3.5 - 5.1 mmol/L   Chloride 103 98 - 111 mmol/L   CO2 21 (L) 22 - 32 mmol/L   Glucose, Bld 168 (H) 70 - 99 mg/dL   BUN 40 (H) 6 - 20 mg/dL   Creatinine, Ser 3.48 (H) 0.61 - 1.24 mg/dL   Calcium 8.0 (L) 8.9 - 10.3 mg/dL   Phosphorus 4.2 2.5 -  4.6 mg/dL   Albumin 3.1 (L) 3.5 - 5.0 g/dL   GFR calc non Af Amer 22 (L) >60 mL/min   GFR calc Af Amer 26 (L) >60 mL/min   Anion gap 13 5 - 15    Comment: Performed at [facility], [address] [place], [place] [address]  Glucose, capillary     Status: Abnormal   Collection Time: 03/13/19  3:49 AM  Result Value Ref Range   Glucose-Capillary 148 (H) 70 - 99 mg/dL  Renal function panel     Status: Abnormal   Collection Time: 03/13/19  4:41 AM  Result Value Ref Range   Sodium 136 135 - 145 mmol/L   Potassium 3.8 3.5 - 5.1 mmol/L   Chloride 104 98 - 111 mmol/L   CO2 21 (L) 22 - 32 mmol/L   Glucose, Bld 159 (H) 70 - 99 mg/dL   BUN 43 (H) 6 - 20 mg/dL   Creatinine, Ser 3.63 (H) 0.61 - 1.24 mg/dL   Calcium 8.0 (L) 8.9 - 10.3 mg/dL   Phosphorus 4.2 2.5 - 4.6 mg/dL   Albumin 3.0 (L) 3.5 - 5.0 g/dL   GFR calc non Af Amer 21 (L) >60 mL/min   GFR calc Af Amer 25 (L) >60 mL/min   Anion gap 11 5 - 15    Comment: Performed at [facility], [address] [place], [place] [address]  Magnesium     Status: None   Collection Time: 03/13/19  4:41 AM  Result Value Ref Range   Magnesium 2.3 1.7 - 2.4 mg/dL    Comment: Performed at [facility], [address] [place], [place] [address]  CBC     Status: Abnormal   Collection Time: 03/13/19  4:41 AM  Result Value Ref Range   WBC 17.1 (H) 4.0 - 10.5 K/uL   RBC 2.97 (L) 4.22 - 5.81 MIL/uL   Hemoglobin 8.1 (L) 13.0 - 17.0 g/dL   HCT 23.3 (L) 39.0 - 52.0 %   MCV 78.5 (L) 80.0 - 100.0 fL   MCH 27.3 26.0 - 34.0 pg   MCHC 34.8 30.0 - 36.0 g/dL   RDW 12.4 11.5 - 15.5 %   Platelets 140 (L) 150 - 400 K/uL   nRBC 0.0 0.0 - 0.2 %    Comment: Performed at [facility], [address] [place], [place] [address]  Troponin I - Once-Timed     Status: Abnormal   Collection Time: 03/13/19  4:41 AM  Result Value Ref Range   Troponin I 0.27 (HH) <0.03 ng/mL    Comment: CRITICAL VALUE NOTED. VALUE IS CONSISTENT WITH PREVIOUSLY REPORTED/CALLED VALUE. QSD Performed at [facility], [address] [place], [place] [address]   Lipid panel     Status: Abnormal   Collection Time: 03/13/19  4:41 AM  Result Value Ref Range   Cholesterol 154 0 - 200 mg/dL   Triglycerides 258 (H) <150 mg/dL   HDL 32 (L) >40 mg/dL   Total CHOL/HDL Ratio 4.8 RATIO   VLDL 52 (H) 0 - 40 mg/dL   LDL Cholesterol 70 0 - 99 mg/dL    Comment:        Total Cholesterol/HDL:CHD Risk Coronary Heart Disease Risk Table                     Men   Women  1/2 Average Risk   3.4   3.3  Average Risk  5.0   4.4  2 X Average Risk   9.6   7.1  3 X Average Risk  23.4   11.0        Use the calculated Patient Ratio above and the CHD Risk Table to determine the patient's CHD Risk.        ATP III CLASSIFICATION (LDL):  <100     mg/dL   Optimal  100-129  mg/dL   Near or Above                    Optimal  130-159  mg/dL   Borderline  160-189  mg/dL   High  >190     mg/dL   Very  High Performed at [facility], [address] [place], [place] [address]   Glucose, capillary     Status: Abnormal   Collection Time: 03/13/19  8:34 AM  Result Value Ref Range   Glucose-Capillary 155 (H) 70 - 99 mg/dL  Glucose, capillary     Status: Abnormal   Collection Time: 03/13/19 11:53 AM  Result Value Ref Range   Glucose-Capillary 124 (H) 70 - 99 mg/dL  Renal function panel     Status: Abnormal   Collection Time: 03/13/19 11:55 AM  Result Value Ref Range   Sodium 137 135 - 145 mmol/L   Potassium 3.7 3.5 - 5.1 mmol/L   Chloride 104 98 - 111 mmol/L   CO2 21 (L) 22 - 32 mmol/L   Glucose, Bld 134 (H) 70 - 99 mg/dL   BUN 46 (H) 6 - 20 mg/dL   Creatinine, Ser 3.83 (H) 0.61 - 1.24 mg/dL   Calcium 8.0 (L) 8.9 - 10.3 mg/dL   Phosphorus 3.6 2.5 - 4.6 mg/dL   Albumin 3.2 (L) 3.5 - 5.0 g/dL   GFR calc non Af Amer 20 (L) >60 mL/min   GFR calc Af Amer 23 (L) >60 mL/min   Anion gap 12 5 - 15    Comment: Performed at [facility], [address] [place],  [address]  Glucose, capillary     Status: Abnormal   Collection Time: 03/13/19  3:39 PM  Result Value Ref Range   Glucose-Capillary 115 (H) 70 - 99 mg/dL  Renal function panel     Status: Abnormal   Collection Time: 03/13/19  5:32 PM  Result Value Ref Range   Sodium 135 135 - 145 mmol/L   Potassium 3.4 (L) 3.5 - 5.1 mmol/L   Chloride 102 98 - 111 mmol/L   CO2 20 (L) 22 - 32 mmol/L   Glucose, Bld 126 (H) 70 - 99 mg/dL   BUN 45 (H) 6 - 20 mg/dL   Creatinine, Ser 3.56 (H) 0.61 - 1.24 mg/dL   Calcium 8.0 (L) 8.9 - 10.3 mg/dL   Phosphorus 3.4 2.5 - 4.6 mg/dL   Albumin 3.3 (L) 3.5 - 5.0 g/dL   GFR calc non Af Amer 22 (L) >60 mL/min   GFR calc Af Amer 25 (L) >60 mL/min   Anion gap 13 5 - 15    Comment: Performed at [facility], [address] [place], [place] [address]  Heparin level (unfractionated)     Status: Abnormal   Collection Time: 03/13/19  5:32 PM  Result Value Ref Range    Heparin Unfractionated 0.28 (L) 0.30 - 0.70 IU/mL    Comment: (NOTE) If heparin results are below expected values, and patient dosage has  been confirmed, suggest follow up testing of  antithrombin III levels. Performed at [facility], [address], [place], [place] [address]     Current Facility-Administered Medications  Medication Dose Route Frequency Provider Last Rate Last Dose  . albumin human 25 % solution 12.5 g  12.5 g Intravenous BID Murlean Iba, MD 60 mL/hr at 03/13/19 1059 12.5 g at 03/13/19 1059  . bisacodyl (DULCOLAX) suppository 10 mg  10 mg Rectal Daily PRN Tukov-Yual, Magdalene S, NP      . cefTRIAXone (ROCEPHIN) 2 g in sodium chloride 0.9 % 100 mL IVPB  2 g Intravenous Q24H Flora Lipps, MD   Stopped at 03/12/19 1737  . Chlorhexidine Gluconate Cloth 2 % PADS 6 each  6 each Topical Q0600 Lance Coon, MD   6 each at 03/13/19 0535  . heparin ADULT infusion 100 units/mL (25000 units/266mL sodium chloride 0.45%)  1,100 Units/hr Intravenous Continuous Flora Lipps, MD 11 mL/hr at 03/13/19 0930 1,100 Units/hr at 03/13/19 0930  . heparin injection 1,000-6,000 Units  1,000-6,000 Units CRRT PRN Murlean Iba, MD   2,600 Units at 03/13/19 0845  . hydrocortisone sodium succinate (SOLU-CORTEF) 100 MG injection 50 mg  50 mg Intravenous Q6H Flora Lipps, MD   50 mg at 03/13/19 1603  . insulin aspart (novoLOG) injection 0-15 Units  0-15 Units Subcutaneous Q4H Kasa, Kurian, MD      . ipratropium-albuterol (DUONEB) 0.5-2.5 (3) MG/3ML nebulizer solution 3 mL  3 mL Nebulization Q6H Tukov-Yual, Magdalene S, NP   3 mL at 03/13/19 1356  . midodrine (PROAMATINE) tablet 5 mg  5 mg Oral TID WC Flora Lipps, MD   5 mg at 03/13/19 1605  . morphine 2 MG/ML injection 2 mg  2 mg Intravenous Q2H PRN Darel Hong D, NP   2 mg at 03/13/19 0432  . norepinephrine (LEVOPHED) 16 mg in 292mL premix infusion  0-75 mcg/min Intravenous Titrated Flora Lipps, MD 51.6 mL/hr at 03/13/19 0534 55  mcg/min at 03/13/19 0534  . pantoprazole (PROTONIX) injection 40 mg  40 mg Intravenous Q12H Ottie Glazier, MD   40 mg at 03/13/19 1058  . pureflow IV solution for Dialysis   CRRT Continuous Darel Hong D, NP 2,500 mL/hr at 03/13/19 0830    . sennosides (SENOKOT) 8.8 MG/5ML syrup 5 mL  5 mL Per Tube BID PRN Tukov-Yual, Magdalene S, NP      . sodium chloride flush (NS) 0.9 % injection 10-40 mL  10-40 mL Intracatheter Q12H Tukov-Yual, Magdalene S, NP   10 mL at 03/12/19 2136  . sodium chloride flush (NS) 0.9 % injection 10-40 mL  10-40 mL Intracatheter PRN Tukov-Yual, Arlyss Gandy, NP        Musculoskeletal: Strength & Muscle Tone: decreased Gait & Station: Not assessed Patient leans: N/A  Psychiatric Specialty Exam: Physical Exam  Nursing note and vitals reviewed. Constitutional: He appears well-developed and well-nourished.  HENT:  Head: Normocephalic and atraumatic.  Eyes: EOM are normal.  Neck: Normal range of motion.  Cardiovascular: Regular rhythm.  Respiratory: Effort normal. No respiratory distress.  With respiratory pressure support  Musculoskeletal: Normal range of motion.  Neurological: He is alert.  Psychiatric: His speech is normal. His mood appears anxious. Cognition and memory are impaired. He expresses impulsivity. He exhibits a depressed mood. He expresses suicidal ideation. He expresses suicidal plans. He is inattentive.    Review of Systems  Respiratory: Positive for shortness of breath.   Neurological: Positive for weakness.  Psychiatric/Behavioral: Positive for depression and suicidal ideas. Negative for hallucinations and substance abuse.  The patient is nervous/anxious. The patient does not have insomnia.   All other systems reviewed and are negative.   Blood pressure 125/80, pulse (!) 102, temperature 98.9 F (37.2 C), temperature source Axillary, resp. rate (!) 23, height 5' 7.01" (1.702 m), weight 109.2 kg, SpO2 97 %.Body mass index is 37.7 kg/m.   General Appearance: Casual  Eye Contact:  Fair  Speech:  Clear and Coherent  Volume:  Normal  Mood:  Anxious  Affect:  Congruent  Thought Process:  Altered, confused   Orientation:  Person  Thought Content:  Hallucinations: None  Suicidal Thoughts:  Denies today, however admission for intentional drug overdose as suicide attempt.  Homicidal Thoughts:  No  Memory:  Poor  Judgement:  Poor  Insight:  Present and Shallow  Psychomotor Activity:  Decreased  Concentration:  Concentration: Fair  Recall:  [employer] of Knowledge:  Fair  Language:  Good  Akathisia:  No  Handed:  Right  AIMS (if indicated):     Assets:  Social Support patient has given consent to talk to mother, [name] [phone]  ADL's:  Impaired  Cognition:  Impaired,  Mild  Sleep:   Adequate    Treatment Plan Summary: Daily contact with patient to assess and evaluate symptoms and progress in treatment and Medication management Major depressive disorder, recurrent,severe without psychosis: Hold psychiatric medication at this time until patient more medically stable.  -inpatient psychiatric admission when medically cleared  Disposition: Recommend psychiatric Inpatient admission when medically cleared. Supportive therapy provided about ongoing stressors.  patient has given consent to talk to mother, [name] [phone]  Psychiatry will continue to follow and advise medication management as indicated.  Waylan Boga, NP 03/13/2019 7:07 PM

## 2019-03-13 NOTE — TOC Initial Note (Signed)
Transition of Care [facility]) - Initial/Assessment Note    Patient Details  Name: Glenn Rasmussen MRN: [id number] Date of Birth: July 28, 1991  Transition of [facility] CM/SW Contact:    Shelbie Hutching, RN Phone Number: 03/13/2019, 4:52 PM  Clinical Narrative:                 Patient admitted after multiple drug overdose.  Patient is critically ill initially requiring intubation.  Patient was extubated yesterday and is tolerating HFNC 60%.  Patient is still requiring vasopressors to maintain adequate blood pressure.  Patient is also on CRRT.  Mother is at the bedside, patient reports that his mother lives with him and has been for the past 3 months.  Patient was let go from his job and has been having issues since so his mother was there to help him.  Baseline patient is independent and drives.  PCP verified, prescriptions gotten at [facility].  Patient is under IVC for intentional drug overdose, psych is following.  TOC team will monitor patient progress and assist with any discharge needs.   Expected Discharge Plan: [place] Barriers to Discharge: Continued Medical Work up   Patient Goals and CMS Choice Patient states their goals for this hospitalization and ongoing recovery are:: Get better and get to go home      Expected Discharge Plan and Services Expected Discharge Plan: [employer]   Discharge Planning Services: CM Consult   Living arrangements for the past 2 months: Apartment                                      Prior Living Arrangements/Services Living arrangements for the past 2 months: Apartment Lives with:: Parents(Mother) Patient language and need for interpreter reviewed:: No Do you feel safe going back to the place where you live?: Yes      Need for Family Participation in Patient Care: Yes (Comment) Care giver support system in place?: Yes (comment)(Mother)   Criminal Activity/Legal Involvement Pertinent to Current  Situation/Hospitalization: No - Comment as needed  Activities of Daily Living      Permission Sought/Granted Permission sought to share information with : Family Supports Permission granted to share information with : Yes, Verbal Permission Granted        Permission granted to share info w Relationship: Mother Mickel Baas     Emotional Assessment Appearance:: Appears stated age Attitude/Demeanor/Rapport: Engaged Affect (typically observed): Accepting Orientation: : Oriented to Self, Oriented to Place, Oriented to  Time Alcohol / Substance Use: Other (comment)(drug overdose on multiple medications) Psych Involvement: Yes (comment)  Admission diagnosis:  Suicidal ideation [R45.851] Suicide attempt ([place]) [T14.91XA] Intentional benzodiazepine overdose, initial encounter ([place]) [T42.4X2A] Calcium channel blocker overdose, intentional self-harm, initial encounter ([place]) [T46.1X2A] Antihypertensive agent overdose, intentional self-harm, initial encounter ([place]) [T46.5X2A] Hypotension, unspecified hypotension type [I95.9] Depression, unspecified depression type [F32.9] Patient Active Problem List   Diagnosis Date Noted  . Multiple drug overdose 03/09/2019  . Suicide attempt ([place]) 03/09/2019  . HTN (hypertension) 03/09/2019  . AKI (acute kidney injury) ([place]) 03/09/2019  . Acute respiratory failure ([place])   . Antihypertensive agent overdose   . Calcium channel blocker overdose    PCP:  Maeola Sarah, MD Pharmacy:   [facility] [place] [facility] [address] [place] [address] Phone:  [phone] Fax: [phone]     Social Determinants of Health (SDOH) Interventions    Readmission Risk Interventions No flowsheet data found.

## 2019-03-14 ENCOUNTER — Inpatient Hospital Stay: Payer: BC Managed Care – PPO

## 2019-03-14 DIAGNOSIS — T424X2A Poisoning by benzodiazepines, intentional self-harm, initial encounter: Secondary | ICD-10-CM | POA: Diagnosis present

## 2019-03-14 LAB — COMPREHENSIVE METABOLIC PANEL
ALT: 35 U/L (ref 0–44)
AST: 38 U/L (ref 15–41)
Albumin: 3.1 g/dL — ABNORMAL LOW (ref 3.5–5.0)
Alkaline Phosphatase: 39 U/L (ref 38–126)
Anion gap: 10 (ref 5–15)
BUN: 41 mg/dL — ABNORMAL HIGH (ref 6–20)
CO2: 24 mmol/L (ref 22–32)
Calcium: 8.2 mg/dL — ABNORMAL LOW (ref 8.9–10.3)
Chloride: 104 mmol/L (ref 98–111)
Creatinine, Ser: 3.05 mg/dL — ABNORMAL HIGH (ref 0.61–1.24)
GFR calc Af Amer: 30 mL/min — ABNORMAL LOW (ref >60)
GFR calc non Af Amer: 26 mL/min — ABNORMAL LOW (ref >60)
Glucose, Bld: 100 mg/dL — ABNORMAL HIGH (ref 70–99)
Potassium: 3.2 mmol/L — ABNORMAL LOW (ref 3.5–5.1)
Sodium: 138 mmol/L (ref 135–145)
Total Bilirubin: 0.7 mg/dL (ref 0.3–1.2)
Total Protein: 5.6 g/dL — ABNORMAL LOW (ref 6.5–8.1)

## 2019-03-14 LAB — RENAL FUNCTION PANEL
Albumin: 3.4 g/dL — ABNORMAL LOW (ref 3.5–5.0)
Albumin: 3.7 g/dL (ref 3.5–5.0)
Albumin: 3.4 g/dL — ABNORMAL LOW (ref 3.5–5.0)
Anion gap: 13 (ref 5–15)
Anion gap: 12 (ref 5–15)
Anion gap: 10 (ref 5–15)
BUN: 39 mg/dL — ABNORMAL HIGH (ref 6–20)
BUN: 38 mg/dL — ABNORMAL HIGH (ref 6–20)
BUN: 37 mg/dL — ABNORMAL HIGH (ref 6–20)
CO2: 21 mmol/L — ABNORMAL LOW (ref 22–32)
CO2: 23 mmol/L (ref 22–32)
CO2: 24 mmol/L (ref 22–32)
Calcium: 8.5 mg/dL — ABNORMAL LOW (ref 8.9–10.3)
Calcium: 8.5 mg/dL — ABNORMAL LOW (ref 8.9–10.3)
Calcium: 8.6 mg/dL — ABNORMAL LOW (ref 8.9–10.3)
Chloride: 104 mmol/L (ref 98–111)
Chloride: 102 mmol/L (ref 98–111)
Chloride: 103 mmol/L (ref 98–111)
Creatinine, Ser: 3.01 mg/dL — ABNORMAL HIGH (ref 0.61–1.24)
Creatinine, Ser: 3.03 mg/dL — ABNORMAL HIGH (ref 0.61–1.24)
Creatinine, Ser: 3.02 mg/dL — ABNORMAL HIGH (ref 0.61–1.24)
GFR calc Af Amer: 31 mL/min — ABNORMAL LOW (ref >60)
GFR calc Af Amer: 31 mL/min — ABNORMAL LOW (ref >60)
GFR calc Af Amer: 31 mL/min — ABNORMAL LOW (ref >60)
GFR calc non Af Amer: 27 mL/min — ABNORMAL LOW (ref >60)
GFR calc non Af Amer: 26 mL/min — ABNORMAL LOW (ref >60)
GFR calc non Af Amer: 26 mL/min — ABNORMAL LOW (ref >60)
Glucose, Bld: 118 mg/dL — ABNORMAL HIGH (ref 70–99)
Glucose, Bld: 118 mg/dL — ABNORMAL HIGH (ref 70–99)
Glucose, Bld: 108 mg/dL — ABNORMAL HIGH (ref 70–99)
Phosphorus: 2.7 mg/dL (ref 2.5–4.6)
Phosphorus: 2.4 mg/dL — ABNORMAL LOW (ref 2.5–4.6)
Phosphorus: 2.3 mg/dL — ABNORMAL LOW (ref 2.5–4.6)
Potassium: 3.5 mmol/L (ref 3.5–5.1)
Potassium: 3.1 mmol/L — ABNORMAL LOW (ref 3.5–5.1)
Potassium: 3.1 mmol/L — ABNORMAL LOW (ref 3.5–5.1)
Sodium: 138 mmol/L (ref 135–145)
Sodium: 137 mmol/L (ref 135–145)
Sodium: 137 mmol/L (ref 135–145)

## 2019-03-14 LAB — CULTURE, BLOOD (ROUTINE X 2)

## 2019-03-14 LAB — HEPARIN LEVEL (UNFRACTIONATED)
Heparin Unfractionated: 0.29 IU/mL — ABNORMAL LOW (ref 0.30–0.70)
Heparin Unfractionated: 0.29 IU/mL — ABNORMAL LOW (ref 0.30–0.70)

## 2019-03-14 LAB — CBC
HCT: 20.2 % — ABNORMAL LOW (ref 39.0–52.0)
Hemoglobin: 7 g/dL — ABNORMAL LOW (ref 13.0–17.0)
MCH: 27.1 pg (ref 26.0–34.0)
MCHC: 34.7 g/dL (ref 30.0–36.0)
MCV: 78.3 fL — ABNORMAL LOW (ref 80.0–100.0)
Platelets: 154 10*3/uL (ref 150–400)
RBC: 2.58 MIL/uL — ABNORMAL LOW (ref 4.22–5.81)
RDW: 12.9 % (ref 11.5–15.5)
WBC: 11.1 10*3/uL — ABNORMAL HIGH (ref 4.0–10.5)
nRBC: 0.3 % — ABNORMAL HIGH (ref 0.0–0.2)

## 2019-03-14 LAB — HEMOGLOBIN A1C
Hgb A1c MFr Bld: 5.5 % (ref 4.8–5.6)
Mean Plasma Glucose: 111 mg/dL

## 2019-03-14 LAB — GLUCOSE, CAPILLARY
Glucose-Capillary: 101 mg/dL — ABNORMAL HIGH (ref 70–99)
Glucose-Capillary: 94 mg/dL (ref 70–99)
Glucose-Capillary: 95 mg/dL (ref 70–99)
Glucose-Capillary: 115 mg/dL — ABNORMAL HIGH (ref 70–99)

## 2019-03-14 LAB — MAGNESIUM: Magnesium: 2.2 mg/dL (ref 1.7–2.4)

## 2019-03-14 LAB — TROPONIN I: Troponin I: 0.25 ng/mL (ref <0.03)

## 2019-03-14 MED ORDER — POTASSIUM CHLORIDE 10 MEQ/100ML IV SOLN
10.0000 meq | INTRAVENOUS | Status: AC
  Administered 2019-03-14 (×2): 10 meq via INTRAVENOUS
  Filled 2019-03-14 (×2): qty 100

## 2019-03-14 MED ORDER — HEPARIN BOLUS VIA INFUSION
1300.0000 [IU] | Freq: Once | INTRAVENOUS | Status: AC
  Administered 2019-03-14: 1300 [IU] via INTRAVENOUS
  Filled 2019-03-14: qty 1300

## 2019-03-14 MED ORDER — SENNA 8.6 MG PO TABS
1.0000 | ORAL_TABLET | Freq: Every day | ORAL | Status: DC | PRN
  Administered 2019-03-15: 14:00:00 8.6 mg via ORAL
  Filled 2019-03-14 (×2): qty 1

## 2019-03-14 MED ORDER — FUROSEMIDE 10 MG/ML IJ SOLN
80.0000 mg | Freq: Once | INTRAMUSCULAR | Status: AC
  Administered 2019-03-14: 12:00:00 80 mg via INTRAVENOUS
  Filled 2019-03-14: qty 8

## 2019-03-14 NOTE — Progress Notes (Signed)
Pt has remained alert and oriented to self and place with bouts of confusion. Pain, described as achy, noted to groin today - max 9/10. Dr Mortimer Fries made aware> R groin ART line removed. BM regime started. Pt repositioned as tolerated. Hgb noted 7.0 from 8.1 on 6/5> no palpable hematoma, pt asymptomatic. Heparin D/C. Pt has remained on minimal levo-recently turned off- MAP barely appropriate at 66> will continue to assess.  Pt has transitioned to [facility] from HFNC today-lung sounds diminished to auscultation, SpO2 > 95%. Pt has had a poor appetite-eating about 25% of meals. Safety sitter has remained at bedside. CRRT has remained. Mother has been updated via telephone.

## 2019-03-14 NOTE — Progress Notes (Signed)
[place] [facility] [place] NAME: Glenn Rasmussen    MR#:  [id number]  DATE OF BIRTH:  January 19, 1991  SUBJECTIVE:   -Patient extubated .  Alert and following commands.  Oriented x1-2 -Remains on 1 pressors now  REVIEW OF SYSTEMS:  Review of Systems  Unable to perform ROS: Critical illness    DRUG ALLERGIES:  No Known Allergies  VITALS:  Blood pressure (!) 113/56, pulse (!) 107, temperature 99.1 F (37.3 C), temperature source Axillary, resp. rate (!) 25, height 5\' 7"  (1.702 m), weight 106.7 kg, SpO2 94 %.  PHYSICAL EXAMINATION:  Physical Exam   GENERAL:  28 y.o.-year-old critically ill-appearing patient lying in the bed with no acute distress.  EYES: Pupils equal, round, reactive to light and accommodation. No scleral icterus. Extraocular muscles intact.  HEENT: Head atraumatic, normocephalic. Oropharynx and nasopharynx clear.  NECK:  Supple, no jugular venous distention. No thyroid enlargement, no tenderness.  LUNGS: Normal breath sounds bilaterally, no wheezing, rales,rhonchi or crepitation. No use of accessory muscles of respiration.  Decreased bibasilar breath sounds CARDIOVASCULAR: S1, S2 normal. No murmurs, rubs, or gallops.  ABDOMEN: Soft, nontender, nondistended. Bowel sounds present. No organomegaly or mass.  EXTREMITIES: No  cyanosis, or clubbing.  1+ pedal edema noted NEUROLOGIC: Patient is extubated, he is alert but remains disoriented.  Following simple commands and nodding head appropriately PSYCHIATRIC: The patient is alert and oriented to self SKIN: No obvious rash, lesion, or ulcer.    LABORATORY PANEL:   CBC Recent Labs  Lab 03/14/19 0430  WBC 11.1*  HGB 7.0*  HCT 20.2*  PLT 154   ------------------------------------------------------------------------------------------------------------------  Chemistries  Recent Labs  Lab 03/14/19 0430  03/14/19 1225  NA 138   < > 137  K 3.2*   < > 3.1*  CL 104   < > 102   CO2 24   < > 23  GLUCOSE 100*   < > 118*  BUN 41*   < > 38*  CREATININE 3.05*   < > 3.03*  CALCIUM 8.2*   < > 8.5*  MG 2.2  --   --   AST 38  --   --   ALT 35  --   --   ALKPHOS 39  --   --   BILITOT 0.7  --   --    < > = values in this interval not displayed.   ------------------------------------------------------------------------------------------------------------------  Cardiac Enzymes Recent Labs  Lab 03/14/19 0430  TROPONINI 0.25*   ------------------------------------------------------------------------------------------------------------------  RADIOLOGY:  Ct Head Wo Contrast  Result Date: 03/13/2019 CLINICAL DATA:  Drug overdose EXAM: CT HEAD WITHOUT CONTRAST TECHNIQUE: Contiguous axial images were obtained from the base of the skull through the vertex without intravenous contrast. COMPARISON:  Head CT 12/29/2006 FINDINGS: Brain: There is no mass, hemorrhage or extra-axial collection. The size and configuration of the ventricles and extra-axial CSF spaces are normal. The brain parenchyma is normal, without acute or chronic infarction. Vascular: No abnormal hyperdensity of the major intracranial arteries or dural venous sinuses. No intracranial atherosclerosis. Skull: The visualized skull base, calvarium and extracranial soft tissues are normal. Sinuses/Orbits: No fluid levels or advanced mucosal thickening of the visualized paranasal sinuses. No mastoid or middle ear effusion. The orbits are normal. IMPRESSION: Normal head CT. Electronically Signed   By: Ulyses Jarred M.D.   On: 03/13/2019 02:30   Dg Chest Port 1 View  Result Date: 03/13/2019 CLINICAL DATA:  Acute respiratory failure EXAM: PORTABLE  CHEST 1 VIEW COMPARISON:  03/11/2019 FINDINGS: The left-sided catheter tip projects over the SVC. The heart size is stable from prior study. There is worsening vascular congestion in worsening bilateral airspace opacities. There are likely bilateral pleural effusions. No evidence of  a pneumothorax. The patient has been extubated. Enteric tube has been removed. IMPRESSION: 1. Status post extubation. 2. Remaining lines and tubes as above. 3. Worsening vascular congestion. Persistent small bilateral pleural effusions. Electronically Signed   By: Constance Holster M.D.   On: 03/13/2019 03:46    EKG:   Orders placed or performed during the hospital encounter of 03/08/19  . ED EKG  . ED EKG  . ED EKG  . ED EKG  . EKG 12-Lead  . EKG 12-Lead  . EKG 12-Lead  . EKG 12-Lead  . EKG 12-Lead  . EKG 12-Lead  . EKG 12-Lead  . EKG 12-Lead  . EKG 12-Lead  . EKG 12-Lead  . EKG 12-Lead  . EKG 12-Lead  . EKG 12-Lead  . EKG 12-Lead    ASSESSMENT AND PLAN:   28 year old male with past medical history significant for hypertension, IBS admitted secondary to overdose on Phenergan, losartan, Norvasc and Klonopin  1.  Acute respiratory failure-intubated for airway protection secondary to acute metabolic encephalopathy -Patient is extubated on 03/12/2019, management per ICU team -Alert and following commands.  Oriented x2 with intermittent confusion  2.  Intentional drug overdose -patient overdosed on 38 tablets of Phenergan, 19 tablets of losartan, 42 tablets of Norvasc and 78 tablets of Klonopin -Urine tox positive for benzos and tricyclics -Poison control has been contacted, management per ICU team at this time.  - hypotensive-remains on  -Levophed -On stress dose steroids and also albumin infusion -Appreciate psych consult.  Once patient is medically stable, will need inpatient psychiatry admission.  3.  Acute renal failure-likely ATN from hypotension.  -Started on CRRT, metabolic acidosis.  Appreciate nephrology consult -Continue to monitor urine output-still minimal. -Continue CRRT and follow to see when patient can be converted to hemodialysis  4.   Pneumonia-follow-up procalcitonin and WBC.  Currently on Rocephin -Significantly improving WBC  5.  DVT prophylaxis-on  heparin drip  6.  GERD-Protonix  7.  Elevated troponin-likely demand ischemia, troponin plateaued vs due to renal failure - Started on IV heparin drip.  Most recent echo this admission showing normal LV function and no wall motion abnormalities. -?cardiology consult ?d/c heparin gtt--pt's hgb drifting down   CODE STATUS: Full code  TOTAL TIME TAKING CARE OF THIS PATIENT: 30 minutes.   POSSIBLE D/C IN ? DAYS, DEPENDING ON CLINICAL CONDITION.   Fritzi Mandes M.D on 03/14/2019 at 1:43 PM  Between 7am to 6pm - Pager - [phone]  After 6pm go to [email or web address] - password [place] [facility]  Office  [phone]  CC: Primary care physician; Maeola Sarah, MD

## 2019-03-14 NOTE — Progress Notes (Signed)
[place] [place] for heparin drip management  Indication: chest pain/ACS  No Known Allergies  Patient Measurements: Height: 5' 7.01" (170.2 cm) Weight: 235 lb 3.7 oz (106.7 kg) IBW/kg (Calculated) : 66.12 Heparin Dosing Weight: 92kg    Vital Signs: Temp: 99.1 F (37.3 C) (06/06 0800) Temp Source: Axillary (06/06 0800) BP: 113/56 (06/06 1100) Pulse Rate: 107 (06/06 1200)  Labs: Recent Labs    03/12/19 0146  03/12/19 2302  03/13/19 0441  03/13/19 1732  03/14/19 0430 03/14/19 0803 03/14/19 1200 03/14/19 1225  HGB 9.6*  --   --   --  8.1*  --   --   --  7.0*  --   --   --   HCT 28.1*  --   --   --  23.3*  --   --   --  20.2*  --   --   --   PLT 156  --   --   --  140*  --   --   --  154  --   --   --   HEPARINUNFRC  --   --   --   --   --   --  0.28*  --  0.29*  --  0.29*  --   CREATININE 3.86*   < >  --    < > 3.63*   < > 3.56*   < > 3.05* 3.01*  --  3.03*  TROPONINI  --    < > 0.29*  --  0.27*  --   --   --  0.25*  --   --   --    < > = values in this interval not displayed.    Estimated Creatinine Clearance: 41.5 mL/min (A) (by C-G formula based on SCr of 3.03 mg/dL (H)).   Medical History: Past Medical History:  Diagnosis Date  . Hypertension   . IBS (irritable bowel syndrome)     Medications:  Scheduled:  . Chlorhexidine Gluconate Cloth  6 each Topical Q0600  . hydrocortisone sod succinate (SOLU-CORTEF) inj  50 mg Intravenous Q6H  . insulin aspart  0-15 Units Subcutaneous Q4H  . ipratropium-albuterol  3 mL Nebulization Q6H  . midodrine  5 mg Oral TID WC  . pantoprazole (PROTONIX) IV  40 mg Intravenous Q12H  . sodium chloride flush  10-40 mL Intracatheter Q12H   Infusions:  . albumin human 12.5 g (03/14/19 1046)  . cefTRIAXone (ROCEPHIN)  IV 2 g (03/13/19 1839)  . heparin 1,500 Units/hr (03/14/19 0703)  . norepinephrine (LEVOPHED) Adult infusion 1 mcg/min (03/14/19 1100)  . pureflow 2,500 mL/hr at 03/13/19 1900     Assessment: Pharmacy consulted for heparin drip management for [age] year old male with a medical history as indicated below who presented to the ED after taking 38 tablets of Phenergan, 19 tablets of losartan, 42 tablets of amlodipine, and 78 tablets of clonazepam.   Patient with chest pain overnight and elevated tropinins. Patient previously ordered heparin 5000 units SQ Q8hr.    6/6:  HL @ 0430 = 0.29 Will order heparin 1300 units IV X 1 bolus and increase drip rate to 1500 units/hr.Will recheck HL 6 hrs after rate change.    Goal of Therapy:  Heparin level 0.3-0.7 units/ml Monitor platelets by anticoagulation protocol: Yes   Plan:  6/6 HL @ 1200= 0.29. Will order 1300 unit IV bolus and increase Heparin drip to 1700 units/hr. Will f/u HL in 6 hrs.  Pharmacy will continue to monitor and adjust per consult.   Robbin Loughmiller A 03/14/2019,12:59 PM

## 2019-03-14 NOTE — Consult Note (Signed)
Pharmacy Electrolyte Monitoring Consult:  Pharmacy consulted to assist in monitoring and replacing electrolytes in this      Labs:   Potassium (mmol/L)  Date Value  03/14/2019 3.1 (L)   Magnesium (mg/dL)  Date Value  03/14/2019 2.2   Phosphorus (mg/dL)  Date Value  03/14/2019 2.3 (L)   Calcium (mg/dL)  Date Value  03/14/2019 8.6 (L)   Albumin (g/dL)  Date Value  03/14/2019 3.4 (L)   Corrected Ca: 8.4 mg/dL  Assessment/Plan: 28 year old male with a medical history as indicated below who presented to the ED after taking 38 tablets of Phenergan, 19 tablets of losartan, 42 tablets of amlodipine, and 78 tablets of clonazepam and noted electrolyte abnormalities.  Patient is currently extubated and receiving 2K CRRT bath. MIVF have been discontinued.   6/6 K 3.1 Mag 2.2  Scr 3.02 Was on 2K CRRT . Nephrology following.  Per Nephrology, patient will be stopping CRRT. Will order KCl 68mEq IV x 2 doses. Pharmacy will continue to follow along with scheduled renal function panels scheduled Q4hr.   Pharmacy will continue to monitor and adjust per consult.

## 2019-03-14 NOTE — Progress Notes (Signed)
CRRT discontinued.

## 2019-03-14 NOTE — Progress Notes (Signed)
CRITICAL CARE NOTE  CC  follow up respiratory failure  SUBJECTIVE Patient remains critically ill Prognosis is guarded On CRRT High risk for re-intubation Lethargic On pressors   BP (!) 109/53   Pulse 99   Temp 98.1 F (36.7 C) (Oral)   Resp (!) 21   Ht 5' 7.01" (1.702 m)   Wt 106.7 kg   SpO2 98%   BMI 36.83 kg/m    I/O last 3 completed shifts: In: 2219.5 [I.V.:1955.8; IV Piggyback:263.7] Out: 2212 [Urine:60; Other:2152] No intake/output data recorded.  SpO2: 98 % O2 Flow Rate (L/min): 35 L/min FiO2 (%): 35 %   SIGNIFICANT EVENTS 5/31 admitted for severe shock and resp failure DRUG OD 6/1 started CRRT multiorgan failure, resp failure, vasopressors 6/2 severe hypoxia UF rates increased to 500 6/3 DNR, multiorgan failure 6/4 CODE STATUS changed to full code, high risk for reintubation severe hypoxia on CRRT 6/5 high flow [place], on CRRT, high risk for intubation   REVIEW OF SYSTEMS  PATIENT IS UNABLE TO PROVIDE COMPLETE REVIEW OF SYSTEMS DUE TO SEVERE CRITICAL ILLNESS   PHYSICAL EXAMINATION:  GENERAL:critically ill appearing, +resp distress HEAD: Normocephalic, atraumatic.  EYES: Pupils equal, round, reactive to light.  No scleral icterus.  MOUTH: Moist mucosal membrane. NECK: Supple. No thyromegaly. No nodules. No JVD.  PULMONARY: +rhonchi,  CARDIOVASCULAR: S1 and S2. Regular rate and rhythm. No murmurs, rubs, or gallops.  GASTROINTESTINAL: Soft, nontender, -distended. No masses. Positive bowel sounds. No hepatosplenomegaly.  MUSCULOSKELETAL: No swelling, clubbing, or edema.  NEUROLOGIC: lethargic SKIN:intact,warm,dry  MEDICATIONS: I have reviewed all medications and confirmed regimen as documented   CULTURE RESULTS   Recent Results (from the past 240 hour(s))  SARS Coronavirus 2 (CEPHEID - Performed in [place] hospital lab), Hosp Order     Status: None   Collection Time: 03/08/19 11:48 PM  Result Value Ref Range Status   SARS Coronavirus 2  NEGATIVE NEGATIVE Final    Comment: (NOTE) If result is NEGATIVE SARS-CoV-2 target nucleic acids are NOT DETECTED. The SARS-CoV-2 RNA is generally detectable in upper and lower  respiratory specimens during the acute phase of infection. The lowest  concentration of SARS-CoV-2 viral copies this assay can detect is 250  copies / mL. A negative result does not preclude SARS-CoV-2 infection  and should not be used as the sole basis for treatment or other  patient management decisions.  A negative result may occur with  improper specimen collection / handling, submission of specimen other  than nasopharyngeal swab, presence of viral mutation(s) within the  areas targeted by this assay, and inadequate number of viral copies  (<250 copies / mL). A negative result must be combined with clinical  observations, patient history, and epidemiological information. If result is POSITIVE SARS-CoV-2 target nucleic acids are DETECTED. The SARS-CoV-2 RNA is generally detectable in upper and lower  respiratory specimens dur ing the acute phase of infection.  Positive  results are indicative of active infection with SARS-CoV-2.  Clinical  correlation with patient history and other diagnostic information is  necessary to determine patient infection status.  Positive results do  not rule out bacterial infection or co-infection with other viruses. If result is PRESUMPTIVE POSTIVE SARS-CoV-2 nucleic acids MAY BE PRESENT.   A presumptive positive result was obtained on the submitted specimen  and confirmed on repeat testing.  While 2019 novel coronavirus  (SARS-CoV-2) nucleic acids may be present in the submitted sample  additional confirmatory testing may be necessary for epidemiological  and /  or clinical management purposes  to differentiate between  SARS-CoV-2 and other Sarbecovirus currently known to infect humans.  If clinically indicated additional testing with an alternate test  methodology [phone])  is advised. The SARS-CoV-2 RNA is generally  detectable in upper and lower respiratory sp ecimens during the acute  phase of infection. The expected result is Negative. Fact Sheet for Patients:  StrictlyIdeas.no Fact Sheet for Healthcare Providers: BankingDealers.co.za This test is not yet approved or cleared by the [place] FDA and has been authorized for detection and/or diagnosis of SARS-CoV-2 by FDA under an Emergency Use Authorization (EUA).  This EUA will remain in effect (meaning this test can be used) for the duration of the COVID-19 declaration under Section 564(b)(1) of the Act, 21 U.S.C. section 360bbb-3(b)(1), unless the authorization is terminated or revoked sooner. Performed at [facility], [address] [place], [place] [address]   MRSA PCR Screening     Status: None   Collection Time: 03/09/19  2:48 AM  Result Value Ref Range Status   MRSA by PCR NEGATIVE NEGATIVE Final    Comment:        The GeneXpert MRSA Assay (FDA approved for NASAL specimens only), is one component of a comprehensive MRSA colonization surveillance program. It is not intended to diagnose MRSA infection nor to guide or monitor treatment for MRSA infections. Performed at [facility], [address] [place], [place] [address]   Culture, respiratory (non-expectorated)     Status: None   Collection Time: 03/09/19  8:00 AM  Result Value Ref Range Status   Specimen Description   Final    TRACHEAL ASPIRATE Performed at [facility], [address] [place], [place] [address]    Special Requests   Final    NONE Performed at [facility], [address] [place], [place] [address]    Gram Stain   Final    ABUNDANT WBC PRESENT,BOTH PMN AND MONONUCLEAR MODERATE GRAM POSITIVE COCCI RARE YEAST    Culture   Final    MODERATE GROUP B STREP(S.AGALACTIAE)ISOLATED TESTING AGAINST S. AGALACTIAE NOT  ROUTINELY PERFORMED DUE TO PREDICTABILITY OF AMP/PEN/VAN SUSCEPTIBILITY. Performed at [facility], [place] [address], [place], [place] [address]    Report Status 03/11/2019 FINAL  Final  CULTURE, BLOOD (ROUTINE X 2) w Reflex to ID Panel     Status: None (Preliminary result)   Collection Time: 03/09/19  2:53 PM  Result Value Ref Range Status   Specimen Description BLOOD BLOOD RIGHT ARM  Final   Special Requests   Final    BOTTLES DRAWN AEROBIC AND ANAEROBIC Blood Culture results may not be optimal due to an excessive volume of blood received in culture bottles   Culture  Setup Time PENDING  Incomplete   Culture   Final    NO GROWTH 5 DAYS Performed at [facility], [address] [place],  [address]    Report Status PENDING  Incomplete  CULTURE, BLOOD (ROUTINE X 2) w Reflex to ID Panel     Status: None (Preliminary result)   Collection Time: 03/09/19  5:00 PM  Result Value Ref Range Status   Specimen Description BLOOD RIGHT ARM  Final   Special Requests   Final    BOTTLES DRAWN AEROBIC AND ANAEROBIC Blood Culture results may not be optimal due to an excessive volume of blood received in culture bottles   Culture  Setup Time PENDING  Incomplete   Culture   Final  NO GROWTH 5 DAYS Performed at [facility], [address] [place], [place] [address]    Report Status PENDING  Incomplete  Urine Culture     Status: None   Collection Time: 03/11/19  4:33 AM  Result Value Ref Range Status   Specimen Description   Final    URINE, RANDOM Performed at [facility], [address], [place], [place] [address]    Special Requests   Final    NONE Performed at [facility], [address] [place], [place] [address]    Culture   Final    NO GROWTH Performed at [facility], [place] [address] [place],  [address]    Report Status 03/12/2019 FINAL  Final        CBC    Component Value Date/Time   WBC 11.1 (H) 03/14/2019  0430   RBC 2.58 (L) 03/14/2019 0430   HGB 7.0 (L) 03/14/2019 0430   HCT 20.2 (L) 03/14/2019 0430   PLT 154 03/14/2019 0430   MCV 78.3 (L) 03/14/2019 0430   MCH 27.1 03/14/2019 0430   MCHC 34.7 03/14/2019 0430   RDW 12.9 03/14/2019 0430   LYMPHSABS 2.5 03/10/2019 0431   MONOABS 3.0 (H) 03/10/2019 0431   EOSABS 0.0 03/10/2019 0431   BASOSABS 0.1 03/10/2019 0431   BMP Latest Ref Rng & Units 03/14/2019 03/13/2019 03/13/2019  Glucose 70 - 99 mg/dL 100(H) 110(H) 124(H)  BUN 6 - 20 mg/dL 41(H) 43(H) 44(H)  Creatinine 0.61 - 1.24 mg/dL 3.05(H) 3.32(H) 3.29(H)  Sodium 135 - 145 mmol/L 138 139 137  Potassium 3.5 - 5.1 mmol/L 3.2(L) 3.3(L) 3.3(L)  Chloride 98 - 111 mmol/L 104 105 105  CO2 22 - 32 mmol/L 24 21(L) 21(L)  Calcium 8.9 - 10.3 mg/dL 8.2(L) 8.4(L) 8.0(L)       Indwelling Urinary Catheter continued, requirement due to   Reason to continue Indwelling Urinary Catheter strict Intake/Output monitoring for hemodynamic instability   Central Line/ continued, requirement due to  Reason to continue [place] of central venous pressure or other hemodynamic parameters and poor IV access        ASSESSMENT AND PLAN SYNOPSIS  28 year old white male admitted to the ICU for acute drug overdose and toxicity  from calcium channel blocker in the setting of multiorgan failure with severehypoxicrespiratory failure,distributive shock and renal failure with metabolic encephalopathy  Severe ACUTE Hypoxic and Hypercapnic Respiratory Failure Patient was extubated several days ago however has severe hypoxia on high flow nasal cannula  Most likely related to pulmonary edema and aspiration pneumonitis and group B strep pneumonia Patient is a very high risk for reintubation    ACUTE KIDNEY INJURY/Renal Failure -follow chem 7 -follow UO -continue Foley Catheter-assess need -Avoid nephrotoxic agents -Recheck creatinine  Continue CRRT   NEUROLOGY Pain and agitation Follow up  psych recs   SHOCK-SEPSIS/HYPOVOLUMIC/DISTRIBUTIVE -use vasopressors to keep MAP>65 -stress dose steroids   CARDIAC ICU monitoring  ID -continue IV abx as prescibed -follow up cultures  GI GI PROPHYLAXIS as indicated  NUTRITIONAL STATUS DIET-->as tolerated Constipation protocol as indicated  ENDO - will use ICU hypoglycemic\Hyperglycemia protocol if indicated   ELECTROLYTES -follow labs as needed -replace as needed -pharmacy consultation and following   DVT/GI PRX ordered TRANSFUSIONS AS NEEDED MONITOR FSBS ASSESS the need for LABS as needed   Critical Care Time devoted to patient care services described in this note is 34 minutes.   Overall, patient is critically ill, prognosis is  guarded.  Patient with Multiorgan failure and at high risk for cardiac arrest and death.    Corrin Parker, M.D.  [facility]  Medical Director [place] Director [facility] Cardio-Pulmonary Department

## 2019-03-14 NOTE — Consult Note (Signed)
Pharmacy Electrolyte Monitoring Consult:  Pharmacy consulted to assist in monitoring and replacing electrolytes in this      Labs:   Potassium (mmol/L)  Date Value  03/14/2019 3.2 (L)   Magnesium (mg/dL)  Date Value  03/14/2019 2.2   Phosphorus (mg/dL)  Date Value  03/13/2019 3.0   Calcium (mg/dL)  Date Value  03/14/2019 8.2 (L)   Albumin (g/dL)  Date Value  03/14/2019 3.1 (L)   Corrected Ca: 8.4 mg/dL  Assessment/Plan: 28 year old male with a medical history as indicated below who presented to the ED after taking 38 tablets of Phenergan, 19 tablets of losartan, 42 tablets of amlodipine, and 78 tablets of clonazepam and noted electrolyte abnormalities.  Patient is currently extubated and receiving 2K CRRT bath. MIVF have been discontinued.   6/6 K 3.2 Mag 2.2  Scr 3.05  On 2K CRRT . Nephrology following. No further adjustments warranted at this time. Pharmacy will continue to follow along with scheduled renal function panels scheduled Q4hr.   Pharmacy will continue to monitor and adjust per consult.

## 2019-03-14 NOTE — Consult Note (Signed)
[facility] Consult   Reason for Consult: Suicide attempt by overdose Referring Physician: ICU provider Patient Identification: Glenn Rasmussen MRN:  [id number] Principal Diagnosis: Intentional overdose Diagnosis:  Active Problems:   MDD (major depressive disorder), recurrent severe, without psychosis ([place])   Multiple drug overdose   Suicide attempt ([place])   HTN (hypertension)   AKI (acute kidney injury) ([place])   Acute respiratory failure ([place])   Antihypertensive agent overdose   Calcium channel blocker overdose  Patient is seen, chart is reviewed.    Total Time spent with patient: 15 minutes  Subjective:  "Pretty better today."  HPI: On admission to ED: Glenn Rasmussen is a 28 y.o. male patient admitted with drug overdose and suicide attempt.  Patient presents to the ED reporting multiple drug overdose.  He states that he is depressed and "just does not care about life anymore".  He states that he took a number of medications tonight including benzodiazepine, calcium channel blocker, Phenergan, amlodipine, and "just did not care what happened".  He is currently hypotensive, though awake and alert.  Treatment for his overdose was initiated in the ED and hospitalist were called for admission.Glenn Rasmussen  Psychiatry consult is requested for evaluation and further management  Past Psychiatric History: Per medication review, patient appears to be treated for ADHD, anxiety, and insomnia.  On evaluation 03/12/19, patient has recently been extubated.  His mother is at his bedside.  He is coordinating a video phone conference with his brother.  Patient is smiling, and expressing gratitude to have survived.  He endorses that he did have a suicide attempt by drug overdose.  Patient is currently denying suicidal ideation, plan or intent.  He denies HI.  He denies AVH.  Discussed with patient and mother plan for patient to medically stabilize while in the ICU, and then transferred to inpatient  psychiatry.  Patient is agreeable to this plan.  03/13/19:  Patient seen and evaluated.  He wanted his mother to stay at the bedside.  She was attentive to him and worked to reorient him.  He was visible anxious with tube in nose and IVs in his neck.  Pleasant and smiled frequently but confused with the time and situation.  He could reorient with assistance.  Agreeable to inpatient psychiatry when medically stable.  Difficult to get more than a basic assessment due to his anxiety, medical condition, and confusion.  Mother was helpful and hoping to stay with him if the hospital agreed and she could get a recliner.  If not she would stay until she had to go to bed.  03/14/19:  Patient seen and evaluated.  He is less anxious today and less confused than yesterday.  Focused on current medical situation with tubes and PICC line in his neck.  He is waiting for his mother to come visit.  Pleasant and understands his medical issues will be addressed prior to his depression.  Risk to Self:  Yes Risk to Others:  Not known Prior Inpatient Therapy:  Not known Prior Outpatient Therapy:  Not known  Past Medical History:  Past Medical History:  Diagnosis Date  . Hypertension   . IBS (irritable bowel syndrome)     Past Surgical History:  Procedure Laterality Date  . APPENDECTOMY    . CHOLECYSTECTOMY    . TONSILLECTOMY     Family History:  Family History  Problem Relation Age of Onset  . Lung cancer Mother   . Lung cancer Father    Family  Psychiatric  History: Unknown  Social History:  Social History   Substance and Sexual Activity  Alcohol Use No  . Frequency: Never     Social History   Substance and Sexual Activity  Drug Use No    Social History   Socioeconomic History  . Marital status: Single    Spouse name: Not on file  . Number of children: Not on file  . Years of education: Not on file  . Highest education level: Not on file  Occupational History  . Not on file  Social Needs   . Financial resource strain: Not on file  . Food insecurity:    Worry: Not on file    Inability: Not on file  . Transportation needs:    Medical: Not on file    Non-medical: Not on file  Tobacco Use  . Smoking status: Never Smoker  . Smokeless tobacco: Never Used  Substance and Sexual Activity  . Alcohol use: No    Frequency: Never  . Drug use: No  . Sexual activity: Not on file  Lifestyle  . Physical activity:    Days per week: Not on file    Minutes per session: Not on file  . Stress: Not on file  Relationships  . Social connections:    Talks on phone: Not on file    Gets together: Not on file    Attends religious service: Not on file    Active member of club or organization: Not on file    Attends meetings of clubs or organizations: Not on file    Relationship status: Not on file  Other Topics Concern  . Not on file  Social History Narrative  . Not on file   Additional Social History:    Unknown  Allergies:  No Known Allergies  Labs:  Results for orders placed or performed during the hospital encounter of 03/08/19 (from the past 48 hour(s))  Renal function panel     Status: Abnormal   Collection Time: 03/12/19  5:04 PM  Result Value Ref Range   Sodium 135 135 - 145 mmol/L   Potassium 4.3 3.5 - 5.1 mmol/L   Chloride 102 98 - 111 mmol/L   CO2 23 22 - 32 mmol/L   Glucose, Bld 182 (H) 70 - 99 mg/dL   BUN 38 (H) 6 - 20 mg/dL   Creatinine, Ser 3.73 (H) 0.61 - 1.24 mg/dL   Calcium 8.2 (L) 8.9 - 10.3 mg/dL   Phosphorus 5.2 (H) 2.5 - 4.6 mg/dL   Albumin 2.9 (L) 3.5 - 5.0 g/dL   GFR calc non Af Amer 20 (L) >60 mL/min   GFR calc Af Amer 24 (L) >60 mL/min   Anion gap 10 5 - 15    Comment: Performed at [facility], [address] [place], [place] [address]  Glucose, capillary     Status: Abnormal   Collection Time: 03/12/19  5:14 PM  Result Value Ref Range   Glucose-Capillary 178 (H) 70 - 99 mg/dL  Troponin I - Once     Status: Abnormal   Collection  Time: 03/12/19  6:33 PM  Result Value Ref Range   Troponin I 0.24 (HH) <0.03 ng/mL    Comment: CRITICAL RESULT CALLED TO, READ BACK BY AND VERIFIED WITH SANDRA BORBA @1858  03/12/19 MJU Performed at [facility], [address] [place], [place] [address]   Glucose, capillary     Status: Abnormal   Collection Time: 03/12/19  8:00 PM  Result Value Ref Range   Glucose-Capillary 163 (H) 70 - 99 mg/dL   Comment 1 Notify RN    Comment 2 Document in Chart   Renal function panel     Status: Abnormal   Collection Time: 03/12/19  8:42 PM  Result Value Ref Range   Sodium 138 135 - 145 mmol/L   Potassium 4.3 3.5 - 5.1 mmol/L   Chloride 104 98 - 111 mmol/L   CO2 21 (L) 22 - 32 mmol/L   Glucose, Bld 164 (H) 70 - 99 mg/dL   BUN 40 (H) 6 - 20 mg/dL   Creatinine, Ser 3.69 (H) 0.61 - 1.24 mg/dL   Calcium 8.2 (L) 8.9 - 10.3 mg/dL   Phosphorus 4.7 (H) 2.5 - 4.6 mg/dL   Albumin 3.1 (L) 3.5 - 5.0 g/dL   GFR calc non Af Amer 21 (L) >60 mL/min   GFR calc Af Amer 24 (L) >60 mL/min   Anion gap 13 5 - 15    Comment: Performed at [facility], [address] [place], [place] [address]  Troponin I - Once     Status: Abnormal   Collection Time: 03/12/19 11:02 PM  Result Value Ref Range   Troponin I 0.29 (HH) <0.03 ng/mL    Comment: CRITICAL VALUE NOTED. VALUE IS CONSISTENT WITH PREVIOUSLY REPORTED/CALLED VALUE. QSD Performed at [facility], [address] [place], [place] [address]   Glucose, capillary     Status: Abnormal   Collection Time: 03/13/19 12:05 AM  Result Value Ref Range   Glucose-Capillary 154 (H) 70 - 99 mg/dL   Comment 1 Notify RN    Comment 2 Document in Chart   Renal function panel     Status: Abnormal   Collection Time: 03/13/19 12:47 AM  Result Value Ref Range   Sodium 137 135 - 145 mmol/L   Potassium 4.0 3.5 - 5.1 mmol/L   Chloride 103 98 - 111 mmol/L   CO2 21 (L) 22 - 32 mmol/L   Glucose, Bld 168 (H) 70 - 99 mg/dL   BUN 40 (H) 6 - 20 mg/dL    Creatinine, Ser 3.48 (H) 0.61 - 1.24 mg/dL   Calcium 8.0 (L) 8.9 - 10.3 mg/dL   Phosphorus 4.2 2.5 - 4.6 mg/dL   Albumin 3.1 (L) 3.5 - 5.0 g/dL   GFR calc non Af Amer 22 (L) >60 mL/min   GFR calc Af Amer 26 (L) >60 mL/min   Anion gap 13 5 - 15    Comment: Performed at [facility], [address] [place], [place] [address]  Glucose, capillary     Status: Abnormal   Collection Time: 03/13/19  3:49 AM  Result Value Ref Range   Glucose-Capillary 148 (H) 70 - 99 mg/dL  Renal function panel     Status: Abnormal   Collection Time: 03/13/19  4:41 AM  Result Value Ref Range   Sodium 136 135 - 145 mmol/L   Potassium 3.8 3.5 - 5.1 mmol/L   Chloride 104 98 - 111 mmol/L   CO2 21 (L) 22 - 32 mmol/L   Glucose, Bld 159 (H) 70 - 99 mg/dL   BUN 43 (H) 6 - 20 mg/dL   Creatinine, Ser 3.63 (H) 0.61 - 1.24 mg/dL   Calcium 8.0 (L) 8.9 - 10.3 mg/dL   Phosphorus 4.2 2.5 - 4.6 mg/dL   Albumin 3.0 (L) 3.5 - 5.0 g/dL   GFR calc non Af Amer 21 (L) >60 mL/min  GFR calc Af Amer 25 (L) >60 mL/min   Anion gap 11 5 - 15    Comment: Performed at [facility], [address] [place], [place] [address]  Magnesium     Status: None   Collection Time: 03/13/19  4:41 AM  Result Value Ref Range   Magnesium 2.3 1.7 - 2.4 mg/dL    Comment: Performed at [facility], [address] [place], [place] [address]  CBC     Status: Abnormal   Collection Time: 03/13/19  4:41 AM  Result Value Ref Range   WBC 17.1 (H) 4.0 - 10.5 K/uL   RBC 2.97 (L) 4.22 - 5.81 MIL/uL   Hemoglobin 8.1 (L) 13.0 - 17.0 g/dL   HCT 23.3 (L) 39.0 - 52.0 %   MCV 78.5 (L) 80.0 - 100.0 fL   MCH 27.3 26.0 - 34.0 pg   MCHC 34.8 30.0 - 36.0 g/dL   RDW 12.4 11.5 - 15.5 %   Platelets 140 (L) 150 - 400 K/uL   nRBC 0.0 0.0 - 0.2 %    Comment: Performed at [facility], [address] [place], [place] [address]  Troponin I - Once-Timed     Status: Abnormal   Collection Time: 03/13/19  4:41 AM  Result Value  Ref Range   Troponin I 0.27 (HH) <0.03 ng/mL    Comment: CRITICAL VALUE NOTED. VALUE IS CONSISTENT WITH PREVIOUSLY REPORTED/CALLED VALUE. QSD Performed at [facility], [address] [place], [place] [address]   Lipid panel     Status: Abnormal   Collection Time: 03/13/19  4:41 AM  Result Value Ref Range   Cholesterol 154 0 - 200 mg/dL   Triglycerides 258 (H) <150 mg/dL   HDL 32 (L) >40 mg/dL   Total CHOL/HDL Ratio 4.8 RATIO   VLDL 52 (H) 0 - 40 mg/dL   LDL Cholesterol 70 0 - 99 mg/dL    Comment:        Total Cholesterol/HDL:CHD Risk Coronary Heart Disease Risk Table                     Men   Women  1/2 Average Risk   3.4   3.3  Average Risk       5.0   4.4  2 X Average Risk   9.6   7.1  3 X Average Risk  23.4   11.0        Use the calculated Patient Ratio above and the CHD Risk Table to determine the patient's CHD Risk.        ATP III CLASSIFICATION (LDL):  <100     mg/dL   Optimal  100-129  mg/dL   Near or Above                    Optimal  130-159  mg/dL   Borderline  160-189  mg/dL   High  >190     mg/dL   Very High Performed at [facility], [address] [place], [place] [address]   Hemoglobin A1c     Status: None   Collection Time: 03/13/19  4:41 AM  Result Value Ref Range   Hgb A1c MFr Bld 5.5 4.8 - 5.6 %    Comment: (NOTE)         Prediabetes: 5.7 - 6.4         Diabetes: >6.4         Glycemic control for  adults with diabetes: <7.0    Mean Plasma Glucose 111 mg/dL    Comment: (NOTE) Performed At: [facility] [place], [place] [id number] Rush Farmer MD EY:[id number]   Glucose, capillary     Status: Abnormal   Collection Time: 03/13/19  8:34 AM  Result Value Ref Range   Glucose-Capillary 155 (H) 70 - 99 mg/dL  Glucose, capillary     Status: Abnormal   Collection Time: 03/13/19 11:53 AM  Result Value Ref Range   Glucose-Capillary 124 (H) 70 - 99 mg/dL  Renal function panel     Status: Abnormal    Collection Time: 03/13/19 11:55 AM  Result Value Ref Range   Sodium 137 135 - 145 mmol/L   Potassium 3.7 3.5 - 5.1 mmol/L   Chloride 104 98 - 111 mmol/L   CO2 21 (L) 22 - 32 mmol/L   Glucose, Bld 134 (H) 70 - 99 mg/dL   BUN 46 (H) 6 - 20 mg/dL   Creatinine, Ser 3.83 (H) 0.61 - 1.24 mg/dL   Calcium 8.0 (L) 8.9 - 10.3 mg/dL   Phosphorus 3.6 2.5 - 4.6 mg/dL   Albumin 3.2 (L) 3.5 - 5.0 g/dL   GFR calc non Af Amer 20 (L) >60 mL/min   GFR calc Af Amer 23 (L) >60 mL/min   Anion gap 12 5 - 15    Comment: Performed at [facility], [address] [place], [place] [address]  Glucose, capillary     Status: Abnormal   Collection Time: 03/13/19  3:39 PM  Result Value Ref Range   Glucose-Capillary 115 (H) 70 - 99 mg/dL  Renal function panel     Status: Abnormal   Collection Time: 03/13/19  5:32 PM  Result Value Ref Range   Sodium 135 135 - 145 mmol/L   Potassium 3.4 (L) 3.5 - 5.1 mmol/L   Chloride 102 98 - 111 mmol/L   CO2 20 (L) 22 - 32 mmol/L   Glucose, Bld 126 (H) 70 - 99 mg/dL   BUN 45 (H) 6 - 20 mg/dL   Creatinine, Ser 3.56 (H) 0.61 - 1.24 mg/dL   Calcium 8.0 (L) 8.9 - 10.3 mg/dL   Phosphorus 3.4 2.5 - 4.6 mg/dL   Albumin 3.3 (L) 3.5 - 5.0 g/dL   GFR calc non Af Amer 22 (L) >60 mL/min   GFR calc Af Amer 25 (L) >60 mL/min   Anion gap 13 5 - 15    Comment: Performed at [facility], [address] [place], [place] [address]  Heparin level (unfractionated)     Status: Abnormal   Collection Time: 03/13/19  5:32 PM  Result Value Ref Range   Heparin Unfractionated 0.28 (L) 0.30 - 0.70 IU/mL    Comment: (NOTE) If heparin results are below expected values, and patient dosage has  been confirmed, suggest follow up testing of antithrombin III levels. Performed at [facility], [address] [place], [place] [address]   Glucose, capillary     Status: Abnormal   Collection Time: 03/13/19  7:50 PM  Result Value Ref Range   Glucose-Capillary 120 (H) 70 -  99 mg/dL  Renal function panel     Status: Abnormal   Collection Time: 03/13/19  7:51 PM  Result Value Ref Range   Sodium 137 135 - 145 mmol/L   Potassium 3.3 (L) 3.5 - 5.1 mmol/L   Chloride 105 98 - 111 mmol/L   CO2 21 (L) 22 - 32 mmol/L  Glucose, Bld 124 (H) 70 - 99 mg/dL   BUN 44 (H) 6 - 20 mg/dL   Creatinine, Ser 3.29 (H) 0.61 - 1.24 mg/dL   Calcium 8.0 (L) 8.9 - 10.3 mg/dL   Phosphorus 3.4 2.5 - 4.6 mg/dL   Albumin 3.2 (L) 3.5 - 5.0 g/dL   GFR calc non Af Amer 24 (L) >60 mL/min   GFR calc Af Amer 28 (L) >60 mL/min   Anion gap 11 5 - 15    Comment: Performed at [facility], [address] [place], [place] [address]  Glucose, capillary     Status: None   Collection Time: 03/13/19 11:35 PM  Result Value Ref Range   Glucose-Capillary 99 70 - 99 mg/dL  Renal function panel     Status: Abnormal   Collection Time: 03/13/19 11:36 PM  Result Value Ref Range   Sodium 139 135 - 145 mmol/L   Potassium 3.3 (L) 3.5 - 5.1 mmol/L   Chloride 105 98 - 111 mmol/L   CO2 21 (L) 22 - 32 mmol/L   Glucose, Bld 110 (H) 70 - 99 mg/dL   BUN 43 (H) 6 - 20 mg/dL   Creatinine, Ser 3.32 (H) 0.61 - 1.24 mg/dL   Calcium 8.4 (L) 8.9 - 10.3 mg/dL   Phosphorus 3.0 2.5 - 4.6 mg/dL   Albumin 3.4 (L) 3.5 - 5.0 g/dL   GFR calc non Af Amer 24 (L) >60 mL/min   GFR calc Af Amer 27 (L) >60 mL/min   Anion gap 13 5 - 15    Comment: Performed at [facility], [address] [place], [place] [address]  Blood gas, arterial     Status: Abnormal   Collection Time: 03/13/19 11:51 PM  Result Value Ref Range   FIO2 0.45    Delivery systems HI FLOW NASAL CANNULA    pH, Arterial 7.46 (H) 7.350 - 7.450   pCO2 arterial 34 32.0 - 48.0 mmHg   pO2, Arterial 67 (L) 83.0 - 108.0 mmHg   Bicarbonate 24.2 20.0 - 28.0 mmol/L   Acid-Base Excess 0.8 0.0 - 2.0 mmol/L   O2 Saturation 94.1 %   Patient temperature 37.0    Collection site A-LINE    Sample type ARTERIAL DRAW    Allens test (pass/fail) PASS  PASS    Comment: Performed at [facility], [address] [place], [place] [address]  Magnesium     Status: None   Collection Time: 03/14/19  4:30 AM  Result Value Ref Range   Magnesium 2.2 1.7 - 2.4 mg/dL    Comment: Performed at [facility], [address] [place], [place] [address]  CBC     Status: Abnormal   Collection Time: 03/14/19  4:30 AM  Result Value Ref Range   WBC 11.1 (H) 4.0 - 10.5 K/uL   RBC 2.58 (L) 4.22 - 5.81 MIL/uL   Hemoglobin 7.0 (L) 13.0 - 17.0 g/dL   HCT 20.2 (L) 39.0 - 52.0 %   MCV 78.3 (L) 80.0 - 100.0 fL   MCH 27.1 26.0 - 34.0 pg   MCHC 34.7 30.0 - 36.0 g/dL   RDW 12.9 11.5 - 15.5 %   Platelets 154 150 - 400 K/uL   nRBC 0.3 (H) 0.0 - 0.2 %    Comment: Performed at [facility], [address] [place], [place] [address]  Comprehensive metabolic panel     Status: Abnormal   Collection Time: 03/14/19  4:30 AM  Result Value Ref Range   Sodium 138 135 - 145 mmol/L   Potassium 3.2 (L) 3.5 - 5.1 mmol/L   Chloride 104 98 - 111 mmol/L   CO2 24 22 - 32 mmol/L   Glucose, Bld 100 (H) 70 - 99 mg/dL   BUN 41 (H) 6 - 20 mg/dL   Creatinine, Ser 3.05 (H) 0.61 - 1.24 mg/dL   Calcium 8.2 (L) 8.9 - 10.3 mg/dL   Total Protein 5.6 (L) 6.5 - 8.1 g/dL   Albumin 3.1 (L) 3.5 - 5.0 g/dL   AST 38 15 - 41 U/L   ALT 35 0 - 44 U/L   Alkaline Phosphatase 39 38 - 126 U/L   Total Bilirubin 0.7 0.3 - 1.2 mg/dL   GFR calc non Af Amer 26 (L) >60 mL/min   GFR calc Af Amer 30 (L) >60 mL/min   Anion gap 10 5 - 15    Comment: Performed at [facility], [address] [place], [place] [address]  Troponin I - Tomorrow AM 0500     Status: Abnormal   Collection Time: 03/14/19  4:30 AM  Result Value Ref Range   Troponin I 0.25 (HH) <0.03 ng/mL    Comment: CRITICAL VALUE NOTED. VALUE IS CONSISTENT WITH PREVIOUSLY REPORTED/CALLED VALUE RWW Performed at [facility], [place], [place] [address]   Heparin level  (unfractionated)     Status: Abnormal   Collection Time: 03/14/19  4:30 AM  Result Value Ref Range   Heparin Unfractionated 0.29 (L) 0.30 - 0.70 IU/mL    Comment: (NOTE) If heparin results are below expected values, and patient dosage has  been confirmed, suggest follow up testing of antithrombin III levels. Performed at [facility], [address] [place], [place] [address]   Glucose, capillary     Status: Abnormal   Collection Time: 03/14/19  4:36 AM  Result Value Ref Range   Glucose-Capillary 101 (H) 70 - 99 mg/dL  Glucose, capillary     Status: None   Collection Time: 03/14/19  7:37 AM  Result Value Ref Range   Glucose-Capillary 94 70 - 99 mg/dL  Renal function panel     Status: Abnormal   Collection Time: 03/14/19  8:03 AM  Result Value Ref Range   Sodium 138 135 - 145 mmol/L   Potassium 3.5 3.5 - 5.1 mmol/L   Chloride 104 98 - 111 mmol/L   CO2 21 (L) 22 - 32 mmol/L   Glucose, Bld 118 (H) 70 - 99 mg/dL   BUN 39 (H) 6 - 20 mg/dL   Creatinine, Ser 3.01 (H) 0.61 - 1.24 mg/dL   Calcium 8.5 (L) 8.9 - 10.3 mg/dL   Phosphorus 2.7 2.5 - 4.6 mg/dL   Albumin 3.4 (L) 3.5 - 5.0 g/dL   GFR calc non Af Amer 27 (L) >60 mL/min   GFR calc Af Amer 31 (L) >60 mL/min   Anion gap 13 5 - 15    Comment: Performed at [facility], [address] [place], [place] [address]  Glucose, capillary     Status: None   Collection Time: 03/14/19 11:49 AM  Result Value Ref Range   Glucose-Capillary 95 70 - 99 mg/dL  Heparin level (unfractionated)     Status: Abnormal   Collection Time: 03/14/19 12:00 PM  Result Value Ref Range   Heparin Unfractionated 0.29 (L) 0.30 - 0.70 IU/mL    Comment: (NOTE) If heparin results are below expected values, and  patient dosage has  been confirmed, suggest follow up testing of antithrombin III levels. Performed at [facility], [address] [place], [place] [address]   Renal function panel     Status: Abnormal   Collection Time:  03/14/19 12:25 PM  Result Value Ref Range   Sodium 137 135 - 145 mmol/L   Potassium 3.1 (L) 3.5 - 5.1 mmol/L   Chloride 102 98 - 111 mmol/L   CO2 23 22 - 32 mmol/L   Glucose, Bld 118 (H) 70 - 99 mg/dL   BUN 38 (H) 6 - 20 mg/dL   Creatinine, Ser 3.03 (H) 0.61 - 1.24 mg/dL   Calcium 8.5 (L) 8.9 - 10.3 mg/dL   Phosphorus 2.4 (L) 2.5 - 4.6 mg/dL   Albumin 3.7 3.5 - 5.0 g/dL   GFR calc non Af Amer 26 (L) >60 mL/min   GFR calc Af Amer 31 (L) >60 mL/min   Anion gap 12 5 - 15    Comment: Performed at [facility], [address] [place], [place] [address]    Current Facility-Administered Medications  Medication Dose Route Frequency Provider Last Rate Last Dose  . albumin human 25 % solution 12.5 g  12.5 g Intravenous BID Murlean Iba, MD 60 mL/hr at 03/14/19 1046 12.5 g at 03/14/19 1046  . bisacodyl (DULCOLAX) suppository 10 mg  10 mg Rectal Daily PRN Tukov-Yual, Magdalene S, NP      . cefTRIAXone (ROCEPHIN) 2 g in sodium chloride 0.9 % 100 mL IVPB  2 g Intravenous Q24H Flora Lipps, MD 200 mL/hr at 03/13/19 1839 2 g at 03/13/19 1839  . Chlorhexidine Gluconate Cloth 2 % PADS 6 each  6 each Topical Q0600 Lance Coon, MD   6 each at 03/14/19 0645  . heparin injection 1,000-6,000 Units  1,000-6,000 Units CRRT PRN Murlean Iba, MD   2,600 Units at 03/13/19 0845  . hydrocortisone sodium succinate (SOLU-CORTEF) 100 MG injection 50 mg  50 mg Intravenous Q6H Flora Lipps, MD   50 mg at 03/14/19 1044  . insulin aspart (novoLOG) injection 0-15 Units  0-15 Units Subcutaneous Q4H Kasa, Kurian, MD      . ipratropium-albuterol (DUONEB) 0.5-2.5 (3) MG/3ML nebulizer solution 3 mL  3 mL Nebulization Q6H Tukov-Yual, Magdalene S, NP   3 mL at 03/14/19 1457  . LORazepam (ATIVAN) injection 1 mg  1 mg Intravenous Q4H PRN Tukov-Yual, Magdalene S, NP      . midodrine (PROAMATINE) tablet 5 mg  5 mg Oral TID WC Flora Lipps, MD   5 mg at 03/14/19 1211  . morphine 2 MG/ML injection 2 mg  2 mg Intravenous  Q2H PRN Darel Hong D, NP   2 mg at 03/13/19 2313  . norepinephrine (LEVOPHED) 16 mg in 244mL premix infusion  0-75 mcg/min Intravenous Titrated Flora Lipps, MD 0.94 mL/hr at 03/14/19 1100 1 mcg/min at 03/14/19 1100  . pantoprazole (PROTONIX) injection 40 mg  40 mg Intravenous Q12H Ottie Glazier, MD   40 mg at 03/14/19 1044  . pureflow IV solution for Dialysis   CRRT Continuous Murlean Iba, MD 2,500 mL/hr at 03/13/19 1900    . senna (SENOKOT) tablet 8.6 mg  1 tablet Oral Daily PRN Flora Lipps, MD      . sodium chloride flush (NS) 0.9 % injection 10-40 mL  10-40 mL Intracatheter Q12H Tukov-Yual, Magdalene S, NP   30 mL at 03/14/19 1051  . sodium chloride flush (NS) 0.9 % injection 10-40 mL  10-40 mL  Intracatheter PRN Tukov-Yual, Arlyss Gandy, NP      . traZODone (DESYREL) tablet 100 mg  100 mg Oral QHS PRN Tukov-Yual, Magdalene S, NP   100 mg at 03/14/19 0007    Musculoskeletal: Strength & Muscle Tone: decreased Gait & Station: Not assessed Patient leans: N/A  Psychiatric Specialty Exam: Physical Exam  Nursing note and vitals reviewed. Constitutional: He appears well-developed and well-nourished.  HENT:  Head: Normocephalic and atraumatic.  Eyes: EOM are normal.  Neck: Normal range of motion.  Cardiovascular: Regular rhythm.  Respiratory: Effort normal. No respiratory distress.  With respiratory pressure support  Musculoskeletal: Normal range of motion.  Neurological: He is alert.  Psychiatric: His speech is normal. His mood appears anxious. Cognition and memory are impaired. He expresses impulsivity. He exhibits a depressed mood. He expresses suicidal ideation. He expresses suicidal plans. He is inattentive.    Review of Systems  Respiratory: Positive for shortness of breath.   Neurological: Positive for weakness.  Psychiatric/Behavioral: Positive for depression and suicidal ideas. Negative for hallucinations and substance abuse. The patient is nervous/anxious. The patient  does not have insomnia.   All other systems reviewed and are negative.   Blood pressure (!) 114/54, pulse 100, temperature 99.1 F (37.3 C), temperature source Axillary, resp. rate (!) 22, height 5\' 7"  (1.702 m), weight 106.7 kg, SpO2 95 %.Body mass index is 36.84 kg/m.  General Appearance: Casual  Eye Contact:  Fair  Speech:  Clear and Coherent  Volume:  Normal  Mood:  Anxious  Affect:  Congruent  Thought Process:  Altered, confused   Orientation:  Person  Thought Content:  Hallucinations: None  Suicidal Thoughts:  Denies today, however admission for intentional drug overdose as suicide attempt.  Homicidal Thoughts:  No  Memory:  Poor  Judgement:  Poor  Insight:  Present and Shallow  Psychomotor Activity:  Decreased  Concentration:  Concentration: Fair  Recall:  [employer] of Knowledge:  Fair  Language:  Good  Akathisia:  No  Handed:  Right  AIMS (if indicated):     Assets:  Social Support patient has given consent to talk to mother, [name] [phone]  ADL's:  Impaired  Cognition:  Impaired,  Mild  Sleep:   Adequate    Treatment Plan Summary: Daily contact with patient to assess and evaluate symptoms and progress in treatment and Medication management Major depressive disorder, recurrent,severe without psychosis: Hold psychiatric medication at this time until patient more medically stable.  -inpatient psychiatric admission when medically cleared  Disposition: Recommend psychiatric Inpatient admission when medically cleared. Supportive therapy provided about ongoing stressors.  patient has given consent to talk to mother, [name] [phone]  Psychiatry will continue to follow and advise medication management as indicated.  Waylan Boga, NP 03/14/2019 3:25 PM

## 2019-03-14 NOTE — Progress Notes (Signed)
[facility], [place] 03/14/19  Subjective:   Patient remains critically ill Continued on CRRT Decreased requirement for pressors- now on small dose EPI  06/05 0701 - 06/06 0700 In: 5170 [I.V.:1535.1; IV Piggyback:138.8] Out: 1356 [Urine:60]  more alert- able to answer questions/ Taking in clears   Objective:  Vital signs in last 24 hours:  Temp:  [97.4 F (36.3 C)-99.1 F (37.3 C)] 99.1 F (37.3 C) (06/06 0800) Pulse Rate:  [89-108] 98 (06/06 0900) Resp:  [10-37] 29 (06/06 0900) BP: (96-130)/(47-80) 104/47 (06/06 0900) SpO2:  [91 %-100 %] 97 % (06/06 0900) Arterial Line BP: (98-143)/(52-73) 111/52 (06/06 0900) FiO2 (%):  [35 %-70 %] 35 % (06/06 0800) Weight:  [106.7 kg] 106.7 kg (06/06 0500)  Weight change: -2.5 kg Filed Weights   03/12/19 0500 03/13/19 0449 03/14/19 0500  Weight: 108.9 kg 109.2 kg 106.7 kg    Intake/Output:    Intake/Output Summary (Last 24 hours) at 03/14/2019 1057 Last data filed at 03/14/2019 1051 Gross per 24 hour  Intake 1703.97 ml  Output 1356 ml  Net 347.97 ml   General :        critically ill Head:              Normocephalic, without obvious abnormality, atraumatic Eyes/ENT:      Dry oral mucus membranes Neck:              Supple,    Lungs:            Coarse,  HFNC O2 Heart:             Tachycardic, no rub or gallop Abdomen:      Soft, non-tender,   Extremities:   no cyanosis, + dependent edema Skin:               Skin color, texture, turgor normal, no rashes or lesions Neurologic:    alert, able to follow commands IJ vascath  Basic Metabolic Panel:  Recent Labs  Lab 03/10/19 0431  03/11/19 0453  03/12/19 0146  03/13/19 0441 03/13/19 1155 03/13/19 1732 03/13/19 1951 03/13/19 2336 03/14/19 0430 03/14/19 0803  NA  --    < > 139   < > 136   < > 136 137 135 137 139 138 138  K  --    < > 5.6*   < > 3.7   < > 3.8 3.7 3.4* 3.3* 3.3* 3.2* 3.5  CL  --    < > 104   < > 104   < > 104 104 102 105 105 104  104  CO2  --    < > 23   < > 24   < > 21* 21* 20* 21* 21* 24 21*  GLUCOSE  --    < > 137*   < > 80   < > 159* 134* 126* 124* 110* 100* 118*  BUN  --    < > 31*   < > 32*   < > 43* 46* 45* 44* 43* 41* 39*  CREATININE  --    < > 4.53*   < > 3.86*   < > 3.63* 3.83* 3.56* 3.29* 3.32* 3.05* 3.01*  CALCIUM  --    < > 8.0*   < > 8.5*   < > 8.0* 8.0* 8.0* 8.0* 8.4* 8.2* 8.5*  MG 2.0  --  1.9  --  2.0  --  2.3  --   --   --   --  2.2  --   PHOS 4.7*   < >  --    < >  --    < > 4.2 3.6 3.4 3.4 3.0  --  2.7   < > = values in this interval not displayed.     CBC: Recent Labs  Lab 03/08/19 2333  03/10/19 0431 03/10/19 2257 03/11/19 0453 03/12/19 0146 03/13/19 0441 03/14/19 0430  WBC 14.8*   < > 30.4* 28.5* 24.7* 23.8* 17.1* 11.1*  NEUTROABS 7.6  --  23.9*  --   --   --   --   --   HGB 14.3   < > 11.4* 11.4* 10.8* 9.6* 8.1* 7.0*  HCT 41.8   < > 33.1* 34.0* 32.8* 28.1* 23.3* 20.2*  MCV 78.9*   < > 79.0* 81.0 82.2 79.4* 78.5* 78.3*  PLT 406*   < > 240 168 124* 156 140* 154   < > = values in this interval not displayed.     No results found for: HEPBSAG, HEPBSAB, HEPBIGM    Microbiology:  Recent Results (from the past 240 hour(s))  SARS Coronavirus 2 (CEPHEID - Performed in [place] hospital lab), Hosp Order     Status: None   Collection Time: 03/08/19 11:48 PM  Result Value Ref Range Status   SARS Coronavirus 2 NEGATIVE NEGATIVE Final    Comment: (NOTE) If result is NEGATIVE SARS-CoV-2 target nucleic acids are NOT DETECTED. The SARS-CoV-2 RNA is generally detectable in upper and lower  respiratory specimens during the acute phase of infection. The lowest  concentration of SARS-CoV-2 viral copies this assay can detect is 250  copies / mL. A negative result does not preclude SARS-CoV-2 infection  and should not be used as the sole basis for treatment or other  patient management decisions.  A negative result may occur with  improper specimen collection / handling, submission of  specimen other  than nasopharyngeal swab, presence of viral mutation(s) within the  areas targeted by this assay, and inadequate number of viral copies  (<250 copies / mL). A negative result must be combined with clinical  observations, patient history, and epidemiological information. If result is POSITIVE SARS-CoV-2 target nucleic acids are DETECTED. The SARS-CoV-2 RNA is generally detectable in upper and lower  respiratory specimens dur ing the acute phase of infection.  Positive  results are indicative of active infection with SARS-CoV-2.  Clinical  correlation with patient history and other diagnostic information is  necessary to determine patient infection status.  Positive results do  not rule out bacterial infection or co-infection with other viruses. If result is PRESUMPTIVE POSTIVE SARS-CoV-2 nucleic acids MAY BE PRESENT.   A presumptive positive result was obtained on the submitted specimen  and confirmed on repeat testing.  While 2019 novel coronavirus  (SARS-CoV-2) nucleic acids may be present in the submitted sample  additional confirmatory testing may be necessary for epidemiological  and / or clinical management purposes  to differentiate between  SARS-CoV-2 and other Sarbecovirus currently known to infect humans.  If clinically indicated additional testing with an alternate test  methodology [phone]) is advised. The SARS-CoV-2 RNA is generally  detectable in upper and lower respiratory sp ecimens during the acute  phase of infection. The expected result is Negative. Fact Sheet for Patients:  StrictlyIdeas.no Fact Sheet for Healthcare Providers: BankingDealers.co.za This test is not yet approved or cleared by the [place] FDA and has been authorized for detection and/or diagnosis of SARS-CoV-2 by FDA under an  Emergency Use Authorization (EUA).  This EUA will remain in effect (meaning this test can be used) for the  duration of the COVID-19 declaration under Section 564(b)(1) of the Act, 21 U.S.C. section 360bbb-3(b)(1), unless the authorization is terminated or revoked sooner. Performed at [facility], [address] [place], [place] [address]   MRSA PCR Screening     Status: None   Collection Time: 03/09/19  2:48 AM  Result Value Ref Range Status   MRSA by PCR NEGATIVE NEGATIVE Final    Comment:        The GeneXpert MRSA Assay (FDA approved for NASAL specimens only), is one component of a comprehensive MRSA colonization surveillance program. It is not intended to diagnose MRSA infection nor to guide or monitor treatment for MRSA infections. Performed at [facility], [address] [place], [place] [address]   Culture, respiratory (non-expectorated)     Status: None   Collection Time: 03/09/19  8:00 AM  Result Value Ref Range Status   Specimen Description   Final    TRACHEAL ASPIRATE Performed at [facility], [address] [place], [place] [address]    Special Requests   Final    NONE Performed at [facility], [address] [place], [place] [address]    Gram Stain   Final    ABUNDANT WBC PRESENT,BOTH PMN AND MONONUCLEAR MODERATE GRAM POSITIVE COCCI RARE YEAST    Culture   Final    MODERATE GROUP B STREP(S.AGALACTIAE)ISOLATED TESTING AGAINST S. AGALACTIAE NOT ROUTINELY PERFORMED DUE TO PREDICTABILITY OF AMP/PEN/VAN SUSCEPTIBILITY. Performed at [facility], [place] [address], [place], [place] [address]    Report Status 03/11/2019 FINAL  Final  CULTURE, BLOOD (ROUTINE X 2) w Reflex to ID Panel     Status: None (Preliminary result)   Collection Time: 03/09/19  2:53 PM  Result Value Ref Range Status   Specimen Description   Final    BLOOD BLOOD RIGHT ARM Performed at [facility], [address] [place], [place] [address]    Special Requests   Final    BOTTLES DRAWN AEROBIC AND ANAEROBIC Blood Culture results may  not be optimal due to an excessive volume of blood received in culture bottles Performed at [facility], [address], [place], [place] [address]    Culture  Setup Time PENDING  Incomplete   Culture   Final    CULTURE REINCUBATED FOR BETTER GROWTH Performed at [place] [address], [place], [place] [address]    Report Status PENDING  Incomplete  CULTURE, BLOOD (ROUTINE X 2) w Reflex to ID Panel     Status: None (Preliminary result)   Collection Time: 03/09/19  5:00 PM  Result Value Ref Range Status   Specimen Description   Final    BLOOD RIGHT ARM Performed at [facility], [address], [place], [place] [address]    Special Requests   Final    BOTTLES DRAWN AEROBIC AND ANAEROBIC Blood Culture results may not be optimal due to an excessive volume of blood received in culture bottles Performed at [facility], [address], [place], [place] [address]    Culture  Setup Time PENDING  Incomplete   Culture   Final    CULTURE REINCUBATED FOR BETTER GROWTH Performed at [place] [address] [place], [place] [address]    Report Status PENDING  Incomplete  Urine Culture  Status: None   Collection Time: 03/11/19  4:33 AM  Result Value Ref Range Status   Specimen Description   Final    URINE, RANDOM Performed at [facility], [address] [place], [place] [address]    Special Requests   Final    NONE Performed at [facility], [address] [place], [place] [address]    Culture   Final    NO GROWTH Performed at [facility], [place] [address] [place], [place] [address]    Report Status 03/12/2019 FINAL  Final    Coagulation Studies: No results for input(s): LABPROT, INR in the last 72 hours.  Urinalysis: No results for input(s): COLORURINE, LABSPEC, PHURINE, GLUCOSEU, HGBUR, BILIRUBINUR, KETONESUR, PROTEINUR, UROBILINOGEN, NITRITE, LEUKOCYTESUR in the last 72 hours.  Invalid  input(s): APPERANCEUR    Imaging: Ct Head Wo Contrast  Result Date: 03/13/2019 CLINICAL DATA:  Drug overdose EXAM: CT HEAD WITHOUT CONTRAST TECHNIQUE: Contiguous axial images were obtained from the base of the skull through the vertex without intravenous contrast. COMPARISON:  Head CT 12/29/2006 FINDINGS: Brain: There is no mass, hemorrhage or extra-axial collection. The size and configuration of the ventricles and extra-axial CSF spaces are normal. The brain parenchyma is normal, without acute or chronic infarction. Vascular: No abnormal hyperdensity of the major intracranial arteries or dural venous sinuses. No intracranial atherosclerosis. Skull: The visualized skull base, calvarium and extracranial soft tissues are normal. Sinuses/Orbits: No fluid levels or advanced mucosal thickening of the visualized paranasal sinuses. No mastoid or middle ear effusion. The orbits are normal. IMPRESSION: Normal head CT. Electronically Signed   By: Ulyses Jarred M.D.   On: 03/13/2019 02:30   Dg Chest Port 1 View  Result Date: 03/13/2019 CLINICAL DATA:  Acute respiratory failure EXAM: PORTABLE CHEST 1 VIEW COMPARISON:  03/11/2019 FINDINGS: The left-sided catheter tip projects over the SVC. The heart size is stable from prior study. There is worsening vascular congestion in worsening bilateral airspace opacities. There are likely bilateral pleural effusions. No evidence of a pneumothorax. The patient has been extubated. Enteric tube has been removed. IMPRESSION: 1. Status post extubation. 2. Remaining lines and tubes as above. 3. Worsening vascular congestion. Persistent small bilateral pleural effusions. Electronically Signed   By: Constance Holster M.D.   On: 03/13/2019 03:46     Medications:   . albumin human 12.5 g (03/14/19 1046)  . cefTRIAXone (ROCEPHIN)  IV 2 g (03/13/19 1839)  . heparin 1,500 Units/hr (03/14/19 0703)  . norepinephrine (LEVOPHED) Adult infusion 2 mcg/min (03/14/19 0700)  . pureflow 2,500  mL/hr at 03/13/19 1900   . Chlorhexidine Gluconate Cloth  6 each Topical Q0600  . hydrocortisone sod succinate (SOLU-CORTEF) inj  50 mg Intravenous Q6H  . insulin aspart  0-15 Units Subcutaneous Q4H  . ipratropium-albuterol  3 mL Nebulization Q6H  . midodrine  5 mg Oral TID WC  . pantoprazole (PROTONIX) IV  40 mg Intravenous Q12H  . sodium chloride flush  10-40 mL Intracatheter Q12H   bisacodyl, heparin, LORazepam, morphine injection, sennosides, sodium chloride flush, traZODone  Assessment/ Plan:  28 y.o.caucasian male with HTN , was admitted on 03/08/2019 with ARF, acute resp failure secondary to drug overdose  1.  Oliguric acute renal failure with hyperkalemia and volume overload Minimal urine output, likely severe ATN Patient remains critically ill, severely hypotensive on multiple pressors CRRT started 6/1 Recommend to continue CRRT for metabolic and volume optimization  UF 150 cc/hr at present  continue low dose albumin  support for oncotic support Will try one dose of iv lasix Getting close to discontinuing CRRT - if Dialyzer expires or system clots - don't restart  2.  Acute respiratory failure Extubated 6/4 am Now requiring HFNC O2  3. Metabolic acidosis -Improved with hemodialysis support  4. Drug overdose multiple tablets of Phenergan, losartan, amlodipine, clonidine IVC Sitter at bedside     LOS: [place] 6/6/202010:57 AM  [place], [place]  Note: This note was prepared with Dragon dictation. Any transcription errors are unintentional

## 2019-03-14 NOTE — Progress Notes (Signed)
[place] for heparin drip management  Indication: chest pain/ACS  No Known Allergies  Patient Measurements: Height: 5' 7.01" (170.2 cm) Weight: 240 lb 11.9 oz (109.2 kg) IBW/kg (Calculated) : 66.12 Heparin Dosing Weight: 92kg    Vital Signs: Temp: 98.1 F (36.7 C) (06/06 0400) Temp Source: Oral (06/06 0400) BP: 114/57 (06/06 0500) Pulse Rate: 101 (06/06 0500)  Labs: Recent Labs    03/12/19 0146  03/12/19 1833  03/12/19 2302  03/13/19 0441  03/13/19 1732 03/13/19 1951 03/13/19 2336 03/14/19 0430  HGB 9.6*  --   --   --   --   --  8.1*  --   --   --   --  7.0*  HCT 28.1*  --   --   --   --   --  23.3*  --   --   --   --  20.2*  PLT 156  --   --   --   --   --  140*  --   --   --   --  154  HEPARINUNFRC  --   --   --   --   --   --   --   --  0.28*  --   --  0.29*  CREATININE 3.86*   < >  --    < >  --    < > 3.63*   < > 3.56* 3.29* 3.32*  --   TROPONINI  --   --  0.24*  --  0.29*  --  0.27*  --   --   --   --   --    < > = values in this interval not displayed.    Estimated Creatinine Clearance: 38.3 mL/min (A) (by C-G formula based on SCr of 3.32 mg/dL (H)).   Medical History: Past Medical History:  Diagnosis Date  . Hypertension   . IBS (irritable bowel syndrome)     Medications:  Scheduled:  . Chlorhexidine Gluconate Cloth  6 each Topical Q0600  . heparin  1,300 Units Intravenous Once  . hydrocortisone sod succinate (SOLU-CORTEF) inj  50 mg Intravenous Q6H  . insulin aspart  0-15 Units Subcutaneous Q4H  . ipratropium-albuterol  3 mL Nebulization Q6H  . midodrine  5 mg Oral TID WC  . pantoprazole (PROTONIX) IV  40 mg Intravenous Q12H  . sodium chloride flush  10-40 mL Intracatheter Q12H   Infusions:  . albumin human Stopped (03/13/19 2232)  . cefTRIAXone (ROCEPHIN)  IV 2 g (03/13/19 1839)  . heparin 1,300 Units/hr (03/14/19 0500)  . norepinephrine (LEVOPHED) Adult infusion 4 mcg/min (03/14/19 0500)  . pureflow  2,500 mL/hr at 03/13/19 1900    Assessment: Pharmacy consulted for heparin drip management for 14 28 year old male with a medical history as indicated below who presented to the ED after taking 38 tablets of Phenergan, 19 tablets of losartan, 42 tablets of amlodipine, and 78 tablets of clonazepam.   Patient with chest pain overnight and elevated tropinins. Patient previously ordered heparin 5000 units SQ Q8hr.   Goal of Therapy:  Heparin level 0.3-0.7 units/ml Monitor platelets by anticoagulation protocol: Yes   Plan:  Heparin level subtherapeutic. Verified with RN no interruptions in therapy. Heparin bolus 1400 units x 1 and increase rate to 1300 units/hr. Will obtain heparin level at 0330.   6/6:  HL @ 0430 = 0.29 Will order heparin 1300 units IV X 1 bolus and increase drip  rate to 1500 units/hr. Will recheck HL 6 hrs after rate change.   Pharmacy will continue to monitor and adjust per consult.   Ewald Beg D 03/14/2019,5:15 AM

## 2019-03-15 LAB — CBC
HCT: 21.2 % — ABNORMAL LOW (ref 39.0–52.0)
Hemoglobin: 7.1 g/dL — ABNORMAL LOW (ref 13.0–17.0)
MCH: 26.8 pg (ref 26.0–34.0)
MCHC: 33.5 g/dL (ref 30.0–36.0)
MCV: 80 fL (ref 80.0–100.0)
Platelets: 200 10*3/uL (ref 150–400)
RBC: 2.65 MIL/uL — ABNORMAL LOW (ref 4.22–5.81)
RDW: 13.2 % (ref 11.5–15.5)
WBC: 13.7 10*3/uL — ABNORMAL HIGH (ref 4.0–10.5)
nRBC: 0.4 % — ABNORMAL HIGH (ref 0.0–0.2)

## 2019-03-15 LAB — BASIC METABOLIC PANEL
Anion gap: 11 (ref 5–15)
BUN: 48 mg/dL — ABNORMAL HIGH (ref 6–20)
CO2: 23 mmol/L (ref 22–32)
Calcium: 8.3 mg/dL — ABNORMAL LOW (ref 8.9–10.3)
Chloride: 104 mmol/L (ref 98–111)
Creatinine, Ser: 4.07 mg/dL — ABNORMAL HIGH (ref 0.61–1.24)
GFR calc Af Amer: 21 mL/min — ABNORMAL LOW (ref >60)
GFR calc non Af Amer: 18 mL/min — ABNORMAL LOW (ref >60)
Glucose, Bld: 109 mg/dL — ABNORMAL HIGH (ref 70–99)
Potassium: 3.2 mmol/L — ABNORMAL LOW (ref 3.5–5.1)
Sodium: 138 mmol/L (ref 135–145)

## 2019-03-15 LAB — CULTURE, BLOOD (ROUTINE X 2): Culture: NO GROWTH

## 2019-03-15 LAB — GLUCOSE, CAPILLARY
Glucose-Capillary: 79 mg/dL (ref 70–99)
Glucose-Capillary: 111 mg/dL — ABNORMAL HIGH (ref 70–99)

## 2019-03-15 LAB — MAGNESIUM: Magnesium: 2.3 mg/dL (ref 1.7–2.4)

## 2019-03-15 MED ORDER — POTASSIUM CHLORIDE 10 MEQ/100ML IV SOLN
10.0000 meq | INTRAVENOUS | Status: AC
  Administered 2019-03-15 (×2): 10 meq via INTRAVENOUS
  Filled 2019-03-15 (×2): qty 100

## 2019-03-15 MED ORDER — IPRATROPIUM-ALBUTEROL 0.5-2.5 (3) MG/3ML IN SOLN
3.0000 mL | Freq: Four times a day (QID) | RESPIRATORY_TRACT | Status: DC | PRN
  Administered 2019-03-17: 3 mL via RESPIRATORY_TRACT
  Filled 2019-03-15: qty 3

## 2019-03-15 NOTE — Progress Notes (Signed)
[facility], [place] 03/15/19  Subjective:    06/06 0701 - 06/07 0700 In: 603.2 [P.O.:240; I.V.:163.2; IV Piggyback:200] Out: 1965 [Urine:585]  more alert- able to answer questions/ Off on pressors Was taken off of CRRT yesterday Urine output appears to be improving Taking in clears and soft solids   Objective:  Vital signs in last 24 hours:  Temp:  [98.4 F (36.9 C)-99.5 F (37.5 C)] 98.4 F (36.9 C) (06/07 0800) Pulse Rate:  [98-107] 99 (06/07 0858) Resp:  [15-34] 34 (06/07 0858) BP: (102-132)/(48-59) 109/53 (06/07 0800) SpO2:  [92 %-100 %] 99 % (06/07 0858) Weight:  [103.8 kg] 103.8 kg (06/07 0438)  Weight change: -2.9 kg Filed Weights   03/13/19 0449 03/14/19 0500 03/15/19 0438  Weight: 109.2 kg 106.7 kg 103.8 kg    Intake/Output:    Intake/Output Summary (Last 24 hours) at 03/15/2019 1028 Last data filed at 03/15/2019 1004 Gross per 24 hour  Intake 843.16 ml  Output 1569 ml  Net -725.84 ml   General :         No acute distress Head:              Normocephalic, without obvious abnormality, atraumatic Eyes/ENT:      Dry oral mucus membranes Neck:              Supple,    Lungs:            Coarse, no crackles, [place] O2 Heart:             Tachycardic, no rub or gallop Abdomen:      Soft, non-tender,   Extremities:   no cyanosis, + dependent edema Skin:               Skin color, texture, turgor normal, no rashes or lesions Neurologic:    alert, able to follow commands IJ vascath  Basic Metabolic Panel:  Recent Labs  Lab 03/11/19 0453  03/12/19 0146  03/13/19 0441  03/13/19 1951 03/13/19 2336 03/14/19 0430 03/14/19 0803 03/14/19 1225 03/14/19 1602 03/15/19 0434  NA 139   < > 136   < > 136   < > 137 139 138 138 137 137 138  K 5.6*   < > 3.7   < > 3.8   < > 3.3* 3.3* 3.2* 3.5 3.1* 3.1* 3.2*  CL 104   < > 104   < > 104   < > 105 105 104 104 102 103 104  CO2 23   < > 24   < > 21*   < > 21* 21* 24 21* 23 24 23   GLUCOSE 137*   < >  80   < > 159*   < > 124* 110* 100* 118* 118* 108* 109*  BUN 31*   < > 32*   < > 43*   < > 44* 43* 41* 39* 38* 37* 48*  CREATININE 4.53*   < > 3.86*   < > 3.63*   < > 3.29* 3.32* 3.05* 3.01* 3.03* 3.02* 4.07*  CALCIUM 8.0*   < > 8.5*   < > 8.0*   < > 8.0* 8.4* 8.2* 8.5* 8.5* 8.6* 8.3*  MG 1.9  --  2.0  --  2.3  --   --   --  2.2  --   --   --  2.3  PHOS  --    < >  --    < > 4.2   < >  3.4 3.0  --  2.7 2.4* 2.3*  --    < > = values in this interval not displayed.     CBC: Recent Labs  Lab 03/08/19 2333  03/10/19 0431  03/11/19 0453 03/12/19 0146 03/13/19 0441 03/14/19 0430 03/15/19 0434  WBC 14.8*   < > 30.4*   < > 24.7* 23.8* 17.1* 11.1* 13.7*  NEUTROABS 7.6  --  23.9*  --   --   --   --   --   --   HGB 14.3   < > 11.4*   < > 10.8* 9.6* 8.1* 7.0* 7.1*  HCT 41.8   < > 33.1*   < > 32.8* 28.1* 23.3* 20.2* 21.2*  MCV 78.9*   < > 79.0*   < > 82.2 79.4* 78.5* 78.3* 80.0  PLT 406*   < > 240   < > 124* 156 140* 154 200   < > = values in this interval not displayed.     No results found for: HEPBSAG, HEPBSAB, HEPBIGM    Microbiology:  Recent Results (from the past 240 hour(s))  SARS Coronavirus 2 (CEPHEID - Performed in [place] hospital lab), Hosp Order     Status: None   Collection Time: 03/08/19 11:48 PM  Result Value Ref Range Status   SARS Coronavirus 2 NEGATIVE NEGATIVE Final    Comment: (NOTE) If result is NEGATIVE SARS-CoV-2 target nucleic acids are NOT DETECTED. The SARS-CoV-2 RNA is generally detectable in upper and lower  respiratory specimens during the acute phase of infection. The lowest  concentration of SARS-CoV-2 viral copies this assay can detect is 250  copies / mL. A negative result does not preclude SARS-CoV-2 infection  and should not be used as the sole basis for treatment or other  patient management decisions.  A negative result may occur with  improper specimen collection / handling, submission of specimen other  than nasopharyngeal swab, presence  of viral mutation(s) within the  areas targeted by this assay, and inadequate number of viral copies  (<250 copies / mL). A negative result must be combined with clinical  observations, patient history, and epidemiological information. If result is POSITIVE SARS-CoV-2 target nucleic acids are DETECTED. The SARS-CoV-2 RNA is generally detectable in upper and lower  respiratory specimens dur ing the acute phase of infection.  Positive  results are indicative of active infection with SARS-CoV-2.  Clinical  correlation with patient history and other diagnostic information is  necessary to determine patient infection status.  Positive results do  not rule out bacterial infection or co-infection with other viruses. If result is PRESUMPTIVE POSTIVE SARS-CoV-2 nucleic acids MAY BE PRESENT.   A presumptive positive result was obtained on the submitted specimen  and confirmed on repeat testing.  While 2019 novel coronavirus  (SARS-CoV-2) nucleic acids may be present in the submitted sample  additional confirmatory testing may be necessary for epidemiological  and / or clinical management purposes  to differentiate between  SARS-CoV-2 and other Sarbecovirus currently known to infect humans.  If clinically indicated additional testing with an alternate test  methodology [phone]) is advised. The SARS-CoV-2 RNA is generally  detectable in upper and lower respiratory sp ecimens during the acute  phase of infection. The expected result is Negative. Fact Sheet for Patients:  StrictlyIdeas.no Fact Sheet for Healthcare Providers: BankingDealers.co.za This test is not yet approved or cleared by the [place] FDA and has been authorized for detection and/or diagnosis of SARS-CoV-2 by FDA under  an Emergency Use Authorization (EUA).  This EUA will remain in effect (meaning this test can be used) for the duration of the COVID-19 declaration under Section  564(b)(1) of the Act, 21 U.S.C. section 360bbb-3(b)(1), unless the authorization is terminated or revoked sooner. Performed at [facility], [address] [place], [place] [address]   MRSA PCR Screening     Status: None   Collection Time: 03/09/19  2:48 AM  Result Value Ref Range Status   MRSA by PCR NEGATIVE NEGATIVE Final    Comment:        The GeneXpert MRSA Assay (FDA approved for NASAL specimens only), is one component of a comprehensive MRSA colonization surveillance program. It is not intended to diagnose MRSA infection nor to guide or monitor treatment for MRSA infections. Performed at [facility], [address] [place], [place] [address]   Culture, respiratory (non-expectorated)     Status: None   Collection Time: 03/09/19  8:00 AM  Result Value Ref Range Status   Specimen Description   Final    TRACHEAL ASPIRATE Performed at [facility], [address], [place], [place] [address]    Special Requests   Final    NONE Performed at [facility], [address] [place], [place] [address]    Gram Stain   Final    ABUNDANT WBC PRESENT,BOTH PMN AND MONONUCLEAR MODERATE GRAM POSITIVE COCCI RARE YEAST    Culture   Final    MODERATE GROUP B STREP(S.AGALACTIAE)ISOLATED TESTING AGAINST S. AGALACTIAE NOT ROUTINELY PERFORMED DUE TO PREDICTABILITY OF AMP/PEN/VAN SUSCEPTIBILITY. Performed at [facility], [place] [address] [place], [place] [address]    Report Status 03/11/2019 FINAL  Final  CULTURE, BLOOD (ROUTINE X 2) w Reflex to ID Panel     Status: None (Preliminary result)   Collection Time: 03/09/19  2:53 PM  Result Value Ref Range Status   Specimen Description   Final    BLOOD BLOOD RIGHT ARM Performed at [facility], [address] [place], [place] [address]    Special Requests   Final    BOTTLES DRAWN AEROBIC AND ANAEROBIC Blood Culture results may not be optimal due to an excessive volume of blood  received in culture bottles Performed at [facility], [address], [place], [place] [address]    Culture  Setup Time PENDING  Incomplete   Culture   Final    NO GROWTH 5 DAYS Performed at [facility], [place] [address], [place], [place] [address]    Report Status PENDING  Incomplete  CULTURE, BLOOD (ROUTINE X 2) w Reflex to ID Panel     Status: None   Collection Time: 03/09/19  5:00 PM  Result Value Ref Range Status   Specimen Description   Final    BLOOD RIGHT ARM Performed at [facility], [address], [place], [place] [address]    Special Requests   Final    BOTTLES DRAWN AEROBIC AND ANAEROBIC Blood Culture results may not be optimal due to an excessive volume of blood received in culture bottles Performed at [facility], [address], [place],  [address]    Culture   Final    NO GROWTH 5 DAYS Performed at [facility], [place] [address] [place],  [address]    Report Status 03/15/2019 FINAL  Final  Urine Culture     Status: None   Collection Time: 03/11/19  4:33 AM  Result Value Ref Range Status   Specimen Description   Final    URINE, RANDOM Performed at [facility], [address], [place], [place] [address]    Special Requests   Final    NONE Performed at [facility], [address] [place], [place] [address]    Culture   Final    NO GROWTH Performed at [facility], [place] [address] [place], [place] [address]    Report Status 03/12/2019 FINAL  Final    Coagulation Studies: No results for input(s): LABPROT, INR in the last 72 hours.  Urinalysis: No results for input(s): COLORURINE, LABSPEC, PHURINE, GLUCOSEU, HGBUR, BILIRUBINUR, KETONESUR, PROTEINUR, UROBILINOGEN, NITRITE, LEUKOCYTESUR in the last 72 hours.  Invalid input(s): APPERANCEUR    Imaging: Dg Abd 1 View  Result Date: 03/14/2019 CLINICAL DATA:  28 year old male with history of abdominal pain. EXAM: ABDOMEN - 1  VIEW COMPARISON:  03/09/2019. FINDINGS: Right femoral central venous catheter with tip projecting over the expected location of the proximal right common femoral vein. Gas and stool are seen scattered throughout the colon extending to the level of the distal rectum. No pathologic distension of small bowel is noted. Several nondilated gas-filled loops of small bowel or noted projecting over the central abdomen. No gross evidence of pneumoperitoneum. IMPRESSION: 1. Nonspecific, nonobstructive bowel gas pattern, as above. 2. No pneumoperitoneum. Electronically Signed   By: Vinnie Langton M.D.   On: 03/14/2019 22:34     Medications:   . albumin human 12.5 g (03/15/19 0900)  . cefTRIAXone (ROCEPHIN)  IV 2 g (03/14/19 1758)  . potassium chloride     . Chlorhexidine Gluconate Cloth  6 each Topical Q0600   bisacodyl, heparin, ipratropium-albuterol, LORazepam, morphine injection, senna, sodium chloride flush, traZODone  Assessment/ Plan:  28 y.o.caucasian male with HTN , was admitted on 03/08/2019 with ARF, acute resp failure secondary to drug overdose  1.  Oliguric acute renal failure with hyperkalemia (resolved) and volume overload  likely severe ATN CRRT started 6/1-> 6/6 Urine output appears to be improving.  Close to 600 cc last 24 hours Clinically patient has improved significantly Electrolytes and volume status are acceptable.  No acute indication for dialysis at present but will assess daily  2.  Acute respiratory failure Extubated 6/4 am Now requiring [place] O2  3. Metabolic acidosis -Improved with hemodialysis support  4. Drug overdose multiple tablets of Phenergan, losartan, amlodipine, clonidine IVC Sitter at bedside  5.  Hypokalemia Replace potassium as needed Expected to improve with normal diet     LOS: [place] 6/7/202010:[address] [place], [place]  Note: This note was prepared with Dragon dictation. Any  transcription errors are unintentional

## 2019-03-15 NOTE — Consult Note (Signed)
Pharmacy Electrolyte Monitoring Consult:  Pharmacy consulted to assist in monitoring and replacing electrolytes in this      Labs:   Potassium (mmol/L)  Date Value  03/15/2019 3.2 (L)   Magnesium (mg/dL)  Date Value  03/15/2019 2.3   Phosphorus (mg/dL)  Date Value  03/14/2019 2.3 (L)   Calcium (mg/dL)  Date Value  03/15/2019 8.3 (L)   Albumin (g/dL)  Date Value  03/14/2019 3.4 (L)   Corrected Ca: 8.4 mg/dL  Assessment/Plan: 28 year old male with a medical history as indicated below who presented to the ED after taking 38 tablets of Phenergan, 19 tablets of losartan, 42 tablets of amlodipine, and 78 tablets of clonazepam and noted electrolyte abnormalities.    6/7 K 3.2 Mag 2.3  Scr 4.07   Nephrology following. CRRT stopped. Transfer to floor today  Will order KCl 12mEq IV x 2 doses.Conservative d/t renal fxn. Pharmacy will continue to follow along with scheduled renal function panels scheduled Q4hr.   Pharmacy will continue to monitor and adjust per consult.   Chinita Greenland PharmD Clinical Pharmacist 03/15/2019

## 2019-03-15 NOTE — Progress Notes (Signed)
Pt has remained alert and oriented to self, place, and time. Pt c/o achy abdominal pain 6/10. Pt repositioned and placed on bed pan-abd xray this am showed gas pattern- pt encouraged to lie on his side more often to facilitate motility. Pt transitioned to RA-SpO2 > 90%-NDN. Pt remained in NSR/ ST to low 100s on cardiac monitor. BP WNL. Safety sitter remains at bedside.

## 2019-03-15 NOTE — Progress Notes (Addendum)
CRITICAL CARE NOTE  CC  follow up respiratory failure  SUBJECTIVE Off of vasopressors Off CRRT Alert and awake Follows commands Seems very pleasant no agitation and no delirium Watching TV    BP (!) 108/56   Pulse (!) 101   Temp 99.5 F (37.5 C) (Oral)   Resp 17   Ht '5\' 7"'$  (1.702 m)   Wt 103.8 kg   SpO2 98%   BMI 35.84 kg/m    I/O last 3 completed shifts: In: 1703.3 [P.O.:240; I.V.:1213.3; IV Piggyback:250] Out: 3319 [Urine:645; Other:2674] No intake/output data recorded.  SpO2: 98 % O2 Flow Rate (L/min): 3 L/min FiO2 (%): 35 %   SIGNIFICANT EVENTS 5/31 admitted for severe shock and resp failure DRUG OD 6/1 started CRRT multiorgan failure, resp failure, vasopressors 6/2 severe hypoxia UF rates increased to 500 6/3 DNR, multiorgan failure 6/4 CODE STATUS changed to full code,high risk for reintubation severe hypoxia on CRRT 6/5 high flow [place], on CRRT, high risk for intubation 6/7 resp failure resolved, off CRRT, transfer to gen med floor   Review of Systems:  Gen:  Denies  fever, sweats, chills weigh loss  HEENT: Denies blurred vision, double vision, ear pain, eye pain, hearing loss, nose bleeds, sore throat Cardiac:  No dizziness, chest pain or heaviness, chest tightness,edema, No JVD Resp:   No cough, -sputum production, -shortness of breath,-wheezing, -hemoptysis,  Gi: Denies swallowing difficulty, stomach pain, nausea or vomiting, diarrhea, constipation, bowel incontinence Gu:  Denies bladder incontinence, burning urine Ext:   Denies Joint pain, stiffness or swelling Skin: Denies  skin rash, easy bruising or bleeding or hives Endoc:  Denies polyuria, polydipsia , polyphagia or weight change Psych:   Denies depression, insomnia or hallucinations  Other:  All other systems negative  Physical Examination:   GENERAL:NAD, no fevers, chills, no weakness no fatigue HEAD: Normocephalic, atraumatic.  EYES: PERLA, EOMI No scleral icterus.  MOUTH: Moist  mucosal membrane.  EAR, NOSE, THROAT: Clear without exudates. No external lesions.  NECK: Supple. No thyromegaly.  No JVD.  PULMONARY: CTA B/L no wheezing, rhonchi, crackles CARDIOVASCULAR: S1 and S2. Regular rate and rhythm. No murmurs GASTROINTESTINAL: Soft, nontender, nondistended. Positive bowel sounds.  MUSCULOSKELETAL: No swelling, clubbing, or edema.  NEUROLOGIC: No gross focal neurological deficits. 5/5 strength all extremities SKIN: No ulceration, lesions, rashes, or cyanosis.  PSYCHIATRIC: por insight, poor judgment ALL OTHER ROS ARE NEGATIVE       CULTURE RESULTS   Recent Results (from the past 240 hour(s))  SARS Coronavirus 2 (CEPHEID - Performed in [facility] hospital lab), Hosp Order     Status: None   Collection Time: 03/08/19 11:48 PM  Result Value Ref Range Status   SARS Coronavirus 2 NEGATIVE NEGATIVE Final    Comment: (NOTE) If result is NEGATIVE SARS-CoV-2 target nucleic acids are NOT DETECTED. The SARS-CoV-2 RNA is generally detectable in upper and lower  respiratory specimens during the acute phase of infection. The lowest  concentration of SARS-CoV-2 viral copies this assay can detect is 250  copies / mL. A negative result does not preclude SARS-CoV-2 infection  and should not be used as the sole basis for treatment or other  patient management decisions.  A negative result may occur with  improper specimen collection / handling, submission of specimen other  than nasopharyngeal swab, presence of viral mutation(s) within the  areas targeted by this assay, and inadequate number of viral copies  (<250 copies / mL). A negative result must be combined with clinical  observations, patient history, and epidemiological information. If result is POSITIVE SARS-CoV-2 target nucleic acids are DETECTED. The SARS-CoV-2 RNA is generally detectable in upper and lower  respiratory specimens dur ing the acute phase of infection.  Positive  results are indicative of  active infection with SARS-CoV-2.  Clinical  correlation with patient history and other diagnostic information is  necessary to determine patient infection status.  Positive results do  not rule out bacterial infection or co-infection with other viruses. If result is PRESUMPTIVE POSTIVE SARS-CoV-2 nucleic acids MAY BE PRESENT.   A presumptive positive result was obtained on the submitted specimen  and confirmed on repeat testing.  While 2019 novel coronavirus  (SARS-CoV-2) nucleic acids may be present in the submitted sample  additional confirmatory testing may be necessary for epidemiological  and / or clinical management purposes  to differentiate between  SARS-CoV-2 and other Sarbecovirus currently known to infect humans.  If clinically indicated additional testing with an alternate test  methodology [phone]) is advised. The SARS-CoV-2 RNA is generally  detectable in upper and lower respiratory sp ecimens during the acute  phase of infection. The expected result is Negative. Fact Sheet for Patients:  StrictlyIdeas.no Fact Sheet for Healthcare Providers: BankingDealers.co.za This test is not yet approved or cleared by the [place] FDA and has been authorized for detection and/or diagnosis of SARS-CoV-2 by FDA under an Emergency Use Authorization (EUA).  This EUA will remain in effect (meaning this test can be used) for the duration of the COVID-19 declaration under Section 564(b)(1) of the Act, 21 U.S.C. section 360bbb-3(b)(1), unless the authorization is terminated or revoked sooner. Performed at [facility], [address] [place], [place] [address]   MRSA PCR Screening     Status: None   Collection Time: 03/09/19  2:48 AM  Result Value Ref Range Status   MRSA by PCR NEGATIVE NEGATIVE Final    Comment:        The GeneXpert MRSA Assay (FDA approved for NASAL specimens only), is one component of a comprehensive  MRSA colonization surveillance program. It is not intended to diagnose MRSA infection nor to guide or monitor treatment for MRSA infections. Performed at [facility], [address] [place], [place] [address]   Culture, respiratory (non-expectorated)     Status: None   Collection Time: 03/09/19  8:00 AM  Result Value Ref Range Status   Specimen Description   Final    TRACHEAL ASPIRATE Performed at [facility], [address] [place], [place] [address]    Special Requests   Final    NONE Performed at [facility], [address] [place], [place] [address]    Gram Stain   Final    ABUNDANT WBC PRESENT,BOTH PMN AND MONONUCLEAR MODERATE GRAM POSITIVE COCCI RARE YEAST    Culture   Final    MODERATE GROUP B STREP(S.AGALACTIAE)ISOLATED TESTING AGAINST S. AGALACTIAE NOT ROUTINELY PERFORMED DUE TO PREDICTABILITY OF AMP/PEN/VAN SUSCEPTIBILITY. Performed at [facility], [place] [address], [place], [place] [address]    Report Status 03/11/2019 FINAL  Final  CULTURE, BLOOD (ROUTINE X 2) w Reflex to ID Panel     Status: None (Preliminary result)   Collection Time: 03/09/19  2:53 PM  Result Value Ref Range Status   Specimen Description   Final    BLOOD BLOOD RIGHT ARM Performed at [facility], [address] [place],  [address]    Special Requests   Final  BOTTLES DRAWN AEROBIC AND ANAEROBIC Blood Culture results may not be optimal due to an excessive volume of blood received in culture bottles Performed at [facility], [address] [place], [place] [address]    Culture  Setup Time PENDING  Incomplete   Culture   Final    CULTURE REINCUBATED FOR BETTER GROWTH Performed at [facility], [place] [address] [place], [place] [address]    Report Status PENDING  Incomplete  CULTURE, BLOOD (ROUTINE X 2) w Reflex to ID Panel     Status: None (Preliminary result)   Collection Time: 03/09/19  5:00 PM  Result Value  Ref Range Status   Specimen Description   Final    BLOOD RIGHT ARM Performed at [facility], [address] [place], [place] [address]    Special Requests   Final    BOTTLES DRAWN AEROBIC AND ANAEROBIC Blood Culture results may not be optimal due to an excessive volume of blood received in culture bottles Performed at [facility], [address], [place], [place] [address]    Culture  Setup Time PENDING  Incomplete   Culture   Final    CULTURE REINCUBATED FOR BETTER GROWTH Performed at [facility] [address], [place], [place] [address]    Report Status PENDING  Incomplete  Urine Culture     Status: None   Collection Time: 03/11/19  4:33 AM  Result Value Ref Range Status   Specimen Description   Final    URINE, RANDOM Performed at [facility], [address], [place], [place] [address]    Special Requests   Final    NONE Performed at [facility], [address] [place], [place] [address]    Culture   Final    NO GROWTH Performed at [facility], [place] [address] [place],  [address]    Report Status 03/12/2019 FINAL  Final          IMAGING    Dg Abd 1 View  Result Date: 03/14/2019 CLINICAL DATA:  28 year old male with history of abdominal pain. EXAM: ABDOMEN - 1 VIEW COMPARISON:  03/09/2019. FINDINGS: Right femoral central venous catheter with tip projecting over the expected location of the proximal right common femoral vein. Gas and stool are seen scattered throughout the colon extending to the level of the distal rectum. No pathologic distension of small bowel is noted. Several nondilated gas-filled loops of small bowel or noted projecting over the central abdomen. No gross evidence of pneumoperitoneum. IMPRESSION: 1. Nonspecific, nonobstructive bowel gas pattern, as above. 2. No pneumoperitoneum. Electronically Signed   By: Vinnie Langton M.D.   On: 03/14/2019 22:34           ASSESSMENT AND  PLAN SYNOPSIS  28 year old white male admitted to the ICU for acute drug overdose and toxicity from calcium channel blocker in the setting of multiorgan failure with severehypoxicrespiratory failure,distributive shock and renal failure with metabolic encephalopathy   Severe ACUTE Hypoxic and Hypercapnic Respiratory Failure-RESOLVED -continue Full MV support -continue Bronchodilator Therapy -Wean Fio2 and PEEP as tolerated -will perform SAT/SBT when respiratory parameters are met   ACUTE KIDNEY INJURY/Renal Failure -follow chem 7 -follow UO -continue Foley Catheter-assess need -Avoid nephrotoxic agents -Recheck creatinine  HD as needed  GBS pneumonia continue IV abx  NEUROLOGY-ENCEPHAL PATHY RESOLVED    SHOCK-SEPSIS/HYPOVOLUMIC/CARDIOGENIC-RESOLVED   ID -continue IV abx as prescibed -follow up cultures  GI GI PROPHYLAXIS as indicated  NUTRITIONAL  STATUS DIET--> as tolerated Constipation protocol as indicated  ENDO - will use ICU hypoglycemic\Hyperglycemia protocol if indicated   ELECTROLYTES -follow labs as needed -replace as needed -pharmacy consultation and following   DVT/GI PRX ordered TRANSFUSIONS AS NEEDED MONITOR FSBS ASSESS the need for LABS as needed   OK to transfer to gen med floor  Onnika Siebel Patricia Pesa, M.D.  [facility]  Medical Director [place] Director [place] Department

## 2019-03-15 NOTE — Progress Notes (Signed)
Mother updated over phone

## 2019-03-15 NOTE — Progress Notes (Signed)
[place] [facility] [employer] NAME: Glenn Rasmussen    MR#:  [id number]  DATE OF BIRTH:  09-07-1991  SUBJECTIVE:   -Patient transferred out of ICU. Asking me when he is currently going home. Sitter in the room.  REVIEW OF SYSTEMS:  Review of Systems  Constitutional: Negative for chills, fever and weight loss.  HENT: Negative for ear discharge, ear pain and nosebleeds.   Eyes: Negative for blurred vision, pain and discharge.  Respiratory: Negative for sputum production, shortness of breath, wheezing and stridor.   Cardiovascular: Negative for chest pain, palpitations, orthopnea and PND.  Gastrointestinal: Negative for abdominal pain, diarrhea, nausea and vomiting.  Genitourinary: Negative for frequency and urgency.  Musculoskeletal: Negative for back pain and joint pain.  Neurological: Positive for weakness. Negative for sensory change, speech change and focal weakness.  Psychiatric/Behavioral: Negative for depression and hallucinations. The patient is nervous/anxious.     DRUG ALLERGIES:  No Known Allergies  VITALS:  Blood pressure (!) 111/50, pulse (!) 103, temperature 99.7 F (37.6 C), temperature source Oral, resp. rate 20, height 5\' 7"  (1.702 m), weight 103.8 kg, SpO2 94 %.  PHYSICAL EXAMINATION:  Physical Exam   GENERAL:  28 y.o.-year-old critically ill-appearing patient lying in the bed with no acute distress. obese EYES: Pupils equal, round, reactive to light and accommodation. No scleral icterus. Extraocular muscles intact.  HEENT: Head atraumatic, normocephalic. Oropharynx and nasopharynx clear.  NECK:  Supple, no jugular venous distention. No thyroid enlargement, no tenderness.   LUNGS: Normal breath sounds bilaterally, no wheezing, rales,rhonchi or crepitation. No use of accessory muscles of respiration.  Decreased bibasilar breath sounds CARDIOVASCULAR: S1, S2 normal. No murmurs, rubs, or gallops.  ABDOMEN: Soft, nontender,  nondistended. Bowel sounds present. No organomegaly or mass.  EXTREMITIES: No  cyanosis, or clubbing.  1+ pedal edema noted NEUROLOGIC: Patient he is alert but remains oriented x2.   PSYCHIATRIC: The patient is alert and oriented x2 SKIN: No obvious rash, lesion, or ulcer.    LABORATORY PANEL:   CBC Recent Labs  Lab 03/15/19 0434  WBC 13.7*  HGB 7.1*  HCT 21.2*  PLT 200   ------------------------------------------------------------------------------------------------------------------  Chemistries  Recent Labs  Lab 03/14/19 0430  03/15/19 0434  NA 138   < > 138  K 3.2*   < > 3.2*  CL 104   < > 104  CO2 24   < > 23  GLUCOSE 100*   < > 109*  BUN 41*   < > 48*  CREATININE 3.05*   < > 4.07*  CALCIUM 8.2*   < > 8.3*  MG 2.2  --  2.3  AST 38  --   --   ALT 35  --   --   ALKPHOS 39  --   --   BILITOT 0.7  --   --    < > = values in this interval not displayed.   ------------------------------------------------------------------------------------------------------------------  Cardiac Enzymes Recent Labs  Lab 03/14/19 0430  TROPONINI 0.25*   ------------------------------------------------------------------------------------------------------------------  RADIOLOGY:  Dg Abd 1 View  Result Date: 03/14/2019 CLINICAL DATA:  28 year old male with history of abdominal pain. EXAM: ABDOMEN - 1 VIEW COMPARISON:  03/09/2019. FINDINGS: Right femoral central venous catheter with tip projecting over the expected location of the proximal right common femoral vein. Gas and stool are seen scattered throughout the colon extending to the level of the distal rectum. No pathologic distension of small bowel is noted. Several nondilated gas-filled  loops of small bowel or noted projecting over the central abdomen. No gross evidence of pneumoperitoneum. IMPRESSION: 1. Nonspecific, nonobstructive bowel gas pattern, as above. 2. No pneumoperitoneum. Electronically Signed   By: Vinnie Langton  M.D.   On: 03/14/2019 22:34    EKG:   Orders placed or performed during the hospital encounter of 03/08/19  . ED EKG  . ED EKG  . ED EKG  . ED EKG  . EKG 12-Lead  . EKG 12-Lead  . EKG 12-Lead  . EKG 12-Lead  . EKG 12-Lead  . EKG 12-Lead  . EKG 12-Lead  . EKG 12-Lead  . EKG 12-Lead  . EKG 12-Lead  . EKG 12-Lead  . EKG 12-Lead  . EKG 12-Lead  . EKG 12-Lead    ASSESSMENT AND PLAN:   28 year old male with past medical history significant for hypertension, IBS admitted secondary to overdose on Phenergan, losartan, Norvasc and Klonopin  1.  Acute respiratory failure-intubated for airway protection secondary to acute metabolic encephalopathy -Patient is extubated on 03/12/2019, management per ICU team -Alert and following commands.  Oriented x2   2.  Intentional drug overdose -patient overdosed on 38 tablets of Phenergan, 19 tablets of losartan, 42 tablets of Norvasc and 78 tablets of Klonopin -Urine tox positive for benzos and tricyclics -Poison control has been contacted, management per ICU team at this time.  - hypotensive-remains on  -Levophed -On stress dose steroids and also albumin infusion -Appreciate psych consult.  Once patient is medically stable, will need inpatient psychiatry admission.  3.  Acute renal failure-likely ATN from hypotension.  -Started on CRRT, metabolic acidosis.  Appreciate nephrology consult - CRRTon hold for now  4.   Pneumonia-follow-up procalcitonin and WBC. - Currently on Rocephin -Significantly improving WBC  5.  DVT prophylaxis-on heparin drip  6.  GERD-Protonix  7.  Elevated troponin-likely demand ischemia, troponin plateaued vs due to renal failure -  Most recent echo this admission showing normal LV function and no wall motion abnormalities. -d/ced heparin gtt   CODE STATUS: Full code  TOTAL TIME TAKING CARE OF THIS PATIENT: 30 minutes.   POSSIBLE D/C IN ? DAYS, DEPENDING ON CLINICAL CONDITION.   Fritzi Mandes M.D on  03/15/2019 at 2:32 PM  Between 7am to 6pm - Pager - [phone]  After 6pm go to [email or web address] - password [email or web address]  Office  [phone]  CC: Primary care physician; Maeola Sarah, MD

## 2019-03-15 NOTE — Consult Note (Signed)
[facility] Consult   Reason for Consult: Suicide attempt by overdose Referring Physician: ICU provider Patient Identification: Glenn Rasmussen MRN:  [id number] Principal Diagnosis: Intentional overdose Diagnosis:  Active Problems:   MDD (major depressive disorder), recurrent severe, without psychosis ([place])   Multiple drug overdose   Suicide attempt ([place])   HTN (hypertension)   AKI (acute kidney injury) ([place])   Acute respiratory failure ([place])   Antihypertensive agent overdose   Calcium channel blocker overdose   Intentional benzodiazepine overdose ([place])  Patient is seen, chart is reviewed.    Total Time spent with patient: 15 minutes  Subjective:  "I'm ok, need this gas to pass" When asked about his mood, he reports he feels a "lot better".  Sleep is not "great".  Anxiety is "not bad".  HPI: On admission to ED: Glenn Rasmussen is a 28 y.o. male patient admitted with drug overdose and suicide attempt.  Patient presents to the ED reporting multiple drug overdose.  He states that he is depressed and "just does not care about life anymore".  He states that he took a number of medications tonight including benzodiazepine, calcium channel blocker, Phenergan, amlodipine, and "just did not care what happened".  He is currently hypotensive, though awake and alert.  Treatment for his overdose was initiated in the ED and hospitalist were called for admission.Marland Kitchen  Psychiatry consult is requested for evaluation and further management  Past Psychiatric History: Per medication review, patient appears to be treated for ADHD, anxiety, and insomnia.  On evaluation 03/12/19, patient has recently been extubated.  His mother is at his bedside.  He is coordinating a video phone conference with his brother.  Patient is smiling, and expressing gratitude to have survived.  He endorses that he did have a suicide attempt by drug overdose.  Patient is currently denying suicidal ideation, plan or  intent.  He denies HI.  He denies AVH.  Discussed with patient and mother plan for patient to medically stabilize while in the ICU, and then transferred to inpatient psychiatry.  Patient is agreeable to this plan.  03/13/19:  Patient seen and evaluated.  He wanted his mother to stay at the bedside.  She was attentive to him and worked to reorient him.  He was visible anxious with tube in nose and IVs in his neck.  Pleasant and smiled frequently but confused with the time and situation.  He could reorient with assistance.  Agreeable to inpatient psychiatry when medically stable.  Difficult to get more than a basic assessment due to his anxiety, medical condition, and confusion.  Mother was helpful and hoping to stay with him if the hospital agreed and she could get a recliner.  If not she would stay until she had to go to bed.  03/14/19:  Patient seen and evaluated.  He is less anxious today and less confused than yesterday.  Focused on current medical situation with tubes and PICC line in his neck.  He is waiting for his mother to come visit.  Pleasant and understands his medical issues will be addressed prior to his depression.  03/15/19:  Patient seen and evaluated.  He was transferred to the medical floor and focused on his current medical needs.  Some confusion at times.  Pleasant and grateful with some anxiety and increase in respirations on movement in bed.  Sitter at his bedside assists with clarifying his communication.  He reports his anxiety is "not bad" today.    Risk to Self:  Yes Risk to Others:  Not known Prior Inpatient Therapy:  Not known Prior Outpatient Therapy:  Not known  Past Medical History:  Past Medical History:  Diagnosis Date  . Hypertension   . IBS (irritable bowel syndrome)     Past Surgical History:  Procedure Laterality Date  . APPENDECTOMY    . CHOLECYSTECTOMY    . TONSILLECTOMY     Family History:  Family History  Problem Relation Age of Onset  . Lung cancer  Mother   . Lung cancer Father    Family Psychiatric  History: Unknown  Social History:  Social History   Substance and Sexual Activity  Alcohol Use No  . Frequency: Never     Social History   Substance and Sexual Activity  Drug Use No    Social History   Socioeconomic History  . Marital status: Single    Spouse name: Not on file  . Number of children: Not on file  . Years of education: Not on file  . Highest education level: Not on file  Occupational History  . Not on file  Social Needs  . Financial resource strain: Not on file  . Food insecurity:    Worry: Not on file    Inability: Not on file  . Transportation needs:    Medical: Not on file    Non-medical: Not on file  Tobacco Use  . Smoking status: Never Smoker  . Smokeless tobacco: Never Used  Substance and Sexual Activity  . Alcohol use: No    Frequency: Never  . Drug use: No  . Sexual activity: Not on file  Lifestyle  . Physical activity:    Days per week: Not on file    Minutes per session: Not on file  . Stress: Not on file  Relationships  . Social connections:    Talks on phone: Not on file    Gets together: Not on file    Attends religious service: Not on file    Active member of club or organization: Not on file    Attends meetings of clubs or organizations: Not on file    Relationship status: Not on file  Other Topics Concern  . Not on file  Social History Narrative  . Not on file   Additional Social History:    Unknown  Allergies:  No Known Allergies  Labs:  Results for orders placed or performed during the hospital encounter of 03/08/19 (from the past 48 hour(s))  Glucose, capillary     Status: Abnormal   Collection Time: 03/13/19  3:39 PM  Result Value Ref Range   Glucose-Capillary 115 (H) 70 - 99 mg/dL  Renal function panel     Status: Abnormal   Collection Time: 03/13/19  5:32 PM  Result Value Ref Range   Sodium 135 135 - 145 mmol/L   Potassium 3.4 (L) 3.5 - 5.1 mmol/L    Chloride 102 98 - 111 mmol/L   CO2 20 (L) 22 - 32 mmol/L   Glucose, Bld 126 (H) 70 - 99 mg/dL   BUN 45 (H) 6 - 20 mg/dL   Creatinine, Ser 3.56 (H) 0.61 - 1.24 mg/dL   Calcium 8.0 (L) 8.9 - 10.3 mg/dL   Phosphorus 3.4 2.5 - 4.6 mg/dL   Albumin 3.3 (L) 3.5 - 5.0 g/dL   GFR calc non Af Amer 22 (L) >60 mL/min   GFR calc Af Amer 25 (L) >60 mL/min   Anion gap 13 5 - 15  Comment: Performed at [facility], [place], [place] [address]  Heparin level (unfractionated)     Status: Abnormal   Collection Time: 03/13/19  5:32 PM  Result Value Ref Range   Heparin Unfractionated 0.28 (L) 0.30 - 0.70 IU/mL    Comment: (NOTE) If heparin results are below expected values, and patient dosage has  been confirmed, suggest follow up testing of antithrombin III levels. Performed at [facility], [address] [place], [place] [address]   Glucose, capillary     Status: Abnormal   Collection Time: 03/13/19  7:50 PM  Result Value Ref Range   Glucose-Capillary 120 (H) 70 - 99 mg/dL  Renal function panel     Status: Abnormal   Collection Time: 03/13/19  7:51 PM  Result Value Ref Range   Sodium 137 135 - 145 mmol/L   Potassium 3.3 (L) 3.5 - 5.1 mmol/L   Chloride 105 98 - 111 mmol/L   CO2 21 (L) 22 - 32 mmol/L   Glucose, Bld 124 (H) 70 - 99 mg/dL   BUN 44 (H) 6 - 20 mg/dL   Creatinine, Ser 3.29 (H) 0.61 - 1.24 mg/dL   Calcium 8.0 (L) 8.9 - 10.3 mg/dL   Phosphorus 3.4 2.5 - 4.6 mg/dL   Albumin 3.2 (L) 3.5 - 5.0 g/dL   GFR calc non Af Amer 24 (L) >60 mL/min   GFR calc Af Amer 28 (L) >60 mL/min   Anion gap 11 5 - 15    Comment: Performed at [facility], [address] [place], [place] [address]  Glucose, capillary     Status: None   Collection Time: 03/13/19 11:35 PM  Result Value Ref Range   Glucose-Capillary 99 70 - 99 mg/dL  Renal function panel     Status: Abnormal   Collection Time: 03/13/19 11:36 PM  Result Value Ref Range   Sodium 139 135 -  145 mmol/L   Potassium 3.3 (L) 3.5 - 5.1 mmol/L   Chloride 105 98 - 111 mmol/L   CO2 21 (L) 22 - 32 mmol/L   Glucose, Bld 110 (H) 70 - 99 mg/dL   BUN 43 (H) 6 - 20 mg/dL   Creatinine, Ser 3.32 (H) 0.61 - 1.24 mg/dL   Calcium 8.4 (L) 8.9 - 10.3 mg/dL   Phosphorus 3.0 2.5 - 4.6 mg/dL   Albumin 3.4 (L) 3.5 - 5.0 g/dL   GFR calc non Af Amer 24 (L) >60 mL/min   GFR calc Af Amer 27 (L) >60 mL/min   Anion gap 13 5 - 15    Comment: Performed at [facility], [address] [place], [place] [address]  Blood gas, arterial     Status: Abnormal   Collection Time: 03/13/19 11:51 PM  Result Value Ref Range   FIO2 0.45    Delivery systems HI FLOW NASAL CANNULA    pH, Arterial 7.46 (H) 7.350 - 7.450   pCO2 arterial 34 32.0 - 48.0 mmHg   pO2, Arterial 67 (L) 83.0 - 108.0 mmHg   Bicarbonate 24.2 20.0 - 28.0 mmol/L   Acid-Base Excess 0.8 0.0 - 2.0 mmol/L   O2 Saturation 94.1 %   Patient temperature 37.0    Collection site A-LINE    Sample type ARTERIAL DRAW    Allens test (pass/fail) PASS PASS    Comment: Performed at [facility], [address] [place], [place] [address]  Magnesium     Status: None   Collection  Time: 03/14/19  4:30 AM  Result Value Ref Range   Magnesium 2.2 1.7 - 2.4 mg/dL    Comment: Performed at [facility], [address] [place], [place] [address]  CBC     Status: Abnormal   Collection Time: 03/14/19  4:30 AM  Result Value Ref Range   WBC 11.1 (H) 4.0 - 10.5 K/uL   RBC 2.58 (L) 4.22 - 5.81 MIL/uL   Hemoglobin 7.0 (L) 13.0 - 17.0 g/dL   HCT 20.2 (L) 39.0 - 52.0 %   MCV 78.3 (L) 80.0 - 100.0 fL   MCH 27.1 26.0 - 34.0 pg   MCHC 34.7 30.0 - 36.0 g/dL   RDW 12.9 11.5 - 15.5 %   Platelets 154 150 - 400 K/uL   nRBC 0.3 (H) 0.0 - 0.2 %    Comment: Performed at [facility], [address] [place], [place] [address]  Comprehensive metabolic panel     Status: Abnormal   Collection Time: 03/14/19  4:30 AM  Result Value Ref Range    Sodium 138 135 - 145 mmol/L   Potassium 3.2 (L) 3.5 - 5.1 mmol/L   Chloride 104 98 - 111 mmol/L   CO2 24 22 - 32 mmol/L   Glucose, Bld 100 (H) 70 - 99 mg/dL   BUN 41 (H) 6 - 20 mg/dL   Creatinine, Ser 3.05 (H) 0.61 - 1.24 mg/dL   Calcium 8.2 (L) 8.9 - 10.3 mg/dL   Total Protein 5.6 (L) 6.5 - 8.1 g/dL   Albumin 3.1 (L) 3.5 - 5.0 g/dL   AST 38 15 - 41 U/L   ALT 35 0 - 44 U/L   Alkaline Phosphatase 39 38 - 126 U/L   Total Bilirubin 0.7 0.3 - 1.2 mg/dL   GFR calc non Af Amer 26 (L) >60 mL/min   GFR calc Af Amer 30 (L) >60 mL/min   Anion gap 10 5 - 15    Comment: Performed at [facility], [address] [place], [place] [address]  Troponin I - Tomorrow AM 0500     Status: Abnormal   Collection Time: 03/14/19  4:30 AM  Result Value Ref Range   Troponin I 0.25 (HH) <0.03 ng/mL    Comment: CRITICAL VALUE NOTED. VALUE IS CONSISTENT WITH PREVIOUSLY REPORTED/CALLED VALUE RWW Performed at [facility], [place], [place] [address]   Heparin level (unfractionated)     Status: Abnormal   Collection Time: 03/14/19  4:30 AM  Result Value Ref Range   Heparin Unfractionated 0.29 (L) 0.30 - 0.70 IU/mL    Comment: (NOTE) If heparin results are below expected values, and patient dosage has  been confirmed, suggest follow up testing of antithrombin III levels. Performed at [facility], [address] [place], [place] [address]   Glucose, capillary     Status: Abnormal   Collection Time: 03/14/19  4:36 AM  Result Value Ref Range   Glucose-Capillary 101 (H) 70 - 99 mg/dL  Glucose, capillary     Status: None   Collection Time: 03/14/19  7:37 AM  Result Value Ref Range   Glucose-Capillary 94 70 - 99 mg/dL  Renal function panel     Status: Abnormal   Collection Time: 03/14/19  8:03 AM  Result Value Ref Range   Sodium 138 135 - 145 mmol/L   Potassium 3.5 3.5 - 5.1 mmol/L   Chloride 104 98 - 111 mmol/L   CO2 21 (L) 22 -  32 mmol/L   Glucose, Bld  118 (H) 70 - 99 mg/dL   BUN 39 (H) 6 - 20 mg/dL   Creatinine, Ser 3.01 (H) 0.61 - 1.24 mg/dL   Calcium 8.5 (L) 8.9 - 10.3 mg/dL   Phosphorus 2.7 2.5 - 4.6 mg/dL   Albumin 3.4 (L) 3.5 - 5.0 g/dL   GFR calc non Af Amer 27 (L) >60 mL/min   GFR calc Af Amer 31 (L) >60 mL/min   Anion gap 13 5 - 15    Comment: Performed at [facility], [address] [place], [place] [address]  Glucose, capillary     Status: None   Collection Time: 03/14/19 11:49 AM  Result Value Ref Range   Glucose-Capillary 95 70 - 99 mg/dL  Heparin level (unfractionated)     Status: Abnormal   Collection Time: 03/14/19 12:00 PM  Result Value Ref Range   Heparin Unfractionated 0.29 (L) 0.30 - 0.70 IU/mL    Comment: (NOTE) If heparin results are below expected values, and patient dosage has  been confirmed, suggest follow up testing of antithrombin III levels. Performed at [facility], [address] [place], [place] [address]   Renal function panel     Status: Abnormal   Collection Time: 03/14/19 12:25 PM  Result Value Ref Range   Sodium 137 135 - 145 mmol/L   Potassium 3.1 (L) 3.5 - 5.1 mmol/L   Chloride 102 98 - 111 mmol/L   CO2 23 22 - 32 mmol/L   Glucose, Bld 118 (H) 70 - 99 mg/dL   BUN 38 (H) 6 - 20 mg/dL   Creatinine, Ser 3.03 (H) 0.61 - 1.24 mg/dL   Calcium 8.5 (L) 8.9 - 10.3 mg/dL   Phosphorus 2.4 (L) 2.5 - 4.6 mg/dL   Albumin 3.7 3.5 - 5.0 g/dL   GFR calc non Af Amer 26 (L) >60 mL/min   GFR calc Af Amer 31 (L) >60 mL/min   Anion gap 12 5 - 15    Comment: Performed at [facility], [address] [place], [place] [address]  Glucose, capillary     Status: None   Collection Time: 03/14/19  3:54 PM  Result Value Ref Range   Glucose-Capillary 79 70 - 99 mg/dL  Renal function panel     Status: Abnormal   Collection Time: 03/14/19  4:02 PM  Result Value Ref Range   Sodium 137 135 - 145 mmol/L   Potassium 3.1 (L) 3.5 - 5.1 mmol/L   Chloride 103 98 - 111 mmol/L   CO2  24 22 - 32 mmol/L   Glucose, Bld 108 (H) 70 - 99 mg/dL   BUN 37 (H) 6 - 20 mg/dL   Creatinine, Ser 3.02 (H) 0.61 - 1.24 mg/dL   Calcium 8.6 (L) 8.9 - 10.3 mg/dL   Phosphorus 2.3 (L) 2.5 - 4.6 mg/dL   Albumin 3.4 (L) 3.5 - 5.0 g/dL   GFR calc non Af Amer 26 (L) >60 mL/min   GFR calc Af Amer 31 (L) >60 mL/min   Anion gap 10 5 - 15    Comment: Performed at [facility], [address] [place], [place] [address]  Glucose, capillary     Status: Abnormal   Collection Time: 03/14/19  7:44 PM  Result Value Ref Range   Glucose-Capillary 115 (H) 70 - 99 mg/dL  Magnesium     Status: None   Collection Time: 03/15/19  4:34 AM  Result Value Ref Range  Magnesium 2.3 1.7 - 2.4 mg/dL    Comment: Performed at [facility], [address] [place], [place] [address]  CBC     Status: Abnormal   Collection Time: 03/15/19  4:34 AM  Result Value Ref Range   WBC 13.7 (H) 4.0 - 10.5 K/uL   RBC 2.65 (L) 4.22 - 5.81 MIL/uL   Hemoglobin 7.1 (L) 13.0 - 17.0 g/dL   HCT 21.2 (L) 39.0 - 52.0 %   MCV 80.0 80.0 - 100.0 fL   MCH 26.8 26.0 - 34.0 pg   MCHC 33.5 30.0 - 36.0 g/dL   RDW 13.2 11.5 - 15.5 %   Platelets 200 150 - 400 K/uL   nRBC 0.4 (H) 0.0 - 0.2 %    Comment: Performed at [facility], [address] [place], [place] [address]  Basic metabolic panel     Status: Abnormal   Collection Time: 03/15/19  4:34 AM  Result Value Ref Range   Sodium 138 135 - 145 mmol/L   Potassium 3.2 (L) 3.5 - 5.1 mmol/L   Chloride 104 98 - 111 mmol/L   CO2 23 22 - 32 mmol/L   Glucose, Bld 109 (H) 70 - 99 mg/dL   BUN 48 (H) 6 - 20 mg/dL   Creatinine, Ser 4.07 (H) 0.61 - 1.24 mg/dL   Calcium 8.3 (L) 8.9 - 10.3 mg/dL   GFR calc non Af Amer 18 (L) >60 mL/min   GFR calc Af Amer 21 (L) >60 mL/min   Anion gap 11 5 - 15    Comment: Performed at [facility], [address] [place], [place] [address]  Glucose, capillary     Status: Abnormal   Collection Time: 03/15/19  7:55 AM   Result Value Ref Range   Glucose-Capillary 111 (H) 70 - 99 mg/dL    Current Facility-Administered Medications  Medication Dose Route Frequency Provider Last Rate Last Dose  . albumin human 25 % solution 12.5 g  12.5 g Intravenous BID Murlean Iba, MD 60 mL/hr at 03/15/19 0900 12.5 g at 03/15/19 0900  . bisacodyl (DULCOLAX) suppository 10 mg  10 mg Rectal Daily PRN Tukov-Yual, Magdalene S, NP      . cefTRIAXone (ROCEPHIN) 2 g in sodium chloride 0.9 % 100 mL IVPB  2 g Intravenous Q24H Flora Lipps, MD 200 mL/hr at 03/14/19 1758 2 g at 03/14/19 1758  . Chlorhexidine Gluconate Cloth 2 % PADS 6 each  6 each Topical Q0600 Lance Coon, MD   6 each at 03/14/19 0645  . heparin injection 1,000-6,000 Units  1,000-6,000 Units CRRT PRN Murlean Iba, MD   4,000 Units at 03/14/19 2053  . ipratropium-albuterol (DUONEB) 0.5-2.5 (3) MG/3ML nebulizer solution 3 mL  3 mL Nebulization Q6H PRN Flora Lipps, MD      . LORazepam (ATIVAN) injection 1 mg  1 mg Intravenous Q4H PRN Tukov-Yual, Magdalene S, NP      . morphine 2 MG/ML injection 2 mg  2 mg Intravenous Q2H PRN Darel Hong D, NP   2 mg at 03/14/19 2053  . senna (SENOKOT) tablet 8.6 mg  1 tablet Oral Daily PRN Flora Lipps, MD   8.6 mg at 03/15/19 1403  . sodium chloride flush (NS) 0.9 % injection 10-40 mL  10-40 mL Intracatheter PRN Tukov-Yual, Magdalene S, NP      . traZODone (DESYREL) tablet 100 mg  100 mg Oral QHS PRN Tukov-Yual, Magdalene S, NP   100 mg at 03/14/19 2231  Musculoskeletal: Strength & Muscle Tone: decreased Gait & Station: Not assessed Patient leans: N/A  Psychiatric Specialty Exam: Physical Exam  Nursing note and vitals reviewed. Constitutional: He appears well-developed and well-nourished.  HENT:  Head: Normocephalic and atraumatic.  Eyes: EOM are normal.  Neck: Normal range of motion.  Cardiovascular: Regular rhythm.  Respiratory: Effort normal. No respiratory distress.  With respiratory pressure support   Musculoskeletal: Normal range of motion.  Neurological: He is alert.  Psychiatric: His speech is normal. His mood appears anxious. Cognition and memory are impaired. He expresses impulsivity. He exhibits a depressed mood. He expresses suicidal ideation. He expresses suicidal plans. He is inattentive.    Review of Systems  Respiratory: Positive for shortness of breath.   Neurological: Positive for weakness.  Psychiatric/Behavioral: Positive for depression and suicidal ideas. Negative for hallucinations and substance abuse. The patient is nervous/anxious. The patient does not have insomnia.   All other systems reviewed and are negative.   Blood pressure (!) 111/50, pulse (!) 103, temperature 99.7 F (37.6 C), temperature source Oral, resp. rate 20, height 5\' 7"  (1.702 m), weight 103.8 kg, SpO2 94 %.Body mass index is 35.84 kg/m.  General Appearance: Casual  Eye Contact:  Fair  Speech:  Clear and Coherent  Volume:  Normal  Mood:  Anxious, depression  Affect:  Congruent  Thought Process:  Altered, confused at times  Orientation:  Person, place  Thought Content:  Hallucinations: None  Suicidal Thoughts:  Denies today, however admission for intentional drug overdose as suicide attempt.  Homicidal Thoughts:  No  Memory:  Poor  Judgement:  Poor  Insight:  Present and Shallow  Psychomotor Activity:  Decreased  Concentration:  Concentration: Fair  Recall:  [employer] of Knowledge:  Fair  Language:  Good  Akathisia:  No  Handed:  Right  AIMS (if indicated):     Assets:  Social Support patient has given consent to talk to mother, [name] [phone]  ADL's:  Impaired  Cognition:  Impaired,  Mild  Sleep:   Adequate    Treatment Plan Summary: Daily contact with patient to assess and evaluate symptoms and progress in treatment and Medication management Major depressive disorder, recurrent,severe without psychosis: Hold psychiatric medication at this time until patient more  medically stable.  -inpatient psychiatric admission when medically cleared  Disposition: Recommend psychiatric Inpatient admission when medically cleared. Supportive therapy provided about ongoing stressors.  patient has given consent to talk to mother, [name] [phone]  Psychiatry will continue to follow and advise medication management as indicated.  Waylan Boga, NP 03/15/2019 3:28 PM

## 2019-03-16 ENCOUNTER — Inpatient Hospital Stay: Payer: BC Managed Care – PPO

## 2019-03-16 LAB — CBC WITH DIFFERENTIAL/PLATELET
Abs Immature Granulocytes: 0.89 10*3/uL — ABNORMAL HIGH (ref 0.00–0.07)
Basophils Absolute: 0.1 10*3/uL (ref 0.0–0.1)
Basophils Relative: 0 %
Eosinophils Absolute: 0.1 10*3/uL (ref 0.0–0.5)
Eosinophils Relative: 1 %
HCT: 23.3 % — ABNORMAL LOW (ref 39.0–52.0)
Hemoglobin: 7.9 g/dL — ABNORMAL LOW (ref 13.0–17.0)
Immature Granulocytes: 4 %
Lymphocytes Relative: 12 %
Lymphs Abs: 2.7 10*3/uL (ref 0.7–4.0)
MCH: 27.3 pg (ref 26.0–34.0)
MCHC: 33.9 g/dL (ref 30.0–36.0)
MCV: 80.6 fL (ref 80.0–100.0)
Monocytes Absolute: 1.8 10*3/uL — ABNORMAL HIGH (ref 0.1–1.0)
Monocytes Relative: 8 %
Neutro Abs: 16.2 10*3/uL — ABNORMAL HIGH (ref 1.7–7.7)
Neutrophils Relative %: 75 %
Platelets: 327 10*3/uL (ref 150–400)
RBC: 2.89 MIL/uL — ABNORMAL LOW (ref 4.22–5.81)
RDW: 13.3 % (ref 11.5–15.5)
WBC: 21.8 10*3/uL — ABNORMAL HIGH (ref 4.0–10.5)
nRBC: 0.3 % — ABNORMAL HIGH (ref 0.0–0.2)

## 2019-03-16 LAB — TROPONIN I
Troponin I: 0.31 ng/mL (ref <0.03)
Troponin I: 0.26 ng/mL (ref <0.03)
Troponin I: 0.22 ng/mL (ref <0.03)
Troponin I: 0.33 ng/mL (ref <0.03)
Troponin I: 0.26 ng/mL (ref <0.03)

## 2019-03-16 LAB — COMPREHENSIVE METABOLIC PANEL
ALT: 43 U/L (ref 0–44)
AST: 59 U/L — ABNORMAL HIGH (ref 15–41)
Albumin: 3.8 g/dL (ref 3.5–5.0)
Alkaline Phosphatase: 52 U/L (ref 38–126)
Anion gap: 17 — ABNORMAL HIGH (ref 5–15)
BUN: 74 mg/dL — ABNORMAL HIGH (ref 6–20)
CO2: 18 mmol/L — ABNORMAL LOW (ref 22–32)
Calcium: 8.5 mg/dL — ABNORMAL LOW (ref 8.9–10.3)
Chloride: 99 mmol/L (ref 98–111)
Creatinine, Ser: 6.08 mg/dL — ABNORMAL HIGH (ref 0.61–1.24)
GFR calc Af Amer: 13 mL/min — ABNORMAL LOW (ref >60)
GFR calc non Af Amer: 11 mL/min — ABNORMAL LOW (ref >60)
Glucose, Bld: 111 mg/dL — ABNORMAL HIGH (ref 70–99)
Potassium: 3.4 mmol/L — ABNORMAL LOW (ref 3.5–5.1)
Sodium: 134 mmol/L — ABNORMAL LOW (ref 135–145)
Total Bilirubin: 1.1 mg/dL (ref 0.3–1.2)
Total Protein: 6.8 g/dL (ref 6.5–8.1)

## 2019-03-16 LAB — PROTIME-INR
INR: 1.1 (ref 0.8–1.2)
Prothrombin Time: 14 seconds (ref 11.4–15.2)

## 2019-03-16 LAB — GLUCOSE, CAPILLARY: Glucose-Capillary: 111 mg/dL — ABNORMAL HIGH (ref 70–99)

## 2019-03-16 LAB — BLOOD GAS, ARTERIAL
Acid-base deficit: 3.4 mmol/L — ABNORMAL HIGH (ref 0.0–2.0)
Bicarbonate: 17.9 mmol/L — ABNORMAL LOW (ref 20.0–28.0)
FIO2: 40
O2 Saturation: 95.5 %
Patient temperature: 37
pCO2 arterial: 23 mmHg — ABNORMAL LOW (ref 32.0–48.0)
pH, Arterial: 7.5 — ABNORMAL HIGH (ref 7.350–7.450)
pO2, Arterial: 71 mmHg — ABNORMAL LOW (ref 83.0–108.0)

## 2019-03-16 LAB — FIBRIN DERIVATIVES D-DIMER (ARMC ONLY): Fibrin derivatives D-dimer (ARMC): 5482.03 ng/mL (FEU) — ABNORMAL HIGH (ref 0.00–499.00)

## 2019-03-16 LAB — PHOSPHORUS: Phosphorus: 5.4 mg/dL — ABNORMAL HIGH (ref 2.5–4.6)

## 2019-03-16 LAB — APTT: aPTT: 26 seconds (ref 24–36)

## 2019-03-16 LAB — MAGNESIUM: Magnesium: 2.5 mg/dL — ABNORMAL HIGH (ref 1.7–2.4)

## 2019-03-16 LAB — HEPARIN LEVEL (UNFRACTIONATED): Heparin Unfractionated: <0.1 IU/mL — ABNORMAL LOW (ref 0.30–0.70)

## 2019-03-16 MED ORDER — HYDROCODONE-ACETAMINOPHEN 5-325 MG PO TABS
1.0000 | ORAL_TABLET | ORAL | Status: AC
  Administered 2019-03-16: 1 via ORAL
  Filled 2019-03-16: qty 1

## 2019-03-16 MED ORDER — NITROGLYCERIN 0.4 MG SL SUBL
0.4000 mg | SUBLINGUAL_TABLET | SUBLINGUAL | Status: AC | PRN
  Administered 2019-03-16 – 2019-03-24 (×3): 0.4 mg via SUBLINGUAL
  Filled 2019-03-16 (×3): qty 1

## 2019-03-16 MED ORDER — HEPARIN (PORCINE) 25000 UT/250ML-% IV SOLN
1300.0000 [IU]/h | INTRAVENOUS | Status: DC
  Administered 2019-03-16: 1050 [IU]/h via INTRAVENOUS
  Filled 2019-03-16: qty 250

## 2019-03-16 MED ORDER — FUROSEMIDE 10 MG/ML IJ SOLN: 40.0000 mg | Freq: Once | INTRAMUSCULAR | Status: DC

## 2019-03-16 MED ORDER — TECHNETIUM TO 99M ALBUMIN AGGREGATED
4.3800 | Freq: Once | INTRAVENOUS | Status: AC | PRN
  Administered 2019-03-16: 4.38 via INTRAVENOUS

## 2019-03-16 MED ORDER — FUROSEMIDE 10 MG/ML IJ SOLN
INTRAMUSCULAR | Status: AC
  Administered 2019-03-16: 40 mg
  Filled 2019-03-16: qty 4

## 2019-03-16 MED ORDER — HEPARIN BOLUS VIA INFUSION
2650.0000 [IU] | Freq: Once | INTRAVENOUS | Status: DC
  Filled 2019-03-16: qty 2650

## 2019-03-16 MED ORDER — POTASSIUM CHLORIDE 10 MEQ/100ML IV SOLN
10.0000 meq | INTRAVENOUS | Status: AC
  Administered 2019-03-16 (×2): 10 meq via INTRAVENOUS
  Filled 2019-03-16 (×2): qty 100

## 2019-03-16 MED ORDER — TECHNETIUM TC 99M DIETHYLENETRIAME-PENTAACETIC ACID
32.1000 | Freq: Once | INTRAVENOUS | Status: AC | PRN
  Administered 2019-03-16: 32.1 via RESPIRATORY_TRACT

## 2019-03-16 MED ORDER — SODIUM CHLORIDE 0.9 % IV SOLN
INTRAVENOUS | Status: DC
  Administered 2019-03-16: 05:00:00 via INTRAVENOUS

## 2019-03-16 MED ORDER — NITROGLYCERIN 0.4 MG SL SUBL
SUBLINGUAL_TABLET | SUBLINGUAL | Status: AC
  Administered 2019-03-16: 0.4 mg
  Filled 2019-03-16: qty 1

## 2019-03-16 MED ORDER — HEPARIN BOLUS VIA INFUSION
4000.0000 [IU] | Freq: Once | INTRAVENOUS | Status: AC
  Administered 2019-03-16: 4000 [IU] via INTRAVENOUS
  Filled 2019-03-16: qty 4000

## 2019-03-16 MED ORDER — SODIUM CHLORIDE 0.9 % IV SOLN
INTRAVENOUS | Status: DC | PRN
  Administered 2019-03-16: 250 mL via INTRAVENOUS
  Administered 2019-03-17: 18:00:00 via INTRAVENOUS

## 2019-03-16 MED ORDER — ASPIRIN EC 325 MG PO TBEC
325.0000 mg | DELAYED_RELEASE_TABLET | Freq: Every day | ORAL | Status: DC
  Administered 2019-03-16: 325 mg via ORAL
  Filled 2019-03-16: qty 1

## 2019-03-16 MED ORDER — FUROSEMIDE 10 MG/ML IJ SOLN
20.0000 mg | Freq: Once | INTRAMUSCULAR | Status: AC
  Administered 2019-03-16: 20 mg via INTRAVENOUS
  Filled 2019-03-16: qty 2

## 2019-03-16 NOTE — Progress Notes (Addendum)
Critical care note,  Date of note: 03/16/2019  Subjective: The patient has been having midsternal chest pain graded 10/10 in severity described as a sharp pain with associated dyspnea and tachypnea with respiratory rate that was up to 38.  He dropped his pulse oximetry to 88% and after being placed on 40% FiO2 Venturi mask there was up to 94-98%.  He was given 2 sublingual nitroglycerin with initially dropping blood pressure with a systolic in the 38H and was also given 20 g of IV morphine sulfate that he thought that helped more.  Objective: Physical examination: Generally: Acutely ill middle-aged Caucasian male in moderate respiratory distress on Ventimask. Vital signs per history of present illness. Head - atraumatic, normocephalic.  Pupils - equal, round and reactive to light and accommodation. Extraocular movements are intact. No scleral icterus.  Oropharynx - moist mucous membranes and tongue. No pharyngeal erythema or exudate.  Neck - supple. No JVD. Carotid pulses 2+ bilaterally. No carotid bruits. No palpable thyromegaly or lymphadenopathy. Cardiovascular - regular rate and rhythm. Normal S1 and S2. No murmurs, gallops or rubs.  Lungs -slightly diminished bibasilar breath sounds with bibasilar rales. Abdomen - soft and nontender. Positive bowel sounds. No palpable organomegaly or masses.  Extremities - no pitting edema, clubbing or cyanosis.  Neuro - grossly non-focal. Skin - no rashes. GU and rectal exam - deferred.  Labs and notes and radiographic studies were reviewed.  Stat portable chest x-ray revealed increasing interstitial and airspace opacities, left greater than right with findings compatible with ARDS or infection.  It showed this stable left IJ line.  Stat EKG revealed sinus tachycardia with a rate of 107 with T wave inversion laterally.  Assessment/plan:  Acute hypoxic respiratory failure likely secondary to ARDS and possibly associated fluid overload.  The patient's  systolic blood pressure improved to 111 during my evaluation.  We ordered 20 mg of IV Lasix.  We will continue him on 40% FiO2 on Ventimask at this time.  We will check his ABG.  If he has any worsening tachypnea or respiratory distress, will consider using BiPAP.  We will continue IV Rocephin.  Chest pain, rule out acute coronary syndrome.  The patient will be admitted to an observation telemetry bed.  Will follow serial troponin I.  Will check EKG. We will obtain a cardiology consult this a.m. for further cardiac risk stratification.  The patient will be placed on aspirin as well as p.r.n. sublingual nitroglycerin and morphine sulfate for pain.  We added IV heparin bolus and infusion.  Critical care time spent: 30 minutes

## 2019-03-16 NOTE — Progress Notes (Signed)
Post HD Tx  2.5 liters removed, tolerated well. Reports improved WOB and "feeling better" post tx.     03/16/19 1715  Hand-Off documentation  Report given to (Full Name) Bethel Born RN 2A  Report received from (Full Name) Trellis Paganini RN  Vital Signs  Temp 98.4 F (36.9 C)  Temp Source Oral  Pulse Rate (!) 103  Resp (!) 33  BP (!) 115/58  BP Location Left Arm  BP Method Automatic  Patient Position (if appropriate) Lying  Oxygen Therapy  SpO2 100 %  O2 Device HFNC  O2 Flow Rate (L/min) 10 L/min  Pain Assessment  Pain Scale 0-10  Pain Score 0  Dialysis Weight  Weight 105.5 kg  Type of Weight Post-Dialysis  During Hemodialysis Assessment  Blood Flow Rate (mL/min) 250 mL/min  Arterial Pressure (mmHg) -120 mmHg  Venous Pressure (mmHg) 140 mmHg  Transmembrane Pressure (mmHg) 60 mmHg  Ultrafiltration Rate (mL/min) 860 mL/min  Dialysate Flow Rate (mL/min) 800 ml/min  Conductivity: Machine  14  HD Safety Checks Performed Yes  Intra-Hemodialysis Comments Progressing as prescribed  Post-Hemodialysis Assessment  Rinseback Volume (mL) 250 mL  Dialyzer Clearance Lightly streaked  Duration of HD Treatment -hour(s) 3.5 hour(s)  Hemodialysis Intake (mL) 500 mL  UF Total -Machine (mL) 3000 mL  Net UF (mL) 2500 mL  Tolerated HD Treatment Yes  Hemodialysis Catheter Left Internal jugular Double-lumen  Placement Date/Time: 03/09/19 2037   Placed prior to admission: No  Time Out: Correct patient;Correct site;Correct procedure  Maximum sterile barrier precautions: Sterile gown;Mask;Cap;Large sterile sheet;Sterile gloves;Hand hygiene  Site Prep: Chlorh...  Site Condition No complications  Blue Lumen Status Flushed;Capped (Central line);Heparin locked  Red Lumen Status Flushed;Capped (Central line);Heparin locked  Catheter fill solution Heparin 1000 units/ml  Dressing Type Biopatch;Occlusive  Dressing Status Clean;Dry;Intact  Interventions New dressing;Dressing changed  Drainage Description  None  Dressing Change Due 03/23/19  Post treatment catheter status Capped and Clamped

## 2019-03-16 NOTE — Progress Notes (Signed)
Pt continues to ask for morphine every 2 hours. Pt was educated that if morphine was not helping his pain then alternate methods should be considered. Pt is very vague about his pain, stating his "stomach hurts and he is sore from laying in the bed". Pt had no additional PRN pain medications. Pt asked for morphine a third time with a pain rating of 6/10 which is not within parameters of severe pain. NP made aware, one time dose of 5/325 Norco ordered. Pt educated on pain management and offered him prune juice for abdominal distress. Pt refused at this time.

## 2019-03-16 NOTE — Consult Note (Signed)
ANTICOAGULATION CONSULT NOTE - Initial Consult  Pharmacy Consult for Heparin infusion  Indication: chest pain/ACS  No Known Allergies  Patient Measurements: Height: 5\' 7"  (170.2 cm) Weight: 228 lb 13.4 oz (103.8 kg) IBW/kg (Calculated) : 66.1 Heparin Dosing Weight: 89 kg  Vital Signs: Temp: 98.2 F (36.8 C) (06/07 1925) Temp Source: Oral (06/07 1925) BP: 92/43 (06/08 0345) Pulse Rate: 108 (06/08 0345)  Labs: Recent Labs    03/13/19 1732  03/14/19 0430  03/14/19 1200  03/14/19 1602 03/15/19 0434 03/16/19 0341 03/16/19 0414  HGB  --    < > 7.0*  --   --   --   --  7.1* 7.9*  --   HCT  --   --  20.2*  --   --   --   --  21.2* 23.3*  --   PLT  --   --  154  --   --   --   --  200 327  --   APTT  --   --   --   --   --   --   --   --   --  26  LABPROT  --   --   --   --   --   --   --   --   --  14.0  INR  --   --   --   --   --   --   --   --   --  1.1  HEPARINUNFRC 0.28*  --  0.29*  --  0.29*  --   --   --   --   --   CREATININE 3.56*   < > 3.05*   < >  --    < > 3.02* 4.07* 6.08*  --   TROPONINI  --   --  0.25*  --   --   --   --   --  0.31*  --    < > = values in this interval not displayed.    Estimated Creatinine Clearance: 20.4 mL/min (A) (by C-G formula based on SCr of 6.08 mg/dL (H)).   Medical History: Past Medical History:  Diagnosis Date  . Hypertension   . IBS (irritable bowel syndrome)     Assessment: Pharmacy consulted for heparin infusion dosing and monitoring for ACS/STEMI/CP in 28 yo male.   Goal of Therapy:  Heparin level 0.3-0.7 units/ml Monitor platelets by anticoagulation protocol: Yes   Plan:  Heparin DW: 89 kg Baseline labs ordered. Hgb: 7.9 Give 4000 units bolus x 1 Start heparin infusion at 1050 units/hr Check anti-Xa level in 6 hours and daily while on heparin Continue to monitor H&H and platelets  Pernell Dupre, PharmD, BCPS Clinical Pharmacist 03/16/2019 4:46 AM

## 2019-03-16 NOTE — Consult Note (Signed)
Pharmacy Electrolyte Monitoring Consult:  Pharmacy consulted to assist in monitoring and replacing electrolytes in this      Labs:   Potassium (mmol/L)  Date Value  03/16/2019 3.4 (L)   Magnesium (mg/dL)  Date Value  03/16/2019 2.5 (H)   Phosphorus (mg/dL)  Date Value  03/14/2019 2.3 (L)   Calcium (mg/dL)  Date Value  03/16/2019 8.5 (L)   Albumin (g/dL)  Date Value  03/16/2019 3.8   Corrected Ca: 8.66 mg/dL  Assessment/Plan: 28 year old male with a medical history as indicated below who presented to the ED after taking 38 tablets of Phenergan, 19 tablets of losartan, 42 tablets of amlodipine, and 78 tablets of clonazepam and noted electrolyte abnormalities. Furosemide 40 mg IV x 1 dose ordered 6/8.   6/8: K 3.4 (0341)- patient received KCL 10 mEq x2 runs after labs was drawn. Scr 6.08 and Mg 2.5.  Nephrology following. Per nephrology, no acute indication for dialysis at present. CRRT stopped.    Will check electrolytes with AM labs.   Pharmacy will continue to monitor and adjust per consult.   Kristeen Miss PharmD Clinical Pharmacist 03/16/2019

## 2019-03-16 NOTE — Progress Notes (Signed)
Post HD Assessment  2.5 liters removed, tolerated well. Reports improved WOB and "feeling better" post tx.    03/16/19 1745  Neurological  Level of Consciousness Alert  Orientation Level Oriented X4  Respiratory  Respiratory Pattern Tachypnea (tachypnea improved)  Chest Assessment Chest expansion symmetrical  Bilateral Breath Sounds Diminished;Clear  Cardiac  Pulse Regular  Heart Sounds S1, S2;No adventitious heart sounds  ECG Monitor Yes  Cardiac Rhythm ST  Ectopy Multifocal PVC's  Ectopy Frequency Occasional  Vascular  R Radial Pulse +2  L Radial Pulse +2  Generalized Edema +2  Integumentary  Integumentary (WDL) X  Skin Color Pale  Skin Condition Dry  Musculoskeletal  Musculoskeletal (WDL) X  Generalized Weakness Yes  Gastrointestinal  Bowel Sounds Assessment Active  GU Assessment  Genitourinary (WDL) X  Genitourinary Symptoms  (HD pt first tx)  Psychosocial  Psychosocial (WDL) X  Patient Behaviors Anxious  Needs Expressed Denies  Emotional support given Given to patient

## 2019-03-16 NOTE — Progress Notes (Signed)
Patient complaining of chest pain in his midsternal/left anterior area described as sharp with a pain score 10 out of 10.  Patient became short of breath and initially tachypneic with the pain with respiratory rate 38 and oxygen desaturation to 88% less than seconds with current oxygen saturation 97%.  No radiation of the pain.  No nausea or vomiting.  He denies worsening abdominal pain.   He received NTG SL times 2 doses and Morphine 2mg  IV with some relief after the morphine.   Plan:  Stat labs including CBC, CMP, Troponin,Magnesium,  Chest xray-STAT VQscan- patient with acute renal failure R/O pulmonary embolism Heparin   Will follow results

## 2019-03-16 NOTE — Progress Notes (Signed)
Patient reports chest pain. o2 sats in 80's on 2 liters. rr 28 skin, cool, clammy, and sweaty. Placed patient on 40% venturi mask even though his sats increased to 95 on 5 liter nasal cannula. Patient with panting respirations. Will continue to monitor.

## 2019-03-16 NOTE — Consult Note (Signed)
ANTICOAGULATION CONSULT NOTE - Initial Consult  Pharmacy Consult for Heparin infusion  Indication: chest pain/ACS  No Known Allergies  Patient Measurements: Height: 5\' 7"  (170.2 cm) Weight: 236 lb 4.8 oz (107.2 kg) IBW/kg (Calculated) : 66.1 Heparin Dosing Weight: 89 kg  Vital Signs: Temp: 98.9 F (37.2 C) (06/08 0755) Temp Source: Axillary (06/08 0755) BP: 108/58 (06/08 0755) Pulse Rate: 104 (06/08 0755)  Labs: Recent Labs    03/14/19 0430  03/14/19 1200  03/14/19 1602 03/15/19 0434 03/16/19 0341 03/16/19 0414 03/16/19 0833 03/16/19 1107  HGB 7.0*  --   --   --   --  7.1* 7.9*  --   --   --   HCT 20.2*  --   --   --   --  21.2* 23.3*  --   --   --   PLT 154  --   --   --   --  200 327  --   --   --   APTT  --   --   --   --   --   --   --  26  --   --   LABPROT  --   --   --   --   --   --   --  14.0  --   --   INR  --   --   --   --   --   --   --  1.1  --   --   HEPARINUNFRC 0.29*  --  0.29*  --   --   --   --   --   --  <0.10*  CREATININE 3.05*   < >  --    < > 3.02* 4.07* 6.08*  --   --   --   TROPONINI 0.25*  --   --   --   --   --  0.31*  --  0.26* 0.22*   < > = values in this interval not displayed.    Estimated Creatinine Clearance: 20.7 mL/min (A) (by C-G formula based on SCr of 6.08 mg/dL (H)).   Medical History: Past Medical History:  Diagnosis Date  . Hypertension   . IBS (irritable bowel syndrome)     Assessment: Pharmacy consulted for heparin infusion dosing and monitoring for ACS/STEMI/CP in 28 yo male. Pt was previously, on heparin during this admission.   Troponin 0.31 >> 0.26 >> 0.22  Goal of Therapy:  Heparin level 0.3-0.7 units/ml Monitor platelets by anticoagulation protocol: Yes   6/8: HL @ 1252 <0.10. Subtherapeutic. s/p 4000 unit bolus x1 dose and infusion rate of 1050 units/hr.RN confirmed that  the heparin gtt was stopped/interrupted. Notably was obtained at 11:07 but results were not available until 12:52. Will bolus with 30  units/kg and increase rate by 3 units/kg/hr.   Plan:  Heparin DW: 89 kg Baseline labs ordered. Hgb: 7.9 Give 2650 units bolus x 1 Increase heparin infusion 1300 units/hr Check anti-Xa level in 6 hours @ 2000 Continue to monitor H&H and platelets- CBC ordered for tomorrow.   Rowland Lathe, PharmD Clinical Pharmacist 03/16/2019 12:57 PM

## 2019-03-16 NOTE — Progress Notes (Signed)
Pt c/o chest pain and sitter reports respirations becoming rapid. Pt diaphoretic, O2 level 88% on RA. RT assessed and put on venturi mask. NP Seals made aware. STAT chest xray, labs, and EKG ordered. Pt given SL nitro x2 with no relief, BP decreased to 90/43. NP ordered 2 mg morphine IV, 2x runs K+, and pharmacy consult for anticoag. Pt currently on heparin drip at 10.5 ml/h. No c/o pain or SOB at this time. Pt is resting with no concerns offered.

## 2019-03-16 NOTE — Progress Notes (Signed)
HD Tx Start    03/16/19 1350  Vital Signs  Pulse Rate (!) 103  Resp (!) 40  BP (!) 106/55  Oxygen Therapy  SpO2 95 %  O2 Device HFNC  O2 Flow Rate (L/min) 10 L/min  Pain Assessment  Pain Scale 0-10  Pain Score 0  Dialysis Weight  Weight 108 kg  Type of Weight Pre-Dialysis  Time-Out for Hemodialysis  What Procedure? Hemodialysis  Pt Identifiers(min of two) First/Last Name;MRN/Account#  Correct Site? Yes  Correct Side? Yes  Correct Procedure? Yes  Consents Verified? Yes  Rad Studies Available? N/A  Safety Precautions Reviewed? Yes  Engineer, civil (consulting) Number 4  Station Number 4  UF/Alarm Test Passed  Conductivity: Meter 14  Conductivity: Machine  14  pH 7.2  Reverse Osmosis Main  Normal Saline Lot Number [id number]  Dialyzer Lot Number [id number]  Disposable Set Lot Number [id number]  Machine Temperature 98.6 F (37 C)  Musician and Audible Yes  Blood Lines Intact and Secured Yes  Pre Treatment Patient Checks  Vascular access used during treatment Catheter  Date Hepatitis B Surface Antigen Drawn 03/16/19 (UNKNOWN result)  Hepatitis B Surface Antibody  (UNK)  Date Hepatitis B Surface Antibody Drawn 03/16/19  Hemodialysis Consent Verified Yes  Hemodialysis Standing Orders Initiated Yes  ECG (Telemetry) Monitor On Yes  Prime Ordered Normal Saline  Length of  DialysisTreatment -hour(s) 3.5 Hour(s)  Dialysis Treatment Comments Na 140  Dialyzer Elisio 17H NR  Dialysate 3K, 2.5 Ca  Dialysate Flow Ordered 800  Blood Flow Rate Ordered 400 mL/min  Ultrafiltration Goal 2 Liters  Pre Treatment Labs Hepatitis B Surface Antigen;Phosphorus (PTH)  Dialysis Blood Pressure Support Ordered Normal Saline  Education / Care Plan  Dialysis Education Provided Yes  Documented Education in Care Plan Yes  Hemodialysis Catheter Left Internal jugular Double-lumen  Placement Date/Time: 03/09/19 2037   Placed prior to admission: No  Time Out: Correct patient;Correct site;Correct  procedure  Maximum sterile barrier precautions: Sterile gown;Mask;Cap;Large sterile sheet;Sterile gloves;Hand hygiene  Site Prep: Chlorh...  Site Condition No complications  Blue Lumen Status Infusing  Red Lumen Status Infusing  Dressing Type Biopatch;Occlusive  Interventions Dressing changed;New dressing  Drainage Description None

## 2019-03-16 NOTE — Progress Notes (Signed)
Chart reviewed, Pt visited. Currently on a soft diet but taking in minimally. Chart revealed episodes of shortness of breath. Pt reports toleration of consistency with no coughing or choking. No Po's given secondary to tests scheduled for later today. St to follow up with diet to see if down grade is needed in hopes of increased intake with less effort. Tolerating meds per Nsg.

## 2019-03-16 NOTE — Progress Notes (Signed)
Attempted by 2 to see patient today for psychiatric evaluation.  Patient has been at hemodialysis.  Lavella Hammock, MD

## 2019-03-16 NOTE — Progress Notes (Addendum)
[place] [facility] [place] NAME: Glenn Rasmussen    MR#:  [id number]  DATE OF BIRTH:  1991/07/04  SUBJECTIVE:   -Patient transferred out of ICU.  Having chest tightness and difficulty breathing since 4 am. recieved 20 mg IV lasix at 6 am Not much UOP Currently on ventimask with 40% Fio2--sats 100%  REVIEW OF SYSTEMS:  Review of Systems  Constitutional: Negative for chills, fever and weight loss.  HENT: Negative for ear discharge, ear pain and nosebleeds.   Eyes: Negative for blurred vision, pain and discharge.  Respiratory: Positive for shortness of breath. Negative for sputum production, wheezing and stridor.   Cardiovascular: Positive for chest pain. Negative for palpitations, orthopnea and PND.  Gastrointestinal: Negative for abdominal pain, diarrhea, nausea and vomiting.  Genitourinary: Negative for frequency and urgency.  Musculoskeletal: Negative for back pain and joint pain.  Neurological: Positive for weakness. Negative for sensory change, speech change and focal weakness.  Psychiatric/Behavioral: Negative for depression and hallucinations. The patient is nervous/anxious.     DRUG ALLERGIES:  No Known Allergies  VITALS:  Blood pressure (!) 108/58, pulse (!) 104, temperature 98.9 F (37.2 C), temperature source Axillary, resp. rate (!) 28, height 5\' 7"  (1.702 m), weight 107.2 kg, SpO2 100 %.  PHYSICAL EXAMINATION:  Physical Exam   GENERAL:  28 y.o.-year-old critically ill-appearing patient lying in the bed with mild acute distress. obese EYES: Pupils equal, round, reactive to light and accommodation. No scleral icterus. Extraocular muscles intact.  HEENT: Head atraumatic, normocephalic. Oropharynx and nasopharynx clear.  NECK:  Supple, no jugular venous distention. No thyroid enlargement, no tenderness.   LUNGS: distant breath sounds bilaterally, no wheezing, ++rales,norhonchi or crepitation. Mild use of accessory muscles of  respiration.  Decreased bibasilar breath sounds CARDIOVASCULAR: S1, S2 normal. No murmurs, rubs, or gallops.  ABDOMEN: Soft, nontender, nondistended. Bowel sounds present. No organomegaly or mass.  EXTREMITIES: No  cyanosis, or clubbing.  1+ pedal edema noted NEUROLOGIC: Patient he is alert but remains oriented x2.   PSYCHIATRIC: The patient is alert and oriented x2 anxious SKIN: No obvious rash, lesion, or ulcer.    LABORATORY PANEL:   CBC Recent Labs  Lab 03/16/19 0341  WBC 21.8*  HGB 7.9*  HCT 23.3*  PLT 327   ------------------------------------------------------------------------------------------------------------------  Chemistries  Recent Labs  Lab 03/16/19 0341  NA 134*  K 3.4*  CL 99  CO2 18*  GLUCOSE 111*  BUN 74*  CREATININE 6.08*  CALCIUM 8.5*  MG 2.5*  AST 59*  ALT 43  ALKPHOS 52  BILITOT 1.1   ------------------------------------------------------------------------------------------------------------------  Cardiac Enzymes Recent Labs  Lab 03/16/19 0341  TROPONINI 0.31*   ------------------------------------------------------------------------------------------------------------------  RADIOLOGY:  Dg Abd 1 View  Result Date: 03/14/2019 CLINICAL DATA:  28 year old male with history of abdominal pain. EXAM: ABDOMEN - 1 VIEW COMPARISON:  03/09/2019. FINDINGS: Right femoral central venous catheter with tip projecting over the expected location of the proximal right common femoral vein. Gas and stool are seen scattered throughout the colon extending to the level of the distal rectum. No pathologic distension of small bowel is noted. Several nondilated gas-filled loops of small bowel or noted projecting over the central abdomen. No gross evidence of pneumoperitoneum. IMPRESSION: 1. Nonspecific, nonobstructive bowel gas pattern, as above. 2. No pneumoperitoneum. Electronically Signed   By: Vinnie Langton M.D.   On: 03/14/2019 22:34   Dg Chest Port 1  View  Result Date: 03/16/2019 CLINICAL DATA:  Chest pain. EXAM:  PORTABLE CHEST 1 VIEW COMPARISON:  One-view chest x-ray 03/13/2019 FINDINGS: A left IJ line is stable. Heart size is exaggerated by low lung volumes. Patchy bilateral interstitial and airspace opacities have increased. Airspace disease is asymmetric on the left. No definite effusions are present. Visualized soft tissues and bony thorax are unremarkable. IMPRESSION: 1. Increasing interstitial and airspace opacities, left greater than right. Findings are compatible with ARDS or infection. 2. Stable left IJ line. Electronically Signed   By: San Morelle M.D.   On: 03/16/2019 04:57    EKG:   Orders placed or performed during the hospital encounter of 03/08/19  . ED EKG  . ED EKG  . ED EKG  . ED EKG  . EKG 12-Lead  . EKG 12-Lead  . EKG 12-Lead  . EKG 12-Lead  . EKG 12-Lead  . EKG 12-Lead  . EKG 12-Lead  . EKG 12-Lead  . EKG 12-Lead  . EKG 12-Lead  . EKG 12-Lead  . EKG 12-Lead  . EKG 12-Lead  . EKG 12-Lead  . EKG - 12 lead  . EKG - 12 lead  . EKG 12-Lead  . EKG 12-Lead    ASSESSMENT AND PLAN:   28 year old male with past medical history significant for hypertension, IBS admitted secondary to overdose on Phenergan, losartan, Norvasc and Klonopin  1.  Acute Hypoxic respiratory failure due to volume overload/?ARDS -Patient is extubated on 03/12/2019 -currently on ventimask -sats 93-94% -ABG stat--shows resp alkalosis--?hyperventilating -EKG--sinus tachycardia -IV lasix x 40 mg now -spoke with Dr Holley Raring-- see if pt can get HD today -FDP elevated--started on IV heparin gtt. -v/Q scan when able to  2.Intentional drug overdose -patient overdosed on 38 tablets of Phenergan, 19 tablets of losartan, 42 tablets of Norvasc and 78 tablets of Klonopin -Urine tox positive for benzos and tricyclics -Appreciate psych consult.  Once patient is medically stable, will need inpatient psychiatry admission.  3.  Acute renal  failure-likely ATN from hypotension.  -Started on CRRT, metabolic acidosis.  Appreciate nephrology consult - CRRTon hold for now -appears volume overload vs ARDS/lung damage  4.   Pneumonia-follow-up procalcitonin and WBC. - Currently on Rocephin -wbc 21K  5.  DVT prophylaxis- on heparin gtt  6.  GERD-Protonix  7.  Elevated troponin-likely demand ischemia, troponin plateaued vs due to renal failure -  Most recent echo this admission showing normal LV function and no wall motion abnormalities.  8.Anemia appears due to current illness with renal failure No active bleed   D/w dr Holley Raring  CODE STATUS: Full code  TOTAL criticalTIME TAKING CARE OF THIS PATIENT: 35 minutes.   POSSIBLE D/C IN ? DAYS, DEPENDING ON CLINICAL CONDITION.   Fritzi Mandes M.D on 03/16/2019 at 8:18 AM  Between 7am to 6pm - Pager - [phone]  After 6pm go to [email or web address] - password [email or web address]  Office  [phone]  CC: Primary care physician; Maeola Sarah, MD

## 2019-03-16 NOTE — Progress Notes (Signed)
Pre HD Assessment    03/16/19 1340  Neurological  Level of Consciousness Alert  Orientation Level Oriented X4  Respiratory  Respiratory Pattern Tachypnea  Chest Assessment Chest expansion symmetrical  Bilateral Breath Sounds Diminished  Cardiac  Pulse Regular  Heart Sounds S1, S2;No adventitious heart sounds  ECG Monitor Yes  Cardiac Rhythm ST  Ectopy Multifocal PVC's  Ectopy Frequency Occasional  Vascular  R Radial Pulse +2  L Radial Pulse +2  Generalized Edema +2  Integumentary  Integumentary (WDL) X  Skin Color Pale  Skin Condition Dry  Musculoskeletal  Musculoskeletal (WDL) X  Generalized Weakness Yes  Gastrointestinal  Bowel Sounds Assessment Active  GU Assessment  Genitourinary (WDL) X  Genitourinary Symptoms  (HD pt first tx)  Psychosocial  Psychosocial (WDL) X  Patient Behaviors Anxious  Needs Expressed Denies  Emotional support given Given to patient

## 2019-03-16 NOTE — TOC Progression Note (Signed)
Transition of Care [facility]) - Progression Note    Patient Details  Name: Glenn Rasmussen MRN: [id number] Date of Birth: 03-Feb-1991  Transition [facility] Care [facility] CM/SW Contact  Elza Rafter, RN Phone Number: 03/16/2019, 10:12 AM  Clinical Narrative:   Tiajuana Amass to DC-Patient remains on venturi mask with O2 sats at 100%.  Possible HD today for volume overload.  WBC 21-currently on Rocephin.     Expected Discharge Plan: [place] Barriers to Discharge: Continued Medical Work up  Expected Discharge Plan and Services Expected Discharge Plan: [place]   Discharge Planning Services: CM Consult   Living arrangements for the past 2 months: Apartment                                       Social Determinants of Health (SDOH) Interventions    Readmission Risk Interventions No flowsheet data found.

## 2019-03-17 ENCOUNTER — Inpatient Hospital Stay: Payer: BC Managed Care – PPO

## 2019-03-17 DIAGNOSIS — K567 Ileus, unspecified: Secondary | ICD-10-CM

## 2019-03-17 LAB — CULTURE, BLOOD (ROUTINE X 2): Culture: NO GROWTH

## 2019-03-17 LAB — HEPATIC FUNCTION PANEL
ALT: 50 U/L — ABNORMAL HIGH (ref 0–44)
AST: 58 U/L — ABNORMAL HIGH (ref 15–41)
Albumin: 3.8 g/dL (ref 3.5–5.0)
Alkaline Phosphatase: 53 U/L (ref 38–126)
Bilirubin, Direct: 0.2 mg/dL (ref 0.0–0.2)
Indirect Bilirubin: 0.6 mg/dL (ref 0.3–0.9)
Total Bilirubin: 0.8 mg/dL (ref 0.3–1.2)
Total Protein: 7 g/dL (ref 6.5–8.1)

## 2019-03-17 LAB — BASIC METABOLIC PANEL
Anion gap: 19 — ABNORMAL HIGH (ref 5–15)
BUN: 76 mg/dL — ABNORMAL HIGH (ref 6–20)
CO2: 19 mmol/L — ABNORMAL LOW (ref 22–32)
Calcium: 8.5 mg/dL — ABNORMAL LOW (ref 8.9–10.3)
Chloride: 99 mmol/L (ref 98–111)
Creatinine, Ser: 5.96 mg/dL — ABNORMAL HIGH (ref 0.61–1.24)
GFR calc Af Amer: 13 mL/min — ABNORMAL LOW (ref >60)
GFR calc non Af Amer: 12 mL/min — ABNORMAL LOW (ref >60)
Glucose, Bld: 132 mg/dL — ABNORMAL HIGH (ref 70–99)
Potassium: 4.2 mmol/L (ref 3.5–5.1)
Sodium: 137 mmol/L (ref 135–145)

## 2019-03-17 LAB — CBC
HCT: 23.7 % — ABNORMAL LOW (ref 39.0–52.0)
Hemoglobin: 7.9 g/dL — ABNORMAL LOW (ref 13.0–17.0)
MCH: 26.9 pg (ref 26.0–34.0)
MCHC: 33.3 g/dL (ref 30.0–36.0)
MCV: 80.6 fL (ref 80.0–100.0)
Platelets: 364 10*3/uL (ref 150–400)
RBC: 2.94 MIL/uL — ABNORMAL LOW (ref 4.22–5.81)
RDW: 13.8 % (ref 11.5–15.5)
WBC: 18.1 10*3/uL — ABNORMAL HIGH (ref 4.0–10.5)
nRBC: 0.2 % (ref 0.0–0.2)

## 2019-03-17 LAB — BLOOD GAS, ARTERIAL
Acid-base deficit: 1.3 mmol/L (ref 0.0–2.0)
Acid-base deficit: 1.9 mmol/L (ref 0.0–2.0)
Bicarbonate: 18.8 mmol/L — ABNORMAL LOW (ref 20.0–28.0)
Bicarbonate: 18.4 mmol/L — ABNORMAL LOW (ref 20.0–28.0)
FIO2: 1
FIO2: 0.8
O2 Saturation: 92.1 %
O2 Saturation: 94.4 %
Patient temperature: 37
Patient temperature: 37
pCO2 arterial: 21 mmHg — ABNORMAL LOW (ref 32.0–48.0)
pCO2 arterial: 21 mmHg — ABNORMAL LOW (ref 32.0–48.0)
pH, Arterial: 7.56 — ABNORMAL HIGH (ref 7.350–7.450)
pH, Arterial: 7.55 — ABNORMAL HIGH (ref 7.350–7.450)
pO2, Arterial: 54 mmHg — ABNORMAL LOW (ref 83.0–108.0)
pO2, Arterial: 62 mmHg — ABNORMAL LOW (ref 83.0–108.0)

## 2019-03-17 LAB — MAGNESIUM: Magnesium: 2.6 mg/dL — ABNORMAL HIGH (ref 1.7–2.4)

## 2019-03-17 LAB — TSH: TSH: 2.108 u[IU]/mL (ref 0.350–4.500)

## 2019-03-17 LAB — PHOSPHORUS: Phosphorus: 5.3 mg/dL — ABNORMAL HIGH (ref 2.5–4.6)

## 2019-03-17 LAB — GLUCOSE, CAPILLARY: Glucose-Capillary: 144 mg/dL — ABNORMAL HIGH (ref 70–99)

## 2019-03-17 MED ORDER — ONDANSETRON HCL 4 MG/2ML IJ SOLN
INTRAMUSCULAR | Status: AC
  Administered 2019-03-17: 10:00:00 4 mg via INTRAVENOUS
  Filled 2019-03-17: qty 4

## 2019-03-17 MED ORDER — CHLORHEXIDINE GLUCONATE 0.12 % MT SOLN
15.0000 mL | Freq: Two times a day (BID) | OROMUCOSAL | Status: DC
  Administered 2019-03-17 (×2): 15 mL via OROMUCOSAL
  Filled 2019-03-17: qty 15

## 2019-03-17 MED ORDER — ORAL CARE MOUTH RINSE
15.0000 mL | Freq: Two times a day (BID) | OROMUCOSAL | Status: DC
  Administered 2019-03-17: 16:00:00 15 mL via OROMUCOSAL

## 2019-03-17 MED ORDER — ONDANSETRON HCL 4 MG/2ML IJ SOLN
4.0000 mg | Freq: Once | INTRAMUSCULAR | Status: AC
  Administered 2019-03-17: 4 mg via INTRAVENOUS

## 2019-03-17 MED ORDER — METRONIDAZOLE IN NACL 5-0.79 MG/ML-% IV SOLN
500.0000 mg | Freq: Three times a day (TID) | INTRAVENOUS | Status: DC
  Administered 2019-03-17: 22:00:00 500 mg via INTRAVENOUS
  Filled 2019-03-17 (×3): qty 100

## 2019-03-17 MED ORDER — HALOPERIDOL LACTATE 5 MG/ML IJ SOLN
2.0000 mg | Freq: Four times a day (QID) | INTRAMUSCULAR | Status: DC | PRN
  Administered 2019-03-17 – 2019-03-23 (×3): 2 mg via INTRAMUSCULAR
  Filled 2019-03-17 (×3): qty 1

## 2019-03-17 MED ORDER — HEPARIN SODIUM (PORCINE) 5000 UNIT/ML IJ SOLN
5000.0000 [IU] | Freq: Three times a day (TID) | INTRAMUSCULAR | Status: DC
  Administered 2019-03-17 – 2019-03-30 (×36): 5000 [IU] via SUBCUTANEOUS
  Filled 2019-03-17 (×38): qty 1

## 2019-03-17 MED ORDER — SODIUM CHLORIDE 0.9 % IV SOLN
8.0000 mg | Freq: Once | INTRAVENOUS | Status: DC
  Filled 2019-03-17: qty 4

## 2019-03-17 MED ORDER — LORAZEPAM 2 MG/ML IJ SOLN
1.0000 mg | Freq: Once | INTRAMUSCULAR | Status: AC
  Administered 2019-03-17: 1 mg via INTRAVENOUS
  Filled 2019-03-17: qty 1

## 2019-03-17 MED ORDER — METOCLOPRAMIDE HCL 5 MG/ML IJ SOLN
5.0000 mg | Freq: Four times a day (QID) | INTRAMUSCULAR | Status: DC
  Administered 2019-03-17 – 2019-03-19 (×8): 5 mg via INTRAVENOUS
  Filled 2019-03-17 (×8): qty 2

## 2019-03-17 MED ORDER — OXYCODONE-ACETAMINOPHEN 5-325 MG PO TABS
1.0000 | ORAL_TABLET | ORAL | Status: DC | PRN
  Administered 2019-03-17: 1 via ORAL
  Filled 2019-03-17: qty 1

## 2019-03-17 MED ORDER — LORAZEPAM 2 MG/ML IJ SOLN
2.0000 mg | Freq: Once | INTRAMUSCULAR | Status: AC
  Administered 2019-03-17: 23:00:00 2 mg via INTRAVENOUS
  Filled 2019-03-17: qty 1

## 2019-03-17 NOTE — Progress Notes (Signed)
[facility], [place] 03/17/19  Subjective:  Patient seen at bedside in the critical care unit. Currently on high flow oxygen. Still has diminished renal function. Not very much urine output noted this a.m.    Objective:  Vital signs in last 24 hours:  Temp:  [98.4 F (36.9 C)-100.1 F (37.8 C)] 98.9 F (37.2 C) (06/09 1200) Pulse Rate:  [93-115] 96 (06/09 1300) Resp:  [19-56] 33 (06/09 1300) BP: (104-135)/(47-64) 108/57 (06/09 1300) SpO2:  [88 %-100 %] 93 % (06/09 1300) FiO2 (%):  [95 %] 95 % (06/09 1200) Weight:  [105.5 kg-106.6 kg] 106.6 kg (06/09 0314)  Weight change: 0.815 kg Filed Weights   03/16/19 1350 03/16/19 1715 03/17/19 0314  Weight: 108 kg 105.5 kg 106.6 kg    Intake/Output:    Intake/Output Summary (Last 24 hours) at 03/17/2019 1403 Last data filed at 03/17/2019 1200 Gross per 24 hour  Intake 2.97 ml  Output 5975 ml  Net -5972.03 ml   General :        No acute distress Head:              Normocephalic, atraumatic Eyes/ENT:      Moist oral mucus membranes Neck:              Supple Lungs:            Scattered rhonchi, on HFNC Heart:             S1S2 no rubs Abdomen:      Soft, non-tender Extremities:   no cyanosis, + dependent edema Skin:               Skin color, texture, turgor normal, no rashes or lesions Neurologic:    alert, able to follow commands IJ vascath  Basic Metabolic Panel:  Recent Labs  Lab 03/13/19 0441  03/14/19 0430 03/14/19 0803 03/14/19 1225 03/14/19 1602 03/15/19 0434 03/16/19 0341 03/16/19 1438 03/17/19 0536  NA 136   < > 138 138 137 137 138 134*  --  137  K 3.8   < > 3.2* 3.5 3.1* 3.1* 3.2* 3.4*  --  4.2  CL 104   < > 104 104 102 103 104 99  --  99  CO2 21*   < > 24 21* 23 24 23  18*  --  19*  GLUCOSE 159*   < > 100* 118* 118* 108* 109* 111*  --  132*  BUN 43*   < > 41* 39* 38* 37* 48* 74*  --  76*  CREATININE 3.63*   < > 3.05* 3.01* 3.03* 3.02* 4.07* 6.08*  --  5.96*  CALCIUM 8.0*   < >  8.2* 8.5* 8.5* 8.6* 8.3* 8.5*  --  8.5*  MG 2.3  --  2.2  --   --   --  2.3 2.5*  --  2.6*  PHOS 4.2   < >  --  2.7 2.4* 2.3*  --   --  5.4* 5.3*   < > = values in this interval not displayed.     CBC: Recent Labs  Lab 03/13/19 0441 03/14/19 0430 03/15/19 0434 03/16/19 0341 03/17/19 0536  WBC 17.1* 11.1* 13.7* 21.8* 18.1*  NEUTROABS  --   --   --  16.2*  --   HGB 8.1* 7.0* 7.1* 7.9* 7.9*  HCT 23.3* 20.2* 21.2* 23.3* 23.7*  MCV 78.5* 78.3* 80.0 80.6 80.6  PLT 140* 154 200 327 364     No  results found for: HEPBSAG, HEPBSAB, HEPBIGM    Microbiology:  Recent Results (from the past 240 hour(s))  SARS Coronavirus 2 (CEPHEID - Performed in [place] hospital lab), Hosp Order     Status: None   Collection Time: 03/08/19 11:48 PM  Result Value Ref Range Status   SARS Coronavirus 2 NEGATIVE NEGATIVE Final    Comment: (NOTE) If result is NEGATIVE SARS-CoV-2 target nucleic acids are NOT DETECTED. The SARS-CoV-2 RNA is generally detectable in upper and lower  respiratory specimens during the acute phase of infection. The lowest  concentration of SARS-CoV-2 viral copies this assay can detect is 250  copies / mL. A negative result does not preclude SARS-CoV-2 infection  and should not be used as the sole basis for treatment or other  patient management decisions.  A negative result may occur with  improper specimen collection / handling, submission of specimen other  than nasopharyngeal swab, presence of viral mutation(s) within the  areas targeted by this assay, and inadequate number of viral copies  (<250 copies / mL). A negative result must be combined with clinical  observations, patient history, and epidemiological information. If result is POSITIVE SARS-CoV-2 target nucleic acids are DETECTED. The SARS-CoV-2 RNA is generally detectable in upper and lower  respiratory specimens dur ing the acute phase of infection.  Positive  results are indicative of active infection  with SARS-CoV-2.  Clinical  correlation with patient history and other diagnostic information is  necessary to determine patient infection status.  Positive results do  not rule out bacterial infection or co-infection with other viruses. If result is PRESUMPTIVE POSTIVE SARS-CoV-2 nucleic acids MAY BE PRESENT.   A presumptive positive result was obtained on the submitted specimen  and confirmed on repeat testing.  While 2019 novel coronavirus  (SARS-CoV-2) nucleic acids may be present in the submitted sample  additional confirmatory testing may be necessary for epidemiological  and / or clinical management purposes  to differentiate between  SARS-CoV-2 and other Sarbecovirus currently known to infect humans.  If clinically indicated additional testing with an alternate test  methodology [phone]) is advised. The SARS-CoV-2 RNA is generally  detectable in upper and lower respiratory sp ecimens during the acute  phase of infection. The expected result is Negative. Fact Sheet for Patients:  StrictlyIdeas.no Fact Sheet for Healthcare Providers: BankingDealers.co.za This test is not yet approved or cleared by the [place] FDA and has been authorized for detection and/or diagnosis of SARS-CoV-2 by FDA under an Emergency Use Authorization (EUA).  This EUA will remain in effect (meaning this test can be used) for the duration of the COVID-19 declaration under Section 564(b)(1) of the Act, 21 U.S.C. section 360bbb-3(b)(1), unless the authorization is terminated or revoked sooner. Performed at [facility], [address] [place], [place] [address]   MRSA PCR Screening     Status: None   Collection Time: 03/09/19  2:48 AM  Result Value Ref Range Status   MRSA by PCR NEGATIVE NEGATIVE Final    Comment:        The GeneXpert MRSA Assay (FDA approved for NASAL specimens only), is one component of a comprehensive MRSA  colonization surveillance program. It is not intended to diagnose MRSA infection nor to guide or monitor treatment for MRSA infections. Performed at [facility], [address] [place], [place] [address]   Culture, respiratory (non-expectorated)     Status: None   Collection Time: 03/09/19  8:00 AM  Result Value Ref Range  Status   Specimen Description   Final    TRACHEAL ASPIRATE Performed at [facility], [address] [place], [place] [address]    Special Requests   Final    NONE Performed at [facility] [place] [facility], [address] [place], [place] [address]    Gram Stain   Final    ABUNDANT WBC PRESENT,BOTH PMN AND MONONUCLEAR MODERATE GRAM POSITIVE COCCI RARE YEAST    Culture   Final    MODERATE GROUP B STREP(S.AGALACTIAE)ISOLATED TESTING AGAINST S. AGALACTIAE NOT ROUTINELY PERFORMED DUE TO PREDICTABILITY OF AMP/PEN/VAN SUSCEPTIBILITY. Performed at [facility], [place] [address], [place], [place] [address]    Report Status 03/11/2019 FINAL  Final  CULTURE, BLOOD (ROUTINE X 2) w Reflex to ID Panel     Status: None (Preliminary result)   Collection Time: 03/09/19  2:53 PM  Result Value Ref Range Status   Specimen Description   Final    BLOOD BLOOD RIGHT ARM Performed at [facility], [address], [place], [place] [address]    Special Requests   Final    BOTTLES DRAWN AEROBIC AND ANAEROBIC Blood Culture results may not be optimal due to an excessive volume of blood received in culture bottles Performed at [facility], [address], [place], [place] [address]    Culture  Setup Time PENDING  Incomplete   Culture   Final    NO GROWTH 5 DAYS Performed at [facility], [place] [address], [place], [place] [address]    Report Status PENDING  Incomplete  CULTURE, BLOOD (ROUTINE X 2) w Reflex to ID Panel     Status: None   Collection Time: 03/09/19  5:00 PM  Result Value Ref Range Status   Specimen Description   Final     BLOOD RIGHT ARM Performed at [facility], [address], [place], [place] [address]    Special Requests   Final    BOTTLES DRAWN AEROBIC AND ANAEROBIC Blood Culture results may not be optimal due to an excessive volume of blood received in culture bottles Performed at [facility], [address], [place], [place] [address]    Culture   Final    NO GROWTH 5 DAYS Performed at [facility], [place] [address] [place], [place] [address]    Report Status 03/15/2019 FINAL  Final  Urine Culture     Status: None   Collection Time: 03/11/19  4:33 AM  Result Value Ref Range Status   Specimen Description   Final    URINE, RANDOM Performed at [facility], [address] [place], [place] [address]    Special Requests   Final    NONE Performed at [facility], [address] [place] [address] [place], [place] [address]    Culture   Final    NO GROWTH Performed at [facility], [place] [address], [place],  [address]    Report Status 03/12/2019 FINAL  Final    Coagulation Studies: Recent Labs    03/16/19 0414  LABPROT 14.0  INR 1.1    Urinalysis: No results for input(s): COLORURINE, LABSPEC, PHURINE, GLUCOSEU, HGBUR, BILIRUBINUR, KETONESUR, PROTEINUR, UROBILINOGEN, NITRITE, LEUKOCYTESUR in the last 72 hours.  Invalid input(s): APPERANCEUR    Imaging: Ct Abdomen Pelvis Wo Contrast  Result Date: 03/17/2019 CLINICAL DATA:  Overdose, abdominal distension EXAM: CT ABDOMEN AND PELVIS WITHOUT CONTRAST TECHNIQUE: Multidetector CT imaging of the abdomen and pelvis was performed following the standard  protocol without IV contrast. COMPARISON:  04/27/2007 FINDINGS: Lower chest: Small bilateral pleural effusions and associated atelectasis or consolidation. Hepatobiliary: No focal liver abnormality is seen. Status post cholecystectomy. No biliary dilatation. Pancreas: Unremarkable. No pancreatic ductal dilatation or surrounding inflammatory changes.  Spleen: Normal in size without significant abnormality. Adrenals/Urinary Tract: Adrenal glands are unremarkable. Kidneys are normal, without renal calculi, solid lesion, or hydronephrosis. Foley catheter in the bladder Stomach/Bowel: Appendix is surgically absent. The stomach, small bowel, and colon are diffusely distended by air and fluid, with fluid present to the rectum. Vascular/Lymphatic: Right femoral central venous catheter. No enlarged abdominal or pelvic lymph nodes. Reproductive: No mass or other significant abnormality. Other: No abdominal wall hernia or abnormality. Small volume ascites. Anasarca. Musculoskeletal: No acute or significant osseous findings. IMPRESSION: 1. The stomach, small bowel, and colon are diffusely distended by air and fluid, with fluid present to the rectum. Findings are consistent with ileus. 2.  Pleural effusions, ascites, and anasarca. 3.  Status post cholecystectomy and appendectomy. Electronically Signed   By: Eddie Candle M.D.   On: 03/17/2019 08:35   Dg Abd 1 View  Result Date: 03/17/2019 CLINICAL DATA:  Check gastric catheter placement EXAM: ABDOMEN - 1 VIEW COMPARISON:  None. FINDINGS: Gastric catheter is noted coiled within the stomach. Scattered large and small bowel gas is seen similar to that seen on prior CT examination. No free air is noted. IMPRESSION: Gastric catheter coiled within the stomach. Electronically Signed   By: Inez Catalina M.D.   On: 03/17/2019 10:09   Nm Pulmonary Perf And Vent  Result Date: 03/16/2019 CLINICAL DATA:  Shortness of breath EXAM: NUCLEAR MEDICINE VENTILATION - PERFUSION LUNG SCAN VIEWS: Anterior and posterior: Ventilation and perfusion. Patient was unable to tolerate additional imaging. RADIOPHARMACEUTICALS:  32.1 mCi of Tc-27m DTPA aerosol inhalation and 4.38 mCi Tc55m MAA IV COMPARISON:  Chest radiograph March 15, 2017 FINDINGS: Ventilation: Radiotracer uptake is homogeneous and symmetric bilaterally. No evident ventilation defects.  Perfusion: Radiotracer uptake is homogeneous and symmetric bilaterally. No perfusion defects evident. IMPRESSION: No ventilation or perfusion defects. Very low probability of pulmonary embolus. Electronically Signed   By: Lowella Grip III M.D.   On: 03/16/2019 13:22   Dg Chest Port 1 View  Result Date: 03/17/2019 CLINICAL DATA:  Respiratory failure, distress EXAM: PORTABLE CHEST 1 VIEW COMPARISON:  03/16/2019 FINDINGS: Left central line is unchanged. NG tube placement into the stomach. Bilateral interstitial and airspace opacities are similar prior study. Mild cardiomegaly. Small bilateral effusions. No acute bony abnormality. IMPRESSION: Diffuse interstitial and airspace opacities are similar prior study. Small effusions. Electronically Signed   By: Rolm Baptise M.D.   On: 03/17/2019 10:08   Dg Chest Port 1 View  Result Date: 03/16/2019 CLINICAL DATA:  Chest pain. EXAM: PORTABLE CHEST 1 VIEW COMPARISON:  One-view chest x-ray 03/13/2019 FINDINGS: A left IJ line is stable. Heart size is exaggerated by low lung volumes. Patchy bilateral interstitial and airspace opacities have increased. Airspace disease is asymmetric on the left. No definite effusions are present. Visualized soft tissues and bony thorax are unremarkable. IMPRESSION: 1. Increasing interstitial and airspace opacities, left greater than right. Findings are compatible with ARDS or infection. 2. Stable left IJ line. Electronically Signed   By: San Morelle M.D.   On: 03/16/2019 04:57     Medications:   . sodium chloride Stopped (03/16/19 1907)  . albumin human 12.5 g (03/17/19 1347)  . cefTRIAXone (ROCEPHIN)  IV Stopped (03/16/19 1843)   . aspirin EC  325 mg Oral Daily  . chlorhexidine  15 mL Mouth Rinse BID  . Chlorhexidine Gluconate Cloth  6 each Topical Q0600  . furosemide  40 mg Intravenous Once  . heparin injection (subcutaneous)  5,000 Units Subcutaneous Q8H  . mouth rinse  15 mL Mouth Rinse q12n4p  .  metoCLOPramide (REGLAN) injection  5 mg Intravenous Q6H   sodium chloride, bisacodyl, haloperidol lactate, ipratropium-albuterol, morphine injection, nitroGLYCERIN, oxyCODONE-acetaminophen, senna, sodium chloride flush, traZODone  Assessment/ Plan:  28 y.o.caucasian male with HTN , was admitted on 03/08/2019 with ARF, acute resp failure secondary to drug overdose  1.  Oliguric acute renal failure with hyperkalemia (resolved) and volume overload  likely severe ATN CRRT started 6/1-> 6/6.  HD 6/8.  -Patient underwent hemodialysis yesterday.  No urgent indication for dialysis today.  Reevaluate tomorrow.  2.  Acute respiratory failure Extubated 6/4 am Currently on high flow nasal cannula.  3. Metabolic acidosis -Continue to monitor serum bicarbonate.  4. Drug overdose multiple tablets of Phenergan, losartan, amlodipine, clonidine IVC Sitter at bedside  5.  Hypokalemia Potassium currently 4.2 and normalized.     LOS: 8 Raylyn Speckman 6/9/20202:03 PM  [place], [place]  Note: This note was prepared with Dragon dictation. Any transcription errors are unintentional

## 2019-03-17 NOTE — Progress Notes (Signed)
Pt became nauseous and threw up a small amount of clear liquid. Pt breathing is rapid and he is diaphoretic. Pt asked this nurse to "intubate him". PRN haldol given to try and calm him down with minilmal success. Pt is c/o pain but cannot pinpoint what and where. RR 55, O2 sats 90%. Pt still c.o SOB. MD Mansy made aware. RT contacted for assessment and 1 mg ativan ordered. STAT ABG ordered.

## 2019-03-17 NOTE — Progress Notes (Signed)
Spoke with patient's mother on phone and she was updated on patient's condition.

## 2019-03-17 NOTE — Progress Notes (Signed)
Pt has been improved from previous night however is still c/o pain. Pt stated he was still having chest pain and then later stated his back hurt. Pt has also spent much of the shift talking to people that were not present in the room. Pt has also exhibited hallucinations of cameras in the room and phones and cameras in the bed with him even though none are present in the room. MD Mansy made aware. PRN percocet and haldol ordered. Will continue to monitor.

## 2019-03-17 NOTE — Consult Note (Signed)
Pharmacy Electrolyte Monitoring Consult:  Pharmacy consulted to assist in monitoring and replacing electrolytes in this      Labs:   Potassium (mmol/L)  Date Value  03/17/2019 4.2   Magnesium (mg/dL)  Date Value  03/17/2019 2.6 (H)   Phosphorus (mg/dL)  Date Value  03/17/2019 5.3 (H)   Calcium (mg/dL)  Date Value  03/17/2019 8.5 (L)   Albumin (g/dL)  Date Value  03/16/2019 3.8   Corrected Ca: 8.66 mg/dL  Assessment/Plan: 28 year old male with a medical history as indicated below who presented to the ED after taking 38 tablets of Phenergan, 19 tablets of losartan, 42 tablets of amlodipine, and 78 tablets of clonazepam and noted electrolyte abnormalities. Furosemide 40 mg IV x 1 dose ordered 6/8.   6/8: K 3.4 (0341)- patient received KCL 10 mEq x2 runs after labs was drawn. Scr 6.08 and Mg 2.5.  Nephrology following. Per nephrology, no acute indication for dialysis at present. CRRT stopped.    6/9: K 4.2 Scr 5.96 Ca 8.5 Phos 5.4 >> 5.3 Mg 2.6. Patient is being followed by nephrology and will sent to ICU.   Will check electrolytes with AM labs.   Pharmacy will continue to monitor and adjust per consult.   Kristeen Miss PharmD Clinical Pharmacist 03/17/2019

## 2019-03-17 NOTE — Progress Notes (Signed)
Patient tachypniec with O2 sats maintaining upper 80's, c/o pain in chest and back. PRN Morphine and breathing treatment given. RT asked to lay eyes on patient. ICU charge RN called to bedside, MD paged.   Order given from ICU MD to transfer.  Patient to transfer to ICU.

## 2019-03-17 NOTE — Progress Notes (Addendum)
SLP Cancellation Note  Patient Details Name: Glenn Rasmussen MRN: [id number] DOB: 06-02-1991   Cancelled treatment:       Reason Eval/Treat Not Completed: Patient not medically ready;Medical issues which prohibited therapy(chart reviewed; consulted NSG)  Per NSG, pt is being transferred to CCU this morning d/t medical and respiratory issues. He is not eating d/t N/V. ST services will f/u w/ pt's status next 1-2 days. Recommend frequent oral care for hygiene and stimulation of swallowing.    Orinda Kenner, MS, CCC-SLP Watson,Katherine 03/17/2019, 9:03 AM

## 2019-03-17 NOTE — Progress Notes (Signed)
[facility] NAME: Glenn Rasmussen    MR#:  [id number]  DATE OF BIRTH:  [birth year]  SUBJECTIVE:   -Patient transferred  Back to ICU.  Currently on high flow nasal cannula oxygen. Patient had large amount of emesis this morning. Amended respiratory distress. NG tube placed. About 375 mL came out. X-ray abdomen shows ileus.  REVIEW OF SYSTEMS:  Review of Systems  Constitutional: Negative for chills, fever and weight loss.  HENT: Negative for ear discharge, ear pain and nosebleeds.   Eyes: Negative for blurred vision, pain and discharge.  Respiratory: Positive for shortness of breath. Negative for sputum production, wheezing and stridor.   Cardiovascular: Positive for chest pain. Negative for palpitations, orthopnea and PND.  Gastrointestinal: Negative for abdominal pain, diarrhea, nausea and vomiting.  Genitourinary: Negative for frequency and urgency.  Musculoskeletal: Negative for back pain and joint pain.  Neurological: Positive for weakness. Negative for sensory change, speech change and focal weakness.  Psychiatric/Behavioral: Negative for depression and hallucinations. The patient is nervous/anxious.     DRUG ALLERGIES:  No Known Allergies  VITALS:  Blood pressure (!) 108/57, pulse 96, temperature 98.9 F (37.2 C), temperature source Oral, resp. rate (!) 33, height 5\' 7"  (1.702 m), weight 106.6 kg, SpO2 93 %.  PHYSICAL EXAMINATION:  Physical Exam   GENERAL:  28 y.o.-year-old critically ill-appearing patient lying in the bed with mild acute distress. obese EYES: Pupils equal, round, reactive to light and accommodation. No scleral icterus. Extraocular muscles intact.  HEENT: Head atraumatic, normocephalic. Oropharynx and nasopharynx clear. NG+ NECK:  Supple, no jugular venous distention. No thyroid enlargement, no tenderness.   LUNGS: distant breath sounds bilaterally, no wheezing, ++rales,norhonchi or crepitation. Mild use  of accessory muscles of respiration.  Decreased bibasilar breath sounds CARDIOVASCULAR: S1, S2 normal. No murmurs, rubs, or gallops.  ABDOMEN: Soft, nontender, nondistended. Bowel sounds present. No organomegaly or mass.  EXTREMITIES: No  cyanosis, or clubbing.  1+ pedal edema noted NEUROLOGIC: Patient he is alert but remains oriented x2.   PSYCHIATRIC: The patient is alert and oriented x2 anxious SKIN: No obvious rash, lesion, or ulcer.    LABORATORY PANEL:   CBC Recent Labs  Lab 03/17/19 0536  WBC 18.1*  HGB 7.9*  HCT 23.7*  PLT 364   ------------------------------------------------------------------------------------------------------------------  Chemistries  Recent Labs  Lab 03/17/19 0536  NA 137  K 4.2  CL 99  CO2 19*  GLUCOSE 132*  BUN 76*  CREATININE 5.96*  CALCIUM 8.5*  MG 2.6*  AST 58*  ALT 50*  ALKPHOS 53  BILITOT 0.8   ------------------------------------------------------------------------------------------------------------------  Cardiac Enzymes Recent Labs  Lab 03/16/19 1634  TROPONINI 0.26*   ------------------------------------------------------------------------------------------------------------------  RADIOLOGY:  Ct Abdomen Pelvis Wo Contrast  Result Date: 03/17/2019 CLINICAL DATA:  Overdose, abdominal distension EXAM: CT ABDOMEN AND PELVIS WITHOUT CONTRAST TECHNIQUE: Multidetector CT imaging of the abdomen and pelvis was performed following the standard protocol without IV contrast. COMPARISON:  04/27/2007 FINDINGS: Lower chest: Small bilateral pleural effusions and associated atelectasis or consolidation. Hepatobiliary: No focal liver abnormality is seen. Status post cholecystectomy. No biliary dilatation. Pancreas: Unremarkable. No pancreatic ductal dilatation or surrounding inflammatory changes. Spleen: Normal in size without significant abnormality. Adrenals/Urinary Tract: Adrenal glands are unremarkable. Kidneys are normal, without renal  calculi, solid lesion, or hydronephrosis. Foley catheter in the bladder Stomach/Bowel: Appendix is surgically absent. The stomach, small bowel, and colon are diffusely distended by air and fluid, with fluid present to the rectum.  Vascular/Lymphatic: Right femoral central venous catheter. No enlarged abdominal or pelvic lymph nodes. Reproductive: No mass or other significant abnormality. Other: No abdominal wall hernia or abnormality. Small volume ascites. Anasarca. Musculoskeletal: No acute or significant osseous findings. IMPRESSION: 1. The stomach, small bowel, and colon are diffusely distended by air and fluid, with fluid present to the rectum. Findings are consistent with ileus. 2.  Pleural effusions, ascites, and anasarca. 3.  Status post cholecystectomy and appendectomy. Electronically Signed   By: Eddie Candle M.D.   On: 03/17/2019 08:35   Dg Abd 1 View  Result Date: 03/17/2019 CLINICAL DATA:  Check gastric catheter placement EXAM: ABDOMEN - 1 VIEW COMPARISON:  None. FINDINGS: Gastric catheter is noted coiled within the stomach. Scattered large and small bowel gas is seen similar to that seen on prior CT examination. No free air is noted. IMPRESSION: Gastric catheter coiled within the stomach. Electronically Signed   By: Inez Catalina M.D.   On: 03/17/2019 10:09   Nm Pulmonary Perf And Vent  Result Date: 03/16/2019 CLINICAL DATA:  Shortness of breath EXAM: NUCLEAR MEDICINE VENTILATION - PERFUSION LUNG SCAN VIEWS: Anterior and posterior: Ventilation and perfusion. Patient was unable to tolerate additional imaging. RADIOPHARMACEUTICALS:  32.1 mCi of Tc-48m DTPA aerosol inhalation and 4.38 mCi Tc20m MAA IV COMPARISON:  Chest radiograph March 15, 2017 FINDINGS: Ventilation: Radiotracer uptake is homogeneous and symmetric bilaterally. No evident ventilation defects. Perfusion: Radiotracer uptake is homogeneous and symmetric bilaterally. No perfusion defects evident. IMPRESSION: No ventilation or perfusion  defects. Very low probability of pulmonary embolus. Electronically Signed   By: Lowella Grip III M.D.   On: 03/16/2019 13:22   Dg Chest Port 1 View  Result Date: 03/17/2019 CLINICAL DATA:  Respiratory failure, distress EXAM: PORTABLE CHEST 1 VIEW COMPARISON:  03/16/2019 FINDINGS: Left central line is unchanged. NG tube placement into the stomach. Bilateral interstitial and airspace opacities are similar prior study. Mild cardiomegaly. Small bilateral effusions. No acute bony abnormality. IMPRESSION: Diffuse interstitial and airspace opacities are similar prior study. Small effusions. Electronically Signed   By: Rolm Baptise M.D.   On: 03/17/2019 10:08   Dg Chest Port 1 View  Result Date: 03/16/2019 CLINICAL DATA:  Chest pain. EXAM: PORTABLE CHEST 1 VIEW COMPARISON:  One-view chest x-ray 03/13/2019 FINDINGS: A left IJ line is stable. Heart size is exaggerated by low lung volumes. Patchy bilateral interstitial and airspace opacities have increased. Airspace disease is asymmetric on the left. No definite effusions are present. Visualized soft tissues and bony thorax are unremarkable. IMPRESSION: 1. Increasing interstitial and airspace opacities, left greater than right. Findings are compatible with ARDS or infection. 2. Stable left IJ line. Electronically Signed   By: San Morelle M.D.   On: 03/16/2019 04:57    EKG:   Orders placed or performed during the hospital encounter of 03/08/19   ED EKG   ED EKG   ED EKG   ED EKG   EKG 12-Lead   EKG 12-Lead   EKG 12-Lead   EKG 12-Lead   EKG 12-Lead   EKG 12-Lead   EKG 12-Lead   EKG 12-Lead   EKG 12-Lead   EKG 12-Lead   EKG 12-Lead   EKG 12-Lead   EKG 12-Lead   EKG 12-Lead   EKG - 12 lead   EKG - 12 lead   EKG 12-Lead   EKG 12-Lead    ASSESSMENT AND PLAN:   28 year old male with past medical history significant for hypertension, IBS admitted secondary to overdose  on Phenergan, losartan, Norvasc and  Klonopin  1.  Acute Hypoxic respiratory failure due to volume overload/?ARDS -Patient is extubated on 03/12/2019 -currently on high flow nasal cannula oxygen. Patient went into respiratory distress transfer to ICU. -sats 93-94% -ABG stat--shows resp alkalosis/with hypoxia -EKG--sinus tachycardia -VQ scan low probability for PE. D/ced heparin drip.  2.Intentional drug overdose -patient overdosed on 38 tablets of Phenergan, 19 tablets of losartan, 42 tablets of Norvasc and 78 tablets of Klonopin -Urine tox positive for benzos and tricyclics -Appreciate psych consult.  Once patient is medically stable, will need inpatient psychiatry admission.  3.  Acute renal failure-likely ATN from hypotension.  -received on CRRT, metabolic acidosis.  Appreciate nephrology consult - CRRTon hold for now-- now on hemodialysis. Patient got dialyzed on 6/8 /2020 -decision for dialysis on daily basis.  4.   Pneumonia-follow-up procalcitonin and WBC. - Currently on Rocephin-- discontinued -wbc 21K--18K  5.  DVT prophylaxis- on heparin SQ  6.  GERD-Protonix  7.  Elevated troponin-likely demand ischemia, troponin plateaued vs due to renal failure -  Most recent echo this admission showing normal LV function and no wall motion abnormalities.  8.Anemia appears due to current illness with renal failure No active bleed   D/w dr Duwayne Heck  CODE STATUS: Full code  TOTAL criticalTIME TAKING CARE OF THIS PATIENT: 35 minutes.   POSSIBLE D/C IN ? DAYS, DEPENDING ON CLINICAL CONDITION.   Fritzi Mandes M.D on 03/17/2019 at 3:08 PM  Between 7am to 6pm - Pager - [phone]  After 6pm go to [email or web address] - password [email or web address]  Office  [phone]  CC: Primary care physician; Maeola Sarah, MD

## 2019-03-17 NOTE — Consult Note (Signed)
[facility] Face-to-Face Psychiatry Consult follow-up  Reason for Consult: Suicide attempt by overdose Referring Physician: ICU provider Patient Identification: Glenn Rasmussen MRN:  [id number] Principal Diagnosis: Multiple drug overdose Diagnosis:  Principal Problem:   Multiple drug overdose Active Problems:   Suicide attempt ([place])   HTN (hypertension)   AKI (acute kidney injury) ([place])   Acute respiratory failure ([place])   Antihypertensive agent overdose   Calcium channel blocker overdose   MDD (major depressive disorder), recurrent severe, without psychosis ([place])   Intentional benzodiazepine overdose ([place])  Patient is seen, chart is reviewed.  Patient has provided permission to speak with mother, Glenn Rasmussen [phone])  Total Time spent with patient: 35 minutes  Subjective: Patient receiving ventilatory support.  "I am feeling a little bit better."   HPI:  Glenn Rasmussen is a 28 y.o. male patient admitted with drug overdose and suicide attempt.  Patient presents to the ED reporting multiple drug overdose.  He states that he is depressed and "just does not care about life anymore".  He states that he took a number of medications tonight including benzodiazepine, calcium channel blocker, Phenergan, amlodipine, and "just did not care what happened".  He is currently hypotensive, though awake and alert.  Treatment for his overdose was initiated in the ED and hospitalist were called for admission.  Patient has been receiving dialysis treatments.  Early this morning 03/17/2019, patient had acute respiratory distress and abdominal pain and was returned to the intensive care unit.  Patient is now receiving ventilatory support, and has an NG tube in place to suction.  Surgical consultation is pending.  Psychiatry consult is requested for evaluation of ongoing suicidal ideation and treatment of depression.  Past Psychiatric History: Per medication review, patient appears to be treated for ADHD,  anxiety, and insomnia. Patient reports being on multiple antidepressants in the past, and states, "none of them worked for me.  They all made me feel sick to my stomach and caused me to vomit."  Risk to Self:  Yes Risk to Others:  Not known Prior Inpatient Therapy:  Not known Prior Outpatient Therapy:  Not known  On evaluation today, patient states that he is beginning to feel a little bit better.  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  He reports his biggest stress was the death of his mother, who is actually his stepmother but raised him.  He describes that he now that he has contact with his biological mother.  He is struggling currently with the fact that his biological mother gets to be with him at this time, but he is wishing that he had his stepmother at his bedside to help care for him.  Patient continues to endorse significant depression and is tearful during conversation.  He denies suicidal ideation, plan, and intent and states, "there must be some reason I survived."  Patient is denying HI or AVH.  Past Medical History:  Past Medical History:  Diagnosis Date  . Hypertension   . IBS (irritable bowel syndrome)     Past Surgical History:  Procedure Laterality Date  . APPENDECTOMY    . CHOLECYSTECTOMY    . TONSILLECTOMY     Family History:  Family History  Problem Relation Age of Onset  . Lung cancer Mother   . Lung cancer Father    Family Psychiatric  History: Unknown  Social History:  Social History  Substance and Sexual Activity  Alcohol Use No  . Frequency: Never     Social History   Substance and Sexual Activity  Drug Use No    Social History   Socioeconomic History  . Marital status: Single    Spouse name: Not on file  . Number of children: Not on file  . Years of education: Not on file  . Highest education level: Not on file  Occupational History  . Not  on file  Social Needs  . Financial resource strain: Not on file  . Food insecurity:    Worry: Not on file    Inability: Not on file  . Transportation needs:    Medical: Not on file    Non-medical: Not on file  Tobacco Use  . Smoking status: Never Smoker  . Smokeless tobacco: Never Used  Substance and Sexual Activity  . Alcohol use: No    Frequency: Never  . Drug use: No  . Sexual activity: Not on file  Lifestyle  . Physical activity:    Days per week: Not on file    Minutes per session: Not on file  . Stress: Not on file  Relationships  . Social connections:    Talks on phone: Not on file    Gets together: Not on file    Attends religious service: Not on file    Active member of club or organization: Not on file    Attends meetings of clubs or organizations: Not on file    Relationship status: Not on file  Other Topics Concern  . Not on file  Social History Narrative  . Not on file   Additional Social History:    Unknown  Allergies:  No Known Allergies  Labs:  Results for orders placed or performed during the hospital encounter of 03/08/19 (from the past 48 hour(s))  Magnesium     Status: Abnormal   Collection Time: 03/16/19  3:41 AM  Result Value Ref Range   Magnesium 2.5 (H) 1.7 - 2.4 mg/dL    Comment: Performed at [facility], [address] [place], [place] [address]  Comprehensive metabolic panel     Status: Abnormal   Collection Time: 03/16/19  3:41 AM  Result Value Ref Range   Sodium 134 (L) 135 - 145 mmol/L   Potassium 3.4 (L) 3.5 - 5.1 mmol/L   Chloride 99 98 - 111 mmol/L   CO2 18 (L) 22 - 32 mmol/L   Glucose, Bld 111 (H) 70 - 99 mg/dL   BUN 74 (H) 6 - 20 mg/dL   Creatinine, Ser 6.08 (H) 0.61 - 1.24 mg/dL   Calcium 8.5 (L) 8.9 - 10.3 mg/dL   Total Protein 6.8 6.5 - 8.1 g/dL   Albumin 3.8 3.5 - 5.0 g/dL   AST 59 (H) 15 - 41 U/L   ALT 43 0 - 44 U/L   Alkaline Phosphatase 52 38 - 126 U/L   Total Bilirubin 1.1 0.3 - 1.2 mg/dL   GFR  calc non Af Amer 11 (L) >60 mL/min   GFR calc Af Amer 13 (L) >60 mL/min   Anion gap 17 (H) 5 - 15    Comment: Performed at [facility], [address] [place], [place] [address]  Troponin I - Now Then Q6H     Status: Abnormal   Collection Time: 03/16/19  3:41 AM  Result Value Ref Range   Troponin I 0.31 (HH) <0.03 ng/mL    Comment: CRITICAL VALUE  NOTED. VALUE IS CONSISTENT WITH PREVIOUSLY REPORTED/CALLED VALUE. QSD Performed at [facility], [address] [place], [place] [address]   CBC with Differential/Platelet     Status: Abnormal   Collection Time: 03/16/19  3:41 AM  Result Value Ref Range   WBC 21.8 (H) 4.0 - 10.5 K/uL   RBC 2.89 (L) 4.22 - 5.81 MIL/uL   Hemoglobin 7.9 (L) 13.0 - 17.0 g/dL   HCT 23.3 (L) 39.0 - 52.0 %   MCV 80.6 80.0 - 100.0 fL   MCH 27.3 26.0 - 34.0 pg   MCHC 33.9 30.0 - 36.0 g/dL   RDW 13.3 11.5 - 15.5 %   Platelets 327 150 - 400 K/uL   nRBC 0.3 (H) 0.0 - 0.2 %   Neutrophils Relative % 75 %   Neutro Abs 16.2 (H) 1.7 - 7.7 K/uL   Lymphocytes Relative 12 %   Lymphs Abs 2.7 0.7 - 4.0 K/uL   Monocytes Relative 8 %   Monocytes Absolute 1.8 (H) 0.1 - 1.0 K/uL   Eosinophils Relative 1 %   Eosinophils Absolute 0.1 0.0 - 0.5 K/uL   Basophils Relative 0 %   Basophils Absolute 0.1 0.0 - 0.1 K/uL   Immature Granulocytes 4 %   Abs Immature Granulocytes 0.89 (H) 0.00 - 0.07 K/uL    Comment: Performed at [facility], [address] [place], [place] [address]  Glucose, capillary     Status: Abnormal   Collection Time: 03/16/19  3:41 AM  Result Value Ref Range   Glucose-Capillary 111 (H) 70 - 99 mg/dL   Comment 1 Notify RN    Comment 2 Document in Chart   Fibrin derivatives D-Dimer ([facility] only)     Status: Abnormal   Collection Time: 03/16/19  4:14 AM  Result Value Ref Range   Fibrin derivatives D-dimer ([facility]) 5,482.03 (H) 0.00 - 499.00 ng/mL (FEU)    Comment: (NOTE) <> Exclusion of Venous Thromboembolism (VTE) - OUTPATIENT  ONLY   (Emergency Department or [place])   0-499 ng/ml (FEU): With a low to intermediate pretest probability                      for VTE this test result excludes the diagnosis                      of VTE.   >499 ng/ml (FEU) : VTE not excluded; additional work up for VTE is                      required. <> Testing on Inpatients and Evaluation of Disseminated Intravascular   Coagulation (DIC) Reference Range:   0-499 ng/ml (FEU) Performed at [facility], [address] [place], [place] [address]   APTT     Status: None   Collection Time: 03/16/19  4:14 AM  Result Value Ref Range   aPTT 26 24 - 36 seconds    Comment: Performed at [facility], [address] [place], [place] [address]  Protime-INR     Status: None   Collection Time: 03/16/19  4:14 AM  Result Value Ref Range   Prothrombin Time 14.0 11.4 - 15.2 seconds   INR 1.1 0.8 - 1.2    Comment: (NOTE) INR goal varies based on device and disease states. Performed at [facility], [address], [place], [place] [address]   Blood gas, arterial     Status: Abnormal  Collection Time: 03/16/19  8:19 AM  Result Value Ref Range   FIO2 40.00    Delivery systems VENTURI MASK    pH, Arterial 7.50 (H) 7.350 - 7.450   pCO2 arterial 23 (L) 32.0 - 48.0 mmHg   pO2, Arterial 71 (L) 83.0 - 108.0 mmHg   Bicarbonate 17.9 (L) 20.0 - 28.0 mmol/L   Acid-base deficit 3.4 (H) 0.0 - 2.0 mmol/L   O2 Saturation 95.5 %   Patient temperature 37.0    Collection site RIGHT RADIAL    Sample type ARTERIAL DRAW    Allens test (pass/fail) PASS PASS    Comment: Performed at [facility], [address] [place], [place] [address]  Troponin I - Now Then Q6H     Status: Abnormal   Collection Time: 03/16/19  8:33 AM  Result Value Ref Range   Troponin I 0.26 (HH) <0.03 ng/mL    Comment: CRITICAL VALUE NOTED. VALUE IS CONSISTENT WITH PREVIOUSLY REPORTED/CALLED VALUE AKT Performed at [facility], [address] [place], [place] [address]   Troponin I - Now Then Q6H     Status: Abnormal   Collection Time: 03/16/19 11:07 AM  Result Value Ref Range   Troponin I 0.22 (HH) <0.03 ng/mL    Comment: CRITICAL VALUE NOTED. VALUE IS CONSISTENT WITH PREVIOUSLY REPORTED/CALLED VALUE AKT Performed at [facility], [place], [place] [address]   Heparin level (unfractionated)     Status: Abnormal   Collection Time: 03/16/19 11:07 AM  Result Value Ref Range   Heparin Unfractionated <0.10 (L) 0.30 - 0.70 IU/mL    Comment: REPEATED TO VERIFY (NOTE) If heparin results are below expected values, and patient dosage has  been confirmed, suggest follow up testing of antithrombin III levels. Performed at [facility], [address] [place], [place] [address]   Troponin I - Now Then Q6H     Status: Abnormal   Collection Time: 03/16/19  2:38 PM  Result Value Ref Range   Troponin I 0.33 (HH) <0.03 ng/mL    Comment: CRITICAL VALUE NOTED. VALUE IS CONSISTENT WITH PREVIOUSLY REPORTED/CALLED VALUE AKT Performed at [facility], [address] [place], [place] [address]   Phosphorus     Status: Abnormal   Collection Time: 03/16/19  2:38 PM  Result Value Ref Range   Phosphorus 5.4 (H) 2.5 - 4.6 mg/dL    Comment: Performed at [facility], [address] [place], [place] [address]  Troponin I - Now Then Q6H     Status: Abnormal   Collection Time: 03/16/19  4:34 PM  Result Value Ref Range   Troponin I 0.26 (HH) <0.03 ng/mL    Comment: CRITICAL VALUE NOTED. VALUE IS CONSISTENT WITH PREVIOUSLY REPORTED/CALLED VALUE JJB Performed at [facility], [address] [place], [place] [address]   Blood gas, arterial     Status: Abnormal   Collection Time: 03/17/19  5:01 AM  Result Value Ref Range   FIO2 1.00    Delivery systems HI FLOW NASAL CANNULA    pH, Arterial 7.56 (H) 7.350 - 7.450   pCO2 arterial 21 (L) 32.0 - 48.0 mmHg   pO2, Arterial  54 (L) 83.0 - 108.0 mmHg   Bicarbonate 18.8 (L) 20.0 - 28.0 mmol/L   Acid-base deficit 1.3 0.0 - 2.0 mmol/L   O2 Saturation 92.1 %   Patient temperature 37.0    Collection site RIGHT RADIAL    Sample type ARTERIAL DRAW  Allens test (pass/fail) PASS PASS    Comment: Performed at [facility], [address] [place], [place] [address]  Magnesium     Status: Abnormal   Collection Time: 03/17/19  5:36 AM  Result Value Ref Range   Magnesium 2.6 (H) 1.7 - 2.4 mg/dL    Comment: Performed at [facility], [address] [place], [place] [address]  Phosphorus     Status: Abnormal   Collection Time: 03/17/19  5:36 AM  Result Value Ref Range   Phosphorus 5.3 (H) 2.5 - 4.6 mg/dL    Comment: Performed at [facility], [address] [place], [place] [address]  Basic metabolic panel     Status: Abnormal   Collection Time: 03/17/19  5:36 AM  Result Value Ref Range   Sodium 137 135 - 145 mmol/L   Potassium 4.2 3.5 - 5.1 mmol/L   Chloride 99 98 - 111 mmol/L   CO2 19 (L) 22 - 32 mmol/L   Glucose, Bld 132 (H) 70 - 99 mg/dL   BUN 76 (H) 6 - 20 mg/dL   Creatinine, Ser 5.96 (H) 0.61 - 1.24 mg/dL   Calcium 8.5 (L) 8.9 - 10.3 mg/dL   GFR calc non Af Amer 12 (L) >60 mL/min   GFR calc Af Amer 13 (L) >60 mL/min   Anion gap 19 (H) 5 - 15    Comment: Performed at [facility], [address] [place], [place] [address]  CBC     Status: Abnormal   Collection Time: 03/17/19  5:36 AM  Result Value Ref Range   WBC 18.1 (H) 4.0 - 10.5 K/uL   RBC 2.94 (L) 4.22 - 5.81 MIL/uL   Hemoglobin 7.9 (L) 13.0 - 17.0 g/dL   HCT 23.7 (L) 39.0 - 52.0 %   MCV 80.6 80.0 - 100.0 fL   MCH 26.9 26.0 - 34.0 pg   MCHC 33.3 30.0 - 36.0 g/dL   RDW 13.8 11.5 - 15.5 %   Platelets 364 150 - 400 K/uL   nRBC 0.2 0.0 - 0.2 %    Comment: Performed at [facility], [address] [place], [place] [address]  Hepatic function panel     Status: Abnormal   Collection Time:  03/17/19  5:36 AM  Result Value Ref Range   Total Protein 7.0 6.5 - 8.1 g/dL   Albumin 3.8 3.5 - 5.0 g/dL   AST 58 (H) 15 - 41 U/L   ALT 50 (H) 0 - 44 U/L   Alkaline Phosphatase 53 38 - 126 U/L   Total Bilirubin 0.8 0.3 - 1.2 mg/dL   Bilirubin, Direct 0.2 0.0 - 0.2 mg/dL   Indirect Bilirubin 0.6 0.3 - 0.9 mg/dL    Comment: Performed at [facility], [address] [place], [place] [address]  TSH     Status: None   Collection Time: 03/17/19  5:36 AM  Result Value Ref Range   TSH 2.108 0.350 - 4.500 uIU/mL    Comment: Performed by a 3rd Generation assay with a functional sensitivity of <=0.01 uIU/mL. Performed at [facility], [address] [place], [place] [address]   Glucose, capillary     Status: Abnormal   Collection Time: 03/17/19  9:30 AM  Result Value Ref Range   Glucose-Capillary 144 (H) 70 - 99 mg/dL  Blood gas, arterial     Status: Abnormal   Collection Time: 03/17/19  9:37 AM  Result Value Ref Range  FIO2 0.80    pH, Arterial 7.55 (H) 7.350 - 7.450   pCO2 arterial 21 (L) 32.0 - 48.0 mmHg   pO2, Arterial 62 (L) 83.0 - 108.0 mmHg   Bicarbonate 18.4 (L) 20.0 - 28.0 mmol/L   Acid-base deficit 1.9 0.0 - 2.0 mmol/L   O2 Saturation 94.4 %   Patient temperature 37.0    Collection site RIGHT RADIAL    Sample type ARTERIAL DRAW    Allens test (pass/fail) PASS PASS    Comment: Performed at [facility], [address] [place], [place] [address]    Current Facility-Administered Medications  Medication Dose Route Frequency Provider Last Rate Last Dose  . 0.9 %  sodium chloride infusion   Intravenous PRN Fritzi Mandes, MD   Stopped at 03/16/19 1907  . albumin human 25 % solution 12.5 g  12.5 g Intravenous BID Murlean Iba, MD 60 mL/hr at 03/17/19 1347 12.5 g at 03/17/19 1347  . aspirin EC tablet 325 mg  325 mg Oral Daily Mansy, Jan A, MD   325 mg at 03/16/19 1044  . bisacodyl (DULCOLAX) suppository 10 mg  10 mg Rectal Daily PRN Tukov-Yual,  Magdalene S, NP      . cefTRIAXone (ROCEPHIN) 2 g in sodium chloride 0.9 % 100 mL IVPB  2 g Intravenous Q24H Fritzi Mandes, MD 200 mL/hr at 03/17/19 1818 2 g at 03/17/19 1818  . chlorhexidine (PERIDEX) 0.12 % solution 15 mL  15 mL Mouth Rinse BID Tyler Pita, MD   15 mL at 03/17/19 1323  . Chlorhexidine Gluconate Cloth 2 % PADS 6 each  6 each Topical Q0600 Lance Coon, MD   6 each at 03/16/19 [phone]  . furosemide (LASIX) injection 40 mg  40 mg Intravenous Once Fritzi Mandes, MD      . haloperidol lactate (HALDOL) injection 2 mg  2 mg Intramuscular Q6H PRN Mansy, Jan A, MD   2 mg at 03/17/19 0443  . heparin injection 5,000 Units  5,000 Units Subcutaneous Q8H Tyler Pita, MD   5,000 Units at 03/17/19 1321  . ipratropium-albuterol (DUONEB) 0.5-2.5 (3) MG/3ML nebulizer solution 3 mL  3 mL Nebulization Q6H PRN Flora Lipps, MD   3 mL at 03/17/19 0818  . MEDLINE mouth rinse  15 mL Mouth Rinse q12n4p Tyler Pita, MD   15 mL at 03/17/19 1549  . metoCLOPramide (REGLAN) injection 5 mg  5 mg Intravenous Q6H Tyler Pita, MD   5 mg at 03/17/19 1729  . morphine 2 MG/ML injection 2 mg  2 mg Intravenous Q2H PRN Darel Hong D, NP   2 mg at 03/17/19 0817  . nitroGLYCERIN (NITROSTAT) SL tablet 0.4 mg  0.4 mg Sublingual Q5 min PRN Fritzi Mandes, MD   0.4 mg at 03/16/19 0336  . oxyCODONE-acetaminophen (PERCOCET/ROXICET) 5-325 MG per tablet 1 tablet  1 tablet Oral Q4H PRN Mansy, Arvella Merles, MD   1 tablet at 03/17/19 0257  . senna (SENOKOT) tablet 8.6 mg  1 tablet Oral Daily PRN Flora Lipps, MD   8.6 mg at 03/15/19 1403  . sodium chloride flush (NS) 0.9 % injection 10-40 mL  10-40 mL Intracatheter PRN Tukov-Yual, Magdalene S, NP      . traZODone (DESYREL) tablet 100 mg  100 mg Oral QHS PRN Tukov-Yual, Magdalene S, NP   100 mg at 03/16/19 2226    Musculoskeletal: Strength & Muscle Tone: decreased Gait & Station: Not assessed Patient leans: N/A  Psychiatric Specialty Exam: Physical Exam  Nursing note and vitals reviewed. Constitutional: He is oriented to person, place, and time. He appears well-developed and well-nourished. He appears distressed.  HENT:  Head: Normocephalic and atraumatic.  Eyes: EOM are normal.  Neck: Normal range of motion.  Cardiovascular: Regular rhythm.  Tachycardia  Respiratory: He is in respiratory distress.  Musculoskeletal: Normal range of motion.  Neurological: He is alert and oriented to person, place, and time.    Review of Systems  Unable to perform ROS: Intubated  Constitutional: Positive for malaise/fatigue.  Respiratory: Positive for shortness of breath.   Cardiovascular: Negative.  Negative for chest pain and palpitations.  Gastrointestinal: Positive for abdominal pain and nausea.  Musculoskeletal: Positive for myalgias.  Neurological: Positive for weakness.  Psychiatric/Behavioral: Positive for depression and suicidal ideas.    Blood pressure 118/60, pulse (!) 102, temperature 97.8 F (36.6 C), temperature source Oral, resp. rate (!) 40, height 5\' 7"  (1.702 m), weight 106.6 kg, SpO2 99 %.Body mass index is 36.81 kg/m.  General Appearance: Fairly Groomed  Eye Contact:  Good  Speech:  Clear and Coherent  Volume:  Decreased  Mood:  Anxious and Depressed  Affect:  Congruent  Thought Process:  Coherent  Orientation:  Full (Time, Place, and Person)  Thought Content:  Hallucinations: None  Suicidal Thoughts:  Denies currently, following suicide attempt by overdose  Homicidal Thoughts:  No  Memory:  NA  Judgement:  Poor  Insight:  Present and Shallow  Psychomotor Activity:  Decreased  Concentration:  Concentration: Fair  Recall:  [place] of Knowledge:  Good  Language:  Fair  Akathisia:  No  Handed:  Right  AIMS (if indicated):     Assets:  Communication Skills Desire for Improvement Social Support patient has given consent to talk to mother, Glenn Rasmussen [phone]  ADL's:  Impaired  Cognition:  WNL  Sleep:    decreased     Treatment Plan Summary: Daily contact with patient to assess and evaluate symptoms and progress in treatment and Medication management  Hold psychiatric medication at this time due to medical acuity.  Disposition: Recommend psychiatric Inpatient admission when medically cleared. Supportive therapy provided about ongoing stressors.  patient has given consent to talk to mother, Glenn Rasmussen [phone]  Psychiatry will continue to follow and advise medication management as indicated.  Lavella Hammock, MD 03/17/2019 7:03 PM

## 2019-03-17 NOTE — Progress Notes (Signed)
Follow up - Critical Care Medicine Note  Patient Details:    Glenn Rasmussen is an 28 y.o. male.admitted with overdose on benzodiazepine, ARB, calcium channel blocker, and Phenergan.  Required intubation and hemodialysis for respiratory failure and renal failure.  Subsequently transferred out of the ICU.    Readmitted to ICU  due to respiratory distress.  Lines, Airways, Drains: CVC Triple Lumen 03/09/19 Right Femoral (Active)  Indication for Insertion or Continuance of Line Prolonged intravenous therapies 03/17/2019 12:00 PM  Site Assessment Clean;Dry;Intact 03/17/2019 12:00 PM  Proximal Lumen Status Flushed;Saline locked;Blood return noted 03/17/2019 12:00 PM  Medial Lumen Status Flushed;Saline locked;Blood return noted 03/17/2019 12:00 PM  Distal Lumen Status Flushed;Saline locked;Blood return noted 03/17/2019 12:00 PM  Dressing Type Transparent;Occlusive 03/17/2019 12:00 PM  Dressing Status Clean;Dry;Intact 03/17/2019 12:00 PM  Line Care Connections checked and tightened 03/17/2019 12:00 PM  Dressing Intervention Other (Comment) 03/17/2019 12:00 PM  Dressing Change Due 03/20/19 03/17/2019 12:00 PM     NG/OG Tube Nasogastric Left nare Documented cm marking at nare/ corner of mouth 75 cm (Active)  Cm Marking at Nare/Corner of Mouth (if applicable) 75 cm 0/06/3234  4:00 PM  Site Assessment Clean;Dry;Intact 03/17/2019  4:00 PM  Ongoing Placement Verification No change in cm markings or external length of tube from initial placement;No change in respiratory status;No acute changes, not attributed to clinical condition 03/17/2019  4:00 PM  Status Suction-low intermittent 03/17/2019  4:00 PM  Drainage Appearance Brown 03/17/2019  4:00 PM  Output (mL) 50 mL 03/17/2019  4:00 PM     Urethral Catheter Wells Guiles RN Non-latex 14 Fr. (Active)  Indication for Insertion or Continuance of Catheter Therapy based on hourly urine output monitoring and documentation for critical condition (NOT STRICT I&O) 03/17/2019  4:00 PM  Site  Assessment Clean;Intact;Dry 03/17/2019  4:00 PM  Catheter Maintenance Bag below level of bladder;Catheter secured;Drainage bag/tubing not touching floor;No dependent loops;Insertion date on drainage bag;Seal intact 03/17/2019  4:00 PM  Collection Container Standard drainage bag 03/17/2019  4:00 PM  Securement Method Leg strap 03/17/2019  4:00 PM  Urinary Catheter Interventions Unclamped 03/17/2019  4:00 PM  Output (mL) 100 mL 03/17/2019  4:00 PM    Anti-infectives:  Anti-infectives (From admission, onward)   Start     Dose/Rate Route Frequency Ordered Stop   03/11/19 1800  cefTRIAXone (ROCEPHIN) 2 g in sodium chloride 0.9 % 100 mL IVPB     2 g 200 mL/hr over 30 Minutes Intravenous Every 24 hours 03/11/19 1243 03/18/19 2359   03/11/19 1200  vancomycin (VANCOCIN) 1,500 mg in sodium chloride 0.9 % 500 mL IVPB  Status:  Discontinued     1,500 mg 250 mL/hr over 120 Minutes Intravenous  Once 03/11/19 1020 03/11/19 1032   03/11/19 1200  vancomycin (VANCOCIN) 1,500 mg in sodium chloride 0.9 % 500 mL IVPB     1,500 mg 250 mL/hr over 120 Minutes Intravenous  Once 03/11/19 1030 03/11/19 1333   03/10/19 1200  vancomycin (VANCOCIN) 1,500 mg in sodium chloride 0.9 % 500 mL IVPB  Status:  Discontinued     1,500 mg 250 mL/hr over 120 Minutes Intravenous  Once 03/10/19 1055 03/10/19 1114   03/10/19 1200  vancomycin (VANCOCIN) 1,500 mg in sodium chloride 0.9 % 500 mL IVPB     1,500 mg 250 mL/hr over 120 Minutes Intravenous  Once 03/10/19 1114 03/10/19 1345   03/09/19 0515  piperacillin-tazobactam (ZOSYN) IVPB 3.375 g  Status:  Discontinued     3.375 g 12.5  mL/hr over 240 Minutes Intravenous Every 8 hours 03/09/19 0507 03/11/19 1243      Microbiology: Results for orders placed or performed during the hospital encounter of 03/08/19  SARS Coronavirus 2 (CEPHEID - Performed in [facility] hospital lab), Hosp Order     Status: None   Collection Time: 03/08/19 11:48 PM  Result Value Ref Range Status   SARS  Coronavirus 2 NEGATIVE NEGATIVE Final    Comment: (NOTE) If result is NEGATIVE SARS-CoV-2 target nucleic acids are NOT DETECTED. The SARS-CoV-2 RNA is generally detectable in upper and lower  respiratory specimens during the acute phase of infection. The lowest  concentration of SARS-CoV-2 viral copies this assay can detect is 250  copies / mL. A negative result does not preclude SARS-CoV-2 infection  and should not be used as the sole basis for treatment or other  patient management decisions.  A negative result may occur with  improper specimen collection / handling, submission of specimen other  than nasopharyngeal swab, presence of viral mutation(s) within the  areas targeted by this assay, and inadequate number of viral copies  (<250 copies / mL). A negative result must be combined with clinical  observations, patient history, and epidemiological information. If result is POSITIVE SARS-CoV-2 target nucleic acids are DETECTED. The SARS-CoV-2 RNA is generally detectable in upper and lower  respiratory specimens dur ing the acute phase of infection.  Positive  results are indicative of active infection with SARS-CoV-2.  Clinical  correlation with patient history and other diagnostic information is  necessary to determine patient infection status.  Positive results do  not rule out bacterial infection or co-infection with other viruses. If result is PRESUMPTIVE POSTIVE SARS-CoV-2 nucleic acids MAY BE PRESENT.   A presumptive positive result was obtained on the submitted specimen  and confirmed on repeat testing.  While 2019 novel coronavirus  (SARS-CoV-2) nucleic acids may be present in the submitted sample  additional confirmatory testing may be necessary for epidemiological  and / or clinical management purposes  to differentiate between  SARS-CoV-2 and other Sarbecovirus currently known to infect humans.  If clinically indicated additional testing with an alternate test   methodology [phone]) is advised. The SARS-CoV-2 RNA is generally  detectable in upper and lower respiratory sp ecimens during the acute  phase of infection. The expected result is Negative. Fact Sheet for Patients:  StrictlyIdeas.no Fact Sheet for Healthcare Providers: BankingDealers.co.za This test is not yet approved or cleared by the [place] FDA and has been authorized for detection and/or diagnosis of SARS-CoV-2 by FDA under an Emergency Use Authorization (EUA).  This EUA will remain in effect (meaning this test can be used) for the duration of the COVID-19 declaration under Section 564(b)(1) of the Act, 21 U.S.C. section 360bbb-3(b)(1), unless the authorization is terminated or revoked sooner. Performed at [facility], [address] [place], [place] [address]   MRSA PCR Screening     Status: None   Collection Time: 03/09/19  2:48 AM  Result Value Ref Range Status   MRSA by PCR NEGATIVE NEGATIVE Final    Comment:        The GeneXpert MRSA Assay (FDA approved for NASAL specimens only), is one component of a comprehensive MRSA colonization surveillance program. It is not intended to diagnose MRSA infection nor to guide or monitor treatment for MRSA infections. Performed at [facility], [address] [place], [place] [address]   Culture, respiratory (non-expectorated)     Status: None  Collection Time: 03/09/19  8:00 AM  Result Value Ref Range Status   Specimen Description   Final    TRACHEAL ASPIRATE Performed at [facility], [address], [place], [place] [address]    Special Requests   Final    NONE Performed at [facility], [address] [place], [place] [address]    Gram Stain   Final    ABUNDANT WBC PRESENT,BOTH PMN AND MONONUCLEAR MODERATE GRAM POSITIVE COCCI RARE YEAST    Culture   Final    MODERATE GROUP B STREP(S.AGALACTIAE)ISOLATED TESTING AGAINST  S. AGALACTIAE NOT ROUTINELY PERFORMED DUE TO PREDICTABILITY OF AMP/PEN/VAN SUSCEPTIBILITY. Performed at [facility], [place] [address], [place], [place] [address]    Report Status 03/11/2019 FINAL  Final  CULTURE, BLOOD (ROUTINE X 2) w Reflex to ID Panel     Status: None   Collection Time: 03/09/19  2:53 PM  Result Value Ref Range Status   Specimen Description   Final    BLOOD BLOOD RIGHT ARM Performed at [facility], [address] [place], [place] [address]    Special Requests   Final    BOTTLES DRAWN AEROBIC AND ANAEROBIC Blood Culture results may not be optimal due to an excessive volume of blood received in culture bottles Performed at [facility], [address] [place], [place] [address]    Culture   Final    NO GROWTH 5 DAYS Performed at [facility], [place] [address] [place], [place] [address]    Report Status 03/17/2019 FINAL  Final  CULTURE, BLOOD (ROUTINE X 2) w Reflex to ID Panel     Status: None   Collection Time: 03/09/19  5:00 PM  Result Value Ref Range Status   Specimen Description   Final    BLOOD RIGHT ARM Performed at [facility], [address], [place], [place] [address]    Special Requests   Final    BOTTLES DRAWN AEROBIC AND ANAEROBIC Blood Culture results may not be optimal due to an excessive volume of blood received in culture bottles Performed at [facility], [address], [place], [place] [address]    Culture   Final    NO GROWTH 5 DAYS Performed at [facility], [place] [address], [place], [place] [address]    Report Status 03/15/2019 FINAL  Final  Urine Culture     Status: None   Collection Time: 03/11/19  4:33 AM  Result Value Ref Range Status   Specimen Description   Final    URINE, RANDOM Performed at [facility], [address], [place], [place] [address]    Special Requests   Final    NONE Performed at [facility], [address], [place], [place]  [address]    Culture   Final    NO GROWTH Performed at [facility], [place] [address] [place], [place] [address]    Report Status 03/12/2019 FINAL  Final    Best Practice/Protocols:  VTE Prophylaxis: Heparin (SQ)   Events: 5/31 admitted for severe shock and resp failure DRUG OD 6/1 started CRRT multiorgan failure, resp failure, vasopressors 6/2 severe hypoxia UF rates increased to 500 6/3 DNR, multiorgan failure 6/4 CODE STATUS changed to full code,high risk for reintubation severe hypoxia on CRRT 6/5 high flow , on CRRT, high risk for intubation 6/7 resp failure resolved, off CRRT, transfer to gen med floor 6/9 readmitted to ICU due to  respiratory distress, abdominal distention due to ileus.  Studies: Ct Abdomen Pelvis Wo Contrast  Result Date: 03/17/2019 CLINICAL DATA:  Overdose, abdominal distension EXAM: CT ABDOMEN AND PELVIS WITHOUT CONTRAST TECHNIQUE: Multidetector CT imaging of the abdomen and pelvis was performed following the standard protocol without IV contrast. COMPARISON:  04/27/2007 FINDINGS: Lower chest: Small bilateral pleural effusions and associated atelectasis or consolidation. Hepatobiliary: No focal liver abnormality is seen. Status post cholecystectomy. No biliary dilatation. Pancreas: Unremarkable. No pancreatic ductal dilatation or surrounding inflammatory changes. Spleen: Normal in size without significant abnormality. Adrenals/Urinary Tract: Adrenal glands are unremarkable. Kidneys are normal, without renal calculi, solid lesion, or hydronephrosis. Foley catheter in the bladder Stomach/Bowel: Appendix is surgically absent. The stomach, small bowel, and colon are diffusely distended by air and fluid, with fluid present to the rectum. Vascular/Lymphatic: Right femoral central venous catheter. No enlarged abdominal or pelvic lymph nodes. Reproductive: No mass or other significant abnormality. Other: No abdominal wall hernia or abnormality. Small volume ascites.  Anasarca. Musculoskeletal: No acute or significant osseous findings. IMPRESSION: 1. The stomach, small bowel, and colon are diffusely distended by air and fluid, with fluid present to the rectum. Findings are consistent with ileus. 2.  Pleural effusions, ascites, and anasarca. 3.  Status post cholecystectomy and appendectomy. Electronically Signed   By: Eddie Candle M.D.   On: 03/17/2019 08:35   Dg Abd 1 View  Result Date: 03/17/2019 CLINICAL DATA:  Check gastric catheter placement EXAM: ABDOMEN - 1 VIEW COMPARISON:  None. FINDINGS: Gastric catheter is noted coiled within the stomach. Scattered large and small bowel gas is seen similar to that seen on prior CT examination. No free air is noted. IMPRESSION: Gastric catheter coiled within the stomach. Electronically Signed   By: Inez Catalina M.D.   On: 03/17/2019 10:09   Dg Abd 1 View  Result Date: 03/14/2019 CLINICAL DATA:  28 year old male with history of abdominal pain. EXAM: ABDOMEN - 1 VIEW COMPARISON:  03/09/2019. FINDINGS: Right femoral central venous catheter with tip projecting over the expected location of the proximal right common femoral vein. Gas and stool are seen scattered throughout the colon extending to the level of the distal rectum. No pathologic distension of small bowel is noted. Several nondilated gas-filled loops of small bowel or noted projecting over the central abdomen. No gross evidence of pneumoperitoneum. IMPRESSION: 1. Nonspecific, nonobstructive bowel gas pattern, as above. 2. No pneumoperitoneum. Electronically Signed   By: Vinnie Langton M.D.   On: 03/14/2019 22:34   Dg Abd 1 View  Result Date: 03/09/2019 CLINICAL DATA:  28 year old male presenting after drug overdose. EXAM: ABDOMEN - 1 VIEW COMPARISON:  Portable chest today. CT Abdomen and Pelvis 05/02/2007. FINDINGS: Semi upright AP view at 0510 hours. Enteric tube side hole is at the level of the gastric body. Nonobstructed bowel-gas pattern. No osseous abnormality  identified. IMPRESSION: Enteric tube side hole at the level of the gastric body. Normal visible bowel gas pattern. Electronically Signed   By: Genevie Ann M.D.   On: 03/09/2019 05:46   Ct Head Wo Contrast  Result Date: 03/13/2019 CLINICAL DATA:  Drug overdose EXAM: CT HEAD WITHOUT CONTRAST TECHNIQUE: Contiguous axial images were obtained from the base of the skull through the vertex without intravenous contrast. COMPARISON:  Head CT 12/29/2006 FINDINGS: Brain: There is no mass, hemorrhage or extra-axial collection. The size and configuration of the ventricles and extra-axial CSF spaces are normal. The brain parenchyma is normal, without acute or chronic infarction. Vascular: No abnormal hyperdensity of  the major intracranial arteries or dural venous sinuses. No intracranial atherosclerosis. Skull: The visualized skull base, calvarium and extracranial soft tissues are normal. Sinuses/Orbits: No fluid levels or advanced mucosal thickening of the visualized paranasal sinuses. No mastoid or middle ear effusion. The orbits are normal. IMPRESSION: Normal head CT. Electronically Signed   By: Ulyses Jarred M.D.   On: 03/13/2019 02:30   Nm Pulmonary Perf And Vent  Result Date: 03/16/2019 CLINICAL DATA:  Shortness of breath EXAM: NUCLEAR MEDICINE VENTILATION - PERFUSION LUNG SCAN VIEWS: Anterior and posterior: Ventilation and perfusion. Patient was unable to tolerate additional imaging. RADIOPHARMACEUTICALS:  32.1 mCi of Tc-53m DTPA aerosol inhalation and 4.38 mCi Tc2m MAA IV COMPARISON:  Chest radiograph March 15, 2017 FINDINGS: Ventilation: Radiotracer uptake is homogeneous and symmetric bilaterally. No evident ventilation defects. Perfusion: Radiotracer uptake is homogeneous and symmetric bilaterally. No perfusion defects evident. IMPRESSION: No ventilation or perfusion defects. Very low probability of pulmonary embolus. Electronically Signed   By: Lowella Grip III M.D.   On: 03/16/2019 13:22   Dg Chest Port 1  View  Result Date: 03/17/2019 CLINICAL DATA:  Respiratory failure, distress EXAM: PORTABLE CHEST 1 VIEW COMPARISON:  03/16/2019 FINDINGS: Left central line is unchanged. NG tube placement into the stomach. Bilateral interstitial and airspace opacities are similar prior study. Mild cardiomegaly. Small bilateral effusions. No acute bony abnormality. IMPRESSION: Diffuse interstitial and airspace opacities are similar prior study. Small effusions. Electronically Signed   By: Rolm Baptise M.D.   On: 03/17/2019 10:08   Dg Chest Port 1 View  Result Date: 03/16/2019 CLINICAL DATA:  Chest pain. EXAM: PORTABLE CHEST 1 VIEW COMPARISON:  One-view chest x-ray 03/13/2019 FINDINGS: A left IJ line is stable. Heart size is exaggerated by low lung volumes. Patchy bilateral interstitial and airspace opacities have increased. Airspace disease is asymmetric on the left. No definite effusions are present. Visualized soft tissues and bony thorax are unremarkable. IMPRESSION: 1. Increasing interstitial and airspace opacities, left greater than right. Findings are compatible with ARDS or infection. 2. Stable left IJ line. Electronically Signed   By: San Morelle M.D.   On: 03/16/2019 04:57   Dg Chest Port 1 View  Result Date: 03/13/2019 CLINICAL DATA:  Acute respiratory failure EXAM: PORTABLE CHEST 1 VIEW COMPARISON:  03/11/2019 FINDINGS: The left-sided catheter tip projects over the SVC. The heart size is stable from prior study. There is worsening vascular congestion in worsening bilateral airspace opacities. There are likely bilateral pleural effusions. No evidence of a pneumothorax. The patient has been extubated. Enteric tube has been removed. IMPRESSION: 1. Status post extubation. 2. Remaining lines and tubes as above. 3. Worsening vascular congestion. Persistent small bilateral pleural effusions. Electronically Signed   By: Constance Holster M.D.   On: 03/13/2019 03:46   Dg Chest Port 1 View  Result Date:  03/11/2019 CLINICAL DATA:  Acute respiratory failure EXAM: PORTABLE CHEST 1 VIEW COMPARISON:  03/10/2019 FINDINGS: Cardiac shadow is stable. Endotracheal tube, gastric catheter and left jugular central line are again seen and stable. Increasing vascular congestion is noted when compare with the prior exam with enlarging left pleural effusion. No focal confluent infiltrate is seen. IMPRESSION: Worsening vascular congestion with left-sided pleural effusion. Tubes and lines as described. Electronically Signed   By: Inez Catalina M.D.   On: 03/11/2019 07:07   Dg Chest Port 1 View  Result Date: 03/10/2019 CLINICAL DATA:  Hypoxia.  Patient on a ventilator. EXAM: PORTABLE CHEST 1 VIEW COMPARISON:  Earlier film, same date. FINDINGS:  The endotracheal tube is in good position, 5 cm above the carina. The NG tube is coursing down the esophagus and into the stomach. The left IJ central venous catheter is stable. The lungs demonstrate improved aeration compared to the earlier film. Less edema and atelectasis. No pneumothorax. IMPRESSION: 1. Support apparatus in good position without complicating features. 2. Improved lung aeration since earlier chest film. Electronically Signed   By: Marijo Sanes M.D.   On: 03/10/2019 20:35   Portable Chest Xray  Result Date: 03/10/2019 CLINICAL DATA:  Respiratory failure EXAM: PORTABLE CHEST 1 VIEW COMPARISON:  03/09/2019 FINDINGS: Endotracheal tube tip at the clavicular heads. The orogastric tube at least reaches the stomach. Left IJ dialysis catheter tip at the upper cavoatrial junction. Worsening aeration with obscured left diaphragm. There is diffuse hazy lung opacity. Suspect layering pleural effusions. Cardiomegaly and vascular pedicle widening. IMPRESSION: Stable, unremarkable hardware positioning. Worsening aeration, likely atelectasis, layering fluid, and edema. Electronically Signed   By: Monte Fantasia M.D.   On: 03/10/2019 06:22   Dg Chest Port 1 View  Result Date:  03/09/2019 CLINICAL DATA:  Central line placement EXAM: PORTABLE CHEST 1 VIEW COMPARISON:  03/09/2019 FINDINGS: The endotracheal tube terminates above the carina. The newly placed left-sided central venous catheter tip projects over the SVC/distal left brachiocephalic vein. The enteric tube extends below the left hemidiaphragm. There is no left-sided pneumothorax. The heart size is stable. No right-sided pneumothorax. Coarsened lung markings are again identified bilaterally. IMPRESSION: 1. Newly placed left-sided central venous catheter as above with no evidence of a pneumothorax. 2. Remaining lines and tubes as above. 3. Stable cardiac size. Persistent coarse airspace opacities bilaterally similar to prior study. Electronically Signed   By: Constance Holster M.D.   On: 03/09/2019 19:58   Dg Chest Port 1 View  Result Date: 03/09/2019 CLINICAL DATA:  28 year old male presenting after drug overdose. EXAM: PORTABLE CHEST 1 VIEW COMPARISON:  02/09/2019 and earlier. FINDINGS: Portable AP semi upright view at 0510 hours. Intubated, endotracheal tube tip at the level the clavicles. Enteric tube courses to the abdomen, tip not included. Normal cardiac size and mediastinal contours. Patchy and confluent perihilar and lung base opacity. No superimposed pneumothorax. No overt edema. No pleural effusion identified. Paucity of bowel gas in the upper abdomen. No osseous abnormality identified. IMPRESSION: 1. Endotracheal tube tip at the level the clavicles. Enteric tube courses to the abdomen, tip not included. 2. Patchy and confluent perihilar and lung base opacity. Consider aspiration. No pulmonary edema or pleural effusion. Electronically Signed   By: Genevie Ann M.D.   On: 03/09/2019 05:45    Consults: Treatment Team:  Murlean Iba, MD Lavella Hammock, MD   Subjective:    Overnight Issues: Over the last 2 days has had issues with abdominal distention.  Not passing flatus.  Today developed respiratory distress with  tachypnea and increased FiO2 requirements.  Currently on high flow O2.  He is anxious with respiratory rate in the 50s.  Significant abdominal distention.  Objective:  Vital signs for last 24 hours: Temp:  [97.8 F (36.6 C)-100.1 F (37.8 C)] 97.8 F (36.6 C) (06/09 1600) Pulse Rate:  [93-115] 101 (06/09 1600) Resp:  [19-56] 34 (06/09 1600) BP: (104-135)/(55-64) 126/60 (06/09 1600) SpO2:  [86 %-100 %] 86 % (06/09 1600) FiO2 (%):  [95 %-98 %] 98 % (06/09 1600) Weight:  [106.6 kg] 106.6 kg (06/09 0314)  Hemodynamic parameters for last 24 hours:    Intake/Output from previous day: 06/08 0701 -  06/09 0700 In: 100 [P.O.:100] Out: 5750 [Urine:3250]  Intake/Output this shift: Total I/O In: 3 [I.V.:3] Out: 625 [Urine:200; Emesis/NG output:425]  Vent settings for last 24 hours: FiO2 (%):  [95 %-98 %] 98 %  Physical Exam:  General: Overweight male, in  acute respiratory distress/tachypneic HEENT: PERRLA, trachea midline, no JVD,  Neuro:  Anxious, no focal deficits Cardiovascular: Apical pulse regular, S1-S2, no murmur regurg or gallop, +2 pulses bilaterally, no edema Lungs: No wheezes or rhonchi noted.  Some basilar atelectatic crackles.  Increase use of accessories. Abdomen: Very distended, firm, diminished to absent bowel sounds. Musculoskeletal: No joint deformities, positive range of motion Skin: Warm and dry, no rash or lesions  Assessment/Plan:   1.  Acute respiratory distress: This is due to severe abdominal distention from ileus.  Continue high flow O2.  NG tube inserted to decompress stomach and abdomen.  Supportive care.  2.  Ileus: Replace electrolytes as needed.  NG tube placed and placed to intermittent wall suction.  So far over 500 mL's of material out.  Supportive care.  CT abdomen and pelvis showed just ileus.  Discontinue narcotics is not necessary for pain as these can aggravate the issue.  Reglan 4 times a day.  Monitor.  3.  Acute renal failure: Nonoliguric.   Continue Foley catheter due to need for strict INO's.  Follow renal function.  Hemodialysis as needed.  Avoid nephrotoxins.  4.  Group B strep pneumonia: Complete antibiotics.  Currently on ceftriaxone.  5.  Nausea and vomiting: Antiemetics.     LOS: 8 days   Additional comments: Multidisciplinary rounds were performed with the ICU team.  Discussed with Dr. Fritzi Mandes.  Critical Care Total Time*: 45 Minutes  C. Derrill Kay, MD [facility] 03/17/2019  *Care during the described time interval was provided by me and/or other providers on the critical care team.  I have reviewed this patient's available data, including medical history, events of note, physical examination and test results as part of my evaluation.

## 2019-03-17 NOTE — Progress Notes (Signed)
Pt sent to CT for abd and pelvis. MD updated by RT. Awaiting CT results.

## 2019-03-18 ENCOUNTER — Inpatient Hospital Stay: Payer: BC Managed Care – PPO

## 2019-03-18 DIAGNOSIS — R0603 Acute respiratory distress: Secondary | ICD-10-CM

## 2019-03-18 LAB — CBC
HCT: 21.7 % — ABNORMAL LOW (ref 39.0–52.0)
Hemoglobin: 7.2 g/dL — ABNORMAL LOW (ref 13.0–17.0)
MCH: 27.3 pg (ref 26.0–34.0)
MCHC: 33.2 g/dL (ref 30.0–36.0)
MCV: 82.2 fL (ref 80.0–100.0)
Platelets: 323 10*3/uL (ref 150–400)
RBC: 2.64 MIL/uL — ABNORMAL LOW (ref 4.22–5.81)
RDW: 13.8 % (ref 11.5–15.5)
WBC: 14.2 10*3/uL — ABNORMAL HIGH (ref 4.0–10.5)
nRBC: 0.1 % (ref 0.0–0.2)

## 2019-03-18 LAB — BASIC METABOLIC PANEL
Anion gap: 17 — ABNORMAL HIGH (ref 5–15)
BUN: 99 mg/dL — ABNORMAL HIGH (ref 6–20)
CO2: 21 mmol/L — ABNORMAL LOW (ref 22–32)
Calcium: 8 mg/dL — ABNORMAL LOW (ref 8.9–10.3)
Chloride: 102 mmol/L (ref 98–111)
Creatinine, Ser: 7.62 mg/dL — ABNORMAL HIGH (ref 0.61–1.24)
GFR calc Af Amer: 10 mL/min — ABNORMAL LOW (ref >60)
GFR calc non Af Amer: 9 mL/min — ABNORMAL LOW (ref >60)
Glucose, Bld: 122 mg/dL — ABNORMAL HIGH (ref 70–99)
Potassium: 4 mmol/L (ref 3.5–5.1)
Sodium: 140 mmol/L (ref 135–145)

## 2019-03-18 LAB — HEPATITIS PANEL, ACUTE
HCV Ab: <0.1 s/co ratio (ref 0.0–0.9)
Hep A IgM: NEGATIVE
Hep B C IgM: NEGATIVE
Hepatitis B Surface Ag: NEGATIVE

## 2019-03-18 LAB — MAGNESIUM: Magnesium: 2.8 mg/dL — ABNORMAL HIGH (ref 1.7–2.4)

## 2019-03-18 LAB — PHOSPHORUS
Phosphorus: 7.9 mg/dL — ABNORMAL HIGH (ref 2.5–4.6)
Phosphorus: 5.5 mg/dL — ABNORMAL HIGH (ref 2.5–4.6)

## 2019-03-18 LAB — GLUCOSE, CAPILLARY: Glucose-Capillary: 116 mg/dL — ABNORMAL HIGH (ref 70–99)

## 2019-03-18 MED ORDER — SODIUM CHLORIDE 0.9 % IV SOLN
3.0000 g | Freq: Two times a day (BID) | INTRAVENOUS | Status: DC
  Administered 2019-03-18 – 2019-03-19 (×3): 3 g via INTRAVENOUS
  Filled 2019-03-18 (×5): qty 3

## 2019-03-18 MED ORDER — SODIUM CHLORIDE 0.9% FLUSH: 10.0000 mL | INTRAVENOUS | Status: DC | PRN

## 2019-03-18 MED ORDER — SODIUM CHLORIDE 0.9% FLUSH
10.0000 mL | Freq: Two times a day (BID) | INTRAVENOUS | Status: DC
  Administered 2019-03-19 – 2019-03-20 (×3): 10 mL

## 2019-03-18 MED ORDER — PANTOPRAZOLE SODIUM 40 MG PO PACK: 40.0000 mg | PACK | Freq: Every day | ORAL | Status: DC

## 2019-03-18 MED ORDER — CHLORHEXIDINE GLUCONATE 0.12 % MT SOLN
15.0000 mL | Freq: Two times a day (BID) | OROMUCOSAL | Status: DC
  Administered 2019-03-18: 10:00:00 15 mL via OROMUCOSAL

## 2019-03-18 MED ORDER — MIRTAZAPINE 15 MG PO TBDP
15.0000 mg | ORAL_TABLET | Freq: Every day | ORAL | Status: DC
  Administered 2019-03-18 – 2019-03-25 (×4): 15 mg via ORAL
  Filled 2019-03-18 (×9): qty 1

## 2019-03-18 MED ORDER — ORAL CARE MOUTH RINSE
15.0000 mL | Freq: Two times a day (BID) | OROMUCOSAL | Status: DC
  Administered 2019-03-18 (×2): 15 mL via OROMUCOSAL

## 2019-03-18 MED ORDER — LORAZEPAM 2 MG/ML IJ SOLN
1.0000 mg | INTRAMUSCULAR | Status: DC | PRN
  Administered 2019-03-18 – 2019-03-19 (×4): 1 mg via INTRAVENOUS
  Filled 2019-03-18 (×4): qty 1

## 2019-03-18 MED ORDER — ACETAMINOPHEN 10 MG/ML IV SOLN
1000.0000 mg | Freq: Four times a day (QID) | INTRAVENOUS | Status: DC | PRN
  Administered 2019-03-19: 1000 mg via INTRAVENOUS
  Filled 2019-03-18 (×3): qty 100

## 2019-03-18 MED ORDER — MORPHINE SULFATE (PF) 2 MG/ML IV SOLN
2.0000 mg | INTRAVENOUS | Status: DC | PRN
  Administered 2019-03-18 – 2019-03-19 (×3): 2 mg via INTRAVENOUS
  Filled 2019-03-18 (×4): qty 1

## 2019-03-18 NOTE — Consult Note (Signed)
[facility] Face-to-Face Psychiatry Consult follow-up  Reason for Consult: Suicide attempt by overdose Referring Physician: ICU provider Patient Identification: Glenn Rasmussen MRN:  [id number] Principal Diagnosis: Multiple drug overdose Diagnosis:  Principal Problem:   Multiple drug overdose Active Problems:   Suicide attempt ([place])   HTN (hypertension)   AKI (acute kidney injury) ([place])   Acute respiratory failure ([place])   Antihypertensive agent overdose   Calcium channel blocker overdose   MDD (major depressive disorder), recurrent severe, without psychosis ([place])   Intentional benzodiazepine overdose ([place])  Patient is seen, chart is reviewed.  Patient has provided permission to speak with mother, [name] [phone])  Total Time spent with patient: 25 min  Subjective: Patient receiving ventilatory support.  "I'm depressed"  HPI:  Glenn Rasmussen is a 28 y.o. male patient admitted with drug overdose and suicide attempt.  Patient presents to the ED reporting multiple drug overdose.  He states that he is depressed and "just does not care about life anymore".  He states that he took a number of medications tonight including benzodiazepine, calcium channel blocker, Phenergan, amlodipine, and "just did not care what happened".  He is currently hypotensive, though awake and alert.  Treatment for his overdose was initiated in the ED and hospitalist were called for admission.  Patient has been receiving dialysis treatments.  Early this morning 03/17/2019, patient had acute respiratory distress and abdominal pain and was returned to the intensive care unit.  Patient is now receiving ventilatory support, and has an NG tube in place to suction.  Surgical consultation is pending.  Psychiatry consult is requested for evaluation of ongoing suicidal ideation and treatment of depression.  Past Psychiatric History: Per medication review, patient appears to be treated for ADHD, anxiety, and  insomnia. Patient reports being on multiple antidepressants in the past, and states, "none of them worked for me.  They all made me feel sick to my stomach and caused me to vomit."  Risk to Self:  Yes Risk to Others:  Not known Prior Inpatient Therapy:  Not known Prior Outpatient Therapy:  Not known  On evaluation today, patient is in bed receiving dialysis.  He is tearful and states that he is depressed.  He does however deny suicidal thoughts.  Patient has been requesting fluids, however due to bowel obstruction he has NG tube in place and cannot take p.o. at this time.  Patient denies HI and AVH.  He is agreeable to starting medications for depression.  Will order mirtazapine ODT due to patient's n.p.o. status.  Patient is agreeable.  Past Medical History:  Past Medical History:  Diagnosis Date  . Hypertension   . IBS (irritable bowel syndrome)     Past Surgical History:  Procedure Laterality Date  . APPENDECTOMY    . CHOLECYSTECTOMY    . TONSILLECTOMY     Family History:  Family History  Problem Relation Age of Onset  . Lung cancer Mother   . Lung cancer Father    Family Psychiatric  History: Unknown  Social History:  Social History   Substance and Sexual Activity  Alcohol Use No  . Frequency: Never     Social History   Substance and Sexual Activity  Drug Use No    Social History   Socioeconomic History  . Marital status: Single    Spouse name: Not on file  . Number of children: Not on file  . Years of education: Not on file  . Highest education level: Not on file  Occupational History  . Not on file  Social Needs  . Financial resource strain: Not on file  . Food insecurity:    Worry: Not on file    Inability: Not on file  . Transportation needs:    Medical: Not on file    Non-medical: Not on file  Tobacco Use  . Smoking status: Never Smoker  . Smokeless tobacco: Never Used  Substance and Sexual Activity  . Alcohol use: No    Frequency: Never  .  Drug use: No  . Sexual activity: Not on file  Lifestyle  . Physical activity:    Days per week: Not on file    Minutes per session: Not on file  . Stress: Not on file  Relationships  . Social connections:    Talks on phone: Not on file    Gets together: Not on file    Attends religious service: Not on file    Active member of club or organization: Not on file    Attends meetings of clubs or organizations: Not on file    Relationship status: Not on file  Other Topics Concern  . Not on file  Social History Narrative  . Not on file   Additional Social History:    Unknown  Allergies:  No Known Allergies  Labs:  Results for orders placed or performed during the hospital encounter of 03/08/19 (from the past 48 hour(s))  Blood gas, arterial     Status: Abnormal   Collection Time: 03/17/19  5:01 AM  Result Value Ref Range   FIO2 1.00    Delivery systems HI FLOW NASAL CANNULA    pH, Arterial 7.56 (H) 7.350 - 7.450   pCO2 arterial 21 (L) 32.0 - 48.0 mmHg   pO2, Arterial 54 (L) 83.0 - 108.0 mmHg   Bicarbonate 18.8 (L) 20.0 - 28.0 mmol/L   Acid-base deficit 1.3 0.0 - 2.0 mmol/L   O2 Saturation 92.1 %   Patient temperature 37.0    Collection site RIGHT RADIAL    Sample type ARTERIAL DRAW    Allens test (pass/fail) PASS PASS    Comment: Performed at [facility], [address] [place], [place] [address]  Magnesium     Status: Abnormal   Collection Time: 03/17/19  5:36 AM  Result Value Ref Range   Magnesium 2.6 (H) 1.7 - 2.4 mg/dL    Comment: Performed at [facility], [address] [place], [place] [address]  Phosphorus     Status: Abnormal   Collection Time: 03/17/19  5:36 AM  Result Value Ref Range   Phosphorus 5.3 (H) 2.5 - 4.6 mg/dL    Comment: Performed at [facility], [address] [place],  [address]  Basic metabolic panel     Status: Abnormal   Collection Time: 03/17/19  5:36 AM  Result Value Ref Range   Sodium 137 135 -  145 mmol/L   Potassium 4.2 3.5 - 5.1 mmol/L   Chloride 99 98 - 111 mmol/L   CO2 19 (L) 22 - 32 mmol/L   Glucose, Bld 132 (H) 70 - 99 mg/dL   BUN 76 (H) 6 - 20 mg/dL   Creatinine, Ser 5.96 (H) 0.61 - 1.24 mg/dL   Calcium 8.5 (L) 8.9 - 10.3 mg/dL   GFR calc non Af Amer 12 (L) >60 mL/min   GFR calc Af Amer 13 (L) >60 mL/min   Anion gap 19 (H) 5 - 15    Comment: Performed at  [facility], [address] [place], [place] [address]  CBC     Status: Abnormal   Collection Time: 03/17/19  5:36 AM  Result Value Ref Range   WBC 18.1 (H) 4.0 - 10.5 K/uL   RBC 2.94 (L) 4.22 - 5.81 MIL/uL   Hemoglobin 7.9 (L) 13.0 - 17.0 g/dL   HCT 23.7 (L) 39.0 - 52.0 %   MCV 80.6 80.0 - 100.0 fL   MCH 26.9 26.0 - 34.0 pg   MCHC 33.3 30.0 - 36.0 g/dL   RDW 13.8 11.5 - 15.5 %   Platelets 364 150 - 400 K/uL   nRBC 0.2 0.0 - 0.2 %    Comment: Performed at [facility], [address] [place], [place] [address]  Hepatitis panel, acute     Status: None   Collection Time: 03/17/19  5:36 AM  Result Value Ref Range   Hepatitis B Surface Ag Negative Negative   HCV Ab <0.1 0.0 - 0.9 s/co ratio    Comment: (NOTE)                                  Negative:     < 0.8                             Indeterminate: 0.8 - 0.9                                  Positive:     > 0.9 The CDC recommends that a positive HCV antibody result be followed up with a HCV Nucleic Acid Amplification test ([id number]). Performed At: [facility] [place], [place] [id number] Rush Farmer MD IO:[id number]    Hep A IgM Negative Negative   Hep B C IgM Negative Negative  Hepatic function panel     Status: Abnormal   Collection Time: 03/17/19  5:36 AM  Result Value Ref Range   Total Protein 7.0 6.5 - 8.1 g/dL   Albumin 3.8 3.5 - 5.0 g/dL   AST 58 (H) 15 - 41 U/L   ALT 50 (H) 0 - 44 U/L   Alkaline Phosphatase 53 38 - 126 U/L   Total Bilirubin 0.8 0.3 - 1.2 mg/dL   Bilirubin, Direct 0.2 0.0 - 0.2  mg/dL   Indirect Bilirubin 0.6 0.3 - 0.9 mg/dL    Comment: Performed at [facility], [address] [place], [place] [address]  TSH     Status: None   Collection Time: 03/17/19  5:36 AM  Result Value Ref Range   TSH 2.108 0.350 - 4.500 uIU/mL    Comment: Performed by a 3rd Generation assay with a functional sensitivity of <=0.01 uIU/mL. Performed at [facility], [address] [place], [place] [address]   Glucose, capillary     Status: Abnormal   Collection Time: 03/17/19  9:30 AM  Result Value Ref Range   Glucose-Capillary 144 (H) 70 - 99 mg/dL  Blood gas, arterial     Status: Abnormal   Collection Time: 03/17/19  9:37 AM  Result Value Ref Range   FIO2 0.80    pH, Arterial 7.55 (H) 7.350 - 7.450   pCO2 arterial 21 (L) 32.0 - 48.0 mmHg   pO2, Arterial 62 (L) 83.0 - 108.0 mmHg  Bicarbonate 18.4 (L) 20.0 - 28.0 mmol/L   Acid-base deficit 1.9 0.0 - 2.0 mmol/L   O2 Saturation 94.4 %   Patient temperature 37.0    Collection site RIGHT RADIAL    Sample type ARTERIAL DRAW    Allens test (pass/fail) PASS PASS    Comment: Performed at [facility], [address] [place], [place] [address]  Magnesium     Status: Abnormal   Collection Time: 03/18/19  3:50 AM  Result Value Ref Range   Magnesium 2.8 (H) 1.7 - 2.4 mg/dL    Comment: Performed at [facility], [address], [place], [place] [address]  Basic metabolic panel     Status: Abnormal   Collection Time: 03/18/19  3:50 AM  Result Value Ref Range   Sodium 140 135 - 145 mmol/L   Potassium 4.0 3.5 - 5.1 mmol/L   Chloride 102 98 - 111 mmol/L   CO2 21 (L) 22 - 32 mmol/L   Glucose, Bld 122 (H) 70 - 99 mg/dL   BUN 99 (H) 6 - 20 mg/dL    Comment: RESULT CONFIRMED BY MANUAL DILUTION...[place]   Creatinine, Ser 7.62 (H) 0.61 - 1.24 mg/dL   Calcium 8.0 (L) 8.9 - 10.3 mg/dL   GFR calc non Af Amer 9 (L) >60 mL/min   GFR calc Af Amer 10 (L) >60 mL/min   Anion gap 17 (H) 5 - 15    Comment:  Performed at [facility], [address] [place], [place] [address]  Phosphorus     Status: Abnormal   Collection Time: 03/18/19  3:50 AM  Result Value Ref Range   Phosphorus 7.9 (H) 2.5 - 4.6 mg/dL    Comment: Performed at [facility], [address] [place], [place] [address]  CBC     Status: Abnormal   Collection Time: 03/18/19  3:50 AM  Result Value Ref Range   WBC 14.2 (H) 4.0 - 10.5 K/uL   RBC 2.64 (L) 4.22 - 5.81 MIL/uL   Hemoglobin 7.2 (L) 13.0 - 17.0 g/dL   HCT 21.7 (L) 39.0 - 52.0 %   MCV 82.2 80.0 - 100.0 fL   MCH 27.3 26.0 - 34.0 pg   MCHC 33.2 30.0 - 36.0 g/dL   RDW 13.8 11.5 - 15.5 %   Platelets 323 150 - 400 K/uL   nRBC 0.1 0.0 - 0.2 %    Comment: Performed at [facility], [address] [place], [place] [address]  Glucose, capillary     Status: Abnormal   Collection Time: 03/18/19  7:34 AM  Result Value Ref Range   Glucose-Capillary 116 (H) 70 - 99 mg/dL  Phosphorus     Status: Abnormal   Collection Time: 03/18/19 12:09 PM  Result Value Ref Range   Phosphorus 5.5 (H) 2.5 - 4.6 mg/dL    Comment: Performed at [facility], [address] [place],  [address]    Current Facility-Administered Medications  Medication Dose Route Frequency Provider Last Rate Last Dose  . 0.9 %  sodium chloride infusion   Intravenous PRN Fritzi Mandes, MD   Stopped at 03/18/19 1430  . acetaminophen (OFIRMEV) IV 1,000 mg  1,000 mg Intravenous Q6H PRN Tyler Pita, MD      . albumin human 25 % solution 12.5 g  12.5 g Intravenous BID Murlean Iba, MD 60 mL/hr at 03/18/19 1131 12.5 g at 03/18/19 1131  . Ampicillin-Sulbactam (UNASYN) 3 g in sodium chloride 0.9 %  100 mL IVPB  3 g Intravenous Q12H Hallaji, Dani Gobble, RPH   Stopped at 03/18/19 1410  . aspirin EC tablet 325 mg  325 mg Oral Daily [place], Jan A, MD   325 mg at 03/16/19 1044  . bisacodyl (DULCOLAX) suppository 10 mg  10 mg Rectal Daily PRN Tukov-Yual, Arlyss Gandy, NP      .  Chlorhexidine Gluconate Cloth 2 % PADS 6 each  6 each Topical Q0600 Lance Coon, MD   6 each at 03/16/19 [phone]  . furosemide (LASIX) injection 40 mg  40 mg Intravenous Once Fritzi Mandes, MD      . haloperidol lactate (HALDOL) injection 2 mg  2 mg Intramuscular Q6H PRN [place], Jan A, MD   2 mg at 03/18/19 0200  . heparin injection 5,000 Units  5,000 Units Subcutaneous Q8H Tyler Pita, MD   5,000 Units at 03/18/19 1340  . ipratropium-albuterol (DUONEB) 0.5-2.5 (3) MG/3ML nebulizer solution 3 mL  3 mL Nebulization Q6H PRN Flora Lipps, MD   3 mL at 03/17/19 0818  . LORazepam (ATIVAN) injection 1 mg  1 mg Intravenous Q4H PRN Tyler Pita, MD   1 mg at 03/18/19 1834  . MEDLINE mouth rinse  15 mL Mouth Rinse q12n4p Darel Hong D, NP   15 mL at 03/18/19 1523  . metoCLOPramide (REGLAN) injection 5 mg  5 mg Intravenous Q6H Tyler Pita, MD   5 mg at 03/18/19 1728  . morphine 2 MG/ML injection 2 mg  2 mg Intravenous Q3H PRN Darel Hong D, NP   2 mg at 03/18/19 2007  . nitroGLYCERIN (NITROSTAT) SL tablet 0.4 mg  0.4 mg Sublingual Q5 min PRN Fritzi Mandes, MD   0.4 mg at 03/16/19 0336  . pantoprazole sodium (PROTONIX) 40 mg/20 mL oral suspension 40 mg  40 mg Oral Daily Tyler Pita, MD      . senna (SENOKOT) tablet 8.6 mg  1 tablet Oral Daily PRN Flora Lipps, MD   8.6 mg at 03/15/19 1403  . [START ON 03/19/2019] sodium chloride flush (NS) 0.9 % injection 10-40 mL  10-40 mL Intracatheter Q12H Darel Hong D, NP      . sodium chloride flush (NS) 0.9 % injection 10-40 mL  10-40 mL Intracatheter PRN [name], NP        Musculoskeletal: Strength & Muscle Tone: decreased Gait & Station: Not assessed Patient leans: N/A  Psychiatric Specialty Exam: Physical Exam  Nursing note and vitals reviewed. Constitutional: He is oriented to person, place, and time. He appears well-developed and well-nourished. He appears distressed.  HENT:  Head: Normocephalic and atraumatic.   Eyes: EOM are normal.  Neck: Normal range of motion.  Cardiovascular: Regular rhythm.  Tachycardia  Respiratory: He is in respiratory distress.  Musculoskeletal: Normal range of motion.  Neurological: He is alert and oriented to person, place, and time.    Review of Systems  Unable to perform ROS: Intubated  Constitutional: Positive for malaise/fatigue.  Respiratory: Positive for shortness of breath.   Cardiovascular: Negative.  Negative for chest pain and palpitations.  Gastrointestinal: Positive for abdominal pain and nausea.  Musculoskeletal: Positive for myalgias.  Neurological: Positive for weakness.  Psychiatric/Behavioral: Positive for depression and suicidal ideas.    Blood pressure 125/71, pulse (!) 114, temperature 98.1 F (36.7 C), temperature source Oral, resp. rate (!) 24, height 5\' 7"  (1.702 m), weight 102.3 kg, SpO2 (!) 82 %.Body mass index is 35.32 kg/m.  General Appearance: Fairly Groomed  Eye Contact:  Good  Speech:  Clear and Coherent  Volume:  Decreased  Mood:  Anxious and Depressed  Affect:  Depressed and Tearful  Thought Process:  Coherent  Orientation:  Full (Time, Place, and Person)  Thought Content:  Hallucinations: None  Suicidal Thoughts:  Denies currently, following suicide attempt by overdose  Homicidal Thoughts:  No  Memory:  NA  Judgement:  Poor  Insight:  Present and Shallow  Psychomotor Activity:  Decreased  Concentration:  Concentration: Fair  Recall:  [place] of Knowledge:  Good  Language:  Fair  Akathisia:  No  Handed:  Right  AIMS (if indicated):     Assets:  Communication Skills Desire for Improvement Social Support patient has given consent to talk to mother, [name] [phone]  ADL's:  Impaired  Cognition:  WNL  Sleep:   decreased     Treatment Plan Summary: Daily contact with patient to assess and evaluate symptoms and progress in treatment and Medication management  Start Remeron SolTab 15 mg at bedtime for  depression and anxiety.  Disposition: Recommend psychiatric Inpatient admission when medically cleared. Supportive therapy provided about ongoing stressors.  patient has given consent to talk to mother, [name] [phone]  Psychiatry will continue to follow and advise medication management as indicated.  Lavella Hammock, MD 03/18/2019 9:03 PM

## 2019-03-18 NOTE — Progress Notes (Signed)
Post HD Assessment  Breathing significantly improved post tx, 2532mL removed. 100%O2, RR decreased from 50 pre tx to 25 post tx.    03/18/19 1321  Neurological  Level of Consciousness Alert  Orientation Level Oriented to person;Oriented to place;Oriented to time;Disoriented to situation  Respiratory  Respiratory Pattern Unlabored  Chest Assessment Chest expansion symmetrical  Bilateral Breath Sounds Diminished  Cardiac  Pulse Regular  Heart Sounds S1, S2  Cardiac Rhythm ST  Vascular  R Radial Pulse +2  L Radial Pulse +2  Edema Generalized;Right upper extremity;Left upper extremity;Right lower extremity;Left lower extremity  Generalized Edema +1  RUE Edema +1  LUE Edema +1  RLE Edema +1  LLE Edema +1  Integumentary  Integumentary (WDL) X  Skin Color Appropriate for ethnicity  Skin Condition Dry  Skin Integrity Other (Comment) (HD pt)  Musculoskeletal  Musculoskeletal (WDL) X  Generalized Weakness Yes  Gastrointestinal  Bowel Sounds Assessment Hypoactive;Faint  GU Assessment  Genitourinary (WDL) X  Genitourinary Symptoms Urinary Catheter;Other (Comment) (HD pt)  Urine Characteristics  Urine Color Yellow/straw  Urine Appearance Clear  Psychosocial  Psychosocial (WDL) X  Patient Behaviors Calm;Withdrawn  Emotional support given Given to patient

## 2019-03-18 NOTE — Progress Notes (Signed)
HD Tx End  Breathing significantly improved post tx, 2573mL removed. 100%O2, RR decreased from 50 pre tx to 25 post tx.    03/18/19 1300  Hand-Off documentation  Report given to (Full Name) Jeanine Luz RN  Report received from (Full Name) Trellis Paganini RN  Vital Signs  Temp 98.9 F (37.2 C)  Temp Source Oral  Pulse Rate 96  Pulse Rate Source Monitor  Resp (!) 25  BP 114/62  BP Location Left Arm  BP Method Automatic  Patient Position (if appropriate) Lying  Oxygen Therapy  SpO2 100 %  O2 Flow Rate (L/min) 45 L/min  Critical Care Pain Observation Tool (CPOT)  Facial Expression 0  Body Movements 0  Muscle Tension 0  Compliance with ventilator (intubated pts.) N/A  Vocalization (extubated pts.) 0  CPOT Total 0  During Hemodialysis Assessment  Blood Flow Rate (mL/min) 200 mL/min  Arterial Pressure (mmHg) -100 mmHg  Venous Pressure (mmHg) 90 mmHg  Transmembrane Pressure (mmHg) 70 mmHg  Ultrafiltration Rate (mL/min) 860 mL/min  Dialysate Flow Rate (mL/min) 800 ml/min  Conductivity: Machine  14  HD Safety Checks Performed Yes  Dialysis Fluid Bolus Normal Saline  Bolus Amount (mL) 250 mL  Intra-Hemodialysis Comments Tx completed  Post-Hemodialysis Assessment  Rinseback Volume (mL) 250 mL  Dialyzer Clearance Lightly streaked  Duration of HD Treatment -hour(s) 3.5 hour(s)  Hemodialysis Intake (mL) 500 mL  UF Total -Machine (mL) 3009 mL  Net UF (mL) 2509 mL  Tolerated HD Treatment Yes  Hemodialysis Catheter Left Internal jugular Double-lumen  Placement Date/Time: 03/09/19 2037   Placed prior to admission: No  Time Out: Correct patient;Correct site;Correct procedure  Maximum sterile barrier precautions: Sterile gown;Mask;Cap;Large sterile sheet;Sterile gloves;Hand hygiene  Site Prep: Chlorh...  Site Condition No complications  Blue Lumen Status Flushed;Capped (Central line);Heparin locked  Red Lumen Status Flushed;Capped (Central line);Heparin locked  Purple Lumen Status N/A   Catheter fill solution Heparin 1000 units/ml  Catheter fill volume (Arterial) 1.3 cc  Catheter fill volume (Venous) 1.3  Dressing Type Biopatch;Occlusive  Dressing Status Clean;Dry;Intact;Dressing reinforced  Interventions Dressing reinforced  Drainage Description None  Dressing Change Due 03/20/19  Post treatment catheter status Capped and Clamped

## 2019-03-18 NOTE — Progress Notes (Signed)
Follow up - Critical Care Medicine Note  Patient Details:    Glenn Rasmussen is an 28 y.o. male.admitted with overdose on benzodiazepine, ARB, calcium channel blocker, and Phenergan.  Required intubation and hemodialysis for respiratory failure and renal failure.  Subsequently transferred out of the ICU.    Readmitted to ICU  due to respiratory distress.  Lines, Airways, Drains: CVC Triple Lumen 03/09/19 Right Femoral (Active)  Indication for Insertion or Continuance of Line Prolonged intravenous therapies 03/17/2019 12:00 PM  Site Assessment Clean;Dry;Intact 03/17/2019 12:00 PM  Proximal Lumen Status Flushed;Saline locked;Blood return noted 03/17/2019 12:00 PM  Medial Lumen Status Flushed;Saline locked;Blood return noted 03/17/2019 12:00 PM  Distal Lumen Status Flushed;Saline locked;Blood return noted 03/17/2019 12:00 PM  Dressing Type Transparent;Occlusive 03/17/2019 12:00 PM  Dressing Status Clean;Dry;Intact 03/17/2019 12:00 PM  Line Care Connections checked and tightened 03/17/2019 12:00 PM  Dressing Intervention Other (Comment) 03/17/2019 12:00 PM  Dressing Change Due 03/20/19 03/17/2019 12:00 PM     NG/OG Tube Nasogastric Left nare Documented cm marking at nare/ corner of mouth 75 cm (Active)  Cm Marking at Nare/Corner of Mouth (if applicable) 75 cm 11/14/9483  4:00 PM  Site Assessment Clean;Dry;Intact 03/17/2019  4:00 PM  Ongoing Placement Verification No change in cm markings or external length of tube from initial placement;No change in respiratory status;No acute changes, not attributed to clinical condition 03/17/2019  4:00 PM  Status Suction-low intermittent 03/17/2019  4:00 PM  Drainage Appearance Brown 03/17/2019  4:00 PM  Output (mL) 50 mL 03/17/2019  4:00 PM     Urethral Catheter Wells Guiles RN Non-latex 14 Fr. (Active)  Indication for Insertion or Continuance of Catheter Therapy based on hourly urine output monitoring and documentation for critical condition (NOT STRICT I&O) 03/17/2019  4:00 PM  Site  Assessment Clean;Intact;Dry 03/17/2019  4:00 PM  Catheter Maintenance Bag below level of bladder;Catheter secured;Drainage bag/tubing not touching floor;No dependent loops;Insertion date on drainage bag;Seal intact 03/17/2019  4:00 PM  Collection Container Standard drainage bag 03/17/2019  4:00 PM  Securement Method Leg strap 03/17/2019  4:00 PM  Urinary Catheter Interventions Unclamped 03/17/2019  4:00 PM  Output (mL) 100 mL 03/17/2019  4:00 PM    Anti-infectives:  Anti-infectives (From admission, onward)   Start     Dose/Rate Route Frequency Ordered Stop   03/18/19 0200  Ampicillin-Sulbactam (UNASYN) 3 g in sodium chloride 0.9 % 100 mL IVPB     3 g 200 mL/hr over 30 Minutes Intravenous Every 12 hours 03/18/19 0044     03/17/19 2100  metroNIDAZOLE (FLAGYL) IVPB 500 mg  Status:  Discontinued     500 mg 100 mL/hr over 60 Minutes Intravenous Every 8 hours 03/17/19 2048 03/18/19 0031   03/11/19 1800  cefTRIAXone (ROCEPHIN) 2 g in sodium chloride 0.9 % 100 mL IVPB  Status:  Discontinued     2 g 200 mL/hr over 30 Minutes Intravenous Every 24 hours 03/11/19 1243 03/18/19 0048   03/11/19 1200  vancomycin (VANCOCIN) 1,500 mg in sodium chloride 0.9 % 500 mL IVPB  Status:  Discontinued     1,500 mg 250 mL/hr over 120 Minutes Intravenous  Once 03/11/19 1020 03/11/19 1032   03/11/19 1200  vancomycin (VANCOCIN) 1,500 mg in sodium chloride 0.9 % 500 mL IVPB     1,500 mg 250 mL/hr over 120 Minutes Intravenous  Once 03/11/19 1030 03/11/19 1333   03/10/19 1200  vancomycin (VANCOCIN) 1,500 mg in sodium chloride 0.9 % 500 mL IVPB  Status:  Discontinued  1,500 mg 250 mL/hr over 120 Minutes Intravenous  Once 03/10/19 1055 03/10/19 1114   03/10/19 1200  vancomycin (VANCOCIN) 1,500 mg in sodium chloride 0.9 % 500 mL IVPB     1,500 mg 250 mL/hr over 120 Minutes Intravenous  Once 03/10/19 1114 03/10/19 1345   03/09/19 0515  piperacillin-tazobactam (ZOSYN) IVPB 3.375 g  Status:  Discontinued     3.375 g 12.5 mL/hr  over 240 Minutes Intravenous Every 8 hours 03/09/19 0507 03/11/19 1243      Microbiology: Results for orders placed or performed during the hospital encounter of 03/08/19  SARS Coronavirus 2 (CEPHEID - Performed in [facility] hospital lab), Hosp Order     Status: None   Collection Time: 03/08/19 11:48 PM  Result Value Ref Range Status   SARS Coronavirus 2 NEGATIVE NEGATIVE Final    Comment: (NOTE) If result is NEGATIVE SARS-CoV-2 target nucleic acids are NOT DETECTED. The SARS-CoV-2 RNA is generally detectable in upper and lower  respiratory specimens during the acute phase of infection. The lowest  concentration of SARS-CoV-2 viral copies this assay can detect is 250  copies / mL. A negative result does not preclude SARS-CoV-2 infection  and should not be used as the sole basis for treatment or other  patient management decisions.  A negative result may occur with  improper specimen collection / handling, submission of specimen other  than nasopharyngeal swab, presence of viral mutation(s) within the  areas targeted by this assay, and inadequate number of viral copies  (<250 copies / mL). A negative result must be combined with clinical  observations, patient history, and epidemiological information. If result is POSITIVE SARS-CoV-2 target nucleic acids are DETECTED. The SARS-CoV-2 RNA is generally detectable in upper and lower  respiratory specimens dur ing the acute phase of infection.  Positive  results are indicative of active infection with SARS-CoV-2.  Clinical  correlation with patient history and other diagnostic information is  necessary to determine patient infection status.  Positive results do  not rule out bacterial infection or co-infection with other viruses. If result is PRESUMPTIVE POSTIVE SARS-CoV-2 nucleic acids MAY BE PRESENT.   A presumptive positive result was obtained on the submitted specimen  and confirmed on repeat testing.  While 2019 novel coronavirus   (SARS-CoV-2) nucleic acids may be present in the submitted sample  additional confirmatory testing may be necessary for epidemiological  and / or clinical management purposes  to differentiate between  SARS-CoV-2 and other Sarbecovirus currently known to infect humans.  If clinically indicated additional testing with an alternate test  methodology [phone]) is advised. The SARS-CoV-2 RNA is generally  detectable in upper and lower respiratory sp ecimens during the acute  phase of infection. The expected result is Negative. Fact Sheet for Patients:  StrictlyIdeas.no Fact Sheet for Healthcare Providers: BankingDealers.co.za This test is not yet approved or cleared by the [place] FDA and has been authorized for detection and/or diagnosis of SARS-CoV-2 by FDA under an Emergency Use Authorization (EUA).  This EUA will remain in effect (meaning this test can be used) for the duration of the COVID-19 declaration under Section 564(b)(1) of the Act, 21 U.S.C. section 360bbb-3(b)(1), unless the authorization is terminated or revoked sooner. Performed at [facility], [address] [place], [place] [address]   MRSA PCR Screening     Status: None   Collection Time: 03/09/19  2:48 AM  Result Value Ref Range Status   MRSA by PCR NEGATIVE NEGATIVE Final  Comment:        The GeneXpert MRSA Assay (FDA approved for NASAL specimens only), is one component of a comprehensive MRSA colonization surveillance program. It is not intended to diagnose MRSA infection nor to guide or monitor treatment for MRSA infections. Performed at [facility], [address] [place], [place] [address]   Culture, respiratory (non-expectorated)     Status: None   Collection Time: 03/09/19  8:00 AM  Result Value Ref Range Status   Specimen Description   Final    TRACHEAL ASPIRATE Performed at [facility], [address],  [place], [place] [address]    Special Requests   Final    NONE Performed at [facility], [address] [place], [place] [address]    Gram Stain   Final    ABUNDANT WBC PRESENT,BOTH PMN AND MONONUCLEAR MODERATE GRAM POSITIVE COCCI RARE YEAST    Culture   Final    MODERATE GROUP B STREP(S.AGALACTIAE)ISOLATED TESTING AGAINST S. AGALACTIAE NOT ROUTINELY PERFORMED DUE TO PREDICTABILITY OF AMP/PEN/VAN SUSCEPTIBILITY. Performed at [facility], [place] [address], [place], [place] [address]    Report Status 03/11/2019 FINAL  Final  CULTURE, BLOOD (ROUTINE X 2) w Reflex to ID Panel     Status: None   Collection Time: 03/09/19  2:53 PM  Result Value Ref Range Status   Specimen Description   Final    BLOOD BLOOD RIGHT ARM Performed at [facility], [address] [place], [place] [address]    Special Requests   Final    BOTTLES DRAWN AEROBIC AND ANAEROBIC Blood Culture results may not be optimal due to an excessive volume of blood received in culture bottles Performed at [facility], [address] [place], [place] [address]    Culture   Final    NO GROWTH 5 DAYS Performed at [facility], [place] [address] [place], [place] [address]    Report Status 03/17/2019 FINAL  Final  CULTURE, BLOOD (ROUTINE X 2) w Reflex to ID Panel     Status: None   Collection Time: 03/09/19  5:00 PM  Result Value Ref Range Status   Specimen Description   Final    BLOOD RIGHT ARM Performed at [facility], [address] [place], [place] [address]    Special Requests   Final    BOTTLES DRAWN AEROBIC AND ANAEROBIC Blood Culture results may not be optimal due to an excessive volume of blood received in culture bottles Performed at [facility], [address], [place], [place] [address]    Culture   Final    NO GROWTH 5 DAYS Performed at [facility], [place] [address], [place], [place] [address]    Report Status 03/15/2019 FINAL  Final  Urine  Culture     Status: None   Collection Time: 03/11/19  4:33 AM  Result Value Ref Range Status   Specimen Description   Final    URINE, RANDOM Performed at [facility], [address] [place], [place] [address]    Special Requests   Final    NONE Performed at [facility], [address], [place], [place] [address]    Culture   Final    NO GROWTH Performed at [facility], [place] [address], [place],  [address]    Report Status 03/12/2019 FINAL  Final    Best Practice/Protocols:  VTE Prophylaxis: Heparin (SQ)   Events: 5/31  admitted for severe shock and resp failure DRUG OD 6/1 started CRRT multiorgan failure, resp failure, vasopressors 6/2 severe hypoxia UF rates increased to 500 6/3 DNR, multiorgan failure 6/4 CODE STATUS changed to full code,high risk for reintubation severe hypoxia on CRRT 6/5 high flow [place], on CRRT, high risk for intubation 6/7 resp failure resolved, off CRRT, transfer to gen med floor 6/9 readmitted to ICU due to respiratory distress, abdominal distention due to ileus.  Studies: Ct Abdomen Pelvis Wo Contrast  Result Date: 03/17/2019 CLINICAL DATA:  Overdose, abdominal distension EXAM: CT ABDOMEN AND PELVIS WITHOUT CONTRAST TECHNIQUE: Multidetector CT imaging of the abdomen and pelvis was performed following the standard protocol without IV contrast. COMPARISON:  04/27/2007 FINDINGS: Lower chest: Small bilateral pleural effusions and associated atelectasis or consolidation. Hepatobiliary: No focal liver abnormality is seen. Status post cholecystectomy. No biliary dilatation. Pancreas: Unremarkable. No pancreatic ductal dilatation or surrounding inflammatory changes. Spleen: Normal in size without significant abnormality. Adrenals/Urinary Tract: Adrenal glands are unremarkable. Kidneys are normal, without renal calculi, solid lesion, or hydronephrosis. Foley catheter in the bladder Stomach/Bowel: Appendix is surgically absent. The  stomach, small bowel, and colon are diffusely distended by air and fluid, with fluid present to the rectum. Vascular/Lymphatic: Right femoral central venous catheter. No enlarged abdominal or pelvic lymph nodes. Reproductive: No mass or other significant abnormality. Other: No abdominal wall hernia or abnormality. Small volume ascites. Anasarca. Musculoskeletal: No acute or significant osseous findings. IMPRESSION: 1. The stomach, small bowel, and colon are diffusely distended by air and fluid, with fluid present to the rectum. Findings are consistent with ileus. 2.  Pleural effusions, ascites, and anasarca. 3.  Status post cholecystectomy and appendectomy. Electronically Signed   By: Eddie Candle M.D.   On: 03/17/2019 08:35   Dg Abd 1 View  Result Date: 03/18/2019 CLINICAL DATA:  Follow-up ileus EXAM: ABDOMEN - 1 VIEW COMPARISON:  Film from the previous day. FINDINGS: Gastric catheter is noted coiled within the stomach. There remains scattered large and small bowel gas. Some progression and small-bowel dilatation is noted when compare with the previous day. Right femoral central line is seen. No bony abnormality is noted. IMPRESSION: Gaseous distension of the large and small bowel with some small bowel dilatation. This would be consistent with a generalized ileus. Correlation with the physical exam is recommended. Electronically Signed   By: Inez Catalina M.D.   On: 03/18/2019 07:31   Dg Abd 1 View  Result Date: 03/17/2019 CLINICAL DATA:  Check gastric catheter placement EXAM: ABDOMEN - 1 VIEW COMPARISON:  None. FINDINGS: Gastric catheter is noted coiled within the stomach. Scattered large and small bowel gas is seen similar to that seen on prior CT examination. No free air is noted. IMPRESSION: Gastric catheter coiled within the stomach. Electronically Signed   By: Inez Catalina M.D.   On: 03/17/2019 10:09   Dg Abd 1 View  Result Date: 03/14/2019 CLINICAL DATA:  28 year old male with history of abdominal  pain. EXAM: ABDOMEN - 1 VIEW COMPARISON:  03/09/2019. FINDINGS: Right femoral central venous catheter with tip projecting over the expected location of the proximal right common femoral vein. Gas and stool are seen scattered throughout the colon extending to the level of the distal rectum. No pathologic distension of small bowel is noted. Several nondilated gas-filled loops of small bowel or noted projecting over the central abdomen. No gross evidence of pneumoperitoneum. IMPRESSION: 1. Nonspecific, nonobstructive bowel gas pattern, as above. 2. No pneumoperitoneum. Electronically Signed   By: Quillian Quince  Entrikin M.D.   On: 03/14/2019 22:34   Dg Abd 1 View  Result Date: 03/09/2019 CLINICAL DATA:  28 year old male presenting after drug overdose. EXAM: ABDOMEN - 1 VIEW COMPARISON:  Portable chest today. CT Abdomen and Pelvis 05/02/2007. FINDINGS: Semi upright AP view at 0510 hours. Enteric tube side hole is at the level of the gastric body. Nonobstructed bowel-gas pattern. No osseous abnormality identified. IMPRESSION: Enteric tube side hole at the level of the gastric body. Normal visible bowel gas pattern. Electronically Signed   By: Genevie Ann M.D.   On: 03/09/2019 05:46   Ct Head Wo Contrast  Result Date: 03/13/2019 CLINICAL DATA:  Drug overdose EXAM: CT HEAD WITHOUT CONTRAST TECHNIQUE: Contiguous axial images were obtained from the base of the skull through the vertex without intravenous contrast. COMPARISON:  Head CT 12/29/2006 FINDINGS: Brain: There is no mass, hemorrhage or extra-axial collection. The size and configuration of the ventricles and extra-axial CSF spaces are normal. The brain parenchyma is normal, without acute or chronic infarction. Vascular: No abnormal hyperdensity of the major intracranial arteries or dural venous sinuses. No intracranial atherosclerosis. Skull: The visualized skull base, calvarium and extracranial soft tissues are normal. Sinuses/Orbits: No fluid levels or advanced mucosal  thickening of the visualized paranasal sinuses. No mastoid or middle ear effusion. The orbits are normal. IMPRESSION: Normal head CT. Electronically Signed   By: Ulyses Jarred M.D.   On: 03/13/2019 02:30   Nm Pulmonary Perf And Vent  Result Date: 03/16/2019 CLINICAL DATA:  Shortness of breath EXAM: NUCLEAR MEDICINE VENTILATION - PERFUSION LUNG SCAN VIEWS: Anterior and posterior: Ventilation and perfusion. Patient was unable to tolerate additional imaging. RADIOPHARMACEUTICALS:  32.1 mCi of Tc-30m DTPA aerosol inhalation and 4.38 mCi Tc66m MAA IV COMPARISON:  Chest radiograph March 15, 2017 FINDINGS: Ventilation: Radiotracer uptake is homogeneous and symmetric bilaterally. No evident ventilation defects. Perfusion: Radiotracer uptake is homogeneous and symmetric bilaterally. No perfusion defects evident. IMPRESSION: No ventilation or perfusion defects. Very low probability of pulmonary embolus. Electronically Signed   By: Lowella Grip III M.D.   On: 03/16/2019 13:22   Dg Chest Port 1 View  Result Date: 03/18/2019 CLINICAL DATA:  Acute respiratory failure EXAM: PORTABLE CHEST 1 VIEW COMPARISON:  03/17/2019 FINDINGS: Cardiac shadow is stable. Left jugular central line is again seen and stable. Gastric catheter is noted coiled within the stomach. Patchy infiltrates are noted bilaterally stable from the prior exam. No new focal abnormality is noted. IMPRESSION: Stable bilateral infiltrates. Electronically Signed   By: Inez Catalina M.D.   On: 03/18/2019 07:30   Dg Chest Port 1 View  Result Date: 03/17/2019 CLINICAL DATA:  Respiratory failure, distress EXAM: PORTABLE CHEST 1 VIEW COMPARISON:  03/16/2019 FINDINGS: Left central line is unchanged. NG tube placement into the stomach. Bilateral interstitial and airspace opacities are similar prior study. Mild cardiomegaly. Small bilateral effusions. No acute bony abnormality. IMPRESSION: Diffuse interstitial and airspace opacities are similar prior study. Small  effusions. Electronically Signed   By: Rolm Baptise M.D.   On: 03/17/2019 10:08   Dg Chest Port 1 View  Result Date: 03/16/2019 CLINICAL DATA:  Chest pain. EXAM: PORTABLE CHEST 1 VIEW COMPARISON:  One-view chest x-ray 03/13/2019 FINDINGS: A left IJ line is stable. Heart size is exaggerated by low lung volumes. Patchy bilateral interstitial and airspace opacities have increased. Airspace disease is asymmetric on the left. No definite effusions are present. Visualized soft tissues and bony thorax are unremarkable. IMPRESSION: 1. Increasing interstitial and airspace opacities, left greater  than right. Findings are compatible with ARDS or infection. 2. Stable left IJ line. Electronically Signed   By: San Morelle M.D.   On: 03/16/2019 04:57   Dg Chest Port 1 View  Result Date: 03/13/2019 CLINICAL DATA:  Acute respiratory failure EXAM: PORTABLE CHEST 1 VIEW COMPARISON:  03/11/2019 FINDINGS: The left-sided catheter tip projects over the SVC. The heart size is stable from prior study. There is worsening vascular congestion in worsening bilateral airspace opacities. There are likely bilateral pleural effusions. No evidence of a pneumothorax. The patient has been extubated. Enteric tube has been removed. IMPRESSION: 1. Status post extubation. 2. Remaining lines and tubes as above. 3. Worsening vascular congestion. Persistent small bilateral pleural effusions. Electronically Signed   By: Constance Holster M.D.   On: 03/13/2019 03:46   Dg Chest Port 1 View  Result Date: 03/11/2019 CLINICAL DATA:  Acute respiratory failure EXAM: PORTABLE CHEST 1 VIEW COMPARISON:  03/10/2019 FINDINGS: Cardiac shadow is stable. Endotracheal tube, gastric catheter and left jugular central line are again seen and stable. Increasing vascular congestion is noted when compare with the prior exam with enlarging left pleural effusion. No focal confluent infiltrate is seen. IMPRESSION: Worsening vascular congestion with left-sided  pleural effusion. Tubes and lines as described. Electronically Signed   By: Inez Catalina M.D.   On: 03/11/2019 07:07   Dg Chest Port 1 View  Result Date: 03/10/2019 CLINICAL DATA:  Hypoxia.  Patient on a ventilator. EXAM: PORTABLE CHEST 1 VIEW COMPARISON:  Earlier film, same date. FINDINGS: The endotracheal tube is in good position, 5 cm above the carina. The NG tube is coursing down the esophagus and into the stomach. The left IJ central venous catheter is stable. The lungs demonstrate improved aeration compared to the earlier film. Less edema and atelectasis. No pneumothorax. IMPRESSION: 1. Support apparatus in good position without complicating features. 2. Improved lung aeration since earlier chest film. Electronically Signed   By: Marijo Sanes M.D.   On: 03/10/2019 20:35   Portable Chest Xray  Result Date: 03/10/2019 CLINICAL DATA:  Respiratory failure EXAM: PORTABLE CHEST 1 VIEW COMPARISON:  03/09/2019 FINDINGS: Endotracheal tube tip at the clavicular heads. The orogastric tube at least reaches the stomach. Left IJ dialysis catheter tip at the upper cavoatrial junction. Worsening aeration with obscured left diaphragm. There is diffuse hazy lung opacity. Suspect layering pleural effusions. Cardiomegaly and vascular pedicle widening. IMPRESSION: Stable, unremarkable hardware positioning. Worsening aeration, likely atelectasis, layering fluid, and edema. Electronically Signed   By: Monte Fantasia M.D.   On: 03/10/2019 06:22   Dg Chest Port 1 View  Result Date: 03/09/2019 CLINICAL DATA:  Central line placement EXAM: PORTABLE CHEST 1 VIEW COMPARISON:  03/09/2019 FINDINGS: The endotracheal tube terminates above the carina. The newly placed left-sided central venous catheter tip projects over the SVC/distal left brachiocephalic vein. The enteric tube extends below the left hemidiaphragm. There is no left-sided pneumothorax. The heart size is stable. No right-sided pneumothorax. Coarsened lung markings are  again identified bilaterally. IMPRESSION: 1. Newly placed left-sided central venous catheter as above with no evidence of a pneumothorax. 2. Remaining lines and tubes as above. 3. Stable cardiac size. Persistent coarse airspace opacities bilaterally similar to prior study. Electronically Signed   By: Constance Holster M.D.   On: 03/09/2019 19:58   Dg Chest Port 1 View  Result Date: 03/09/2019 CLINICAL DATA:  28 year old male presenting after drug overdose. EXAM: PORTABLE CHEST 1 VIEW COMPARISON:  02/09/2019 and earlier. FINDINGS: Portable AP semi upright  view at 0510 hours. Intubated, endotracheal tube tip at the level the clavicles. Enteric tube courses to the abdomen, tip not included. Normal cardiac size and mediastinal contours. Patchy and confluent perihilar and lung base opacity. No superimposed pneumothorax. No overt edema. No pleural effusion identified. Paucity of bowel gas in the upper abdomen. No osseous abnormality identified. IMPRESSION: 1. Endotracheal tube tip at the level the clavicles. Enteric tube courses to the abdomen, tip not included. 2. Patchy and confluent perihilar and lung base opacity. Consider aspiration. No pulmonary edema or pleural effusion. Electronically Signed   By: Genevie Ann M.D.   On: 03/09/2019 05:45    Consults: Treatment Team:  Murlean Iba, MD Lavella Hammock, MD [facility], [facility], MD   Subjective:    Overnight Issues: Abdominal distention better with NG to intermittent suction.  More comfortable and less tachypneic.  Required some BiPAP last night.  Now off of BiPAP.  On high flow O2.  Objective:  Vital signs for last 24 hours: Temp:  [98.1 F (36.7 C)-102.5 F (39.2 C)] 101.3 F (38.5 C) (06/10 2217) Pulse Rate:  [84-115] 114 (06/10 1947) Resp:  [12-56] 24 (06/10 1947) BP: (104-133)/(58-75) 125/71 (06/10 1800) SpO2:  [82 %-100 %] 82 % (06/10 1947) FiO2 (%):  [60 %-100 %] 100 % (06/10 1954) Weight:  [102.3 kg] 102.3 kg (06/10 0930)  Hemodynamic  parameters for last 24 hours:    Intake/Output from previous day: 06/09 0701 - 06/10 0700 In: 868.2 [I.V.:86.1; IV Piggyback:782.1] Out: 1260 [Urine:585; Emesis/NG output:675]  Intake/Output this shift: Total I/O In: 100 [Other:100] Out: -   Vent settings for last 24 hours: FiO2 (%):  [60 %-100 %] 100 %  Physical Exam:  General: Overweight male, no respiratory distress/not tachypneic  HEENT: PERRLA, trachea midline, no JVD,  Neuro:  Mildly anxious, no focal deficits Cardiovascular: Apical pulse regular, S1-S2, no murmur regurg or gallop, +2 pulses bilaterally, +2 anasarca  Lungs: Scattered rhonchi.  Some basilar atelectatic crackles.  No increased use of accessories day. Abdomen: Soft, less distended, diminished bowel sounds.  Musculoskeletal: No joint deformities, positive range of motion Skin: Warm and dry, no rash or lesions  Assessment/Plan:   1.  Acute respiratory distress: This is due to severe abdominal distention from ileus.  Patient is responding to conservative management with high flow O2, NG decompression, withholding narcotics.  Continue supportive care.  2.  Ileus: Replace electrolytes as needed.  NG tube placed and placed to intermittent wall suction.  Continue supportive care.  CT abdomen and pelvis showed only ileus, performed yesterday.  Discontinued narcotics as these can aggravate the issue.  Reglan 4 times a day, continue.  Monitor.Mobilize as soon as feasible.  3.  Acute renal failure: Nonoliguric.  Continue Foley catheter due to need for strict I&O's.  Follow renal function.  Hemodialysis as needed.  Avoid nephrotoxins.  4.  Group B strep pneumonia: Will have total of 7 days of antibiotics.  Currently on ceftriaxone.  5.  Nausea and vomiting: Resolved.     LOS: 9 days   Additional comments: Multidisciplinary rounds were performed with the ICU team.  Discussed with Dr. Fritzi Mandes.  Critical Care Total Time*:   C. Derrill Kay, MD [place]  PCCM 03/18/2019  *Care during the described time interval was provided by me and/or other providers on the critical care team.  I have reviewed this patient's available data, including medical history, events of note, physical examination and test results as part of my evaluation.

## 2019-03-18 NOTE — Progress Notes (Signed)
HD Tx Start   03/18/19 0930  Hand-Off documentation  Report given to (Full Name) Trellis Paganini RN  Report received from (Full Name) Wallace Cullens RN  Vital Signs  Temp 98.9 F (37.2 C)  Temp Source Oral  Pulse Rate (!) 102  Pulse Rate Source Monitor  Resp (!) 25  BP 116/70  BP Location Left Arm  BP Method Automatic  Patient Position (if appropriate) Lying  Oxygen Therapy  SpO2 (!) 85 %  O2 Device HFNC  Heater temperature 88 F (31.1 C)  O2 Flow Rate (L/min) 45 L/min  Pain Assessment  Pain Scale 0-10  Pain Score 0  Dialysis Weight  Weight 102.3 kg  Type of Weight Pre-Dialysis  Time-Out for Hemodialysis  What Procedure? Hemodialysis  Correct Site? Yes  Correct Side? Yes  Correct Procedure? Yes  Consents Verified? Yes  Rad Studies Available? N/A  Safety Precautions Reviewed? Yes  Engineer, civil (consulting) Number [id number]  Station Number  [id number])  UF/Alarm Test Passed  Conductivity: Meter 14  Conductivity: Machine  13.8  pH 7  Reverse Osmosis WRO1  Normal Saline Lot Number [id number]  Dialyzer Lot Number [id number]  Disposable Set Lot Number [id number]  Machine Temperature 98.6 F (37 C)  Musician and Audible Yes  Blood Lines Intact and Secured Yes  Pre Treatment Patient Checks  Vascular access used during treatment Catheter  Patient is receiving dialysis in a chair Yes  Hepatitis B Surface Antigen Results Negative  Date Hepatitis B Surface Antigen Drawn 03/17/19  Hepatitis B Surface Antibody  (<10)  Date Hepatitis B Surface Antibody Drawn 03/17/19  Hemodialysis Consent Verified Yes  Hemodialysis Standing Orders Initiated Yes  ECG (Telemetry) Monitor On Yes  Prime Ordered Normal Saline  Length of  DialysisTreatment -hour(s) 3.5 Hour(s)  Dialysis Treatment Comments Na 140  Dialyzer Elisio 17H NR  Dialysate 3K, 2.5 Ca  Dialysate Flow Ordered 400  Blood Flow Rate Ordered 800 mL/min  Ultrafiltration Goal 1.5 Liters  Dialysis Blood Pressure Support Ordered  Albumin  During Hemodialysis Assessment  Blood Flow Rate (mL/min) 400 mL/min  Arterial Pressure (mmHg) -210 mmHg  Venous Pressure (mmHg) 150 mmHg  Transmembrane Pressure (mmHg) 60 mmHg  Ultrafiltration Rate (mL/min) 860 mL/min  Dialysate Flow Rate (mL/min) 800 ml/min  Conductivity: Machine  14  HD Safety Checks Performed Yes  Dialysis Fluid Bolus Normal Saline  Bolus Amount (mL) 250 mL  Intra-Hemodialysis Comments Tx initiated (coaching pt on breathing,pt stable, resp called)  Education / Care Plan  Dialysis Education Provided Yes  Documented Education in Care Plan Yes  Hemodialysis Catheter Left Internal jugular Double-lumen  Placement Date/Time: 03/09/19 2037   Placed prior to admission: No  Time Out: Correct patient;Correct site;Correct procedure  Maximum sterile barrier precautions: Sterile gown;Mask;Cap;Large sterile sheet;Sterile gloves;Hand hygiene  Site Prep: Chlorh...  Site Condition No complications  Blue Lumen Status Capped (Central line)  Red Lumen Status Capped (Central line)  Purple Lumen Status N/A  Catheter fill solution Heparin 1000 units/ml  Dressing Type Biopatch;Occlusive  Dressing Status Clean;Dry  Interventions Dressing reinforced  Drainage Description None

## 2019-03-18 NOTE — Progress Notes (Signed)
[facility] NAME: Glenn Rasmussen    MR#:  [id number]  DATE OF BIRTH:  07/10/1991  SUBJECTIVE:    Currently on high flow nasal cannula oxygen. Hemodialysis today. Sitter in the room.  REVIEW OF SYSTEMS:  Review of Systems  Constitutional: Negative for chills, fever and weight loss.  HENT: Negative for ear discharge, ear pain and nosebleeds.   Eyes: Negative for blurred vision, pain and discharge.  Respiratory: Positive for shortness of breath. Negative for sputum production, wheezing and stridor.   Cardiovascular: Negative for palpitations, orthopnea and PND.  Gastrointestinal: Negative for abdominal pain, diarrhea, nausea and vomiting.  Genitourinary: Negative for frequency and urgency.  Musculoskeletal: Negative for back pain and joint pain.  Neurological: Positive for weakness. Negative for sensory change, speech change and focal weakness.  Psychiatric/Behavioral: Negative for depression and hallucinations. The patient is nervous/anxious.     DRUG ALLERGIES:  No Known Allergies  VITALS:  Blood pressure 119/65, pulse 90, temperature 98.9 F (37.2 C), temperature source Oral, resp. rate (!) 22, height 5\' 7"  (1.702 m), weight 102.3 kg, SpO2 100 %.  PHYSICAL EXAMINATION:  Physical Exam   GENERAL:  28 y.o.-year-old critically ill-appearing patient lying in the bed with mild acute distress. obese EYES: Pupils equal, round, reactive to light and accommodation. No scleral icterus. Extraocular muscles intact. HFNC+ HEENT: Head atraumatic, normocephalic. Oropharynx and nasopharynx clear. NG+ NECK:  Supple, no jugular venous distention. No thyroid enlargement, no tenderness.   LUNGS: distant breath sounds bilaterally, no wheezing, ++rales,norhonchi or crepitation. Mild use of accessory muscles of respiration.  Decreased bibasilar breath sounds CARDIOVASCULAR: S1, S2 normal. No murmurs, rubs, or gallops.  ABDOMEN: Soft, nontender,  nondistended. Bowel sounds present. No organomegaly or mass.  EXTREMITIES: No  cyanosis, or clubbing.  1+ pedal edema noted NEUROLOGIC: Patient he is alert but remains oriented x2.   PSYCHIATRIC: The patient is alert and oriented x2 anxious SKIN: No obvious rash, lesion, or ulcer.    LABORATORY PANEL:   CBC Recent Labs  Lab 03/18/19 0350  WBC 14.2*  HGB 7.2*  HCT 21.7*  PLT 323   ------------------------------------------------------------------------------------------------------------------  Chemistries  Recent Labs  Lab 03/17/19 0536 03/18/19 0350  NA 137 140  K 4.2 4.0  CL 99 102  CO2 19* 21*  GLUCOSE 132* 122*  BUN 76* 99*  CREATININE 5.96* 7.62*  CALCIUM 8.5* 8.0*  MG 2.6* 2.8*  AST 58*  --   ALT 50*  --   ALKPHOS 53  --   BILITOT 0.8  --    ------------------------------------------------------------------------------------------------------------------  Cardiac Enzymes Recent Labs  Lab 03/16/19 1634  TROPONINI 0.26*   ------------------------------------------------------------------------------------------------------------------  RADIOLOGY:  Ct Abdomen Pelvis Wo Contrast  Result Date: 03/17/2019 CLINICAL DATA:  Overdose, abdominal distension EXAM: CT ABDOMEN AND PELVIS WITHOUT CONTRAST TECHNIQUE: Multidetector CT imaging of the abdomen and pelvis was performed following the standard protocol without IV contrast. COMPARISON:  04/27/2007 FINDINGS: Lower chest: Small bilateral pleural effusions and associated atelectasis or consolidation. Hepatobiliary: No focal liver abnormality is seen. Status post cholecystectomy. No biliary dilatation. Pancreas: Unremarkable. No pancreatic ductal dilatation or surrounding inflammatory changes. Spleen: Normal in size without significant abnormality. Adrenals/Urinary Tract: Adrenal glands are unremarkable. Kidneys are normal, without renal calculi, solid lesion, or hydronephrosis. Foley catheter in the bladder  Stomach/Bowel: Appendix is surgically absent. The stomach, small bowel, and colon are diffusely distended by air and fluid, with fluid present to the rectum. Vascular/Lymphatic: Right femoral central venous  catheter. No enlarged abdominal or pelvic lymph nodes. Reproductive: No mass or other significant abnormality. Other: No abdominal wall hernia or abnormality. Small volume ascites. Anasarca. Musculoskeletal: No acute or significant osseous findings. IMPRESSION: 1. The stomach, small bowel, and colon are diffusely distended by air and fluid, with fluid present to the rectum. Findings are consistent with ileus. 2.  Pleural effusions, ascites, and anasarca. 3.  Status post cholecystectomy and appendectomy. Electronically Signed   By: Eddie Candle M.D.   On: 03/17/2019 08:35   Dg Abd 1 View  Result Date: 03/18/2019 CLINICAL DATA:  Follow-up ileus EXAM: ABDOMEN - 1 VIEW COMPARISON:  Film from the previous day. FINDINGS: Gastric catheter is noted coiled within the stomach. There remains scattered large and small bowel gas. Some progression and small-bowel dilatation is noted when compare with the previous day. Right femoral central line is seen. No bony abnormality is noted. IMPRESSION: Gaseous distension of the large and small bowel with some small bowel dilatation. This would be consistent with a generalized ileus. Correlation with the physical exam is recommended. Electronically Signed   By: Inez Catalina M.D.   On: 03/18/2019 07:31   Dg Abd 1 View  Result Date: 03/17/2019 CLINICAL DATA:  Check gastric catheter placement EXAM: ABDOMEN - 1 VIEW COMPARISON:  None. FINDINGS: Gastric catheter is noted coiled within the stomach. Scattered large and small bowel gas is seen similar to that seen on prior CT examination. No free air is noted. IMPRESSION: Gastric catheter coiled within the stomach. Electronically Signed   By: Inez Catalina M.D.   On: 03/17/2019 10:09   Dg Chest Port 1 View  Result Date:  03/18/2019 CLINICAL DATA:  Acute respiratory failure EXAM: PORTABLE CHEST 1 VIEW COMPARISON:  03/17/2019 FINDINGS: Cardiac shadow is stable. Left jugular central line is again seen and stable. Gastric catheter is noted coiled within the stomach. Patchy infiltrates are noted bilaterally stable from the prior exam. No new focal abnormality is noted. IMPRESSION: Stable bilateral infiltrates. Electronically Signed   By: Inez Catalina M.D.   On: 03/18/2019 07:30   Dg Chest Port 1 View  Result Date: 03/17/2019 CLINICAL DATA:  Respiratory failure, distress EXAM: PORTABLE CHEST 1 VIEW COMPARISON:  03/16/2019 FINDINGS: Left central line is unchanged. NG tube placement into the stomach. Bilateral interstitial and airspace opacities are similar prior study. Mild cardiomegaly. Small bilateral effusions. No acute bony abnormality. IMPRESSION: Diffuse interstitial and airspace opacities are similar prior study. Small effusions. Electronically Signed   By: Rolm Baptise M.D.   On: 03/17/2019 10:08    EKG:   Orders placed or performed during the hospital encounter of 03/08/19   ED EKG   ED EKG   ED EKG   ED EKG   EKG 12-Lead   EKG 12-Lead   EKG 12-Lead   EKG 12-Lead   EKG 12-Lead   EKG 12-Lead   EKG 12-Lead   EKG 12-Lead   EKG 12-Lead   EKG 12-Lead   EKG 12-Lead   EKG 12-Lead   EKG 12-Lead   EKG 12-Lead   EKG - 12 lead   EKG - 12 lead   EKG 12-Lead   EKG 12-Lead    ASSESSMENT AND PLAN:   28 year old male with past medical history significant for hypertension, IBS admitted secondary to overdose on Phenergan, losartan, Norvasc and Klonopin  1.  Acute Hypoxic respiratory failure due to volume overload/?ARDS -Patient is extubated on 03/12/2019 -currently on high flow nasal cannula oxygen. Patient went into respiratory  distress transfer to ICU. -sats 93-94% -ABG stat--shows resp alkalosis/with hypoxia -EKG--sinus tachycardia -VQ scan low probability for PE. D/ced heparin  drip.  2.Intentional drug overdose -patient overdosed on 38 tablets of Phenergan, 19 tablets of losartan, 42 tablets of Norvasc and 78 tablets of Klonopin -Urine tox positive for benzos and tricyclics -Appreciate psych consult.  Once patient is medically stable, will need inpatient psychiatry admission.  3.  Acute renal failure-likely ATN from hypotension.  -received on CRRT, metabolic acidosis.  Appreciate nephrology consult - CRRTon hold for now-- now on hemodialysis. -decision for dialysis on daily basis.  4.   Pneumonia-follow-up procalcitonin and WBC. - Currently on Iv unasyn -wbc 21K--18K  5.  DVT prophylaxis- on heparin SQ  6.  GERD-Protonix  7.  Elevated troponin-likely demand ischemia, troponin plateaued vs due to renal failure -  Most recent echo this admission showing normal LV function and no wall motion abnormalities.  8.Anemia appears due to current illness with renal failure No active bleed   D/w dr Duwayne Heck  CODE STATUS: Full code  TOTAL TIME TAKING CARE OF THIS PATIENT: 35 minutes.   POSSIBLE D/C IN ? DAYS, DEPENDING ON CLINICAL CONDITION.   Fritzi Mandes M.D on 03/18/2019 at 3:19 PM  Between 7am to 6pm - Pager - [phone]  After 6pm go to [email or web address] - password [email or web address]  Office  [phone]  CC: Primary care physician; Maeola Sarah, MD

## 2019-03-18 NOTE — Progress Notes (Signed)
[facility], [place] 03/18/19  Subjective:  Urine output was only 585 cc over the preceding 24 hours. Still on high flow nasal cannula. Renal parameters worse. Patient underwent dialysis today.    Objective:  Vital signs in last 24 hours:  Temp:  [97.8 F (36.6 C)-98.9 F (37.2 C)] 98.9 F (37.2 C) (06/10 1300) Pulse Rate:  [84-104] 90 (06/10 1315) Resp:  [20-45] 22 (06/10 1315) BP: (104-126)/(54-74) 119/65 (06/10 1315) SpO2:  [85 %-100 %] 100 % (06/10 1315) FiO2 (%):  [60 %-100 %] 83 % (06/10 1020) Weight:  [102.3 kg] 102.3 kg (06/10 0930)  Weight change: -5.7 kg Filed Weights   03/17/19 0314 03/18/19 0500 03/18/19 0930  Weight: 106.6 kg 102.3 kg 102.3 kg    Intake/Output:    Intake/Output Summary (Last 24 hours) at 03/18/2019 1406 Last data filed at 03/18/2019 1300 Gross per 24 hour  Intake 894.85 ml  Output 3419 ml  Net -2524.15 ml   General :        No acute distress Head:              Normocephalic, atraumatic Eyes/ENT:      Moist oral mucus membranes Neck:              Supple Lungs:            Scattered rhonchi, on HFNC Heart:             S1S2 no rubs Abdomen:      Soft, non-tender Extremities:   no cyanosis, + dependent edema Skin:               Skin color, texture, turgor normal, no rashes or lesions Neurologic:    alert, able to follow commands IJ vascath  Basic Metabolic Panel:  Recent Labs  Lab 03/14/19 0430  03/14/19 1602 03/15/19 0434 03/16/19 0341 03/16/19 1438 03/17/19 0536 03/18/19 0350 03/18/19 1209  NA 138   < > 137 138 134*  --  137 140  --   K 3.2*   < > 3.1* 3.2* 3.4*  --  4.2 4.0  --   CL 104   < > 103 104 99  --  99 102  --   CO2 24   < > 24 23 18*  --  19* 21*  --   GLUCOSE 100*   < > 108* 109* 111*  --  132* 122*  --   BUN 41*   < > 37* 48* 74*  --  76* 99*  --   CREATININE 3.05*   < > 3.02* 4.07* 6.08*  --  5.96* 7.62*  --   CALCIUM 8.2*   < > 8.6* 8.3* 8.5*  --  8.5* 8.0*  --   MG 2.2  --   --   2.3 2.5*  --  2.6* 2.8*  --   PHOS  --    < > 2.3*  --   --  5.4* 5.3* 7.9* 5.5*   < > = values in this interval not displayed.     CBC: Recent Labs  Lab 03/14/19 0430 03/15/19 0434 03/16/19 0341 03/17/19 0536 03/18/19 0350  WBC 11.1* 13.7* 21.8* 18.1* 14.2*  NEUTROABS  --   --  16.2*  --   --   HGB 7.0* 7.1* 7.9* 7.9* 7.2*  HCT 20.2* 21.2* 23.3* 23.7* 21.7*  MCV 78.3* 80.0 80.6 80.6 82.2  PLT 154 200 327 364 323      Lab  Results  Component Value Date   HEPBSAG Negative 03/17/2019   HEPBIGM Negative 03/17/2019      Microbiology:  Recent Results (from the past 240 hour(s))  SARS Coronavirus 2 (CEPHEID - Performed in [place] hospital lab), Hosp Order     Status: None   Collection Time: 03/08/19 11:48 PM  Result Value Ref Range Status   SARS Coronavirus 2 NEGATIVE NEGATIVE Final    Comment: (NOTE) If result is NEGATIVE SARS-CoV-2 target nucleic acids are NOT DETECTED. The SARS-CoV-2 RNA is generally detectable in upper and lower  respiratory specimens during the acute phase of infection. The lowest  concentration of SARS-CoV-2 viral copies this assay can detect is 250  copies / mL. A negative result does not preclude SARS-CoV-2 infection  and should not be used as the sole basis for treatment or other  patient management decisions.  A negative result may occur with  improper specimen collection / handling, submission of specimen other  than nasopharyngeal swab, presence of viral mutation(s) within the  areas targeted by this assay, and inadequate number of viral copies  (<250 copies / mL). A negative result must be combined with clinical  observations, patient history, and epidemiological information. If result is POSITIVE SARS-CoV-2 target nucleic acids are DETECTED. The SARS-CoV-2 RNA is generally detectable in upper and lower  respiratory specimens dur ing the acute phase of infection.  Positive  results are indicative of active infection with SARS-CoV-2.   Clinical  correlation with patient history and other diagnostic information is  necessary to determine patient infection status.  Positive results do  not rule out bacterial infection or co-infection with other viruses. If result is PRESUMPTIVE POSTIVE SARS-CoV-2 nucleic acids MAY BE PRESENT.   A presumptive positive result was obtained on the submitted specimen  and confirmed on repeat testing.  While 2019 novel coronavirus  (SARS-CoV-2) nucleic acids may be present in the submitted sample  additional confirmatory testing may be necessary for epidemiological  and / or clinical management purposes  to differentiate between  SARS-CoV-2 and other Sarbecovirus currently known to infect humans.  If clinically indicated additional testing with an alternate test  methodology [phone]) is advised. The SARS-CoV-2 RNA is generally  detectable in upper and lower respiratory sp ecimens during the acute  phase of infection. The expected result is Negative. Fact Sheet for Patients:  StrictlyIdeas.no Fact Sheet for Healthcare Providers: BankingDealers.co.za This test is not yet approved or cleared by the [place] FDA and has been authorized for detection and/or diagnosis of SARS-CoV-2 by FDA under an Emergency Use Authorization (EUA).  This EUA will remain in effect (meaning this test can be used) for the duration of the COVID-19 declaration under Section 564(b)(1) of the Act, 21 U.S.C. section 360bbb-3(b)(1), unless the authorization is terminated or revoked sooner. Performed at [facility], [address] [place], [place] [address]   MRSA PCR Screening     Status: None   Collection Time: 03/09/19  2:48 AM  Result Value Ref Range Status   MRSA by PCR NEGATIVE NEGATIVE Final    Comment:        The GeneXpert MRSA Assay (FDA approved for NASAL specimens only), is one component of a comprehensive MRSA colonization surveillance  program. It is not intended to diagnose MRSA infection nor to guide or monitor treatment for MRSA infections. Performed at [facility], [address], [place], [place] [address]   Culture, respiratory (non-expectorated)     Status: None  Collection Time: 03/09/19  8:00 AM  Result Value Ref Range Status   Specimen Description   Final    TRACHEAL ASPIRATE Performed at [facility], [address], [place], [place] [address]    Special Requests   Final    NONE Performed at [facility], [address] [place], [place] [address]    Gram Stain   Final    ABUNDANT WBC PRESENT,BOTH PMN AND MONONUCLEAR MODERATE GRAM POSITIVE COCCI RARE YEAST    Culture   Final    MODERATE GROUP B STREP(S.AGALACTIAE)ISOLATED TESTING AGAINST S. AGALACTIAE NOT ROUTINELY PERFORMED DUE TO PREDICTABILITY OF AMP/PEN/VAN SUSCEPTIBILITY. Performed at [facility], [place] [address] [place], [place] [address]    Report Status 03/11/2019 FINAL  Final  CULTURE, BLOOD (ROUTINE X 2) w Reflex to ID Panel     Status: None   Collection Time: 03/09/19  2:53 PM  Result Value Ref Range Status   Specimen Description   Final    BLOOD BLOOD RIGHT ARM Performed at [facility], [address] [place], [place] [address]    Special Requests   Final    BOTTLES DRAWN AEROBIC AND ANAEROBIC Blood Culture results may not be optimal due to an excessive volume of blood received in culture bottles Performed at [facility], [address] [place], [place] [address]    Culture   Final    NO GROWTH 5 DAYS Performed at [facility], [place] [address] [place], [place] [address]    Report Status 03/17/2019 FINAL  Final  CULTURE, BLOOD (ROUTINE X 2) w Reflex to ID Panel     Status: None   Collection Time: 03/09/19  5:00 PM  Result Value Ref Range Status   Specimen Description   Final    BLOOD RIGHT ARM Performed at [facility], [address],  [place], [place] [address]    Special Requests   Final    BOTTLES DRAWN AEROBIC AND ANAEROBIC Blood Culture results may not be optimal due to an excessive volume of blood received in culture bottles Performed at [facility], [address] [place], [place] [address]    Culture   Final    NO GROWTH 5 DAYS Performed at [facility], [place] [address], [place], [place] [address]    Report Status 03/15/2019 FINAL  Final  Urine Culture     Status: None   Collection Time: 03/11/19  4:33 AM  Result Value Ref Range Status   Specimen Description   Final    URINE, RANDOM Performed at [facility], [address], [place], [place] [address]    Special Requests   Final    NONE Performed at [facility], [address] [place], [place] [address]    Culture   Final    NO GROWTH Performed at [facility], [place] [address], [place], [place] [address]    Report Status 03/12/2019 FINAL  Final    Coagulation Studies: Recent Labs    03/16/19 0414  LABPROT 14.0  INR 1.1    Urinalysis: No results for input(s): COLORURINE, LABSPEC, PHURINE, GLUCOSEU, HGBUR, BILIRUBINUR, KETONESUR, PROTEINUR, UROBILINOGEN, NITRITE, LEUKOCYTESUR in the last 72 hours.  Invalid input(s): APPERANCEUR    Imaging: Ct Abdomen Pelvis Wo Contrast  Result Date: 03/17/2019 CLINICAL DATA:  Overdose, abdominal distension EXAM: CT ABDOMEN AND PELVIS WITHOUT CONTRAST TECHNIQUE: Multidetector CT imaging of the abdomen and pelvis was performed following the  standard protocol without IV contrast. COMPARISON:  04/27/2007 FINDINGS: Lower chest: Small bilateral pleural effusions and associated atelectasis or consolidation. Hepatobiliary: No focal liver abnormality is seen. Status post cholecystectomy. No biliary dilatation. Pancreas: Unremarkable. No pancreatic ductal dilatation or surrounding inflammatory changes. Spleen: Normal in size without significant abnormality. Adrenals/Urinary Tract:  Adrenal glands are unremarkable. Kidneys are normal, without renal calculi, solid lesion, or hydronephrosis. Foley catheter in the bladder Stomach/Bowel: Appendix is surgically absent. The stomach, small bowel, and colon are diffusely distended by air and fluid, with fluid present to the rectum. Vascular/Lymphatic: Right femoral central venous catheter. No enlarged abdominal or pelvic lymph nodes. Reproductive: No mass or other significant abnormality. Other: No abdominal wall hernia or abnormality. Small volume ascites. Anasarca. Musculoskeletal: No acute or significant osseous findings. IMPRESSION: 1. The stomach, small bowel, and colon are diffusely distended by air and fluid, with fluid present to the rectum. Findings are consistent with ileus. 2.  Pleural effusions, ascites, and anasarca. 3.  Status post cholecystectomy and appendectomy. Electronically Signed   By: Eddie Candle M.D.   On: 03/17/2019 08:35   Dg Abd 1 View  Result Date: 03/18/2019 CLINICAL DATA:  Follow-up ileus EXAM: ABDOMEN - 1 VIEW COMPARISON:  Film from the previous day. FINDINGS: Gastric catheter is noted coiled within the stomach. There remains scattered large and small bowel gas. Some progression and small-bowel dilatation is noted when compare with the previous day. Right femoral central line is seen. No bony abnormality is noted. IMPRESSION: Gaseous distension of the large and small bowel with some small bowel dilatation. This would be consistent with a generalized ileus. Correlation with the physical exam is recommended. Electronically Signed   By: Inez Catalina M.D.   On: 03/18/2019 07:31   Dg Abd 1 View  Result Date: 03/17/2019 CLINICAL DATA:  Check gastric catheter placement EXAM: ABDOMEN - 1 VIEW COMPARISON:  None. FINDINGS: Gastric catheter is noted coiled within the stomach. Scattered large and small bowel gas is seen similar to that seen on prior CT examination. No free air is noted. IMPRESSION: Gastric catheter coiled  within the stomach. Electronically Signed   By: Inez Catalina M.D.   On: 03/17/2019 10:09   Dg Chest Port 1 View  Result Date: 03/18/2019 CLINICAL DATA:  Acute respiratory failure EXAM: PORTABLE CHEST 1 VIEW COMPARISON:  03/17/2019 FINDINGS: Cardiac shadow is stable. Left jugular central line is again seen and stable. Gastric catheter is noted coiled within the stomach. Patchy infiltrates are noted bilaterally stable from the prior exam. No new focal abnormality is noted. IMPRESSION: Stable bilateral infiltrates. Electronically Signed   By: Inez Catalina M.D.   On: 03/18/2019 07:30   Dg Chest Port 1 View  Result Date: 03/17/2019 CLINICAL DATA:  Respiratory failure, distress EXAM: PORTABLE CHEST 1 VIEW COMPARISON:  03/16/2019 FINDINGS: Left central line is unchanged. NG tube placement into the stomach. Bilateral interstitial and airspace opacities are similar prior study. Mild cardiomegaly. Small bilateral effusions. No acute bony abnormality. IMPRESSION: Diffuse interstitial and airspace opacities are similar prior study. Small effusions. Electronically Signed   By: Rolm Baptise M.D.   On: 03/17/2019 10:08     Medications:   . sodium chloride 10 mL/hr at 03/18/19 0858  . acetaminophen    . albumin human 12.5 g (03/18/19 1131)  . ampicillin-sulbactam (UNASYN) IV 3 g (03/18/19 1340)   . aspirin EC  325 mg Oral Daily  . Chlorhexidine Gluconate Cloth  6 each Topical Q0600  . furosemide  40  mg Intravenous Once  . heparin injection (subcutaneous)  5,000 Units Subcutaneous Q8H  . mouth rinse  15 mL Mouth Rinse q12n4p  . metoCLOPramide (REGLAN) injection  5 mg Intravenous Q6H  . pantoprazole sodium  40 mg Oral Daily  . [START ON 03/19/2019] sodium chloride flush  10-40 mL Intracatheter Q12H   sodium chloride, acetaminophen, bisacodyl, haloperidol lactate, ipratropium-albuterol, LORazepam, nitroGLYCERIN, senna, sodium chloride flush  Assessment/ Plan:  28 y.o.caucasian male with HTN , was admitted  on 03/08/2019 with ARF, acute resp failure secondary to drug overdose  1.  Oliguric acute renal failure with hyperkalemia (resolved) and volume overload  likely severe ATN CRRT started 6/1-> 6/6.  HD 6/8.  -Patient completed hemodialysis today.  Tolerated well.  Reassess for dialysis tomorrow.  2.  Acute respiratory failure Extubated 6/4 am Breathing improved postdialysis.  Continue high flow nasal cannula.  3. Metabolic acidosis -Improved with dialysis treatments.  Continue to monitor serum bicarbonate.  4. Drug overdose multiple tablets of Phenergan, losartan, amlodipine, clonidine IVC Sitter at bedside  5.  Hypokalemia Potassium 4.0 this a.m. and acceptable.     LOS: 9 Nancee Brownrigg 6/10/20202:06 PM  [employer], [place]  Note: This note was prepared with Dragon dictation. Any transcription errors are unintentional

## 2019-03-18 NOTE — Progress Notes (Deleted)
HD Tx Start   03/18/19 0930  Hand-Off documentation  Report given to (Full Name) Trellis Paganini RN  Report received from (Full Name) Wallace Cullens RN  Vital Signs  Temp 98.9 F (37.2 C)  Temp Source Oral  Pulse Rate (!) 102  Pulse Rate Source Monitor  Resp (!) 25  BP 116/70  BP Location Left Arm  BP Method Automatic  Patient Position (if appropriate) Lying  Oxygen Therapy  SpO2 (!) 85 %  O2 Device HFNC  Heater temperature 88 F (31.1 C)  O2 Flow Rate (L/min) 45 L/min  Pain Assessment  Pain Scale 0-10  Pain Score 0  Dialysis Weight  Weight 102.3 kg  Type of Weight Pre-Dialysis  Time-Out for Hemodialysis  What Procedure? Hemodialysis  Correct Site? Yes  Correct Side? Yes  Correct Procedure? Yes  Consents Verified? Yes  Rad Studies Available? N/A  Safety Precautions Reviewed? Yes  Engineer, civil (consulting) Number 5  Station Number  [id number])  UF/Alarm Test Passed  Conductivity: Meter 14  Conductivity: Machine  13.8  pH 7  Reverse Osmosis WRO1  Normal Saline Lot Number [id number]  Dialyzer Lot Number [id number]  Disposable Set Lot Number [id number]  Machine Temperature 98.6 F (37 C)  Musician and Audible Yes  Blood Lines Intact and Secured Yes  Pre Treatment Patient Checks  Vascular access used during treatment Catheter  Patient is receiving dialysis in a chair Yes  Hepatitis B Surface Antigen Results Negative  Date Hepatitis B Surface Antigen Drawn 03/17/19  Hepatitis B Surface Antibody  (<10)  Date Hepatitis B Surface Antibody Drawn 03/17/19  Hemodialysis Consent Verified Yes  Hemodialysis Standing Orders Initiated Yes  ECG (Telemetry) Monitor On Yes  Prime Ordered Normal Saline  Length of  DialysisTreatment -hour(s) 3.5 Hour(s)  Dialysis Treatment Comments Na 140  Dialyzer Elisio 17H NR  Dialysate 3K, 2.5 Ca  Dialysate Flow Ordered 400  Blood Flow Rate Ordered 800 mL/min  Ultrafiltration Goal 1.5 Liters  Dialysis Blood Pressure Support Ordered  Albumin  Education / Care Plan  Dialysis Education Provided Yes  Documented Education in Care Plan Yes  Hemodialysis Catheter Left Internal jugular Double-lumen  Placement Date/Time: 03/09/19 2037   Placed prior to admission: No  Time Out: Correct patient;Correct site;Correct procedure  Maximum sterile barrier precautions: Sterile gown;Mask;Cap;Large sterile sheet;Sterile gloves;Hand hygiene  Site Prep: Chlorh...  Site Condition No complications  Blue Lumen Status Capped (Central line)  Red Lumen Status Capped (Central line)  Purple Lumen Status N/A  Catheter fill solution Heparin 1000 units/ml  Dressing Type Biopatch;Occlusive  Dressing Status Clean;Dry  Interventions Dressing reinforced  Drainage Description None

## 2019-03-18 NOTE — Consult Note (Signed)
Pharmacy Antibiotic Note  Glenn Rasmussen is a 29 y.o. male admitted on 03/08/2019 with pneumonia.  Pharmacy has been consulted for Unasyn dosing for aspiration PNA.   Plan: Start Unasyn 3g IV every 12 hours for CrCl < 43ml/min  Height: 5\' 7"  (170.2 cm) Weight: 235 lb 0.2 oz (106.6 kg) IBW/kg (Calculated) : 66.1  Temp (24hrs), Avg:98.9 F (37.2 C), Min:97.8 F (36.6 C), Max:100.1 F (37.8 C)  Recent Labs  Lab 03/13/19 0441  03/14/19 0430  03/14/19 1225 03/14/19 1602 03/15/19 0434 03/16/19 0341 03/17/19 0536  WBC 17.1*  --  11.1*  --   --   --  13.7* 21.8* 18.1*  CREATININE 3.63*   < > 3.05*   < > 3.03* 3.02* 4.07* 6.08* 5.96*   < > = values in this interval not displayed.    Estimated Creatinine Clearance: 21.1 mL/min (A) (by C-G formula based on SCr of 5.96 mg/dL (H)).    No Known Allergies  Antimicrobials this admission: 6/1 Zosyn >> 6/3 6/2 Vancomycin>> 6/3 6/3 ceftriaxone >> 6/9 6/10 Unasyn>>   Microbiology results: 6/1 BCx: NG x 5 days  6/3 UCx: NG final  6/1 MRSA PCR: negative   Thank you for allowing pharmacy to be a part of this patient's care.  Pernell Dupre, PharmD, BCPS Clinical Pharmacist 03/18/2019 1:09 AM

## 2019-03-18 NOTE — Progress Notes (Signed)
Pre HD Assessment    03/18/19 0925  Neurological  Level of Consciousness Alert  Orientation Level Oriented to person;Oriented to place;Oriented to time;Disoriented to situation  Respiratory  Respiratory Pattern Tachypnea  Chest Assessment Chest expansion symmetrical  Bilateral Breath Sounds Diminished  Cardiac  Pulse Regular  Heart Sounds S1, S2  Cardiac Rhythm ST  Vascular  R Radial Pulse +2  L Radial Pulse +2  Edema Generalized;Right upper extremity;Left upper extremity;Right lower extremity;Left lower extremity  Generalized Edema +1  RUE Edema +1  LUE Edema +1  RLE Edema +1  LLE Edema +1  Integumentary  Integumentary (WDL) X  Skin Color Appropriate for ethnicity  Skin Condition Dry  Skin Integrity Other (Comment) (HD pt)  Musculoskeletal  Musculoskeletal (WDL) X  Generalized Weakness Yes  Gastrointestinal  Bowel Sounds Assessment Hypoactive;Faint  GU Assessment  Genitourinary (WDL) X  Genitourinary Symptoms Urinary Catheter;Other (Comment) (HD pt)  Urine Characteristics  Urine Color Yellow/straw  Urine Appearance Clear  Psychosocial  Psychosocial (WDL) X  Patient Behaviors Calm;Withdrawn  Emotional support given Given to patient

## 2019-03-18 NOTE — Consult Note (Signed)
Pharmacy Electrolyte Monitoring Consult:  Pharmacy consulted to assist in monitoring and replacing electrolytes in this      Labs:   Potassium (mmol/L)  Date Value  03/18/2019 4.0   Magnesium (mg/dL)  Date Value  03/18/2019 2.8 (H)   Phosphorus (mg/dL)  Date Value  03/18/2019 7.9 (H)   Calcium (mg/dL)  Date Value  03/18/2019 8.0 (L)   Albumin (g/dL)  Date Value  03/17/2019 3.8   Corrected Ca: 8.16 mg/dL  Assessment/Plan: 28 year old male with a medical history as indicated below who presented to the ED after taking 38 tablets of Phenergan, 19 tablets of losartan, 42 tablets of amlodipine, and 78 tablets of clonazepam and noted electrolyte abnormalities. Furosemide 40 mg IV x 1 dose ordered 6/8.   Because this is a HD patient, electrolyte monitoring will be deferred to nephrology  Pharmacy will sign off of this consulatation  Vallery Sa, PharmD Clinical Pharmacist 03/18/2019

## 2019-03-19 ENCOUNTER — Inpatient Hospital Stay: Payer: BC Managed Care – PPO

## 2019-03-19 ENCOUNTER — Inpatient Hospital Stay: Payer: Self-pay

## 2019-03-19 DIAGNOSIS — J810 Acute pulmonary edema: Secondary | ICD-10-CM

## 2019-03-19 LAB — MAGNESIUM: Magnesium: 2.8 mg/dL — ABNORMAL HIGH (ref 1.7–2.4)

## 2019-03-19 LAB — BLOOD GAS, ARTERIAL
Acid-base deficit: 2.6 mmol/L — ABNORMAL HIGH (ref 0.0–2.0)
Bicarbonate: 24.9 mmol/L (ref 20.0–28.0)
FIO2: 0.9
MECHVT: 480 mL
Mechanical Rate: 22
O2 Saturation: 96.7 %
PEEP: 12 cmH2O
Patient temperature: 37
RATE: 22 resp/min
pCO2 arterial: 53 mmHg — ABNORMAL HIGH (ref 32.0–48.0)
pH, Arterial: 7.28 — ABNORMAL LOW (ref 7.350–7.450)
pO2, Arterial: 98 mmHg (ref 83.0–108.0)

## 2019-03-19 LAB — CBC WITH DIFFERENTIAL/PLATELET
Abs Immature Granulocytes: 0.38 10*3/uL — ABNORMAL HIGH (ref 0.00–0.07)
Basophils Absolute: 0 10*3/uL (ref 0.0–0.1)
Basophils Relative: 0 %
Eosinophils Absolute: 0 10*3/uL (ref 0.0–0.5)
Eosinophils Relative: 0 %
HCT: 23.9 % — ABNORMAL LOW (ref 39.0–52.0)
Hemoglobin: 7.9 g/dL — ABNORMAL LOW (ref 13.0–17.0)
Immature Granulocytes: 2 %
Lymphocytes Relative: 6 %
Lymphs Abs: 1.3 10*3/uL (ref 0.7–4.0)
MCH: 27.2 pg (ref 26.0–34.0)
MCHC: 33.1 g/dL (ref 30.0–36.0)
MCV: 82.4 fL (ref 80.0–100.0)
Monocytes Absolute: 2.2 10*3/uL — ABNORMAL HIGH (ref 0.1–1.0)
Monocytes Relative: 10 %
Neutro Abs: 17.6 10*3/uL — ABNORMAL HIGH (ref 1.7–7.7)
Neutrophils Relative %: 82 %
Platelets: 477 10*3/uL — ABNORMAL HIGH (ref 150–400)
RBC: 2.9 MIL/uL — ABNORMAL LOW (ref 4.22–5.81)
RDW: 13.8 % (ref 11.5–15.5)
WBC: 21.6 10*3/uL — ABNORMAL HIGH (ref 4.0–10.5)
nRBC: 0.1 % (ref 0.0–0.2)

## 2019-03-19 LAB — HEPATITIS B SURFACE ANTIGEN: Hepatitis B Surface Ag: NEGATIVE

## 2019-03-19 LAB — BASIC METABOLIC PANEL
Anion gap: 21 — ABNORMAL HIGH (ref 5–15)
BUN: 91 mg/dL — ABNORMAL HIGH (ref 6–20)
CO2: 21 mmol/L — ABNORMAL LOW (ref 22–32)
Calcium: 8.1 mg/dL — ABNORMAL LOW (ref 8.9–10.3)
Chloride: 99 mmol/L (ref 98–111)
Creatinine, Ser: 6.82 mg/dL — ABNORMAL HIGH (ref 0.61–1.24)
GFR calc Af Amer: 11 mL/min — ABNORMAL LOW (ref >60)
GFR calc non Af Amer: 10 mL/min — ABNORMAL LOW (ref >60)
Glucose, Bld: 124 mg/dL — ABNORMAL HIGH (ref 70–99)
Potassium: 4.5 mmol/L (ref 3.5–5.1)
Sodium: 141 mmol/L (ref 135–145)

## 2019-03-19 LAB — GLUCOSE, CAPILLARY: Glucose-Capillary: 170 mg/dL — ABNORMAL HIGH (ref 70–99)

## 2019-03-19 LAB — HEPATITIS B CORE ANTIBODY, TOTAL: Hep B Core Total Ab: NEGATIVE

## 2019-03-19 LAB — TRIGLYCERIDES: Triglycerides: 142 mg/dL (ref <150)

## 2019-03-19 LAB — HEPATITIS B SURFACE ANTIBODY, QUANTITATIVE: Hep B S AB Quant (Post): 346 m[IU]/mL (ref >9.9)

## 2019-03-19 LAB — PHOSPHORUS: Phosphorus: 9 mg/dL — ABNORMAL HIGH (ref 2.5–4.6)

## 2019-03-19 LAB — PROCALCITONIN: Procalcitonin: 4.73 ng/mL

## 2019-03-19 MED ORDER — VECURONIUM BROMIDE 10 MG IV SOLR
INTRAVENOUS | Status: AC
  Administered 2019-03-19: 18:00:00 10 mg via INTRAVENOUS
  Filled 2019-03-19: qty 10

## 2019-03-19 MED ORDER — FENTANYL CITRATE (PF) 100 MCG/2ML IJ SOLN
INTRAMUSCULAR | Status: AC
  Administered 2019-03-19: 06:00:00 100 ug via INTRAVENOUS
  Filled 2019-03-19: qty 2

## 2019-03-19 MED ORDER — MIDAZOLAM HCL 2 MG/2ML IJ SOLN
INTRAMUSCULAR | Status: AC
  Administered 2019-03-19: 2 mg via INTRAVENOUS
  Filled 2019-03-19: qty 2

## 2019-03-19 MED ORDER — STERILE WATER FOR INJECTION IJ SOLN
INTRAMUSCULAR | Status: AC
  Administered 2019-03-19: 10 mL
  Filled 2019-03-19: qty 10

## 2019-03-19 MED ORDER — ROCURONIUM BROMIDE 50 MG/5ML IV SOLN: 50.0000 mg | Freq: Once | INTRAVENOUS | Status: DC

## 2019-03-19 MED ORDER — CHLORHEXIDINE GLUCONATE 0.12% ORAL RINSE (MEDLINE KIT)
15.0000 mL | Freq: Two times a day (BID) | OROMUCOSAL | Status: DC
  Administered 2019-03-19 – 2019-03-22 (×7): 15 mL via OROMUCOSAL

## 2019-03-19 MED ORDER — SODIUM CHLORIDE 0.9 % IV SOLN
500.0000 mg | Freq: Three times a day (TID) | INTRAVENOUS | Status: AC
  Administered 2019-03-19 – 2019-03-21 (×7): 500 mg via INTRAVENOUS
  Filled 2019-03-19 (×10): qty 10

## 2019-03-19 MED ORDER — ORAL CARE MOUTH RINSE
15.0000 mL | OROMUCOSAL | Status: DC
  Administered 2019-03-19 – 2019-03-22 (×35): 15 mL via OROMUCOSAL

## 2019-03-19 MED ORDER — VECURONIUM BROMIDE 10 MG IV SOLR
10.0000 mg | Freq: Once | INTRAVENOUS | Status: AC
  Administered 2019-03-19: 10 mg via INTRAVENOUS

## 2019-03-19 MED ORDER — FENTANYL 100 MCG/HR TD PT72
MEDICATED_PATCH | TRANSDERMAL | Status: AC
  Filled 2019-03-19: qty 1

## 2019-03-19 MED ORDER — FENTANYL CITRATE (PF) 100 MCG/2ML IJ SOLN
100.0000 ug | Freq: Once | INTRAMUSCULAR | Status: AC
  Administered 2019-03-19: 06:00:00 100 ug via INTRAVENOUS

## 2019-03-19 MED ORDER — VECURONIUM BROMIDE 10 MG IV SOLR
INTRAVENOUS | Status: AC
  Administered 2019-03-19: 10 mg via INTRAVENOUS
  Filled 2019-03-19: qty 10

## 2019-03-19 MED ORDER — ROCURONIUM BROMIDE 50 MG/5ML IV SOLN
INTRAVENOUS | Status: AC
  Administered 2019-03-19: 06:00:00 50 mg via INTRAVENOUS
  Filled 2019-03-19: qty 1

## 2019-03-19 MED ORDER — ETOMIDATE 2 MG/ML IV SOLN
20.0000 mg | Freq: Once | INTRAVENOUS | Status: AC
  Administered 2019-03-19: 06:00:00 20 mg via INTRAVENOUS

## 2019-03-19 MED ORDER — ETOMIDATE 2 MG/ML IV SOLN
20.0000 mg | Freq: Once | INTRAVENOUS | Status: AC
  Administered 2019-03-19: 20 mg via INTRAVENOUS

## 2019-03-19 MED ORDER — ETOMIDATE 2 MG/ML IV SOLN
INTRAVENOUS | Status: AC
  Administered 2019-03-19: 20 mg via INTRAVENOUS
  Filled 2019-03-19: qty 10

## 2019-03-19 MED ORDER — MORPHINE SULFATE (PF) 2 MG/ML IV SOLN
2.0000 mg | Freq: Once | INTRAVENOUS | Status: AC
  Administered 2019-03-19: 2 mg via INTRAVENOUS

## 2019-03-19 MED ORDER — FENTANYL CITRATE (PF) 100 MCG/2ML IJ SOLN
50.0000 ug | Freq: Once | INTRAMUSCULAR | Status: AC
  Administered 2019-03-19: 08:00:00 50 ug via INTRAVENOUS

## 2019-03-19 MED ORDER — BUDESONIDE 0.5 MG/2ML IN SUSP
0.5000 mg | Freq: Two times a day (BID) | RESPIRATORY_TRACT | Status: DC
  Administered 2019-03-19 – 2019-03-28 (×17): 0.5 mg via RESPIRATORY_TRACT
  Filled 2019-03-19 (×20): qty 2

## 2019-03-19 MED ORDER — VECURONIUM BROMIDE 10 MG IV SOLR
10.0000 mg | Freq: Once | INTRAVENOUS | Status: AC
  Administered 2019-03-19: 18:00:00 10 mg via INTRAVENOUS

## 2019-03-19 MED ORDER — ALTEPLASE 2 MG IJ SOLR
2.0000 mg | Freq: Once | INTRAMUSCULAR | Status: DC
  Filled 2019-03-19: qty 2

## 2019-03-19 MED ORDER — PANTOPRAZOLE SODIUM 40 MG IV SOLR
40.0000 mg | Freq: Every day | INTRAVENOUS | Status: DC
  Administered 2019-03-19 – 2019-03-22 (×4): 40 mg via INTRAVENOUS
  Filled 2019-03-19 (×4): qty 40

## 2019-03-19 MED ORDER — STERILE WATER FOR INJECTION IJ SOLN
INTRAMUSCULAR | Status: AC
  Administered 2019-03-19: 18:00:00
  Filled 2019-03-19: qty 10

## 2019-03-19 MED ORDER — FENTANYL CITRATE (PF) 100 MCG/2ML IJ SOLN
100.0000 ug | Freq: Once | INTRAMUSCULAR | Status: AC
  Administered 2019-03-19: 100 ug via INTRAVENOUS

## 2019-03-19 MED ORDER — ROCURONIUM BROMIDE 50 MG/5ML IV SOLN
INTRAVENOUS | Status: AC
  Filled 2019-03-19: qty 1

## 2019-03-19 MED ORDER — ASPIRIN 325 MG PO TABS
325.0000 mg | ORAL_TABLET | Freq: Every day | ORAL | Status: DC
  Administered 2019-03-20 – 2019-03-22 (×3): 325 mg
  Filled 2019-03-19: qty 1

## 2019-03-19 MED ORDER — METHYLPREDNISOLONE SODIUM SUCC 40 MG IJ SOLR
40.0000 mg | Freq: Two times a day (BID) | INTRAMUSCULAR | Status: DC
  Administered 2019-03-19 – 2019-03-25 (×13): 40 mg via INTRAVENOUS
  Filled 2019-03-19 (×13): qty 1

## 2019-03-19 MED ORDER — ETOMIDATE 2 MG/ML IV SOLN
INTRAVENOUS | Status: AC
  Filled 2019-03-19: qty 10

## 2019-03-19 MED ORDER — PIPERACILLIN-TAZOBACTAM 3.375 G IVPB
3.3750 g | Freq: Two times a day (BID) | INTRAVENOUS | Status: AC
  Administered 2019-03-19 – 2019-03-24 (×12): 3.375 g via INTRAVENOUS
  Filled 2019-03-19 (×12): qty 50

## 2019-03-19 MED ORDER — ROCURONIUM BROMIDE 50 MG/5ML IV SOLN
50.0000 mg | Freq: Once | INTRAVENOUS | Status: AC
  Administered 2019-03-19: 50 mg via INTRAVENOUS

## 2019-03-19 MED ORDER — MIDAZOLAM HCL 2 MG/2ML IJ SOLN
2.0000 mg | Freq: Once | INTRAMUSCULAR | Status: AC
  Administered 2019-03-19: 22:00:00 2 mg via INTRAVENOUS

## 2019-03-19 MED ORDER — PROPOFOL 1000 MG/100ML IV EMUL
0.0000 ug/kg/min | INTRAVENOUS | Status: DC
  Administered 2019-03-19: 25 ug/kg/min via INTRAVENOUS
  Administered 2019-03-19: 20 ug/kg/min via INTRAVENOUS
  Administered 2019-03-19: 30 ug/kg/min via INTRAVENOUS
  Administered 2019-03-19: 10:00:00 40 ug/kg/min via INTRAVENOUS
  Administered 2019-03-20 (×2): 30 ug/kg/min via INTRAVENOUS
  Administered 2019-03-20: 20 ug/kg/min via INTRAVENOUS
  Administered 2019-03-21 (×2): 30 ug/kg/min via INTRAVENOUS
  Administered 2019-03-21: 40 ug/kg/min via INTRAVENOUS
  Administered 2019-03-21: 20 ug/kg/min via INTRAVENOUS
  Administered 2019-03-22: 40 ug/kg/min via INTRAVENOUS
  Administered 2019-03-22: 50 ug/kg/min via INTRAVENOUS
  Filled 2019-03-19 (×13): qty 100

## 2019-03-19 MED ORDER — FENTANYL 2500MCG IN NS 250ML (10MCG/ML) PREMIX INFUSION
50.0000 ug/h | INTRAVENOUS | Status: DC
  Administered 2019-03-19: 09:00:00 100 ug/h via INTRAVENOUS
  Filled 2019-03-19: qty 250

## 2019-03-19 MED ORDER — FENTANYL CITRATE (PF) 100 MCG/2ML IJ SOLN
INTRAMUSCULAR | Status: AC
  Administered 2019-03-19: 08:00:00 50 ug via INTRAVENOUS
  Filled 2019-03-19: qty 2

## 2019-03-19 MED ORDER — FENTANYL 2500MCG IN NS 250ML (10MCG/ML) PREMIX INFUSION
50.0000 ug/h | INTRAVENOUS | Status: DC
  Administered 2019-03-19: 250 ug/h via INTRAVENOUS
  Administered 2019-03-20: 200 ug/h via INTRAVENOUS
  Administered 2019-03-20: 16:00:00 150 ug/h via INTRAVENOUS
  Administered 2019-03-21 – 2019-03-22 (×3): 200 ug/h via INTRAVENOUS
  Filled 2019-03-19 (×7): qty 250

## 2019-03-19 MED ORDER — IPRATROPIUM-ALBUTEROL 0.5-2.5 (3) MG/3ML IN SOLN
3.0000 mL | RESPIRATORY_TRACT | Status: DC
  Administered 2019-03-19 – 2019-03-26 (×41): 3 mL via RESPIRATORY_TRACT
  Filled 2019-03-19 (×40): qty 3

## 2019-03-19 MED ORDER — MIDAZOLAM 50MG/50ML (1MG/ML) PREMIX INFUSION
0.5000 mg/h | INTRAVENOUS | Status: DC
  Administered 2019-03-19: 0.5 mg/h via INTRAVENOUS
  Administered 2019-03-20: 5 mg/h via INTRAVENOUS
  Administered 2019-03-20: 2 mg/h via INTRAVENOUS
  Administered 2019-03-21: 2.5 mg/h via INTRAVENOUS
  Administered 2019-03-21 – 2019-03-22 (×2): 5 mg/h via INTRAVENOUS
  Filled 2019-03-19 (×6): qty 50

## 2019-03-19 MED ORDER — LORAZEPAM 2 MG/ML IJ SOLN
2.0000 mg | INTRAMUSCULAR | Status: DC | PRN
  Administered 2019-03-19: 17:00:00 2 mg via INTRAVENOUS
  Administered 2019-03-19 (×3): 4 mg via INTRAVENOUS
  Filled 2019-03-19 (×5): qty 2

## 2019-03-19 NOTE — Progress Notes (Signed)
[facility] NAME: Glenn Rasmussen    MR#:  [id number]  DATE OF BIRTH:  May 06, 1991  SUBJECTIVE:   Patient got intubated early hours of the morning with increasing respiratory distress.  REVIEW OF SYSTEMS:  Review of Systems  Constitutional: Negative for chills, fever and weight loss.  HENT: Negative for ear discharge, ear pain and nosebleeds.   Eyes: Negative for blurred vision, pain and discharge.  Respiratory: Positive for shortness of breath. Negative for sputum production, wheezing and stridor.   Cardiovascular: Negative for palpitations, orthopnea and PND.  Gastrointestinal: Negative for abdominal pain, diarrhea, nausea and vomiting.  Genitourinary: Negative for frequency and urgency.  Musculoskeletal: Negative for back pain and joint pain.  Neurological: Positive for weakness. Negative for sensory change, speech change and focal weakness.  Psychiatric/Behavioral: Negative for depression and hallucinations. The patient is nervous/anxious.     DRUG ALLERGIES:  No Known Allergies  VITALS:  Blood pressure (!) 117/58, pulse 79, temperature 98.4 F (36.9 C), temperature source Axillary, resp. rate 15, height 5\' 7"  (1.702 m), weight 97.3 kg, SpO2 95 %.  PHYSICAL EXAMINATION:  Physical Exam   GENERAL:  28 y.o.-year-old criticallylly ill-appearing patient lying in the bed with  No acute distress. obese EYES: Pupils equal, round, reactive to light and accommodation. No scleral icterus. Extraocular muscles intact. On the ventilator HEENT: Head atraumatic, normocephalic. Oropharynx and nasopharynx clear. NG+ NECK:  Supple, no jugular venous distention. No thyroid enlargement, no tenderness.   LUNGS: distant breath sounds bilaterally, no wheezing Mild use of accessory muscles of respiration.  Decreased bibasilar breath sounds CARDIOVASCULAR: S1, S2 normal. No murmurs, rubs, or gallops.  ABDOMEN: Soft, nontender, nondistended. Bowel  sounds present. No organomegaly or mass.  EXTREMITIES: No  cyanosis, or clubbing.  1+ pedal edema noted NEUROLOGIC: Patient sedated    PSYCHIATRIC: on the vent SKIN: No obvious rash, lesion, or ulcer.    LABORATORY PANEL:   CBC Recent Labs  Lab 03/19/19 0541  WBC 21.6*  HGB 7.9*  HCT 23.9*  PLT 477*   ------------------------------------------------------------------------------------------------------------------  Chemistries  Recent Labs  Lab 03/17/19 0536  03/19/19 0541  NA 137   < > 141  K 4.2   < > 4.5  CL 99   < > 99  CO2 19*   < > 21*  GLUCOSE 132*   < > 124*  BUN 76*   < > 91*  CREATININE 5.96*   < > 6.82*  CALCIUM 8.5*   < > 8.1*  MG 2.6*   < > 2.8*  AST 58*  --   --   ALT 50*  --   --   ALKPHOS 53  --   --   BILITOT 0.8  --   --    < > = values in this interval not displayed.   ------------------------------------------------------------------------------------------------------------------  Cardiac Enzymes Recent Labs  Lab 03/16/19 1634  TROPONINI 0.26*   ------------------------------------------------------------------------------------------------------------------  RADIOLOGY:  Dg Abd 1 View  Result Date: 03/19/2019 CLINICAL DATA:  Follow-up ileus EXAM: ABDOMEN - 1 VIEW COMPARISON:  03/18/2019 FINDINGS: Gastric catheter is again noted within the stomach. Right femoral central line is noted. Diffuse gaseous distension of the colon is again seen with improved appearance of the small bowel with only a few mildly prominent loops identified. No free air is seen. No bony abnormality is noted. IMPRESSION: Improving ileus with only colonic distension on the current exam. Electronically Signed   By: Elta Guadeloupe  Lukens M.D.   On: 03/19/2019 08:06   Dg Abd 1 View  Result Date: 03/18/2019 CLINICAL DATA:  Follow-up ileus EXAM: ABDOMEN - 1 VIEW COMPARISON:  Film from the previous day. FINDINGS: Gastric catheter is noted coiled within the stomach. There remains  scattered large and small bowel gas. Some progression and small-bowel dilatation is noted when compare with the previous day. Right femoral central line is seen. No bony abnormality is noted. IMPRESSION: Gaseous distension of the large and small bowel with some small bowel dilatation. This would be consistent with a generalized ileus. Correlation with the physical exam is recommended. Electronically Signed   By: Inez Catalina M.D.   On: 03/18/2019 07:31   Dg Chest Port 1 View  Result Date: 03/19/2019 CLINICAL DATA:  Status post intubation EXAM: PORTABLE CHEST 1 VIEW COMPARISON:  03/19/2019 FINDINGS: Endotracheal tube is now seen in satisfactory position. Gastric catheter and left jugular central line are again seen. Cardiac shadow is stable. Stable bilateral infiltrates. IMPRESSION: Endotracheal tube in satisfactory position. The remainder of the exam is stable. Electronically Signed   By: Inez Catalina M.D.   On: 03/19/2019 08:07   Dg Chest Port 1 View  Result Date: 03/19/2019 CLINICAL DATA:  Acute respiratory failure EXAM: PORTABLE CHEST 1 VIEW COMPARISON:  03/18/2019 FINDINGS: Cardiac shadow is stable. Gastric catheter and left jugular central line are again seen and stable. Patchy bilateral infiltrates are again identified and stable given some technical variation in the film. No sizable effusion or pneumothorax is noted. No bony abnormality is seen. IMPRESSION: Stable patchy infiltrates bilaterally. Electronically Signed   By: Inez Catalina M.D.   On: 03/19/2019 08:04   Dg Chest Port 1 View  Result Date: 03/18/2019 CLINICAL DATA:  Acute respiratory failure EXAM: PORTABLE CHEST 1 VIEW COMPARISON:  03/17/2019 FINDINGS: Cardiac shadow is stable. Left jugular central line is again seen and stable. Gastric catheter is noted coiled within the stomach. Patchy infiltrates are noted bilaterally stable from the prior exam. No new focal abnormality is noted. IMPRESSION: Stable bilateral infiltrates.  Electronically Signed   By: Inez Catalina M.D.   On: 03/18/2019 07:30    EKG:   Orders placed or performed during the hospital encounter of 03/08/19  . ED EKG  . ED EKG  . ED EKG  . ED EKG  . EKG 12-Lead  . EKG 12-Lead  . EKG 12-Lead  . EKG 12-Lead  . EKG 12-Lead  . EKG 12-Lead  . EKG 12-Lead  . EKG 12-Lead  . EKG 12-Lead  . EKG 12-Lead  . EKG 12-Lead  . EKG 12-Lead  . EKG 12-Lead  . EKG 12-Lead  . EKG - 12 lead  . EKG - 12 lead  . EKG 12-Lead  . EKG 12-Lead    ASSESSMENT AND PLAN:   28 year old male with past medical history significant for hypertension, IBS admitted secondary to overdose on Phenergan, losartan, Norvasc and Klonopin  1.  Acute Hypoxic respiratory failure due to volume overload/?ARDS -Patient is extubated on 03/12/2019--now re-intubated on 03/19/2019 - Patient went into respiratory distress now re-intubated. -EKG--sinus tachycardia -VQ scan low probability for PE. D/ced heparin drip. -Continue hemodialysis with ultrafiltration, IV Solu-Medrol, empiric IV Unasyn  2.Intentional drug overdose -patient overdosed on 38 tablets of Phenergan, 19 tablets of losartan, 42 tablets of Norvasc and 78 tablets of Klonopin -Urine tox positive for benzos and tricyclics -Appreciate psych consult.  Once patient is medically stable, will need inpatient psychiatry admission.  3.  Acute renal  failure-likely ATN from hypotension.  -received on CRRT, metabolic acidosis.  Appreciate nephrology consult - CRRTon hold for now-- now on hemodialysis with UF  4. Pneumonia-follow-up procalcitonin and WBC. - Currently on Iv unasyn -wbc 21K--18K  5.  DVT prophylaxis- on heparin SQ  6.  GERD-Protonix  7.  Elevated troponin-likely demand ischemia, troponin plateaued vs due to renal failure -  Most recent echo this admission showing normal LV function and no wall motion abnormalities.  8.Anemia appears due to current illness with renal failure No active bleed   D/w dr Mortimer Fries   CODE STATUS: Full code  TOTAL TIME TAKING CARE OF THIS PATIENT: 35 minutes.   POSSIBLE D/C IN ? DAYS, DEPENDING ON CLINICAL CONDITION.   Fritzi Mandes M.D on 03/19/2019 at 1:35 PM  Between 7am to 6pm - Pager - [phone]  After 6pm go to [email or web address] - password [email or web address]  Office  [phone]  CC: Primary care physician; Maeola Sarah, MD

## 2019-03-19 NOTE — Progress Notes (Signed)
Follow up - Critical Care Medicine Note  Patient Details:    Glenn Rasmussen is an 28 y.o. male.admitted with overdose on benzodiazepine, ARB, calcium channel blocker, and Phenergan.  Required intubation and hemodialysis for respiratory failure and renal failure.  Subsequently transferred out of the ICU.    Readmitted to ICU  due to respiratory distress.  Lines, Airways, Drains: CVC Triple Lumen 03/09/19 Right Femoral (Active)  Indication for Insertion or Continuance of Line Prolonged intravenous therapies 03/17/2019 12:00 PM  Site Assessment Clean;Dry;Intact 03/17/2019 12:00 PM  Proximal Lumen Status Flushed;Saline locked;Blood return noted 03/17/2019 12:00 PM  Medial Lumen Status Flushed;Saline locked;Blood return noted 03/17/2019 12:00 PM  Distal Lumen Status Flushed;Saline locked;Blood return noted 03/17/2019 12:00 PM  Dressing Type Transparent;Occlusive 03/17/2019 12:00 PM  Dressing Status Clean;Dry;Intact 03/17/2019 12:00 PM  Line Care Connections checked and tightened 03/17/2019 12:00 PM  Dressing Intervention Other (Comment) 03/17/2019 12:00 PM  Dressing Change Due 03/20/19 03/17/2019 12:00 PM     NG/OG Tube Nasogastric Left nare Documented cm marking at nare/ corner of mouth 75 cm (Active)  Cm Marking at Nare/Corner of Mouth (if applicable) 75 cm 03/13/5992  4:00 PM  Site Assessment Clean;Dry;Intact 03/17/2019  4:00 PM  Ongoing Placement Verification No change in cm markings or external length of tube from initial placement;No change in respiratory status;No acute changes, not attributed to clinical condition 03/17/2019  4:00 PM  Status Suction-low intermittent 03/17/2019  4:00 PM  Drainage Appearance Brown 03/17/2019  4:00 PM  Output (mL) 50 mL 03/17/2019  4:00 PM     Urethral Catheter Wells Guiles RN Non-latex 14 Fr. (Active)  Indication for Insertion or Continuance of Catheter Therapy based on hourly urine output monitoring and documentation for critical condition (NOT STRICT I&O) 03/17/2019  4:00 PM  Site  Assessment Clean;Intact;Dry 03/17/2019  4:00 PM  Catheter Maintenance Bag below level of bladder;Catheter secured;Drainage bag/tubing not touching floor;No dependent loops;Insertion date on drainage bag;Seal intact 03/17/2019  4:00 PM  Collection Container Standard drainage bag 03/17/2019  4:00 PM  Securement Method Leg strap 03/17/2019  4:00 PM  Urinary Catheter Interventions Unclamped 03/17/2019  4:00 PM  Output (mL) 100 mL 03/17/2019  4:00 PM    Anti-infectives:  Anti-infectives (From admission, onward)   Start     Dose/Rate Route Frequency Ordered Stop   03/18/19 0200  Ampicillin-Sulbactam (UNASYN) 3 g in sodium chloride 0.9 % 100 mL IVPB     3 g 200 mL/hr over 30 Minutes Intravenous Every 12 hours 03/18/19 0044     03/17/19 2100  metroNIDAZOLE (FLAGYL) IVPB 500 mg  Status:  Discontinued     500 mg 100 mL/hr over 60 Minutes Intravenous Every 8 hours 03/17/19 2048 03/18/19 0031   03/11/19 1800  cefTRIAXone (ROCEPHIN) 2 g in sodium chloride 0.9 % 100 mL IVPB  Status:  Discontinued     2 g 200 mL/hr over 30 Minutes Intravenous Every 24 hours 03/11/19 1243 03/18/19 0048   03/11/19 1200  vancomycin (VANCOCIN) 1,500 mg in sodium chloride 0.9 % 500 mL IVPB  Status:  Discontinued     1,500 mg 250 mL/hr over 120 Minutes Intravenous  Once 03/11/19 1020 03/11/19 1032   03/11/19 1200  vancomycin (VANCOCIN) 1,500 mg in sodium chloride 0.9 % 500 mL IVPB     1,500 mg 250 mL/hr over 120 Minutes Intravenous  Once 03/11/19 1030 03/11/19 1333   03/10/19 1200  vancomycin (VANCOCIN) 1,500 mg in sodium chloride 0.9 % 500 mL IVPB  Status:  Discontinued  1,500 mg 250 mL/hr over 120 Minutes Intravenous  Once 03/10/19 1055 03/10/19 1114   03/10/19 1200  vancomycin (VANCOCIN) 1,500 mg in sodium chloride 0.9 % 500 mL IVPB     1,500 mg 250 mL/hr over 120 Minutes Intravenous  Once 03/10/19 1114 03/10/19 1345   03/09/19 0515  piperacillin-tazobactam (ZOSYN) IVPB 3.375 g  Status:  Discontinued     3.375 g 12.5 mL/hr  over 240 Minutes Intravenous Every 8 hours 03/09/19 0507 03/11/19 1243      Microbiology: Results for orders placed or performed during the hospital encounter of 03/08/19  SARS Coronavirus 2 (CEPHEID - Performed in [place] hospital lab), Hosp Order     Status: None   Collection Time: 03/08/19 11:48 PM   Specimen: Nasopharyngeal Swab  Result Value Ref Range Status   SARS Coronavirus 2 NEGATIVE NEGATIVE Final    Comment: (NOTE) If result is NEGATIVE SARS-CoV-2 target nucleic acids are NOT DETECTED. The SARS-CoV-2 RNA is generally detectable in upper and lower  respiratory specimens during the acute phase of infection. The lowest  concentration of SARS-CoV-2 viral copies this assay can detect is 250  copies / mL. A negative result does not preclude SARS-CoV-2 infection  and should not be used as the sole basis for treatment or other  patient management decisions.  A negative result may occur with  improper specimen collection / handling, submission of specimen other  than nasopharyngeal swab, presence of viral mutation(s) within the  areas targeted by this assay, and inadequate number of viral copies  (<250 copies / mL). A negative result must be combined with clinical  observations, patient history, and epidemiological information. If result is POSITIVE SARS-CoV-2 target nucleic acids are DETECTED. The SARS-CoV-2 RNA is generally detectable in upper and lower  respiratory specimens dur ing the acute phase of infection.  Positive  results are indicative of active infection with SARS-CoV-2.  Clinical  correlation with patient history and other diagnostic information is  necessary to determine patient infection status.  Positive results do  not rule out bacterial infection or co-infection with other viruses. If result is PRESUMPTIVE POSTIVE SARS-CoV-2 nucleic acids MAY BE PRESENT.   A presumptive positive result was obtained on the submitted specimen  and confirmed on repeat  testing.  While 2019 novel coronavirus  (SARS-CoV-2) nucleic acids may be present in the submitted sample  additional confirmatory testing may be necessary for epidemiological  and / or clinical management purposes  to differentiate between  SARS-CoV-2 and other Sarbecovirus currently known to infect humans.  If clinically indicated additional testing with an alternate test  methodology [phone]) is advised. The SARS-CoV-2 RNA is generally  detectable in upper and lower respiratory sp ecimens during the acute  phase of infection. The expected result is Negative. Fact Sheet for Patients:  StrictlyIdeas.no Fact Sheet for Healthcare Providers: BankingDealers.co.za This test is not yet approved or cleared by the [place] FDA and has been authorized for detection and/or diagnosis of SARS-CoV-2 by FDA under an Emergency Use Authorization (EUA).  This EUA will remain in effect (meaning this test can be used) for the duration of the COVID-19 declaration under Section 564(b)(1) of the Act, 21 U.S.C. section 360bbb-3(b)(1), unless the authorization is terminated or revoked sooner. Performed at [facility], [address] [place], [place] [address]   MRSA PCR Screening     Status: None   Collection Time: 03/09/19  2:48 AM   Specimen: Nasal Mucosa; Nasopharyngeal  Result Value Ref Range  Status   MRSA by PCR NEGATIVE NEGATIVE Final    Comment:        The GeneXpert MRSA Assay (FDA approved for NASAL specimens only), is one component of a comprehensive MRSA colonization surveillance program. It is not intended to diagnose MRSA infection nor to guide or monitor treatment for MRSA infections. Performed at [facility], [address] [place], [place] [address]   Culture, respiratory (non-expectorated)     Status: None   Collection Time: 03/09/19  8:00 AM   Specimen: Tracheal Aspirate; Respiratory  Result Value Ref  Range Status   Specimen Description   Final    TRACHEAL ASPIRATE Performed at [facility], [address] [place], [place] [address]    Special Requests   Final    NONE Performed at [facility], [address] [place], [place] [address]    Gram Stain   Final    ABUNDANT WBC PRESENT,BOTH PMN AND MONONUCLEAR MODERATE GRAM POSITIVE COCCI RARE YEAST    Culture   Final    MODERATE GROUP B STREP(S.AGALACTIAE)ISOLATED TESTING AGAINST S. AGALACTIAE NOT ROUTINELY PERFORMED DUE TO PREDICTABILITY OF AMP/PEN/VAN SUSCEPTIBILITY. Performed at [facility], [place] [address] [place], [place] [address]    Report Status 03/11/2019 FINAL  Final  CULTURE, BLOOD (ROUTINE X 2) w Reflex to ID Panel     Status: None   Collection Time: 03/09/19  2:53 PM   Specimen: BLOOD  Result Value Ref Range Status   Specimen Description   Final    BLOOD BLOOD RIGHT ARM Performed at [facility], [address] [place], [place] [address]    Special Requests   Final    BOTTLES DRAWN AEROBIC AND ANAEROBIC Blood Culture results may not be optimal due to an excessive volume of blood received in culture bottles Performed at [facility], [address] [place], [place] [address]    Culture   Final    NO GROWTH 5 DAYS Performed at [facility], [place] [address] [place], [place] [address]    Report Status 03/17/2019 FINAL  Final  CULTURE, BLOOD (ROUTINE X 2) w Reflex to ID Panel     Status: None   Collection Time: 03/09/19  5:00 PM   Specimen: BLOOD RIGHT ARM  Result Value Ref Range Status   Specimen Description   Final    BLOOD RIGHT ARM Performed at [facility], [address], [place], [place] [address]    Special Requests   Final    BOTTLES DRAWN AEROBIC AND ANAEROBIC Blood Culture results may not be optimal due to an excessive volume of blood received in culture bottles Performed at [facility], [address] [place], [place]  [address]    Culture   Final    NO GROWTH 5 DAYS Performed at [facility], [place] [address], [place], [place] [address]    Report Status 03/15/2019 FINAL  Final  Urine Culture     Status: None   Collection Time: 03/11/19  4:33 AM   Specimen: Nasal Mucosa; Urine  Result Value Ref Range Status   Specimen Description   Final    URINE, RANDOM Performed at [facility], [address] [place], [place] [address]    Special Requests   Final    NONE Performed at [facility], [address] [place],  [address]    Culture   Final    NO GROWTH Performed at  [facility], [place] [address], [place], [place] [address]    Report Status 03/12/2019 FINAL  Final    Best Practice/Protocols:  VTE Prophylaxis: Heparin (SQ)   Events: 5/31 admitted for severe shock and resp failure DRUG OD 6/1 started CRRT multiorgan failure, resp failure, vasopressors 6/2 severe hypoxia UF rates increased to 500 6/3 DNR, multiorgan failure 6/4 CODE STATUS changed to full code,high risk for reintubation severe hypoxia on CRRT 6/5 high flow [place], on CRRT, high risk for intubation 6/7 resp failure resolved, off CRRT, transfer to gen med floor 6/9 readmitted to ICU due to respiratory distress, abdominal distention due to ileus. 6/11 developed acute pulmonary edema and required emergent intubation mechanical ventilator  Studies: Ct Abdomen Pelvis Wo Contrast  Result Date: 03/17/2019 CLINICAL DATA:  Overdose, abdominal distension EXAM: CT ABDOMEN AND PELVIS WITHOUT CONTRAST TECHNIQUE: Multidetector CT imaging of the abdomen and pelvis was performed following the standard protocol without IV contrast. COMPARISON:  04/27/2007 FINDINGS: Lower chest: Small bilateral pleural effusions and associated atelectasis or consolidation. Hepatobiliary: No focal liver abnormality is seen. Status post cholecystectomy. No biliary dilatation. Pancreas: Unremarkable. No pancreatic ductal dilatation or  surrounding inflammatory changes. Spleen: Normal in size without significant abnormality. Adrenals/Urinary Tract: Adrenal glands are unremarkable. Kidneys are normal, without renal calculi, solid lesion, or hydronephrosis. Foley catheter in the bladder Stomach/Bowel: Appendix is surgically absent. The stomach, small bowel, and colon are diffusely distended by air and fluid, with fluid present to the rectum. Vascular/Lymphatic: Right femoral central venous catheter. No enlarged abdominal or pelvic lymph nodes. Reproductive: No mass or other significant abnormality. Other: No abdominal wall hernia or abnormality. Small volume ascites. Anasarca. Musculoskeletal: No acute or significant osseous findings. IMPRESSION: 1. The stomach, small bowel, and colon are diffusely distended by air and fluid, with fluid present to the rectum. Findings are consistent with ileus. 2.  Pleural effusions, ascites, and anasarca. 3.  Status post cholecystectomy and appendectomy. Electronically Signed   By: Eddie Candle M.D.   On: 03/17/2019 08:35   Dg Abd 1 View  Result Date: 03/18/2019 CLINICAL DATA:  Follow-up ileus EXAM: ABDOMEN - 1 VIEW COMPARISON:  Film from the previous day. FINDINGS: Gastric catheter is noted coiled within the stomach. There remains scattered large and small bowel gas. Some progression and small-bowel dilatation is noted when compare with the previous day. Right femoral central line is seen. No bony abnormality is noted. IMPRESSION: Gaseous distension of the large and small bowel with some small bowel dilatation. This would be consistent with a generalized ileus. Correlation with the physical exam is recommended. Electronically Signed   By: Inez Catalina M.D.   On: 03/18/2019 07:31   Dg Abd 1 View  Result Date: 03/17/2019 CLINICAL DATA:  Check gastric catheter placement EXAM: ABDOMEN - 1 VIEW COMPARISON:  None. FINDINGS: Gastric catheter is noted coiled within the stomach. Scattered large and small bowel gas is  seen similar to that seen on prior CT examination. No free air is noted. IMPRESSION: Gastric catheter coiled within the stomach. Electronically Signed   By: Inez Catalina M.D.   On: 03/17/2019 10:09   Dg Abd 1 View  Result Date: 03/14/2019 CLINICAL DATA:  28 year old male with history of abdominal pain. EXAM: ABDOMEN - 1 VIEW COMPARISON:  03/09/2019. FINDINGS: Right femoral central venous catheter with tip projecting over the expected location of the proximal right common femoral vein. Gas and stool are seen scattered throughout the colon extending to the level of the distal rectum.  No pathologic distension of small bowel is noted. Several nondilated gas-filled loops of small bowel or noted projecting over the central abdomen. No gross evidence of pneumoperitoneum. IMPRESSION: 1. Nonspecific, nonobstructive bowel gas pattern, as above. 2. No pneumoperitoneum. Electronically Signed   By: Vinnie Langton M.D.   On: 03/14/2019 22:34   Dg Abd 1 View  Result Date: 03/09/2019 CLINICAL DATA:  28 year old male presenting after drug overdose. EXAM: ABDOMEN - 1 VIEW COMPARISON:  Portable chest today. CT Abdomen and Pelvis 05/02/2007. FINDINGS: Semi upright AP view at 0510 hours. Enteric tube side hole is at the level of the gastric body. Nonobstructed bowel-gas pattern. No osseous abnormality identified. IMPRESSION: Enteric tube side hole at the level of the gastric body. Normal visible bowel gas pattern. Electronically Signed   By: Genevie Ann M.D.   On: 03/09/2019 05:46   Ct Head Wo Contrast  Result Date: 03/13/2019 CLINICAL DATA:  Drug overdose EXAM: CT HEAD WITHOUT CONTRAST TECHNIQUE: Contiguous axial images were obtained from the base of the skull through the vertex without intravenous contrast. COMPARISON:  Head CT 12/29/2006 FINDINGS: Brain: There is no mass, hemorrhage or extra-axial collection. The size and configuration of the ventricles and extra-axial CSF spaces are normal. The brain parenchyma is normal,  without acute or chronic infarction. Vascular: No abnormal hyperdensity of the major intracranial arteries or dural venous sinuses. No intracranial atherosclerosis. Skull: The visualized skull base, calvarium and extracranial soft tissues are normal. Sinuses/Orbits: No fluid levels or advanced mucosal thickening of the visualized paranasal sinuses. No mastoid or middle ear effusion. The orbits are normal. IMPRESSION: Normal head CT. Electronically Signed   By: Ulyses Jarred M.D.   On: 03/13/2019 02:30   Nm Pulmonary Perf And Vent  Result Date: 03/16/2019 CLINICAL DATA:  Shortness of breath EXAM: NUCLEAR MEDICINE VENTILATION - PERFUSION LUNG SCAN VIEWS: Anterior and posterior: Ventilation and perfusion. Patient was unable to tolerate additional imaging. RADIOPHARMACEUTICALS:  32.1 mCi of Tc-57m DTPA aerosol inhalation and 4.38 mCi Tc64m MAA IV COMPARISON:  Chest radiograph March 15, 2017 FINDINGS: Ventilation: Radiotracer uptake is homogeneous and symmetric bilaterally. No evident ventilation defects. Perfusion: Radiotracer uptake is homogeneous and symmetric bilaterally. No perfusion defects evident. IMPRESSION: No ventilation or perfusion defects. Very low probability of pulmonary embolus. Electronically Signed   By: Lowella Grip III M.D.   On: 03/16/2019 13:22   Dg Chest Port 1 View  Result Date: 03/18/2019 CLINICAL DATA:  Acute respiratory failure EXAM: PORTABLE CHEST 1 VIEW COMPARISON:  03/17/2019 FINDINGS: Cardiac shadow is stable. Left jugular central line is again seen and stable. Gastric catheter is noted coiled within the stomach. Patchy infiltrates are noted bilaterally stable from the prior exam. No new focal abnormality is noted. IMPRESSION: Stable bilateral infiltrates. Electronically Signed   By: Inez Catalina M.D.   On: 03/18/2019 07:30   Dg Chest Port 1 View  Result Date: 03/17/2019 CLINICAL DATA:  Respiratory failure, distress EXAM: PORTABLE CHEST 1 VIEW COMPARISON:  03/16/2019 FINDINGS:  Left central line is unchanged. NG tube placement into the stomach. Bilateral interstitial and airspace opacities are similar prior study. Mild cardiomegaly. Small bilateral effusions. No acute bony abnormality. IMPRESSION: Diffuse interstitial and airspace opacities are similar prior study. Small effusions. Electronically Signed   By: Rolm Baptise M.D.   On: 03/17/2019 10:08   Dg Chest Port 1 View  Result Date: 03/16/2019 CLINICAL DATA:  Chest pain. EXAM: PORTABLE CHEST 1 VIEW COMPARISON:  One-view chest x-ray 03/13/2019 FINDINGS: A left IJ line is  stable. Heart size is exaggerated by low lung volumes. Patchy bilateral interstitial and airspace opacities have increased. Airspace disease is asymmetric on the left. No definite effusions are present. Visualized soft tissues and bony thorax are unremarkable. IMPRESSION: 1. Increasing interstitial and airspace opacities, left greater than right. Findings are compatible with ARDS or infection. 2. Stable left IJ line. Electronically Signed   By: San Morelle M.D.   On: 03/16/2019 04:57   Dg Chest Port 1 View  Result Date: 03/13/2019 CLINICAL DATA:  Acute respiratory failure EXAM: PORTABLE CHEST 1 VIEW COMPARISON:  03/11/2019 FINDINGS: The left-sided catheter tip projects over the SVC. The heart size is stable from prior study. There is worsening vascular congestion in worsening bilateral airspace opacities. There are likely bilateral pleural effusions. No evidence of a pneumothorax. The patient has been extubated. Enteric tube has been removed. IMPRESSION: 1. Status post extubation. 2. Remaining lines and tubes as above. 3. Worsening vascular congestion. Persistent small bilateral pleural effusions. Electronically Signed   By: Constance Holster M.D.   On: 03/13/2019 03:46   Dg Chest Port 1 View  Result Date: 03/11/2019 CLINICAL DATA:  Acute respiratory failure EXAM: PORTABLE CHEST 1 VIEW COMPARISON:  03/10/2019 FINDINGS: Cardiac shadow is stable.  Endotracheal tube, gastric catheter and left jugular central line are again seen and stable. Increasing vascular congestion is noted when compare with the prior exam with enlarging left pleural effusion. No focal confluent infiltrate is seen. IMPRESSION: Worsening vascular congestion with left-sided pleural effusion. Tubes and lines as described. Electronically Signed   By: Inez Catalina M.D.   On: 03/11/2019 07:07   Dg Chest Port 1 View  Result Date: 03/10/2019 CLINICAL DATA:  Hypoxia.  Patient on a ventilator. EXAM: PORTABLE CHEST 1 VIEW COMPARISON:  Earlier film, same date. FINDINGS: The endotracheal tube is in good position, 5 cm above the carina. The NG tube is coursing down the esophagus and into the stomach. The left IJ central venous catheter is stable. The lungs demonstrate improved aeration compared to the earlier film. Less edema and atelectasis. No pneumothorax. IMPRESSION: 1. Support apparatus in good position without complicating features. 2. Improved lung aeration since earlier chest film. Electronically Signed   By: Marijo Sanes M.D.   On: 03/10/2019 20:35   Portable Chest Xray  Result Date: 03/10/2019 CLINICAL DATA:  Respiratory failure EXAM: PORTABLE CHEST 1 VIEW COMPARISON:  03/09/2019 FINDINGS: Endotracheal tube tip at the clavicular heads. The orogastric tube at least reaches the stomach. Left IJ dialysis catheter tip at the upper cavoatrial junction. Worsening aeration with obscured left diaphragm. There is diffuse hazy lung opacity. Suspect layering pleural effusions. Cardiomegaly and vascular pedicle widening. IMPRESSION: Stable, unremarkable hardware positioning. Worsening aeration, likely atelectasis, layering fluid, and edema. Electronically Signed   By: Monte Fantasia M.D.   On: 03/10/2019 06:22   Dg Chest Port 1 View  Result Date: 03/09/2019 CLINICAL DATA:  Central line placement EXAM: PORTABLE CHEST 1 VIEW COMPARISON:  03/09/2019 FINDINGS: The endotracheal tube terminates  above the carina. The newly placed left-sided central venous catheter tip projects over the SVC/distal left brachiocephalic vein. The enteric tube extends below the left hemidiaphragm. There is no left-sided pneumothorax. The heart size is stable. No right-sided pneumothorax. Coarsened lung markings are again identified bilaterally. IMPRESSION: 1. Newly placed left-sided central venous catheter as above with no evidence of a pneumothorax. 2. Remaining lines and tubes as above. 3. Stable cardiac size. Persistent coarse airspace opacities bilaterally similar to prior study. Electronically Signed  By: Constance Holster M.D.   On: 03/09/2019 19:58   Dg Chest Port 1 View  Result Date: 03/09/2019 CLINICAL DATA:  28 year old male presenting after drug overdose. EXAM: PORTABLE CHEST 1 VIEW COMPARISON:  02/09/2019 and earlier. FINDINGS: Portable AP semi upright view at 0510 hours. Intubated, endotracheal tube tip at the level the clavicles. Enteric tube courses to the abdomen, tip not included. Normal cardiac size and mediastinal contours. Patchy and confluent perihilar and lung base opacity. No superimposed pneumothorax. No overt edema. No pleural effusion identified. Paucity of bowel gas in the upper abdomen. No osseous abnormality identified. IMPRESSION: 1. Endotracheal tube tip at the level the clavicles. Enteric tube courses to the abdomen, tip not included. 2. Patchy and confluent perihilar and lung base opacity. Consider aspiration. No pulmonary edema or pleural effusion. Electronically Signed   By: Genevie Ann M.D.   On: 03/09/2019 05:45    Consults: Treatment Team:  Murlean Iba, MD Lavella Hammock, MD [facility], [facility], MD   Subjective:    Overnight Issues: Patient developed acute pulmonary edema and had precipitous saturation drop.  Was called on an emergent basis to assist with duration of the patient.  My arrival patient is already being bagged and was sedated.  Objective:  Vital signs for  last 24 hours: Temp:  [98.1 F (36.7 C)-102.5 F (39.2 C)] 99.2 F (37.3 C) (06/11 0449) Pulse Rate:  [84-115] 112 (06/11 0600) Resp:  [12-56] 31 (06/11 0600) BP: (110-155)/(61-77) 155/77 (06/11 0600) SpO2:  [68 %-100 %] 96 % (06/11 0623) FiO2 (%):  [75 %-100 %] 100 % (06/11 0623) Weight:  [97.3 kg-102.3 kg] 97.3 kg (06/11 0459)  Hemodynamic parameters for last 24 hours:    Intake/Output from previous day: 06/10 0701 - 06/11 0700 In: 472.6 [I.V.:79.9; IV Piggyback:392.7] Out: 3284 [Urine:550; Emesis/NG output:225]  Intake/Output this shift: Total I/O In: 292.7 [IV Piggyback:292.7] Out: 250 [Urine:200; Emesis/NG output:50]  Vent settings for last 24 hours: Vent Mode: PRVC FiO2 (%):  [75 %-100 %] 100 % Set Rate:  [28 bmp] 28 bmp Vt Set:  [480 mL] 480 mL PEEP:  [12 cmH20] 12 cmH20 Plateau Pressure:  [27 cmH20] 27 cmH20  Physical Exam:  General: Overweight male, currently being bagged, subsequently intubated HEENT: PERRLA, trachea midline, thick neck cannot appreciate JVD,  Neuro:  Currently sedated Cardiovascular:  Tachycardic r, S1-S2, no murmur regurg or gallop, +2 pulses bilaterally, +2 edema Lungs: Rales throughout.  Abdomen: Distended, soft, diminished to absent bowel sounds.  Musculoskeletal: No joint deformities Skin: Warm and dry, no rash or lesions  Assessment/Plan:   1.  Acute respiratory earlier, hypoxic: This is due to acute pulmonary edema.  The secretions could be seen from airway at the time of intubation.  Patient required intubation and mechanical ventilation.  We will continue to support.  Will ask nephrology to move more fluid during dialysis.  Abdominal distention has also worsened the issue.  However his abdomen is softer than prior.  Suspect currently his issue is mostly volume overload from his renal failure.  Ventilator discomfort will use propofol.  2.  Ileus: Replace electrolytes as needed.  NG tube placed and placed to intermittent wall suction.   Supportive care.  CT abdomen and pelvis showed ileus no other abnormalities.  Avoid narcotics as these can aggravate the issue.  Reglan 4 times a day.  Monitor.  3.  Acute renal failure: Nonoliguric.  Continue Foley catheter due to need for strict INO's.  Follow renal function.  He is  volume overloaded and will required hemodialysis.  Avoid nephrotoxins.  4.  Group B strep pneumonia: Complete antibiotics.  Currently on ceftriaxone.  5.  Nausea and vomiting: Antiemetics this has resolved.     LOS: 10 days   Additional comments: Discussed with Darel Hong, NP  Critical Care Total Time*: 45 Minutes including procedure time  C. Derrill Kay, MD [facility] 03/19/2019  *Care during the described time interval was provided by me and/or other providers on the critical care team.  I have reviewed this patient's available data, including medical history, events of note, physical examination and test results as part of my evaluation.

## 2019-03-19 NOTE — Progress Notes (Signed)
HD Tx completed    03/19/19 2100  Vital Signs  Pulse Rate 83  Resp (!) 25  BP 113/65  BP Location Right Arm  BP Method Automatic  Patient Position (if appropriate) Lying  Oxygen Therapy  SpO2 100 %  O2 Device Ventilator  End Tidal CO2 (EtCO2) 31  During Hemodialysis Assessment  HD Safety Checks Performed Yes  KECN 50.8 KECN  Dialysis Fluid Bolus Normal Saline  Bolus Amount (mL) 250 mL  Intra-Hemodialysis Comments Tx completed

## 2019-03-19 NOTE — Progress Notes (Signed)
Post HD [facility]   03/19/19 2115  Vital Signs  Temp 98.4 F (36.9 C)  Temp Source Axillary  Pulse Rate 84  Pulse Rate Source Monitor  Resp (!) 25  BP (!) 100/56  BP Location Right Arm  BP Method Automatic  Patient Position (if appropriate) Lying  Oxygen Therapy  SpO2 100 %  O2 Device Ventilator  End Tidal CO2 (EtCO2) 31  Pain Assessment  Pain Scale CPOT  Pain Score 0  Post-Hemodialysis Assessment  Rinseback Volume (mL) 250 mL  KECN 50.8 V  Dialyzer Clearance Lightly streaked  Duration of HD Treatment -hour(s) 3 hour(s)  Hemodialysis Intake (mL) 500 mL  UF Total -Machine (mL) 3000 mL  Net UF (mL) 2500 mL  Tolerated HD Treatment Yes  Hemodialysis Catheter Left Internal jugular Double-lumen  Placement Date/Time: 03/09/19 2037   Placed prior to admission: No  Time Out: Correct patient;Correct site;Correct procedure  Maximum sterile barrier precautions: Sterile gown;Mask;Cap;Large sterile sheet;Sterile gloves;Hand hygiene  Site Prep: Chlorh...  Site Condition No complications  Blue Lumen Status Heparin locked  Red Lumen Status Heparin locked  Purple Lumen Status Capped (Central line)  Post treatment catheter status Capped and Clamped

## 2019-03-19 NOTE — Progress Notes (Signed)
Spoke with nursing staff.  Patient is calm.  Patient remains critically ill and required re--intubation and currently on ventilator, after episode of flash pulmonary edema this morning.  Unable to follow-up on effectiveness of mirtazapine ODT 15 mg at bedtime.  Nursing reports patient continues to have depressed affect. Patient's involuntary commitment has expired.  Patient has admitted to suicide attempt, and has been agreeable to inpatient psychiatric admission after medically stabilized. Psychiatry will continue to follow and provide medication management.  Reconsider involuntary commitment once patient medically stabilized if a sitter is required while he is on the medical floor awaiting inpatient psychiatry.  Lavella Hammock, MD

## 2019-03-19 NOTE — Progress Notes (Signed)
Pre HD [facility]   03/19/19 1745  Hand-Off documentation  Report given to (Full Name) Beatris Ship, RN   Report received from (Full Name) Cecille Amsterdam, RN   Vital Signs  Temp 98.7 F (37.1 C)  Temp Source Axillary  Pulse Rate 79  Pulse Rate Source Monitor  Resp 12  BP (!) 99/51  BP Location Right Arm  BP Method Automatic  Patient Position (if appropriate) Lying  Oxygen Therapy  SpO2 100 %  O2 Device Ventilator  End Tidal CO2 (EtCO2) 40  Pain Assessment  Pain Scale CPOT  Pain Score 0  Dialysis Weight  Weight 97.3 kg  Type of Weight Pre-Dialysis  Time-Out for Hemodialysis  What Procedure? HD  Pt Identifiers(min of two) First/Last Name;MRN/Account#  Correct Site? Yes  Correct Side? Yes  Correct Procedure? Yes  Consents Verified? Yes  Rad Studies Available? N/A  Safety Precautions Reviewed? Yes  Engineer, civil (consulting) Number 2  Station Number  (Bedside ICU 13)  UF/Alarm Test Passed  Conductivity: Meter 14  Conductivity: Machine  13.8  pH 7.2  Reverse Osmosis WRO#3  Normal Saline Lot Number [id number]  Dialyzer Lot Number [id number]  Disposable Set Lot Number [id number]  Machine Temperature 98.6 F (37 C)  Musician and Audible Yes  Blood Lines Intact and Secured Yes  Pre Treatment Patient Checks  Vascular access used during treatment Catheter  Hepatitis B Surface Antigen Results Negative  Date Hepatitis B Surface Antigen Drawn 03/17/19  Hepatitis B Surface Antibody 346  Date Hepatitis B Surface Antibody Drawn 03/17/19  Hemodialysis Consent Verified Yes  Hemodialysis Standing Orders Initiated Yes  ECG (Telemetry) Monitor On Yes  Prime Ordered Normal Saline  Length of  DialysisTreatment -hour(s) 3 Hour(s)  Dialysis Treatment Comments Na 140  Dialyzer Elisio 17H NR  Dialysate 3K, 2.5 Ca  Dialysis Anticoagulant None  Dialysate Flow Ordered 800  Blood Flow Rate Ordered 400 mL/min  Ultrafiltration Goal 2.5 Liters  Pre Treatment Labs Phosphorus  Dialysis Blood  Pressure Support Ordered Normal Saline  Education / Care Plan  Dialysis Education Provided No (Comment)  Documented Education in Care Plan  (Pt on vent sedated)  Hemodialysis Catheter Left Internal jugular Double-lumen  Placement Date/Time: 03/09/19 2037   Placed prior to admission: No  Time Out: Correct patient;Correct site;Correct procedure  Maximum sterile barrier precautions: Sterile gown;Mask;Cap;Large sterile sheet;Sterile gloves;Hand hygiene  Site Prep: Chlorh...  Site Condition No complications  Blue Lumen Status Blood return noted  Red Lumen Status Blood return noted  Purple Lumen Status Capped (Central line)  Dressing Status Dry  Interventions Removed;New dressing;Dressing changed  Drainage Description None

## 2019-03-19 NOTE — Progress Notes (Signed)
[facility], [place] 03/19/19  Subjective:  Patient worse over the past 24 hours. Now reintubated. Patient had significant ultrafiltration with dialysis yesterday. Urine output was only 600 cc over the preceding 24 hours.   Objective:  Vital signs in last 24 hours:  Temp:  [98.4 F (36.9 C)-102.5 F (39.2 C)] 98.4 F (36.9 C) (06/11 1149) Pulse Rate:  [77-115] 94 (06/11 1300) Resp:  [12-56] 14 (06/11 1300) BP: (98-155)/(47-77) 125/57 (06/11 1300) SpO2:  [68 %-100 %] 99 % (06/11 1337) FiO2 (%):  [85 %-100 %] 90 % (06/11 1337) Weight:  [97.3 kg] 97.3 kg (06/11 0459)  Weight change: 0 kg Filed Weights   03/18/19 0500 03/18/19 0930 03/19/19 0459  Weight: 102.3 kg 102.3 kg 97.3 kg    Intake/Output:    Intake/Output Summary (Last 24 hours) at 03/19/2019 1459 Last data filed at 03/19/2019 0800 Gross per 24 hour  Intake 472.9 ml  Output 850 ml  Net -377.1 ml   General :        Critically ill-appearing Head:              Endotracheal tube in place Eyes/ENT:      Anicteric Neck:              Supple Lungs:            Scattered rhonchi, vent assisted Heart:             S1S2 no rubs Abdomen:      Soft, non-tender Extremities:   no cyanosis, + dependent edema Skin:               Skin color, texture, turgor normal, no rashes or lesions Neurologic:    intubated, sedated IJ vascath  Basic Metabolic Panel:  Recent Labs  Lab 03/14/19 1602 03/15/19 0434 03/16/19 0341 03/16/19 1438 03/17/19 0536 03/18/19 0350 03/18/19 1209 03/19/19 0541  NA 137 138 134*  --  137 140  --  141  K 3.1* 3.2* 3.4*  --  4.2 4.0  --  4.5  CL 103 104 99  --  99 102  --  99  CO2 24 23 18*  --  19* 21*  --  21*  GLUCOSE 108* 109* 111*  --  132* 122*  --  124*  BUN 37* 48* 74*  --  76* 99*  --  91*  CREATININE 3.02* 4.07* 6.08*  --  5.96* 7.62*  --  6.82*  CALCIUM 8.6* 8.3* 8.5*  --  8.5* 8.0*  --  8.1*  MG  --  2.3 2.5*  --  2.6* 2.8*  --  2.8*  PHOS 2.3*  --   --   5.4* 5.3* 7.9* 5.5*  --      CBC: Recent Labs  Lab 03/15/19 0434 03/16/19 0341 03/17/19 0536 03/18/19 0350 03/19/19 0541  WBC 13.7* 21.8* 18.1* 14.2* 21.6*  NEUTROABS  --  16.2*  --   --  17.6*  HGB 7.1* 7.9* 7.9* 7.2* 7.9*  HCT 21.2* 23.3* 23.7* 21.7* 23.9*  MCV 80.0 80.6 80.6 82.2 82.4  PLT 200 327 364 323 477*      Lab Results  Component Value Date   HEPBSAG Negative 03/17/2019   HEPBIGM Negative 03/17/2019      Microbiology:  Recent Results (from the past 240 hour(s))  CULTURE, BLOOD (ROUTINE X 2) w Reflex to ID Panel     Status: None   Collection Time: 03/09/19  5:00 PM  Specimen: BLOOD RIGHT ARM  Result Value Ref Range Status   Specimen Description   Final    BLOOD RIGHT ARM Performed at [facility], [address] [place], [place] [address]    Special Requests   Final    BOTTLES DRAWN AEROBIC AND ANAEROBIC Blood Culture results may not be optimal due to an excessive volume of blood received in culture bottles Performed at [facility], [address], [place], [place] [address]    Culture   Final    NO GROWTH 5 DAYS Performed at [facility] [address], [place], [place] [address]    Report Status 03/15/2019 FINAL  Final  Urine Culture     Status: None   Collection Time: 03/11/19  4:33 AM   Specimen: Nasal Mucosa; Urine  Result Value Ref Range Status   Specimen Description   Final    URINE, RANDOM Performed at [facility], [address] [place], [place] [address]    Special Requests   Final    NONE Performed at [facility], [address] [place], [place] [address]    Culture   Final    NO GROWTH Performed at [facility], [place] [address], [place], [place] [address]    Report Status 03/12/2019 FINAL  Final    Coagulation Studies: No results for input(s): LABPROT, INR in the last 72 hours.  Urinalysis: No results for input(s): COLORURINE, LABSPEC, PHURINE, GLUCOSEU, HGBUR,  BILIRUBINUR, KETONESUR, PROTEINUR, UROBILINOGEN, NITRITE, LEUKOCYTESUR in the last 72 hours.  Invalid input(s): APPERANCEUR    Imaging: Dg Abd 1 View  Result Date: 03/19/2019 CLINICAL DATA:  Follow-up ileus EXAM: ABDOMEN - 1 VIEW COMPARISON:  03/18/2019 FINDINGS: Gastric catheter is again noted within the stomach. Right femoral central line is noted. Diffuse gaseous distension of the colon is again seen with improved appearance of the small bowel with only a few mildly prominent loops identified. No free air is seen. No bony abnormality is noted. IMPRESSION: Improving ileus with only colonic distension on the current exam. Electronically Signed   By: Inez Catalina M.D.   On: 03/19/2019 08:06   Dg Abd 1 View  Result Date: 03/18/2019 CLINICAL DATA:  Follow-up ileus EXAM: ABDOMEN - 1 VIEW COMPARISON:  Film from the previous day. FINDINGS: Gastric catheter is noted coiled within the stomach. There remains scattered large and small bowel gas. Some progression and small-bowel dilatation is noted when compare with the previous day. Right femoral central line is seen. No bony abnormality is noted. IMPRESSION: Gaseous distension of the large and small bowel with some small bowel dilatation. This would be consistent with a generalized ileus. Correlation with the physical exam is recommended. Electronically Signed   By: Inez Catalina M.D.   On: 03/18/2019 07:31   Dg Chest Port 1 View  Result Date: 03/19/2019 CLINICAL DATA:  Status post intubation EXAM: PORTABLE CHEST 1 VIEW COMPARISON:  03/19/2019 FINDINGS: Endotracheal tube is now seen in satisfactory position. Gastric catheter and left jugular central line are again seen. Cardiac shadow is stable. Stable bilateral infiltrates. IMPRESSION: Endotracheal tube in satisfactory position. The remainder of the exam is stable. Electronically Signed   By: Inez Catalina M.D.   On: 03/19/2019 08:07   Dg Chest Port 1 View  Result Date: 03/19/2019 CLINICAL DATA:  Acute  respiratory failure EXAM: PORTABLE CHEST 1 VIEW COMPARISON:  03/18/2019 FINDINGS: Cardiac shadow is stable. Gastric catheter and left jugular central line are again seen  and stable. Patchy bilateral infiltrates are again identified and stable given some technical variation in the film. No sizable effusion or pneumothorax is noted. No bony abnormality is seen. IMPRESSION: Stable patchy infiltrates bilaterally. Electronically Signed   By: Inez Catalina M.D.   On: 03/19/2019 08:04   Dg Chest Port 1 View  Result Date: 03/18/2019 CLINICAL DATA:  Acute respiratory failure EXAM: PORTABLE CHEST 1 VIEW COMPARISON:  03/17/2019 FINDINGS: Cardiac shadow is stable. Left jugular central line is again seen and stable. Gastric catheter is noted coiled within the stomach. Patchy infiltrates are noted bilaterally stable from the prior exam. No new focal abnormality is noted. IMPRESSION: Stable bilateral infiltrates. Electronically Signed   By: Inez Catalina M.D.   On: 03/18/2019 07:30     Medications:   . sodium chloride Stopped (03/18/19 1430)  . albumin human 12.5 g (03/19/19 0953)  . erythromycin 500 mg (03/19/19 1108)  . fentaNYL infusion INTRAVENOUS 250 mcg/hr (03/19/19 1312)  . piperacillin-tazobactam (ZOSYN)  IV 3.375 g (03/19/19 1441)  . propofol (DIPRIVAN) infusion 25 mcg/kg/min (03/19/19 1112)   . alteplase  2 mg Intracatheter Once  . alteplase  2 mg Intracatheter Once  . aspirin  325 mg Per Tube Daily  . budesonide (PULMICORT) nebulizer solution  0.5 mg Nebulization BID  . chlorhexidine gluconate (MEDLINE KIT)  15 mL Mouth Rinse BID  . Chlorhexidine Gluconate Cloth  6 each Topical Q0600  . heparin injection (subcutaneous)  5,000 Units Subcutaneous Q8H  . ipratropium-albuterol  3 mL Nebulization Q4H  . mouth rinse  15 mL Mouth Rinse 10 times per day  . methylPREDNISolone (SOLU-MEDROL) injection  40 mg Intravenous BID  . mirtazapine  15 mg Oral QHS  . pantoprazole (PROTONIX) IV  40 mg Intravenous  QHS  . rocuronium      . rocuronium  50 mg Intravenous Once  . rocuronium  50 mg Intravenous Once  . sodium chloride flush  10-40 mL Intracatheter Q12H   sodium chloride, bisacodyl, haloperidol lactate, ipratropium-albuterol, LORazepam, morphine injection, nitroGLYCERIN, sodium chloride flush  Assessment/ Plan:  28 y.o.caucasian male with HTN , was admitted on 03/08/2019 with ARF, acute resp failure secondary to drug overdose  1.  Oliguric acute renal failure with hyperkalemia (resolved) and volume overload  likely severe ATN CRRT started 6/1-> 6/6.  HD 6/8.  -Patient with worsening status.  We will plan for additional dialysis treatment today for volume removal.  2.  Acute respiratory failure Extubated 6/4 am, reintubated 6/10 Patient became further short of breath.  He was reintubated yesterday.  Continue current ventilatory support.  3. Metabolic acidosis -Resolved with dialysis.  4. Drug overdose multiple tablets of Phenergan, losartan, amlodipine, clonidine IVC Sitter at bedside  5.  Hypokalemia Potassium normalized at 4.5.     LOS: 10 Daimian Sudberry 6/11/20202:59 PM  [place], [place]  Note: This note was prepared with Dragon dictation. Any transcription errors are unintentional

## 2019-03-19 NOTE — Progress Notes (Signed)
Initial Nutrition Assessment  RD working remotely.  DOCUMENTATION CODES:   Obesity unspecified  INTERVENTION:  Recommend initiating Vital High Protein at 20 mL/hr (480 mL goal daily volume) + Pro-Stat 60 mL TID per tube. Provides 1080 kcal, 132 grams of protein, 403 mL H2O daily. With current propofol rate provides 1465 kcal daily.  Once tube feeds are initiated provide B-complex with C QHS per tube and minimum free water flush of 20-30 mL Q4hrs.  If enteral nutrition unable to be initiated or tolerated in the next few days, consider initiation of TPN.  NUTRITION DIAGNOSIS:   Inadequate oral intake related to inability to eat as evidenced by NPO status.  GOAL:   Provide needs based on ASPEN/SCCM guidelines  MONITOR:   Vent status, Labs, Weight trends, TF tolerance, Skin, I & O's  REASON FOR ASSESSMENT:   Ventilator    ASSESSMENT:   28 year old male with PMHx of IBS, HTN who was admitted on 5/31 for multiple drug overdose, required intubation 5/31-6/4 and required CRRT, transferred out of ICU on 6/7 and now transferred back go ICU on 6/11 after developing acute pulmonary edema requiring emergent intubation.   Patient intubated and sedated. On PRVC mode with FiO2 90% and PEEP 12 cmH2O. Abdomen distended per RN documentation. Last BM 6/7 per chart. Per chart patient was on a diet from 6/5-6/8. He was eating 25-30% of meals. There is concern for ileus. NGT to LIS at this time. Patient was on CRRT from 6/1 to 6/6. Underwent HD on 6/8 and 6/10.  Enteral Access: NGT placed 6/9; terminates in stomach per abdominal x-ray 6/11; 75 cm at left nare  MAP: 62-96 mmHg  Patient is currently intubated on ventilator support Ve: 10.6 L/min Temp (24hrs), Avg:99.7 F (37.6 C), Min:98.1 F (36.7 C), Max:102.5 F (39.2 C)  Propofol: 14.6 mL/hr (385 kcal daily)  Medications reviewed and include: Solu-Medrol 40 mg BID IV, Remeron, pantoprazole, human albumin 12.5 grams BID IV, Unasyn,  erythromycin 500 mg Q8hrs IV, fentanyl gtt, propofol gtt.  Labs reviewed: CBG 170, CO2 21, BUN 91, Creatinine 6.82.  I/O: 600 mL UOP yesterday; 375 mL output from NGT yesterday  NUTRITION - FOCUSED PHYSICAL EXAM:  Unable to complete at this time.  Diet Order:   Diet Order            Diet NPO time specified  Diet effective now             EDUCATION NEEDS:   No education needs have been identified at this time  Skin:  Skin Assessment: Reviewed RN Assessment  Last BM:  03/15/2019 per chart  Height:   Ht Readings from Last 1 Encounters:  03/17/19 5\' 7"  (1.702 m)   Weight:   Wt Readings from Last 1 Encounters:  03/19/19 97.3 kg   Ideal Body Weight:  67.3 kg  BMI:  Body mass index is 33.6 kg/m.  Estimated Nutritional Needs:   Kcal:  5300-5110 (11-14 kcal/kg)  Protein:  135 grams (2 grams/kg IBW)  Fluid:  2 L/day  Willey Blade, MS, RD, LDN Office: [phone] Pager: [phone] After Hours/Weekend Pager: [phone]

## 2019-03-19 NOTE — Progress Notes (Signed)
Spoke with Enzo Montgomery, RN regarding IJ line requested per nephrology.  [facility] does not place IJ lines, [facility] would need to be contacted for that placement.  Cinthia, RN will consult with them.  Carolee Rota, RN [facility]

## 2019-03-19 NOTE — Procedures (Signed)
Endotracheal Intubation: Patient required emergent placement of an artificial airway secondary to acute respiratory failure due to pulmonary edema  Consent: Emergent.   Hand washing performed prior to starting the procedure.   Medications administered for sedation prior to procedure:  Etomidate 40 mg  IV,  Rocuronium 100 mg IV, Fentanyl 100 mcg IV.   A time out procedure was called and correct patient, name, & ID confirmed. Needed supplies and equipment were assembled and checked to include ETT, 10 ml syringe, Glidescope, Mac and Miller blades, suction, oxygen and bag mask valve, end tidal CO2 monitor.   Patient was positioned to align the mouth and pharynx to facilitate visualization of the glottis.  I presented during the time the patient had had a failed attempt by the NP.    Heart rate, SpO2 and blood pressure was continuously monitored during the procedure.  The patient was noted to have significant desaturations due to pulmonary edema.  Initial oxygen saturations in the 40 percentile.  Pre-oxygenation was conducted prior to intubation ever saturations of over 70% could not be achieved due to the degree of pulmonary edema and due to decreased abdominal excursion from distended abdomen.  Utilizing a kaleidoscope with #3 blade the airway was visualized.  Frothy secretions could be seen arising from the airway.  Cricoid pressure was applied.  Airway visualization was fair.  An  endotracheal tube was placed through the vocal cords into the trachea.  7.5  ETT was introduced on the first attempt.  ETT was secured at 24 cm mark.  Placement was confirmed by auscuitation of lungs with good breath sounds bilaterally and no epigastric sounds.  Condensation was noted on endotracheal tube.   Pulse ox 96%.  CO2 detector in place with appropriate color change.   Complications: None .   Operator: Patsey Berthold  Chest radiograph ordered and pending.    Renold Don, MD [facility]

## 2019-03-19 NOTE — Progress Notes (Signed)
Notified by NP Dewaine Conger that Dr. Holley Raring is agreeable to PICC line; per Dr. Holley Raring he prefers patient to have IJ PICC.

## 2019-03-19 NOTE — Progress Notes (Signed)
HD Tx started    03/19/19 1800  Vital Signs  Pulse Rate 81  Pulse Rate Source Monitor  Resp 10  BP (!) 112/52  BP Location Right Arm  BP Method Automatic  Patient Position (if appropriate) Lying  Oxygen Therapy  SpO2 100 %  O2 Device Ventilator  End Tidal CO2 (EtCO2) 39  During Hemodialysis Assessment  Blood Flow Rate (mL/min) 400 mL/min  Arterial Pressure (mmHg) -150 mmHg  Venous Pressure (mmHg) 140 mmHg  Transmembrane Pressure (mmHg) 80 mmHg  Ultrafiltration Rate (mL/min) 1000 mL/min  Dialysate Flow Rate (mL/min) 800 ml/min  Conductivity: Machine  14.1  HD Safety Checks Performed Yes  Dialysis Fluid Bolus Normal Saline  Bolus Amount (mL) 250 mL  Intra-Hemodialysis Comments Tx initiated

## 2019-03-20 ENCOUNTER — Inpatient Hospital Stay: Payer: BC Managed Care – PPO

## 2019-03-20 LAB — BASIC METABOLIC PANEL
Anion gap: 14 (ref 5–15)
BUN: 78 mg/dL — ABNORMAL HIGH (ref 6–20)
CO2: 25 mmol/L (ref 22–32)
Calcium: 7.3 mg/dL — ABNORMAL LOW (ref 8.9–10.3)
Chloride: 101 mmol/L (ref 98–111)
Creatinine, Ser: 6.2 mg/dL — ABNORMAL HIGH (ref 0.61–1.24)
GFR calc Af Amer: 13 mL/min — ABNORMAL LOW (ref >60)
GFR calc non Af Amer: 11 mL/min — ABNORMAL LOW (ref >60)
Glucose, Bld: 167 mg/dL — ABNORMAL HIGH (ref 70–99)
Potassium: 4.5 mmol/L (ref 3.5–5.1)
Sodium: 140 mmol/L (ref 135–145)

## 2019-03-20 LAB — TRIGLYCERIDES: Triglycerides: 149 mg/dL (ref <150)

## 2019-03-20 LAB — CBC WITH DIFFERENTIAL/PLATELET
Abs Immature Granulocytes: 0.14 10*3/uL — ABNORMAL HIGH (ref 0.00–0.07)
Basophils Absolute: 0 10*3/uL (ref 0.0–0.1)
Basophils Relative: 0 %
Eosinophils Absolute: 0 10*3/uL (ref 0.0–0.5)
Eosinophils Relative: 0 %
HCT: 18.9 % — ABNORMAL LOW (ref 39.0–52.0)
Hemoglobin: 6 g/dL — ABNORMAL LOW (ref 13.0–17.0)
Immature Granulocytes: 1 %
Lymphocytes Relative: 2 %
Lymphs Abs: 0.3 10*3/uL — ABNORMAL LOW (ref 0.7–4.0)
MCH: 27.1 pg (ref 26.0–34.0)
MCHC: 31.7 g/dL (ref 30.0–36.0)
MCV: 85.5 fL (ref 80.0–100.0)
Monocytes Absolute: 0.7 10*3/uL (ref 0.1–1.0)
Monocytes Relative: 5 %
Neutro Abs: 13.4 10*3/uL — ABNORMAL HIGH (ref 1.7–7.7)
Neutrophils Relative %: 92 %
Platelets: 333 10*3/uL (ref 150–400)
RBC: 2.21 MIL/uL — ABNORMAL LOW (ref 4.22–5.81)
RDW: 13.5 % (ref 11.5–15.5)
WBC: 14.5 10*3/uL — ABNORMAL HIGH (ref 4.0–10.5)
nRBC: 0 % (ref 0.0–0.2)

## 2019-03-20 LAB — MAGNESIUM: Magnesium: 2.7 mg/dL — ABNORMAL HIGH (ref 1.7–2.4)

## 2019-03-20 LAB — GLUCOSE, CAPILLARY
Glucose-Capillary: 150 mg/dL — ABNORMAL HIGH (ref 70–99)
Glucose-Capillary: 169 mg/dL — ABNORMAL HIGH (ref 70–99)
Glucose-Capillary: 177 mg/dL — ABNORMAL HIGH (ref 70–99)

## 2019-03-20 LAB — PREPARE RBC (CROSSMATCH)

## 2019-03-20 LAB — ABO/RH: ABO/RH(D): A POS

## 2019-03-20 MED ORDER — PRO-STAT SUGAR FREE PO LIQD
60.0000 mL | Freq: Three times a day (TID) | ORAL | Status: DC
  Administered 2019-03-21 – 2019-03-22 (×5): 60 mL

## 2019-03-20 MED ORDER — INSULIN GLARGINE 100 UNIT/ML ~~LOC~~ SOLN: 20.0000 [IU] | Freq: Two times a day (BID) | SUBCUTANEOUS | Status: DC

## 2019-03-20 MED ORDER — SODIUM CHLORIDE 0.9% IV SOLUTION: Freq: Once | INTRAVENOUS | Status: DC

## 2019-03-20 MED ORDER — B COMPLEX-C PO TABS
1.0000 | ORAL_TABLET | Freq: Every day | ORAL | Status: DC
  Administered 2019-03-20 – 2019-03-22 (×3): 1
  Filled 2019-03-20 (×5): qty 1

## 2019-03-20 MED ORDER — VITAL HIGH PROTEIN PO LIQD
1000.0000 mL | ORAL | Status: DC
  Administered 2019-03-20 – 2019-03-21 (×2): 1000 mL

## 2019-03-20 MED ORDER — SODIUM CHLORIDE 0.9% FLUSH
10.0000 mL | Freq: Two times a day (BID) | INTRAVENOUS | Status: DC
  Administered 2019-03-20 – 2019-03-21 (×3): 10 mL
  Administered 2019-03-22: 20 mL
  Administered 2019-03-23 – 2019-03-29 (×8): 10 mL
  Administered 2019-03-29: 3 mL
  Administered 2019-03-29: 10 mL

## 2019-03-20 MED ORDER — SODIUM CHLORIDE 0.9% FLUSH
10.0000 mL | INTRAVENOUS | Status: DC | PRN
  Administered 2019-03-27: 10 mL
  Filled 2019-03-20: qty 40

## 2019-03-20 MED ORDER — ADULT MULTIVITAMIN LIQUID CH
15.0000 mL | Freq: Every day | ORAL | Status: DC
  Administered 2019-03-21 – 2019-03-22 (×2): 15 mL
  Filled 2019-03-20 (×4): qty 15

## 2019-03-20 MED ORDER — INSULIN ASPART 100 UNIT/ML ~~LOC~~ SOLN
0.0000 [IU] | Freq: Four times a day (QID) | SUBCUTANEOUS | Status: DC
  Administered 2019-03-20 – 2019-03-21 (×3): 2 [IU] via SUBCUTANEOUS
  Administered 2019-03-21: 1 [IU] via SUBCUTANEOUS
  Administered 2019-03-21 – 2019-03-22 (×3): 2 [IU] via SUBCUTANEOUS
  Administered 2019-03-22: 1 [IU] via SUBCUTANEOUS
  Administered 2019-03-22: 2 [IU] via SUBCUTANEOUS
  Administered 2019-03-22 – 2019-03-23 (×3): 1 [IU] via SUBCUTANEOUS
  Administered 2019-03-25: 2 [IU] via SUBCUTANEOUS
  Administered 2019-03-25: 3 [IU] via SUBCUTANEOUS
  Administered 2019-03-25 – 2019-03-26 (×2): 1 [IU] via SUBCUTANEOUS
  Filled 2019-03-20 (×14): qty 1

## 2019-03-20 MED ORDER — POLYETHYLENE GLYCOL 3350 17 G PO PACK
17.0000 g | PACK | Freq: Every day | ORAL | Status: DC
  Administered 2019-03-20 – 2019-03-22 (×3): 17 g via ORAL
  Filled 2019-03-20 (×2): qty 1

## 2019-03-20 NOTE — Progress Notes (Addendum)
Patient remains on the vent at this time, critical condition.  Agreeable to inpatient psychiatry after medical clearance.  Reconsider involuntary commitment when patient medically stabilized if a sitter is required while he I son the medical floor awaiting inpatient psychiatry, per Dr Leverne Humbles.  Presently, psychiatry is on stand-by until he is more medically stable and can converse.  Waylan Boga, PMHNP

## 2019-03-20 NOTE — Progress Notes (Signed)
[place], [place] 03/20/19  Subjective:  Patient remains critically ill. Urine output was only 240 cc over the preceding 24 hours. Ultrafiltration achieved was 2.5 kg yesterday.   Objective:  Vital signs in last 24 hours:  Temp:  [98.2 F (36.8 C)-98.7 F (37.1 C)] 98.2 F (36.8 C) (06/12 1230) Pulse Rate:  [79-90] 82 (06/12 1230) Resp:  [10-28] 28 (06/12 1230) BP: (91-119)/(41-84) 91/43 (06/12 1230) SpO2:  [95 %-100 %] 95 % (06/12 1230) FiO2 (%):  [40 %-90 %] 40 % (06/12 1221) Weight:  [95.9 kg-97.3 kg] 95.9 kg (06/12 0251)  Weight change: -5 kg Filed Weights   03/19/19 0459 03/19/19 1745 03/20/19 0251  Weight: 97.3 kg 97.3 kg 95.9 kg    Intake/Output:    Intake/Output Summary (Last 24 hours) at 03/20/2019 1354 Last data filed at 03/20/2019 0520 Gross per 24 hour  Intake 1405.31 ml  Output 2890 ml  Net -1484.69 ml   General :        Critically ill-appearing Head:              Endotracheal tube in place Eyes/ENT:      Anicteric Neck:              Supple Lungs:            Scattered rhonchi, vent assisted Heart:             S1S2 no rubs Abdomen:      Soft, non-tender Extremities:   no cyanosis, + dependent edema Skin:               Skin color, texture, turgor normal, no rashes or lesions Neurologic:    intubated, sedated IJ vascath  Basic Metabolic Panel:  Recent Labs  Lab 03/16/19 0341 03/16/19 1438 03/17/19 0536 03/18/19 0350 03/18/19 1209 03/19/19 0541 03/19/19 1930 03/20/19 0343  NA 134*  --  137 140  --  141  --  140  K 3.4*  --  4.2 4.0  --  4.5  --  4.5  CL 99  --  99 102  --  99  --  101  CO2 18*  --  19* 21*  --  21*  --  25  GLUCOSE 111*  --  132* 122*  --  124*  --  167*  BUN 74*  --  76* 99*  --  91*  --  78*  CREATININE 6.08*  --  5.96* 7.62*  --  6.82*  --  6.20*  CALCIUM 8.5*  --  8.5* 8.0*  --  8.1*  --  7.3*  MG 2.5*  --  2.6* 2.8*  --  2.8*  --  2.7*  PHOS  --  5.4* 5.3* 7.9* 5.5*  --  9.0*  --       CBC: Recent Labs  Lab 03/16/19 0341 03/17/19 0536 03/18/19 0350 03/19/19 0541 03/20/19 0343  WBC 21.8* 18.1* 14.2* 21.6* 14.5*  NEUTROABS 16.2*  --   --  17.6* 13.4*  HGB 7.9* 7.9* 7.2* 7.9* 6.0*  HCT 23.3* 23.7* 21.7* 23.9* 18.9*  MCV 80.6 80.6 82.2 82.4 85.5  PLT 327 364 323 477* 333      Lab Results  Component Value Date   HEPBSAG Negative 03/17/2019   HEPBIGM Negative 03/17/2019      Microbiology:  Recent Results (from the past 240 hour(s))  Urine Culture     Status: None   Collection Time: 03/11/19  4:33 AM  Specimen: Nasal Mucosa; Urine  Result Value Ref Range Status   Specimen Description   Final    URINE, RANDOM Performed at [facility], [address] [place], [place] [address]    Special Requests   Final    NONE Performed at [facility], [address] [place], [place] [address]    Culture   Final    NO GROWTH Performed at [facility], [place] [address] [place], [place] [address]    Report Status 03/12/2019 FINAL  Final    Coagulation Studies: No results for input(s): LABPROT, INR in the last 72 hours.  Urinalysis: No results for input(s): COLORURINE, LABSPEC, PHURINE, GLUCOSEU, HGBUR, BILIRUBINUR, KETONESUR, PROTEINUR, UROBILINOGEN, NITRITE, LEUKOCYTESUR in the last 72 hours.  Invalid input(s): APPERANCEUR    Imaging: Dg Abd 1 View  Result Date: 03/19/2019 CLINICAL DATA:  Follow-up ileus EXAM: ABDOMEN - 1 VIEW COMPARISON:  03/18/2019 FINDINGS: Gastric catheter is again noted within the stomach. Right femoral central line is noted. Diffuse gaseous distension of the colon is again seen with improved appearance of the small bowel with only a few mildly prominent loops identified. No free air is seen. No bony abnormality is noted. IMPRESSION: Improving ileus with only colonic distension on the current exam. Electronically Signed   By: Inez Catalina M.D.   On: 03/19/2019 08:06   Dg Chest Port 1 View  Result  Date: 03/20/2019 CLINICAL DATA:  Central line placement EXAM: PORTABLE CHEST 1 VIEW COMPARISON:  03/19/2019 FINDINGS: There is a newly placed right IJ central venous catheter with tip projecting over the proximal SVC. The left-sided central venous catheter is stable in positioning. The endotracheal tube terminates above the carina. The enteric tube extends below the left hemidiaphragm. There are diffuse bilateral hazy airspace opacities with no significant interval improvement. No pneumothorax. The heart size is stable. IMPRESSION: 1. Newly placed right-sided central venous catheter tip terminates over the proximal SVC. There is no pneumothorax. The remaining lines and tubes are as above. 2. Otherwise, no significant interval change in appearance of the lungs. Electronically Signed   By: Constance Holster M.D.   On: 03/20/2019 01:19   Dg Chest Port 1 View  Result Date: 03/19/2019 CLINICAL DATA:  Status post intubation EXAM: PORTABLE CHEST 1 VIEW COMPARISON:  03/19/2019 FINDINGS: Endotracheal tube is now seen in satisfactory position. Gastric catheter and left jugular central line are again seen. Cardiac shadow is stable. Stable bilateral infiltrates. IMPRESSION: Endotracheal tube in satisfactory position. The remainder of the exam is stable. Electronically Signed   By: Inez Catalina M.D.   On: 03/19/2019 08:07   Dg Chest Port 1 View  Result Date: 03/19/2019 CLINICAL DATA:  Acute respiratory failure EXAM: PORTABLE CHEST 1 VIEW COMPARISON:  03/18/2019 FINDINGS: Cardiac shadow is stable. Gastric catheter and left jugular central line are again seen and stable. Patchy bilateral infiltrates are again identified and stable given some technical variation in the film. No sizable effusion or pneumothorax is noted. No bony abnormality is seen. IMPRESSION: Stable patchy infiltrates bilaterally. Electronically Signed   By: Inez Catalina M.D.   On: 03/19/2019 08:04   [place] Ekg Site Rite  Result Date: 03/19/2019 If Site  Rite image not attached, placement could not be confirmed due to current cardiac rhythm.    Medications:   . sodium chloride Stopped (03/18/19 1430)  . albumin human Stopped (03/19/19 2248)  . erythromycin Stopped (03/20/19 0331)  . fentaNYL infusion INTRAVENOUS 100 mcg/hr (03/20/19 0520)  .  midazolam 2 mg/hr (03/20/19 0520)  . piperacillin-tazobactam (ZOSYN)  IV Stopped (03/20/19 0202)  . propofol (DIPRIVAN) infusion 20 mcg/kg/min (03/20/19 0520)   . sodium chloride   Intravenous Once  . alteplase  2 mg Intracatheter Once  . alteplase  2 mg Intracatheter Once  . aspirin  325 mg Per Tube Daily  . budesonide (PULMICORT) nebulizer solution  0.5 mg Nebulization BID  . chlorhexidine gluconate (MEDLINE KIT)  15 mL Mouth Rinse BID  . Chlorhexidine Gluconate Cloth  6 each Topical Q0600  . heparin injection (subcutaneous)  5,000 Units Subcutaneous Q8H  . insulin aspart  0-9 Units Subcutaneous Q6H  . ipratropium-albuterol  3 mL Nebulization Q4H  . mouth rinse  15 mL Mouth Rinse 10 times per day  . methylPREDNISolone (SOLU-MEDROL) injection  40 mg Intravenous BID  . mirtazapine  15 mg Oral QHS  . pantoprazole (PROTONIX) IV  40 mg Intravenous QHS  . polyethylene glycol  17 g Oral Daily  . rocuronium  50 mg Intravenous Once  . rocuronium  50 mg Intravenous Once  . sodium chloride flush  10-40 mL Intracatheter Q12H  . sodium chloride flush  10-40 mL Intracatheter Q12H   sodium chloride, bisacodyl, haloperidol lactate, ipratropium-albuterol, LORazepam, nitroGLYCERIN, sodium chloride flush, sodium chloride flush  Assessment/ Plan:  28 y.o.caucasian male with HTN , was admitted on 03/08/2019 with ARF, acute resp failure secondary to drug overdose  1.  Oliguric acute renal failure with hyperkalemia (resolved) and volume overload  likely severe ATN CRRT started 6/1-> 6/6.  HD 6/8.  -Patient underwent hemodialysis yesterday.  Tolerated well.  Ultrafiltration achieved was 2.5 kg.  We will plan  for additional dialysis treatment tomorrow if the patient remains oliguric.  2.  Acute respiratory failure Extubated 6/4 am, reintubated 6/10 Still on the ventilator.  Continue ultrafiltration with dialysis tomorrow.  3. Metabolic acidosis -Resolved with dialysis.  4. Drug overdose multiple tablets of Phenergan, losartan, amlodipine, clonidine IVC   5.  Hypokalemia Potassium remains in the normal range at 4.5 at the moment.     LOS: 11 Eupha Lobb 6/12/20201:54 PM  [place], [place]  Note: This note was prepared with Dragon dictation. Any transcription errors are unintentional

## 2019-03-20 NOTE — Progress Notes (Signed)
CRITICAL CARE NOTE  CC  follow up respiratory failure  SUBJECTIVE Patient remains critically ill Prognosis is guarded Remains on vent Severe hypoxia fio2 at 75%  Vent Mode: PRVC FiO2 (%):  [65 %-90 %] 65 % Set Rate:  [22 bmp-28 bmp] 28 bmp Vt Set:  [480 mL-530 mL] 530 mL PEEP:  [10 cmH20-12 cmH20] 10 cmH20 Pressure Support:  [5 cmH20] 5 cmH20 Plateau Pressure:  [26 cmH20-30 cmH20] 30 cmH20    BP (!) 93/42   Pulse 86   Temp 98.4 F (36.9 C) (Axillary)   Resp (!) 28   Ht 5\' 7"  (1.702 m)   Wt 95.9 kg   SpO2 99%   BMI 33.11 kg/m    I/O last 3 completed shifts: In: 1728 [I.V.:1006.2; NG/GT:60; IV Piggyback:661.8] Out: 0932 [Urine:490; Emesis/NG output:350; Other:2500] No intake/output data recorded.  SpO2: 99 % O2 Flow Rate (L/min): 60 L/min FiO2 (%): 65 %   SIGNIFICANT EVENTS 5/31 admitted for severe shock and resp failure DRUG OD 6/1 started CRRT multiorgan failure, resp failure, vasopressors 6/2 severe hypoxia UF rates increased to 500 6/3 DNR, multiorgan failure 6/4 CODE STATUS changed to full code,high risk for reintubation severe hypoxia on CRRT 6/5 high flow [place], on CRRT, high risk for intubation 6/7 resp failure resolved, off CRRT, transfer to gen med floor 6/10 transferred back to ICU for severe resp failure +aspiration of gastric contents 6/11 intubated for severe ARDS, severe hypoxia, NG and flexi seal placed 6/12 severe hypoxia on vent, NEW RT IJ PLACED, RT FEM CVL REMOVED  REVIEW OF SYSTEMS  PATIENT IS UNABLE TO PROVIDE COMPLETE REVIEW OF SYSTEMS DUE TO SEVERE CRITICAL ILLNESS   PHYSICAL EXAMINATION:  GENERAL:critically ill appearing, +resp distress HEAD: Normocephalic, atraumatic.  EYES: Pupils equal, round, reactive to light.  No scleral icterus.  MOUTH: Moist mucosal membrane. NECK: Supple. No thyromegaly. No nodules. No JVD.  PULMONARY: +rhonchi, +wheezing CARDIOVASCULAR: S1 and S2. Regular rate and rhythm. No murmurs, rubs, or gallops.   GASTROINTESTINAL: Soft, nontender, -distended. No masses. Positive bowel sounds. No hepatosplenomegaly.  MUSCULOSKELETAL: No swelling, clubbing, or edema.  NEUROLOGIC: obtunded, GCS<8 SKIN:intact,warm,dry  MEDICATIONS: I have reviewed all medications and confirmed regimen as documented   CULTURE RESULTS   Recent Results (from the past 240 hour(s))  Urine Culture     Status: None   Collection Time: 03/11/19  4:33 AM   Specimen: Nasal Mucosa; Urine  Result Value Ref Range Status   Specimen Description   Final    URINE, RANDOM Performed at [facility], [address] [place], [place] [address]    Special Requests   Final    NONE Performed at [facility], [address], [place], [place] [address]    Culture   Final    NO GROWTH Performed at [facility], [place] [address], [place], [place] [address]    Report Status 03/12/2019 FINAL  Final          IMAGING    Dg Chest Port 1 View  Result Date: 03/20/2019 CLINICAL DATA:  Central line placement EXAM: PORTABLE CHEST 1 VIEW COMPARISON:  03/19/2019 FINDINGS: There is a newly placed right IJ central venous catheter with tip projecting over the proximal SVC. The left-sided central venous catheter is stable in positioning. The endotracheal tube terminates above the carina. The enteric tube extends below the left hemidiaphragm. There are diffuse bilateral hazy airspace opacities with no significant interval improvement. No pneumothorax. The heart size is stable. IMPRESSION: 1.  Newly placed right-sided central venous catheter tip terminates over the proximal SVC. There is no pneumothorax. The remaining lines and tubes are as above. 2. Otherwise, no significant interval change in appearance of the lungs. Electronically Signed   By: Constance Holster M.D.   On: 03/20/2019 01:19   [place] Ekg Site Rite  Result Date: 03/19/2019 If Site Rite image not attached, placement could not be confirmed due to current cardiac  rhythm.      Indwelling Urinary Catheter continued, requirement due to   Reason to continue Indwelling Urinary Catheter strict Intake/Output monitoring for hemodynamic instability   Central Line/ continued, requirement due to  Reason to continue [place] of central venous pressure or other hemodynamic parameters and poor IV access   Ventilator continued, requirement due to severe respiratory failure   Ventilator Sedation RASS 0 to -2      ASSESSMENT AND PLAN SYNOPSIS  28 year old white male admitted to the ICU for acute drug overdose and toxicity from calcium channel blocker in the setting of multiorgan failure with severehypoxicrespiratory failure,distributive shock and renal failure with metabolic encephalopathy extubated and re-admitted to ICU for severe aspiration pneumonia and ARDS  Severe ACUTE Hypoxic and Hypercapnic Respiratory Failure ARDS -continue Full MV support -continue Bronchodilator Therapy -Wean Fio2 and PEEP as tolerated Unable to wean due to severe hypoxia  ACUTE KIDNEY INJURY/Renal Failure -follow chem 7 -follow UO -continue Foley Catheter-assess need -Avoid nephrotoxic agents -Recheck creatinine  HD as needed   NEUROLOGY - intubated and sedated - minimal sedation to achieve a RASS goal: -1  CARDIAC ICU monitoring  ID -continue IV abx as prescibed -follow up cultures  GI +ileus GI PROPHYLAXIS as indicated NG and flexi seal for decompression  NUTRITIONAL STATUS DIET-->NPO Constipation protocol as indicated  ENDO - will use ICU hypoglycemic\Hyperglycemia protocol if indicated   ELECTROLYTES -follow labs as needed -replace as needed -pharmacy consultation and following   DVT/GI PRX ordered TRANSFUSIONS AS NEEDED MONITOR FSBS ASSESS the need for LABS as needed   Critical Care Time devoted to patient care services described in this note is 34 minutes.   Overall, patient is critically ill, prognosis is  guarded.  Patient with Multiorgan failure and at high risk for cardiac arrest and death.    Corrin Parker, M.D.  [facility]  Medical Director [place] Director [facility] Cardio-Pulmonary Department

## 2019-03-20 NOTE — Progress Notes (Signed)
[place] [facility] [place] NAME: Glenn Rasmussen    MR#:  [id number]  DATE OF BIRTH:  Nov 12, 1990  SUBJECTIVE:   Patient remains critically ill intubated on the ventilator. Had dialysis yesterday.  REVIEW OF SYSTEMS:  Review of Systems  Constitutional: Negative for chills, fever and weight loss.  HENT: Negative for ear discharge, ear pain and nosebleeds.   Eyes: Negative for blurred vision, pain and discharge.  Respiratory: Positive for shortness of breath. Negative for sputum production, wheezing and stridor.   Cardiovascular: Negative for palpitations, orthopnea and PND.  Gastrointestinal: Negative for abdominal pain, diarrhea, nausea and vomiting.  Genitourinary: Negative for frequency and urgency.  Musculoskeletal: Negative for back pain and joint pain.  Neurological: Positive for weakness. Negative for sensory change, speech change and focal weakness.  Psychiatric/Behavioral: Negative for depression and hallucinations. The patient is nervous/anxious.     DRUG ALLERGIES:  No Known Allergies  VITALS:  Blood pressure (!) 91/43, pulse 82, temperature 98.2 F (36.8 C), temperature source Axillary, resp. rate (!) 28, height 5\' 7"  (1.702 m), weight 95.9 kg, SpO2 95 %.  PHYSICAL EXAMINATION:  Physical Exam   GENERAL:  28 y.o.-year-old critically ill-appearing patient lying in the bed with  No acute distress. obese EYES: Pupils equal, round, reactive to light and accommodation. No scleral icterus. Extraocular muscles intact. On the ventilator HEENT: Head atraumatic, normocephalic. Oropharynx and nasopharynx clear. NG+ NECK:  Supple, no jugular venous distention. No thyroid enlargement, no tenderness.   LUNGS: distant breath sounds bilaterally, no wheezing Mild use of accessory muscles of respiration.  Decreased bibasilar breath sounds CARDIOVASCULAR: S1, S2 normal. No murmurs, rubs, or gallops.  ABDOMEN: Soft, nontender, nondistended. Bowel  sounds present. No organomegaly or mass.  EXTREMITIES: No  cyanosis, or clubbing.  1+ pedal edema noted NEUROLOGIC: Patient sedated    PSYCHIATRIC: on the vent SKIN: No obvious rash, lesion, or ulcer.    LABORATORY PANEL:   CBC Recent Labs  Lab 03/20/19 0343  WBC 14.5*  HGB 6.0*  HCT 18.9*  PLT 333   ------------------------------------------------------------------------------------------------------------------  Chemistries  Recent Labs  Lab 03/17/19 0536  03/20/19 0343  NA 137   < > 140  K 4.2   < > 4.5  CL 99   < > 101  CO2 19*   < > 25  GLUCOSE 132*   < > 167*  BUN 76*   < > 78*  CREATININE 5.96*   < > 6.20*  CALCIUM 8.5*   < > 7.3*  MG 2.6*   < > 2.7*  AST 58*  --   --   ALT 50*  --   --   ALKPHOS 53  --   --   BILITOT 0.8  --   --    < > = values in this interval not displayed.   ------------------------------------------------------------------------------------------------------------------  Cardiac Enzymes Recent Labs  Lab 03/16/19 1634  TROPONINI 0.26*   ------------------------------------------------------------------------------------------------------------------  RADIOLOGY:  Dg Abd 1 View  Result Date: 03/19/2019 CLINICAL DATA:  Follow-up ileus EXAM: ABDOMEN - 1 VIEW COMPARISON:  03/18/2019 FINDINGS: Gastric catheter is again noted within the stomach. Right femoral central line is noted. Diffuse gaseous distension of the colon is again seen with improved appearance of the small bowel with only a few mildly prominent loops identified. No free air is seen. No bony abnormality is noted. IMPRESSION: Improving ileus with only colonic distension on the current exam. Electronically Signed   By: Elta Guadeloupe  Lukens M.D.   On: 03/19/2019 08:06   Dg Chest Port 1 View  Result Date: 03/20/2019 CLINICAL DATA:  Central line placement EXAM: PORTABLE CHEST 1 VIEW COMPARISON:  03/19/2019 FINDINGS: There is a newly placed right IJ central venous catheter with tip  projecting over the proximal SVC. The left-sided central venous catheter is stable in positioning. The endotracheal tube terminates above the carina. The enteric tube extends below the left hemidiaphragm. There are diffuse bilateral hazy airspace opacities with no significant interval improvement. No pneumothorax. The heart size is stable. IMPRESSION: 1. Newly placed right-sided central venous catheter tip terminates over the proximal SVC. There is no pneumothorax. The remaining lines and tubes are as above. 2. Otherwise, no significant interval change in appearance of the lungs. Electronically Signed   By: Constance Holster M.D.   On: 03/20/2019 01:19   Dg Chest Port 1 View  Result Date: 03/19/2019 CLINICAL DATA:  Status post intubation EXAM: PORTABLE CHEST 1 VIEW COMPARISON:  03/19/2019 FINDINGS: Endotracheal tube is now seen in satisfactory position. Gastric catheter and left jugular central line are again seen. Cardiac shadow is stable. Stable bilateral infiltrates. IMPRESSION: Endotracheal tube in satisfactory position. The remainder of the exam is stable. Electronically Signed   By: Inez Catalina M.D.   On: 03/19/2019 08:07   Dg Chest Port 1 View  Result Date: 03/19/2019 CLINICAL DATA:  Acute respiratory failure EXAM: PORTABLE CHEST 1 VIEW COMPARISON:  03/18/2019 FINDINGS: Cardiac shadow is stable. Gastric catheter and left jugular central line are again seen and stable. Patchy bilateral infiltrates are again identified and stable given some technical variation in the film. No sizable effusion or pneumothorax is noted. No bony abnormality is seen. IMPRESSION: Stable patchy infiltrates bilaterally. Electronically Signed   By: Inez Catalina M.D.   On: 03/19/2019 08:04   [place] Ekg Site Rite  Result Date: 03/19/2019 If Site Rite image not attached, placement could not be confirmed due to current cardiac rhythm.   EKG:   Orders placed or performed during the hospital encounter of 03/08/19  . ED EKG   . ED EKG  . ED EKG  . ED EKG  . EKG 12-Lead  . EKG 12-Lead  . EKG 12-Lead  . EKG 12-Lead  . EKG 12-Lead  . EKG 12-Lead  . EKG 12-Lead  . EKG 12-Lead  . EKG 12-Lead  . EKG 12-Lead  . EKG 12-Lead  . EKG 12-Lead  . EKG 12-Lead  . EKG 12-Lead  . EKG - 12 lead  . EKG - 12 lead  . EKG 12-Lead  . EKG 12-Lead    ASSESSMENT AND PLAN:   28 year old male with past medical history significant for hypertension, IBS admitted secondary to overdose on Phenergan, losartan, Norvasc and Klonopin  1.  Acute Hypoxic respiratory failure due to volume overload/?ARDS -Patient is extubated on 03/12/2019--now re-intubated on 03/19/2019 - Patient went into respiratory distress now re-intubated. -EKG--sinus tachycardia -VQ scan low probability for PE. D/ced heparin drip. -Continue hemodialysis with ultrafiltration, IV Solu-Medrol, empiric IV Unasyn  2.Intentional drug overdose -patient overdosed on 38 tablets of Phenergan, 19 tablets of losartan, 42 tablets of Norvasc and 78 tablets of Klonopin -Urine tox positive for benzos and tricyclics -Appreciate psych consult.  Once patient is medically stable, will need inpatient psychiatry admission.  3.  Acute renal failure-likely ATN from hypotension.  -received on CRRT, metabolic acidosis.  Appreciate nephrology consult - CRRTon hold for now-- now on hemodialysis with UF  4. Pneumonia-follow-up procalcitonin and WBC. -  Currently on Iv unasyn -wbc 21K--18K  5.  DVT prophylaxis- on heparin SQ  6.  GERD-Protonix  7.  Elevated troponin-likely demand ischemia, troponin plateaued vs due to renal failure -  Most recent echo this admission showing normal LV function and no wall motion abnormalities.  8.Anemia appears due to current illness with renal failure No active bleed   D/w dr Mortimer Fries  CODE STATUS: Full code  TOTAL TIME TAKING CARE OF THIS PATIENT: 35 minutes.   POSSIBLE D/C IN ? DAYS, DEPENDING ON CLINICAL CONDITION.  Overall long-term  prognosis poor  Fritzi Mandes M.D on 03/20/2019 at 3:30 PM  Between 7am to 6pm - Pager - [phone]  After 6pm go to [email or web address] - password [facility]  Office  [phone]  CC: Primary care physician; Maeola Sarah, MD

## 2019-03-20 NOTE — TOC Transition Note (Signed)
Transition of Care [facility]) - CM/SW Discharge Note   Patient Details  Name: TIP ATIENZA MRN: [id number] Date of Birth: 27-Sep-1991  [facility] CM/SW Contact:  Shela Leff, LCSW Phone Number: 03/20/2019, 1:39 PM   Clinical Narrative:   Patient is beginning to wake up and following some commands. Currently on ventilator. CSW will continue to follow patient.       Barriers to Discharge: Continued Medical Work up   Patient Goals and CMS Choice Patient states their goals for this hospitalization and ongoing recovery are:: Get better and get to go home      Discharge Placement                       Discharge Plan and Services   Discharge Planning Services: CM Consult                                 Social Determinants of Health (SDOH) Interventions     Readmission Risk Interventions No flowsheet data found.

## 2019-03-20 NOTE — Progress Notes (Signed)
Spoke with patient's RN Cinthia regarding the need for PICC line placement. PICC line order has been discontinued since a R IJ TL CL has already been established by the ICU NP at 03/20/2019 at 0053 . Also per conversation with RN, the R Femoral CVC has also already been pulled out.

## 2019-03-20 NOTE — Procedures (Signed)
Central Venous Catheter Insertion Procedure Note Glenn Rasmussen [id number] 1991/01/06  Procedure: Insertion of Central Venous Catheter Indications: Assessment of intravascular volume, Drug and/or fluid administration and Frequent blood sampling  Procedure Details Consent: Unable to obtain consent because of altered level of consciousness. Time Out: Verified patient identification, verified procedure, site/side was marked, verified correct patient position, special equipment/implants available, medications/allergies/relevent history reviewed, required imaging and test results available.  Performed  Maximum sterile technique was used including antiseptics, cap, gloves, gown, hand hygiene, mask and sheet. Skin prep: Chlorhexidine; local anesthetic administered A antimicrobial bonded/coated triple lumen catheter was placed in the right internal jugular vein using the Seldinger technique.  Evaluation Blood flow good Complications: No apparent complications Patient did tolerate procedure well. Chest X-ray ordered to verify placement.  CXR: normal.   Procedure was performed using Ultrasound for direct visualization of cannulization of Right IJ.  Line was secured at the 17 cm mark.    Darel Hong, AGACNP-BC [facility] Pager: [phone] Cell: [phone]  [name] 03/20/2019, 1:27 AM

## 2019-03-21 ENCOUNTER — Inpatient Hospital Stay: Payer: BC Managed Care – PPO

## 2019-03-21 DIAGNOSIS — T424X2A Poisoning by benzodiazepines, intentional self-harm, initial encounter: Secondary | ICD-10-CM

## 2019-03-21 LAB — BASIC METABOLIC PANEL
Anion gap: 19 — ABNORMAL HIGH (ref 5–15)
BUN: 113 mg/dL — ABNORMAL HIGH (ref 6–20)
CO2: 21 mmol/L — ABNORMAL LOW (ref 22–32)
Calcium: 7.3 mg/dL — ABNORMAL LOW (ref 8.9–10.3)
Chloride: 100 mmol/L (ref 98–111)
Creatinine, Ser: 8.09 mg/dL — ABNORMAL HIGH (ref 0.61–1.24)
GFR calc Af Amer: 9 mL/min — ABNORMAL LOW (ref >60)
GFR calc non Af Amer: 8 mL/min — ABNORMAL LOW (ref >60)
Glucose, Bld: 187 mg/dL — ABNORMAL HIGH (ref 70–99)
Potassium: 4.8 mmol/L (ref 3.5–5.1)
Sodium: 140 mmol/L (ref 135–145)

## 2019-03-21 LAB — CBC WITH DIFFERENTIAL/PLATELET
Abs Immature Granulocytes: 0.17 10*3/uL — ABNORMAL HIGH (ref 0.00–0.07)
Basophils Absolute: 0 10*3/uL (ref 0.0–0.1)
Basophils Relative: 0 %
Eosinophils Absolute: 0 10*3/uL (ref 0.0–0.5)
Eosinophils Relative: 0 %
HCT: 19 % — ABNORMAL LOW (ref 39.0–52.0)
Hemoglobin: 6.2 g/dL — ABNORMAL LOW (ref 13.0–17.0)
Immature Granulocytes: 1 %
Lymphocytes Relative: 3 %
Lymphs Abs: 0.4 10*3/uL — ABNORMAL LOW (ref 0.7–4.0)
MCH: 27.4 pg (ref 26.0–34.0)
MCHC: 32.6 g/dL (ref 30.0–36.0)
MCV: 84.1 fL (ref 80.0–100.0)
Monocytes Absolute: 0.6 10*3/uL (ref 0.1–1.0)
Monocytes Relative: 5 %
Neutro Abs: 11.4 10*3/uL — ABNORMAL HIGH (ref 1.7–7.7)
Neutrophils Relative %: 91 %
Platelets: 373 10*3/uL (ref 150–400)
RBC: 2.26 MIL/uL — ABNORMAL LOW (ref 4.22–5.81)
RDW: 13.7 % (ref 11.5–15.5)
WBC: 12.6 10*3/uL — ABNORMAL HIGH (ref 4.0–10.5)
nRBC: 0.2 % (ref 0.0–0.2)

## 2019-03-21 LAB — MAGNESIUM: Magnesium: 3.1 mg/dL — ABNORMAL HIGH (ref 1.7–2.4)

## 2019-03-21 LAB — GLUCOSE, CAPILLARY
Glucose-Capillary: 146 mg/dL — ABNORMAL HIGH (ref 70–99)
Glucose-Capillary: 167 mg/dL — ABNORMAL HIGH (ref 70–99)
Glucose-Capillary: 168 mg/dL — ABNORMAL HIGH (ref 70–99)
Glucose-Capillary: 168 mg/dL — ABNORMAL HIGH (ref 70–99)
Glucose-Capillary: 184 mg/dL — ABNORMAL HIGH (ref 70–99)

## 2019-03-21 LAB — PREPARE RBC (CROSSMATCH)

## 2019-03-21 LAB — TRIGLYCERIDES: Triglycerides: 177 mg/dL — ABNORMAL HIGH (ref <150)

## 2019-03-21 LAB — PHOSPHORUS: Phosphorus: 11.8 mg/dL — ABNORMAL HIGH (ref 2.5–4.6)

## 2019-03-21 MED ORDER — LORAZEPAM 2 MG/ML IJ SOLN
2.0000 mg | INTRAMUSCULAR | Status: DC | PRN
  Administered 2019-03-21 (×3): 4 mg via INTRAVENOUS
  Administered 2019-03-22: 2 mg via INTRAVENOUS
  Administered 2019-03-23: 4 mg via INTRAVENOUS
  Administered 2019-03-23: 04:00:00 2 mg via INTRAVENOUS
  Administered 2019-03-24: 4 mg via INTRAVENOUS
  Administered 2019-03-24: 07:00:00 2 mg via INTRAVENOUS
  Administered 2019-03-24: 04:00:00 4 mg via INTRAVENOUS
  Administered 2019-03-24 – 2019-03-25 (×2): 2 mg via INTRAVENOUS
  Administered 2019-03-25: 03:00:00 4 mg via INTRAVENOUS
  Administered 2019-03-25 – 2019-03-28 (×6): 2 mg via INTRAVENOUS
  Filled 2019-03-21 (×5): qty 1
  Filled 2019-03-21: qty 2
  Filled 2019-03-21 (×2): qty 1
  Filled 2019-03-21: qty 2
  Filled 2019-03-21 (×2): qty 1
  Filled 2019-03-21 (×2): qty 2
  Filled 2019-03-21: qty 1
  Filled 2019-03-21 (×2): qty 2
  Filled 2019-03-21 (×3): qty 1

## 2019-03-21 MED ORDER — SODIUM CHLORIDE 0.9% IV SOLUTION: Freq: Once | INTRAVENOUS | Status: DC

## 2019-03-21 MED ORDER — NOREPINEPHRINE 16 MG/250ML-% IV SOLN
0.0000 ug/min | INTRAVENOUS | Status: DC
  Filled 2019-03-21: qty 250

## 2019-03-21 MED ORDER — NOREPINEPHRINE BITARTRATE 1 MG/ML IV SOLN
0.0000 ug/min | INTRAVENOUS | Status: DC
  Filled 2019-03-21: qty 16

## 2019-03-21 NOTE — Progress Notes (Signed)
Post HD Assessment  2.3 kg removal during HD    03/21/19 1410  Neurological  Level of Consciousness Responds to Voice  Orientation Level Intubated/Tracheostomy - Unable to assess  Respiratory  Bilateral Breath Sounds Coarse crackles;Diminished  Airway 7.5 mm  Placement Date/Time: 03/19/19 0605   Difficult airway due to:: Difficult airway - due to anterior larynx  Placed By: ICU physician  Airway Device: Endotracheal Tube  ETT Types: Endobronchial  Size (mm): 7.5 mm  Cuffed: Cuffed  Airway Equipment: Lighte...  Measured From Lips  Secured Location Right  Secured By Charity fundraiser  Site Condition Dry  Cardiac  Pulse Regular  Heart Sounds S1, S2  Jugular Venous Distention (JVD) No  Cardiac Rhythm NSR  Antiarrhythmic device No  Vascular  R Radial Pulse +2  L Radial Pulse +2  R Dorsalis Pedis Pulse +2  L Dorsalis Pedis Pulse +2  Edema Generalized  Generalized Edema +1  RUE Edema +1  LUE Edema +1  RLE Edema +2  LLE Edema +2  Integumentary  Integumentary (WDL) X  Skin Color Pale  Skin Condition Dry  Skin Integrity Rash  Rash Location Back;Abdomen  Rash Location Orientation Other (Comment) (SCATTERED)  Musculoskeletal  Musculoskeletal (WDL) X  Generalized Weakness Yes  Gastrointestinal  Bowel Sounds Assessment Hypoactive;Faint  NG/OG Tube Nasogastric Left nare Documented cm marking at nare/ corner of mouth 75 cm  Placement Date/Time: 03/17/19 0948   Tube Type: Nasogastric  Tube Location: Left nare  Technique Used to Measure Tube Placement: Documented cm marking at nare/ corner of mouth  Initial cm Marking at [employer] of Mouth (if applicable): 75 cm  Site Assessment Clean;Dry;Intact  Ongoing Placement Verification No change in cm markings or external length of tube from initial placement;No change in respiratory status;No acute changes, not attributed to clinical condition;Xray  Status Infusing tube feed  GU Assessment  Genitourinary (WDL) X  Genitourinary  Symptoms Urinary Catheter  Urine Characteristics  Urine Color Yellow/straw  Urine Appearance Clear  Urethral Catheter Rebecca RN Non-latex 14 Fr.  Placement Date/Time: 03/09/19 0203   Perineal care performed prior to insertion?: Yes  Person Inserting Catheter: Wells Guiles RN  Person Assisting with Catheter Insertion: Zach NT2  Patient Location at Time of Insertion: Rm 9  Catheter Type: Non-latex  Tu...  Indication for Insertion or Continuance of Catheter Unstable critically ill patients first 24-48 hours (See Criteria)  Site Assessment Clean;Intact;Dry  Catheter Maintenance Bag below level of bladder;Catheter secured;Drainage bag/tubing not touching floor;Insertion date on drainage bag;No dependent loops;Seal intact  Collection Container Standard drainage bag  Securement Method Securing device (Describe) (CENTURION )  Urinary Catheter Interventions Unclamped  Psychosocial  Psychosocial (WDL) X  Patient Behaviors  (SEDATED/INTUBATED)  Emotional support given Given to patient;Given to patient's family

## 2019-03-21 NOTE — Progress Notes (Signed)
Pre HD Assessment    03/21/19 1020  Neurological  Level of Consciousness Responds to Pain  Orientation Level Intubated/Tracheostomy - Unable to assess  Respiratory  Chest Assessment Chest expansion symmetrical  Bilateral Breath Sounds Diminished;Fine crackles  Airway 7.5 mm  Placement Date/Time: 03/19/19 0605   Difficult airway due to:: Difficult airway - due to anterior larynx  Placed By: ICU physician  Airway Device: Endotracheal Tube  ETT Types: Endobronchial  Size (mm): 7.5 mm  Cuffed: Cuffed  Airway Equipment: Lighte...  Secured at (cm) 25 cm  Measured From Lips  Secured Location Right  Secured By Charity fundraiser  Site Condition Dry  Cardiac  Pulse Regular  Heart Sounds S1, S2  Jugular Venous Distention (JVD) No  Cardiac Rhythm NSR  Antiarrhythmic device No  Vascular  R Radial Pulse +2  L Radial Pulse +2  R Dorsalis Pedis Pulse +2  L Dorsalis Pedis Pulse +2  Edema Generalized  Generalized Edema +1  RUE Edema +1  LUE Edema +1  RLE Edema +2  LLE Edema +2  Integumentary  Integumentary (WDL) X  Skin Color Pale  Skin Condition Dry  Skin Integrity Rash  Rash Location Back;Abdomen  Rash Location Orientation Other (Comment) (SCATTERED)  Musculoskeletal  Musculoskeletal (WDL) X  Generalized Weakness Yes  Gastrointestinal  Bowel Sounds Assessment Hypoactive;Faint  NG/OG Tube Nasogastric Left nare Documented cm marking at nare/ corner of mouth 75 cm  Placement Date/Time: 03/17/19 0948   Tube Type: Nasogastric  Tube Location: Left nare  Technique Used to Measure Tube Placement: Documented cm marking at nare/ corner of mouth  Initial cm Marking at [employer] of Mouth (if applicable): 75 cm  Site Assessment Clean;Dry;Intact  Ongoing Placement Verification No change in cm markings or external length of tube from initial placement;No change in respiratory status;No acute changes, not attributed to clinical condition;Xray  Status Infusing tube feed  GU Assessment   Genitourinary (WDL) X  Genitourinary Symptoms Urinary Catheter  Urine Characteristics  Urine Color Yellow/straw  Urine Appearance Clear  Urethral Catheter Rebecca RN Non-latex 14 Fr.  Placement Date/Time: 03/09/19 0203   Perineal care performed prior to insertion?: Yes  Person Inserting Catheter: Wells Guiles RN  Person Assisting with Catheter Insertion: Zach NT2  Patient Location at Time of Insertion: Rm 9  Catheter Type: Non-latex  Tu...  Indication for Insertion or Continuance of Catheter Unstable critically ill patients first 24-48 hours (See Criteria)  Site Assessment Clean;Intact;Dry  Catheter Maintenance Bag below level of bladder;Catheter secured;Drainage bag/tubing not touching floor;Insertion date on drainage bag;No dependent loops;Seal intact  Collection Container Standard drainage bag  Securement Method Securing device (Describe) (CENTURION )  Urinary Catheter Interventions Unclamped  Psychosocial  Psychosocial (WDL) X  Patient Behaviors  (SEDATED/INTUBATED)  Emotional support given Given to patient;Given to patient's family

## 2019-03-21 NOTE — Progress Notes (Signed)
Patient remains on the vent at this time, critical condition.  Agreeable to inpatient psychiatry after medical clearance.  Reconsider involuntary commitment when patient medically stabilized if a sitter is required while he I son the medical floor awaiting inpatient psychiatry, per Dr Leverne Humbles.  Presently, psychiatry is on stand-by until he is more medically stable and can converse.  Waylan Boga, PMHNP

## 2019-03-21 NOTE — Progress Notes (Signed)
CRITICAL CARE NOTE  CC  follow up respiratory failure  SUBJECTIVE Patient remains critically ill Prognosis is guarded Severe hypoxia On vent Needs transfusion    Vent Mode: PRVC FiO2 (%):  [40 %-65 %] 40 % Set Rate:  [28 bmp] 28 bmp Vt Set:  [530 mL] 530 mL PEEP:  [8 cmH20-10 cmH20] 8 cmH20    BP (!) 107/55   Pulse 85   Temp 99.4 F (37.4 C) (Axillary)   Resp (!) 26   Ht 5' 7.01" (1.702 m)   Wt 95.6 kg   SpO2 94%   BMI 33.00 kg/m    I/O last 3 completed shifts: In: 1610 [I.V.:1092.7; IV Piggyback:704.3] Out: 2800 [Urine:250; Emesis/NG output:50; Other:2500] No intake/output data recorded.  SpO2: 94 % O2 Flow Rate (L/min): 60 L/min FiO2 (%): 40 %   SIGNIFICANT EVENTS 5/31 admitted for severe shock and resp failure DRUG OD 6/1 started CRRT multiorgan failure, resp failure, vasopressors 6/2 severe hypoxia UF rates increased to 500 6/3 DNR, multiorgan failure 6/4 CODE STATUS changed to full code,high risk for reintubation severe hypoxia on CRRT 6/5 high flow [place], on CRRT, high risk for intubation 6/7 resp failure resolved, off CRRT, transfer to gen med floor 6/10 transferred back to ICU for severe resp failure +aspiration of gastric contents 6/11 intubated for severe ARDS, severe hypoxia, NG and flexi seal placed 6/12 severe hypoxia on vent, NEW RT IJ PLACED, RT FEM CVL REMOVED  REVIEW OF SYSTEMS  PATIENT IS UNABLE TO PROVIDE COMPLETE REVIEW OF SYSTEMS DUE TO SEVERE CRITICAL ILLNESS   PHYSICAL EXAMINATION:  GENERAL:critically ill appearing, +resp distress HEAD: Normocephalic, atraumatic.  EYES: Pupils equal, round, reactive to light.  No scleral icterus.  MOUTH: Moist mucosal membrane. NECK: Supple. No thyromegaly. No nodules. No JVD.  PULMONARY: +rhonchi, +wheezing CARDIOVASCULAR: S1 and S2. Regular rate and rhythm. No murmurs, rubs, or gallops.  GASTROINTESTINAL: Soft, nontender, -distended. No masses. Positive bowel sounds. No hepatosplenomegaly.   MUSCULOSKELETAL: No swelling, clubbing, or edema.  NEUROLOGIC: obtunded, GCS<8 SKIN:intact,warm,dry  MEDICATIONS: I have reviewed all medications and confirmed regimen as documented       Indwelling Urinary Catheter continued, requirement due to   Reason to continue Indwelling Urinary Catheter strict Intake/Output monitoring for hemodynamic instability   Central Line/ continued, requirement due to  Reason to continue [place] of central venous pressure or other hemodynamic parameters and poor IV access   Ventilator continued, requirement due to severe respiratory failure   Ventilator Sedation RASS 0 to -2      ASSESSMENT AND PLAN SYNOPSIS  28 year old white male admitted to the ICU for acute drug overdose and toxicity from calcium channel blocker in the setting of multiorgan failure with severehypoxicrespiratory failure,distributive shock and renal failure with metabolic encephalopathy extubated and re-admitted to ICU for severe aspiration pneumonia and ARDS   Severe ACUTE Hypoxic and Hypercapnic Respiratory Failure -continue Full MV support -continue Bronchodilator Therapy -Wean Fio2 and PEEP as tolerated -will perform SAT/SBT when respiratory parameters are met  ACUTE KIDNEY INJURY/Renal Failure -follow chem 7 -follow UO -continue Foley Catheter-assess need -Avoid nephrotoxic agents -Recheck creatinine  HD as needed   NEUROLOGY - intubated and sedated - minimal sedation to achieve a RASS goal: -1 Wake up assessment pending   CARDIAC ICU monitoring  ID -continue IV abx as prescibed -follow up cultures  GI +ileus GI PROPHYLAXIS as indicated  NUTRITIONAL STATUS DIET-->NPO Constipation protocol as indicated  ENDO - will use ICU hypoglycemic\Hyperglycemia protocol if indicated  ELECTROLYTES -follow labs as needed -replace as needed -pharmacy consultation and following   DVT/GI PRX ordered TRANSFUSIONS AS NEEDED MONITOR  FSBS ASSESS the need for LABS as needed   Critical Care Time devoted to patient care services described in this note is 32 minutes.   Overall, patient is critically ill, prognosis is guarded.    Corrin Parker, M.D.  [facility]  Medical Director [place] Director [facility] Cardio-Pulmonary Department

## 2019-03-21 NOTE — Progress Notes (Signed)
[facility], [place] 03/21/19  Subjective:  Patient remains oliguric at this time. Urine output was only 235 cc over the preceding 24 hours. Hemoglobin down to 6.2. Patient underwent dialysis treatment today.   Objective:  Vital signs in last 24 hours:  Temp:  [98.4 F (36.9 C)-99.4 F (37.4 C)] 98.5 F (36.9 C) (06/13 1114) Pulse Rate:  [52-94] 72 (06/13 1300) Resp:  [11-34] 28 (06/13 1300) BP: (92-112)/(42-70) 105/59 (06/13 1300) SpO2:  [84 %-100 %] 100 % (06/13 1300) FiO2 (%):  [40 %-50 %] 40 % (06/13 1154) Weight:  [95.6 kg] 95.6 kg (06/13 1030)  Weight change: -1.7 kg Filed Weights   03/20/19 0251 03/21/19 0428 03/21/19 1030  Weight: 95.9 kg 95.6 kg 95.6 kg    Intake/Output:    Intake/Output Summary (Last 24 hours) at 03/21/2019 1449 Last data filed at 03/21/2019 1230 Gross per 24 hour  Intake 2412.59 ml  Output 235 ml  Net 2177.59 ml   General :        Critically ill-appearing Head:              Endotracheal tube in place Eyes/ENT:      Anicteric Neck:              Supple Lungs:            Scattered rhonchi, vent assisted Heart:             S1S2 no rubs Abdomen:      Soft, non-tender Extremities:   no cyanosis, + dependent edema Skin:               Skin color, texture, turgor normal, no rashes or lesions Neurologic:    intubated, sedated IJ vascath  Basic Metabolic Panel:  Recent Labs  Lab 03/17/19 0536 03/18/19 0350 03/18/19 1209 03/19/19 0541 03/19/19 1930 03/20/19 0343 03/21/19 0349 03/21/19 1100  NA 137 140  --  141  --  140 140  --   K 4.2 4.0  --  4.5  --  4.5 4.8  --   CL 99 102  --  99  --  101 100  --   CO2 19* 21*  --  21*  --  25 21*  --   GLUCOSE 132* 122*  --  124*  --  167* 187*  --   BUN 76* 99*  --  91*  --  78* 113*  --   CREATININE 5.96* 7.62*  --  6.82*  --  6.20* 8.09*  --   CALCIUM 8.5* 8.0*  --  8.1*  --  7.3* 7.3*  --   MG 2.6* 2.8*  --  2.8*  --  2.7* 3.1*  --   PHOS 5.3* 7.9* 5.5*  --   9.0*  --   --  11.8*     CBC: Recent Labs  Lab 03/16/19 0341 03/17/19 0536 03/18/19 0350 03/19/19 0541 03/20/19 0343 03/21/19 0349  WBC 21.8* 18.1* 14.2* 21.6* 14.5* 12.6*  NEUTROABS 16.2*  --   --  17.6* 13.4* 11.4*  HGB 7.9* 7.9* 7.2* 7.9* 6.0* 6.2*  HCT 23.3* 23.7* 21.7* 23.9* 18.9* 19.0*  MCV 80.6 80.6 82.2 82.4 85.5 84.1  PLT 327 364 323 477* 333 373      Lab Results  Component Value Date   HEPBSAG Negative 03/17/2019   HEPBIGM Negative 03/17/2019      Microbiology:  No results found for this or any previous visit (from the past 240  hour(s)).  Coagulation Studies: No results for input(s): LABPROT, INR in the last 72 hours.  Urinalysis: No results for input(s): COLORURINE, LABSPEC, PHURINE, GLUCOSEU, HGBUR, BILIRUBINUR, KETONESUR, PROTEINUR, UROBILINOGEN, NITRITE, LEUKOCYTESUR in the last 72 hours.  Invalid input(s): APPERANCEUR    Imaging: Dg Abd 1 View  Result Date: 03/21/2019 CLINICAL DATA:  Ileus follow-up. EXAM: ABDOMEN - 1 VIEW COMPARISON:  March 19, 2019 FINDINGS: Air-filled dilated loops of primarily: Remain, similar to mildly improved in the interval. No other interval changes. An NG tube terminates in the left upper quadrant. IMPRESSION: 1. Mild improvement in ileus. 2. The NG tube terminates in the left upper quadrant. Electronically Signed   By: Dorise Bullion III M.D   On: 03/21/2019 08:14   Dg Chest Port 1 View  Result Date: 03/20/2019 CLINICAL DATA:  Central line placement EXAM: PORTABLE CHEST 1 VIEW COMPARISON:  03/19/2019 FINDINGS: There is a newly placed right IJ central venous catheter with tip projecting over the proximal SVC. The left-sided central venous catheter is stable in positioning. The endotracheal tube terminates above the carina. The enteric tube extends below the left hemidiaphragm. There are diffuse bilateral hazy airspace opacities with no significant interval improvement. No pneumothorax. The heart size is stable. IMPRESSION: 1.  Newly placed right-sided central venous catheter tip terminates over the proximal SVC. There is no pneumothorax. The remaining lines and tubes are as above. 2. Otherwise, no significant interval change in appearance of the lungs. Electronically Signed   By: Constance Holster M.D.   On: 03/20/2019 01:19   [place] Ekg Site Rite  Result Date: 03/19/2019 If Site Rite image not attached, placement could not be confirmed due to current cardiac rhythm.    Medications:   . sodium chloride Stopped (03/18/19 1430)  . albumin human 12.5 g (03/21/19 1242)  . erythromycin 100 mL/hr at 03/21/19 0300  . feeding supplement (VITAL HIGH PROTEIN) 1,000 mL (03/20/19 2000)  . fentaNYL infusion INTRAVENOUS 200 mcg/hr (03/21/19 0750)  . midazolam 5 mg/hr (03/21/19 0800)  . norepinephrine (LEVOPHED) Adult infusion    . piperacillin-tazobactam (ZOSYN)  IV Stopped (03/21/19 0144)  . propofol (DIPRIVAN) infusion 30 mcg/kg/min (03/21/19 0800)   . sodium chloride   Intravenous Once  . sodium chloride   Intravenous Once  . alteplase  2 mg Intracatheter Once  . alteplase  2 mg Intracatheter Once  . aspirin  325 mg Per Tube Daily  . B-complex with vitamin C  1 tablet Per Tube Daily  . budesonide (PULMICORT) nebulizer solution  0.5 mg Nebulization BID  . chlorhexidine gluconate (MEDLINE KIT)  15 mL Mouth Rinse BID  . Chlorhexidine Gluconate Cloth  6 each Topical Q0600  . feeding supplement (PRO-STAT SUGAR FREE 64)  60 mL Per Tube TID  . heparin injection (subcutaneous)  5,000 Units Subcutaneous Q8H  . insulin aspart  0-9 Units Subcutaneous Q6H  . ipratropium-albuterol  3 mL Nebulization Q4H  . mouth rinse  15 mL Mouth Rinse 10 times per day  . methylPREDNISolone (SOLU-MEDROL) injection  40 mg Intravenous BID  . mirtazapine  15 mg Oral QHS  . multivitamin  15 mL Per Tube Daily  . pantoprazole (PROTONIX) IV  40 mg Intravenous QHS  . polyethylene glycol  17 g Oral Daily  . rocuronium  50 mg Intravenous Once  .  rocuronium  50 mg Intravenous Once  . sodium chloride flush  10-40 mL Intracatheter Q12H   sodium chloride, bisacodyl, haloperidol lactate, ipratropium-albuterol, LORazepam, LORazepam, nitroGLYCERIN, sodium chloride flush  Assessment/ Plan:  28 y.o.caucasian male with HTN , was admitted on 03/08/2019 with ARF, acute resp failure secondary to drug overdose  1.  Oliguric acute renal failure with hyperkalemia (resolved) and volume overload  likely severe ATN CRRT started 6/1-> 6/6.  HD 6/8.  -Patient underwent dialysis treatment again today.  Continue to monitor renal parameters daily.  Patient has been oliguric over the past 24 hours.  2.  Acute respiratory failure Extubated 6/4 am, reintubated 6/10 Patient remains on the ventilator currently.  Weaning protocol as per pulmonary/critical care.  3. Metabolic acidosis -Serum bicarbonate was 21 today.  Continue to monitor.  4. Drug overdose multiple tablets of Phenergan, losartan, amlodipine, clonidine IVC   5.  Hypokalemia Potassium currently 4.8 and acceptable.     LOS: 12 Glenn Rasmussen 6/13/20202:49 PM  [place], [place]  Note: This note was prepared with Dragon dictation. Any transcription errors are unintentional

## 2019-03-21 NOTE — Progress Notes (Signed)
[place] [place] NAME: Glenn Rasmussen    MR#:  [id number]  DATE OF BIRTH:  [birth year]  SUBJECTIVE:   Remains intubated    REVIEW OF SYSTEMS:  Unable to provide due to patient being intubated  DRUG ALLERGIES:  No Known Allergies  VITALS:  Blood pressure (!) 105/59, pulse 72, temperature 98.5 F (36.9 C), temperature source Tympanic, resp. rate (!) 28, height 5' 7.01" (1.702 m), weight 95.6 kg, SpO2 100 %.  PHYSICAL EXAMINATION:  Physical Exam   GENERAL:  28 y.o.-year-old critically ill-appearing patient lying in the bed  EYES: Pupils equal, round, reactive to light and accommodation. No scleral icterus. Extraocular muscles intact. On the ventilator HEENT: Head atraumatic, normocephalic. Oropharynx and nasopharynx clear. NG+ NECK:  Supple, no jugular venous distention. No thyroid enlargement, no tenderness.   LUNGS: distant breath sounds bilaterally, no wheezing Mild use of accessory muscles of respiration.  Decreased bibasilar breath sounds CARDIOVASCULAR: S1, S2 normal. No murmurs, rubs, or gallops.  ABDOMEN: Soft, nontender, nondistended. Bowel sounds present. No organomegaly or mass.  EXTREMITIES: No  cyanosis, or clubbing.  1+ pedal edema noted NEUROLOGIC: Patient sedated    PSYCHIATRIC: on the vent SKIN: No obvious rash, lesion, or ulcer.    LABORATORY PANEL:   CBC Recent Labs  Lab 03/21/19 0349  WBC 12.6*  HGB 6.2*  HCT 19.0*  PLT 373   ------------------------------------------------------------------------------------------------------------------  Chemistries  Recent Labs  Lab 03/17/19 0536  03/21/19 0349  NA 137   < > 140  K 4.2   < > 4.8  CL 99   < > 100  CO2 19*   < > 21*  GLUCOSE 132*   < > 187*  BUN 76*   < > 113*  CREATININE 5.96*   < > 8.09*  CALCIUM 8.5*   < > 7.3*  MG 2.6*   < > 3.1*  AST 58*  --   --   ALT 50*  --   --   ALKPHOS 53  --   --   BILITOT 0.8  --   --    < > = values  in this interval not displayed.   ------------------------------------------------------------------------------------------------------------------  Cardiac Enzymes Recent Labs  Lab 03/16/19 1634  TROPONINI 0.26*   ------------------------------------------------------------------------------------------------------------------  RADIOLOGY:  Dg Abd 1 View  Result Date: 03/21/2019 CLINICAL DATA:  Ileus follow-up. EXAM: ABDOMEN - 1 VIEW COMPARISON:  March 19, 2019 FINDINGS: Air-filled dilated loops of primarily: Remain, similar to mildly improved in the interval. No other interval changes. An NG tube terminates in the left upper quadrant. IMPRESSION: 1. Mild improvement in ileus. 2. The NG tube terminates in the left upper quadrant. Electronically Signed   By: Dorise Bullion III M.D   On: 03/21/2019 08:14   Dg Chest Port 1 View  Result Date: 03/20/2019 CLINICAL DATA:  Central line placement EXAM: PORTABLE CHEST 1 VIEW COMPARISON:  03/19/2019 FINDINGS: There is a newly placed right IJ central venous catheter with tip projecting over the proximal SVC. The left-sided central venous catheter is stable in positioning. The endotracheal tube terminates above the carina. The enteric tube extends below the left hemidiaphragm. There are diffuse bilateral hazy airspace opacities with no significant interval improvement. No pneumothorax. The heart size is stable. IMPRESSION: 1. Newly placed right-sided central venous catheter tip terminates over the proximal SVC. There is no pneumothorax. The remaining lines and tubes are as above. 2. Otherwise, no significant interval change in appearance  of the lungs. Electronically Signed   By: Constance Holster M.D.   On: 03/20/2019 01:19   [place] Ekg Site Rite  Result Date: 03/19/2019 If Site Rite image not attached, placement could not be confirmed due to current cardiac rhythm.   EKG:   Orders placed or performed during the hospital encounter of 03/08/19  . ED EKG   . ED EKG  . ED EKG  . ED EKG  . EKG 12-Lead  . EKG 12-Lead  . EKG 12-Lead  . EKG 12-Lead  . EKG 12-Lead  . EKG 12-Lead  . EKG 12-Lead  . EKG 12-Lead  . EKG 12-Lead  . EKG 12-Lead  . EKG 12-Lead  . EKG 12-Lead  . EKG 12-Lead  . EKG 12-Lead  . EKG - 12 lead  . EKG - 12 lead  . EKG 12-Lead  . EKG 12-Lead    ASSESSMENT AND PLAN:   28 year old male with past medical history significant for hypertension, IBS admitted secondary to overdose on Phenergan, losartan, Norvasc and Klonopin  1.  Acute Hypoxic respiratory failure due to volume overload/?ARDS -Patient is extubated on 03/12/2019--now re-intubated on 03/19/2019 - Patient went into respiratory distress now re-intubated. -EKG--sinus tachycardia -VQ scan low probability for PE. D/ced heparin drip. -Continue hemodialysis with ultrafiltration, IV Solu-Medrol, empiric IV Unasyn  2.Intentional drug overdose -patient overdosed on 38 tablets of Phenergan, 19 tablets of losartan, 42 tablets of Norvasc and 78 tablets of Klonopin -Urine tox positive for benzos and tricyclics -Appreciate psych consult.  Once patient is medically stable, will need inpatient psychiatry admission.  3.  Acute renal failure-likely ATN from hypotension.  -received on CRRT, metabolic acidosis.  Appreciate nephrology consult - CRRTon hold for now-- now on hemodialysis with UF  4. Pneumonia-follow-up procalcitonin and WBC. - Currently on Iv unasyn   5.  DVT prophylaxis- on heparin SQ  6.  GERD-Protonix  7.  Elevated troponin-likely demand ischemia, troponin plateaued vs due to renal failure -  Most recent echo this admission showing normal LV function and no wall motion abnormalities.  8.Anemia appears due to current illness with renal failure No active bleed   D/w dr Mortimer Fries  CODE STATUS: Full code  TOTAL TIME TAKING CARE OF THIS PATIENT: 35 minutes.   POSSIBLE D/C IN ? DAYS, DEPENDING ON CLINICAL CONDITION.  Overall long-term prognosis poor   Dustin Flock M.D on 03/21/2019 at 2:26 PM  Between 7am to 6pm - Pager - [phone]  After 6pm go to [email or web address] - password [facility]  Office  [phone]  CC: Primary care physician; Maeola Sarah, MD

## 2019-03-21 NOTE — Progress Notes (Signed)
HD Tx End  2300 mL fluid removal.     03/21/19 1400  Hand-Off documentation  Report given to (Full Name) Daphine Deutscher RN  Report received from (Full Name) Trellis Paganini RN  Vital Signs  Temp 98 F (36.7 C)  Temp Source Axillary  Pulse Rate 71  Pulse Rate Source Monitor  Resp 20  BP 102/62  BP Location Left Arm  BP Method Automatic  Patient Position (if appropriate) Lying  Oxygen Therapy  SpO2 98 %  O2 Device Ventilator  End Tidal CO2 (EtCO2) 32  Pain Assessment  Pain Scale CPOT  Critical Care Pain Observation Tool (CPOT)  Facial Expression 0  Body Movements 0  Muscle Tension 0  Compliance with ventilator (intubated pts.) 0  Vocalization (extubated pts.) N/A  CPOT Total 0  During Hemodialysis Assessment  Blood Flow Rate (mL/min) 200 mL/min  Arterial Pressure (mmHg) -100 mmHg  Venous Pressure (mmHg) 120 mmHg  Transmembrane Pressure (mmHg) 50 mmHg  Ultrafiltration Rate (mL/min) 870 mL/min  Dialysate Flow Rate (mL/min) 800 ml/min  Conductivity: Machine  14  HD Safety Checks Performed Yes  Dialysis Fluid Bolus Normal Saline  Bolus Amount (mL) 250 mL  Intra-Hemodialysis Comments Tx completed  Post-Hemodialysis Assessment  Rinseback Volume (mL) 250 mL  Dialyzer Clearance Lightly streaked  Duration of HD Treatment -hour(s) 3.5 hour(s)  Hemodialysis Intake (mL) 500 mL  UF Total -Machine (mL) 2800 mL  Net UF (mL) 2300 mL  Tolerated HD Treatment Yes  Hemodialysis Catheter Left Internal jugular Double-lumen  Placement Date/Time: 03/09/19 2037   Placed prior to admission: No  Time Out: Correct patient;Correct site;Correct procedure  Maximum sterile barrier precautions: Sterile gown;Mask;Cap;Large sterile sheet;Sterile gloves;Hand hygiene  Site Prep: Chlorh...  Site Condition No complications  Blue Lumen Status Capped (Central line)  Red Lumen Status Capped (Central line)  Purple Lumen Status N/A  Catheter fill solution Heparin 1000 units/ml  Catheter fill volume  (Arterial) 1.3 cc  Catheter fill volume (Venous) 1.3  Dressing Type Biopatch;Occlusive  Dressing Status Clean;Dry;Intact  Drainage Description None  Post treatment catheter status Capped and Clamped

## 2019-03-21 NOTE — Progress Notes (Signed)
Pre HD Assessment    03/21/19 1030  Hand-Off documentation  Report given to (Full Name) Trellis Paganini RN  Report received from (Full Name) Daphine Deutscher RN  Vital Signs  Temp 98.5 F (36.9 C)  Temp Source Axillary  Pulse Rate 77  Pulse Rate Source Monitor  Resp (!) 25  BP (!) 94/47  BP Location Left Arm  BP Method Automatic  Patient Position (if appropriate) Lying  Oxygen Therapy  SpO2 98 %  O2 Device Ventilator  End Tidal CO2 (EtCO2) 29  Critical Care Pain Observation Tool (CPOT)  Facial Expression 0  Body Movements 0  Muscle Tension 0  Compliance with ventilator (intubated pts.) 0  Vocalization (extubated pts.) N/A  CPOT Total 0  Dialysis Weight  Weight 95.6 kg  Type of Weight Pre-Dialysis  Time-Out for Hemodialysis  What Procedure? Hemodialysis  Pt Identifiers(min of two) First/Last Name;MRN/Account#  Correct Site? No  Correct Side? Yes  Correct Procedure? Yes  Consents Verified? Yes  Rad Studies Available? N/A  Safety Precautions Reviewed? Yes  Engineer, civil (consulting) Number 2  Station Number  [id number])  UF/Alarm Test Passed  Conductivity: Meter 14  Conductivity: Machine  14  pH 7.2  Reverse Osmosis WRO1  Normal Saline Lot Number [id number]  Dialyzer Lot Number [id number]  Disposable Set Lot Number [id number]  Machine Temperature 98.6 F (37 C)  Musician and Audible Yes  Blood Lines Intact and Secured Yes  Pre Treatment Patient Checks  Vascular access used during treatment Catheter  Hepatitis B Surface Antigen Results Negative  Date Hepatitis B Surface Antigen Drawn 03/17/19  Hepatitis B Surface Antibody  (>10)  Date Hepatitis B Surface Antibody Drawn 03/17/19  Hemodialysis Consent Verified Yes  Hemodialysis Standing Orders Initiated Yes  ECG (Telemetry) Monitor On Yes  Prime Ordered Normal Saline  Length of  DialysisTreatment -hour(s) 3.5 Hour(s)  Dialysis Treatment Comments Na 140  Dialyzer Elisio 17H NR  Dialysate 2K, 2.5 Ca  Dialysate Flow  Ordered 3  Blood Flow Rate Ordered 400 mL/min  Ultrafiltration Goal 1.5 Liters  Pre Treatment Labs Phosphorus  Blood Products Ordered Packed Red Blood Cells  Dialysis Blood Pressure Support Ordered Albumin  During Hemodialysis Assessment  Blood Flow Rate (mL/min) 400 mL/min  Arterial Pressure (mmHg) -180 mmHg  Venous Pressure (mmHg) 160 mmHg  Transmembrane Pressure (mmHg) 50 mmHg  Ultrafiltration Rate (mL/min) 870 mL/min  Dialysate Flow Rate (mL/min) 800 ml/min  Conductivity: Machine  14  HD Safety Checks Performed Yes  Dialysis Fluid Bolus Normal Saline  Bolus Amount (mL) 250 mL  Intra-Hemodialysis Comments Tx initiated  Education / Care Plan  Dialysis Education Provided No (Comment) (intubated/sedated)  Hemodialysis Catheter Left Internal jugular Double-lumen  Placement Date/Time: 03/09/19 2037   Placed prior to admission: No  Time Out: Correct patient;Correct site;Correct procedure  Maximum sterile barrier precautions: Sterile gown;Mask;Cap;Large sterile sheet;Sterile gloves;Hand hygiene  Site Prep: Chlorh...  Site Condition No complications  Blue Lumen Status Infusing  Red Lumen Status Infusing  Dressing Type Biopatch;Occlusive  Dressing Status Clean;Dry;Intact  Drainage Description None

## 2019-03-22 ENCOUNTER — Inpatient Hospital Stay: Payer: BC Managed Care – PPO

## 2019-03-22 LAB — BASIC METABOLIC PANEL
Anion gap: 20 — ABNORMAL HIGH (ref 5–15)
BUN: 99 mg/dL — ABNORMAL HIGH (ref 6–20)
CO2: 26 mmol/L (ref 22–32)
Calcium: 8 mg/dL — ABNORMAL LOW (ref 8.9–10.3)
Chloride: 95 mmol/L — ABNORMAL LOW (ref 98–111)
Creatinine, Ser: 5.61 mg/dL — ABNORMAL HIGH (ref 0.61–1.24)
GFR calc Af Amer: 14 mL/min — ABNORMAL LOW (ref >60)
GFR calc non Af Amer: 12 mL/min — ABNORMAL LOW (ref >60)
Glucose, Bld: 172 mg/dL — ABNORMAL HIGH (ref 70–99)
Potassium: 5.1 mmol/L (ref 3.5–5.1)
Sodium: 141 mmol/L (ref 135–145)

## 2019-03-22 LAB — BLOOD GAS, ARTERIAL
Acid-Base Excess: 2.2 mmol/L — ABNORMAL HIGH (ref 0.0–2.0)
Bicarbonate: 25.1 mmol/L (ref 20.0–28.0)
Delivery systems: POSITIVE
Expiratory PAP: 8
FIO2: 0.7
Inspiratory PAP: 16
O2 Saturation: 99.6 %
Patient temperature: 37
pCO2 arterial: 33 mmHg (ref 32.0–48.0)
pH, Arterial: 7.49 — ABNORMAL HIGH (ref 7.350–7.450)
pO2, Arterial: 171 mmHg — ABNORMAL HIGH (ref 83.0–108.0)

## 2019-03-22 LAB — TYPE AND SCREEN
ABO/RH(D): A POS
Antibody Screen: NEGATIVE
Unit division: 0
Unit division: 0

## 2019-03-22 LAB — PHOSPHORUS: Phosphorus: 9.1 mg/dL — ABNORMAL HIGH (ref 2.5–4.6)

## 2019-03-22 LAB — BPAM RBC
Blood Product Expiration Date: 202006262359
Blood Product Expiration Date: 202006272359
ISSUE DATE / TIME: 202006121214
ISSUE DATE / TIME: 202006131058
Unit Type and Rh: 6200
Unit Type and Rh: 6200

## 2019-03-22 LAB — GLUCOSE, CAPILLARY
Glucose-Capillary: 120 mg/dL — ABNORMAL HIGH (ref 70–99)
Glucose-Capillary: 136 mg/dL — ABNORMAL HIGH (ref 70–99)
Glucose-Capillary: 145 mg/dL — ABNORMAL HIGH (ref 70–99)
Glucose-Capillary: 166 mg/dL — ABNORMAL HIGH (ref 70–99)

## 2019-03-22 LAB — CBC WITH DIFFERENTIAL/PLATELET
Abs Immature Granulocytes: 0.21 10*3/uL — ABNORMAL HIGH (ref 0.00–0.07)
Basophils Absolute: 0 10*3/uL (ref 0.0–0.1)
Basophils Relative: 0 %
Eosinophils Absolute: 0 10*3/uL (ref 0.0–0.5)
Eosinophils Relative: 0 %
HCT: 24 % — ABNORMAL LOW (ref 39.0–52.0)
Hemoglobin: 7.8 g/dL — ABNORMAL LOW (ref 13.0–17.0)
Immature Granulocytes: 2 %
Lymphocytes Relative: 4 %
Lymphs Abs: 0.4 10*3/uL — ABNORMAL LOW (ref 0.7–4.0)
MCH: 26.8 pg (ref 26.0–34.0)
MCHC: 32.5 g/dL (ref 30.0–36.0)
MCV: 82.5 fL (ref 80.0–100.0)
Monocytes Absolute: 0.8 10*3/uL (ref 0.1–1.0)
Monocytes Relative: 7 %
Neutro Abs: 9.4 10*3/uL — ABNORMAL HIGH (ref 1.7–7.7)
Neutrophils Relative %: 87 %
Platelets: 453 10*3/uL — ABNORMAL HIGH (ref 150–400)
RBC: 2.91 MIL/uL — ABNORMAL LOW (ref 4.22–5.81)
RDW: 13.5 % (ref 11.5–15.5)
WBC: 10.8 10*3/uL — ABNORMAL HIGH (ref 4.0–10.5)
nRBC: 0.2 % (ref 0.0–0.2)

## 2019-03-22 LAB — MAGNESIUM: Magnesium: 2.7 mg/dL — ABNORMAL HIGH (ref 1.7–2.4)

## 2019-03-22 LAB — TRIGLYCERIDES: Triglycerides: 256 mg/dL — ABNORMAL HIGH (ref <150)

## 2019-03-22 MED ORDER — FUROSEMIDE 10 MG/ML IJ SOLN
60.0000 mg | Freq: Once | INTRAMUSCULAR | Status: AC
  Administered 2019-03-22: 60 mg via INTRAVENOUS
  Filled 2019-03-22: qty 6

## 2019-03-22 NOTE — Progress Notes (Signed)
Nurse request for support with suicidal thoughts. Chaplain offered emotional and spiritual care. Josh was emotional and able to name his pain. Chaplain offered listening presence and words of encouragement. Chaplain shared images of his family showing up to the hospital on 6.2 along with reminders of his originial goodness while holding the reality of his darkness.    03/22/19 1700  Clinical Encounter Type  Visited With Patient  Visit Type Follow-up  Spiritual Encounters  Spiritual Needs Emotional  Stress Factors  Patient Stress Factors Exhausted

## 2019-03-22 NOTE — Progress Notes (Signed)
Patients saturations kept dropping on HFNC, changed to heated high flow nasal cannula at 50% and 50L. Satis increased to 91%.

## 2019-03-22 NOTE — Progress Notes (Signed)
[place] at [place] NAME: Glenn Rasmussen    MR#:  [id number]  DATE OF BIRTH:  [birth year]  SUBJECTIVE:   Patient sedation on hold plan for extubation tomorrow  REVIEW OF SYSTEMS:  Unable to provide due to patient being intubated  DRUG ALLERGIES:  No Known Allergies  VITALS:  Blood pressure 125/65, pulse 93, temperature 99.2 F (37.3 C), temperature source Oral, resp. rate 20, height 5' 7.01" (1.702 m), weight 98.7 kg, SpO2 93 %.  PHYSICAL EXAMINATION:  Physical Exam   GENERAL:  28 y.o.-year-old critically ill-appearing patient lying in the bed  EYES: Pupils equal, round, reactive to light and accommodation. No scleral icterus. Extraocular muscles intact. On the ventilator HEENT: Head atraumatic, normocephalic. Oropharynx and nasopharynx clear.  NECK:  Supple, no jugular venous distention. No thyroid enlargement, no tenderness.   LUNGS: distant breath sounds bilaterally, no wheezing Mild use of accessory muscles of respiration.  Decreased bibasilar breath sounds CARDIOVASCULAR: S1, S2 normal. No murmurs, rubs, or gallops.  ABDOMEN: Soft, nontender, nondistended. Bowel sounds present. No organomegaly or mass.  EXTREMITIES: No  cyanosis, or clubbing.  1+ pedal edema noted NEUROLOGIC: Patient sedated    PSYCHIATRIC: on the vent SKIN: No obvious rash, lesion, or ulcer.    LABORATORY PANEL:   CBC Recent Labs  Lab 03/22/19 0452  WBC 10.8*  HGB 7.8*  HCT 24.0*  PLT 453*   ------------------------------------------------------------------------------------------------------------------  Chemistries  Recent Labs  Lab 03/17/19 0536  03/22/19 0452  NA 137   < > 141  K 4.2   < > 5.1  CL 99   < > 95*  CO2 19*   < > 26  GLUCOSE 132*   < > 172*  BUN 76*   < > 99*  CREATININE 5.96*   < > 5.61*  CALCIUM 8.5*   < > 8.0*  MG 2.6*   < > 2.7*  AST 58*  --   --   ALT 50*  --   --   ALKPHOS 53  --   --   BILITOT 0.8  --   --    < > = values in this interval not displayed.   ------------------------------------------------------------------------------------------------------------------  Cardiac Enzymes Recent Labs  Lab 03/16/19 1634  TROPONINI 0.26*   ------------------------------------------------------------------------------------------------------------------  RADIOLOGY:  Dg Abd 1 View  Result Date: 03/21/2019 CLINICAL DATA:  Ileus follow-up. EXAM: ABDOMEN - 1 VIEW COMPARISON:  March 19, 2019 FINDINGS: Air-filled dilated loops of primarily: Remain, similar to mildly improved in the interval. No other interval changes. An NG tube terminates in the left upper quadrant. IMPRESSION: 1. Mild improvement in ileus. 2. The NG tube terminates in the left upper quadrant. Electronically Signed   By: Dorise Bullion III M.D   On: 03/21/2019 08:14   Dg Chest Port 1 View  Result Date: 03/22/2019 CLINICAL DATA:  Acute respiratory failure. Endotracheally intubated. EXAM: PORTABLE CHEST 1 VIEW COMPARISON:  03/20/2019 FINDINGS: Support lines and tubes in appropriate position. Low lung volumes are again seen. Moderate diffuse pulmonary airspace disease shows no significant change. Heart size is stable. IMPRESSION: Moderate diffuse pulmonary airspace disease, without significant change. Electronically Signed   By: Earle Gell M.D.   On: 03/22/2019 07:34    EKG:   Orders placed or performed during the hospital encounter of 03/08/19  . ED EKG  . ED EKG  . ED EKG  . ED EKG  . EKG 12-Lead  . EKG 12-Lead  .  EKG 12-Lead  . EKG 12-Lead  . EKG 12-Lead  . EKG 12-Lead  . EKG 12-Lead  . EKG 12-Lead  . EKG 12-Lead  . EKG 12-Lead  . EKG 12-Lead  . EKG 12-Lead  . EKG 12-Lead  . EKG 12-Lead  . EKG - 12 lead  . EKG - 12 lead  . EKG 12-Lead  . EKG 12-Lead    ASSESSMENT AND PLAN:   28 year old male with past medical history significant for hypertension, IBS admitted secondary to overdose on Phenergan, losartan, Norvasc  and Klonopin  1.  Acute Hypoxic respiratory failure due to volume overload/?ARDS -Patient is extubated on 03/12/2019--now re-intubated on 03/19/2019, plan for extubation later today - Patient went into respiratory distress now re-intubated. -EKG--sinus tachycardia -VQ scan low probability for PE. D/ced heparin drip. -Continue hemodialysis with ultrafiltration, IV Solu-Medrol, empiric IV Unasyn  2.Intentional drug overdose -patient overdosed on 38 tablets of Phenergan, 19 tablets of losartan, 42 tablets of Norvasc and 78 tablets of Klonopin -Urine tox positive for benzos and tricyclics -Appreciate psych consult.  Once patient is medically stable, will need inpatient psychiatry admission.  3.  Acute renal failure-likely ATN from hypotension.  -received on CRRT, metabolic acidosis.  Appreciate nephrology consult Will have hemodialysis tomorrow  4. Pneumonia-follow-up procalcitonin and WBC. - Currently on Iv unasyn   5.  DVT prophylaxis- on heparin SQ  6.  GERD-Protonix  7.  Elevated troponin-likely demand ischemia, troponin plateaued vs due to renal failure -  Most recent echo this admission showing normal LV function and no wall motion abnormalities.  8.Anemia appears due to current illness with renal failure No active bleed   D/w dr Mortimer Fries  CODE STATUS: Full code  TOTAL TIME TAKING CARE OF THIS PATIENT: 35 minutes.   POSSIBLE D/C IN ? DAYS, DEPENDING ON CLINICAL CONDITION.  Overall long-term prognosis poor  Dustin Flock M.D on 03/22/2019 at 1:15 PM  Between 7am to 6pm - Pager - [phone]  After 6pm go to [email or web address] - password [email or web address]  Office  [phone]  CC: Primary care physician; Maeola Sarah, MD

## 2019-03-22 NOTE — Progress Notes (Signed)
Patient successfully extubated to [facility] with no complications. Oxygen saturations of 92%. Tol well at this time.

## 2019-03-22 NOTE — Progress Notes (Signed)
Patient recently off vent but sitter reports decreases in sats when trying to talk or moving around in bed.  Deferred assessment until he is more medically stable.  Waylan Boga, PMHNP

## 2019-03-22 NOTE — Progress Notes (Signed)
[facility], [place] 03/22/19  Subjective:  Patient successfully extubated this a.m. to nasal cannula. Urine output was only 325 cc over the preceding 24 hours. Considering dialysis again tomorrow.  Objective:  Vital signs in last 24 hours:  Temp:  [97.8 F (36.6 C)-99.2 F (37.3 C)] 99.2 F (37.3 C) (06/14 0750) Pulse Rate:  [67-95] 93 (06/14 1100) Resp:  [14-28] 20 (06/14 1100) BP: (95-125)/(48-75) 125/65 (06/14 1100) SpO2:  [89 %-100 %] 93 % (06/14 1122) FiO2 (%):  [35 %] 35 % (06/14 0849) Weight:  [98.7 kg] 98.7 kg (06/14 0457)  Weight change: 0 kg Filed Weights   03/21/19 0428 03/21/19 1030 03/22/19 0457  Weight: 95.6 kg 95.6 kg 98.7 kg    Intake/Output:    Intake/Output Summary (Last 24 hours) at 03/22/2019 1239 Last data filed at 03/22/2019 0630 Gross per 24 hour  Intake 1860.51 ml  Output 2635 ml  Net -774.49 ml   General :        No acute distress Head:              [place]/AT hearing intact Eyes/ENT:      Anicteric Neck:              Supple Lungs:            Scattered rhonchi, normal effort Heart:             S1S2 no rubs Abdomen:      Soft, non-tender Extremities:   1+ LE edema Skin:               No rash, warm/dry Neurologic:    Awake, alert, restrainted IJ vascath  Basic Metabolic Panel:  Recent Labs  Lab 03/18/19 0350 03/18/19 1209 03/19/19 0541 03/19/19 1930 03/20/19 0343 03/21/19 0349 03/21/19 1100 03/22/19 0452  NA 140  --  141  --  140 140  --  141  K 4.0  --  4.5  --  4.5 4.8  --  5.1  CL 102  --  99  --  101 100  --  95*  CO2 21*  --  21*  --  25 21*  --  26  GLUCOSE 122*  --  124*  --  167* 187*  --  172*  BUN 99*  --  91*  --  78* 113*  --  99*  CREATININE 7.62*  --  6.82*  --  6.20* 8.09*  --  5.61*  CALCIUM 8.0*  --  8.1*  --  7.3* 7.3*  --  8.0*  MG 2.8*  --  2.8*  --  2.7* 3.1*  --  2.7*  PHOS 7.9* 5.5*  --  9.0*  --   --  11.8* 9.1*     CBC: Recent Labs  Lab 03/16/19 0341  03/18/19 0350  03/19/19 0541 03/20/19 0343 03/21/19 0349 03/22/19 0452  WBC 21.8*   < > 14.2* 21.6* 14.5* 12.6* 10.8*  NEUTROABS 16.2*  --   --  17.6* 13.4* 11.4* 9.4*  HGB 7.9*   < > 7.2* 7.9* 6.0* 6.2* 7.8*  HCT 23.3*   < > 21.7* 23.9* 18.9* 19.0* 24.0*  MCV 80.6   < > 82.2 82.4 85.5 84.1 82.5  PLT 327   < > 323 477* 333 373 453*   < > = values in this interval not displayed.      Lab Results  Component Value Date   HEPBSAG Negative 03/17/2019   HEPBIGM Negative  03/17/2019      Microbiology:  No results found for this or any previous visit (from the past 240 hour(s)).  Coagulation Studies: No results for input(s): LABPROT, INR in the last 72 hours.  Urinalysis: No results for input(s): COLORURINE, LABSPEC, PHURINE, GLUCOSEU, HGBUR, BILIRUBINUR, KETONESUR, PROTEINUR, UROBILINOGEN, NITRITE, LEUKOCYTESUR in the last 72 hours.  Invalid input(s): APPERANCEUR    Imaging: Dg Abd 1 View  Result Date: 03/21/2019 CLINICAL DATA:  Ileus follow-up. EXAM: ABDOMEN - 1 VIEW COMPARISON:  March 19, 2019 FINDINGS: Air-filled dilated loops of primarily: Remain, similar to mildly improved in the interval. No other interval changes. An NG tube terminates in the left upper quadrant. IMPRESSION: 1. Mild improvement in ileus. 2. The NG tube terminates in the left upper quadrant. Electronically Signed   By: Dorise Bullion III M.D   On: 03/21/2019 08:14   Dg Chest Port 1 View  Result Date: 03/22/2019 CLINICAL DATA:  Acute respiratory failure. Endotracheally intubated. EXAM: PORTABLE CHEST 1 VIEW COMPARISON:  03/20/2019 FINDINGS: Support lines and tubes in appropriate position. Low lung volumes are again seen. Moderate diffuse pulmonary airspace disease shows no significant change. Heart size is stable. IMPRESSION: Moderate diffuse pulmonary airspace disease, without significant change. Electronically Signed   By: Earle Gell M.D.   On: 03/22/2019 07:34     Medications:   . sodium chloride Stopped (03/18/19  1430)  . albumin human 12.5 g (03/22/19 0954)  . feeding supplement (VITAL HIGH PROTEIN) 1,000 mL (03/21/19 2225)  . fentaNYL infusion INTRAVENOUS 50 mcg/hr (03/22/19 0850)  . midazolam Stopped (03/22/19 0850)  . norepinephrine (LEVOPHED) Adult infusion Stopped (03/21/19 1200)  . piperacillin-tazobactam (ZOSYN)  IV 3.375 g (03/22/19 0936)  . propofol (DIPRIVAN) infusion 20 mcg/kg/min (03/22/19 0851)   . alteplase  2 mg Intracatheter Once  . alteplase  2 mg Intracatheter Once  . aspirin  325 mg Per Tube Daily  . B-complex with vitamin C  1 tablet Per Tube Daily  . budesonide (PULMICORT) nebulizer solution  0.5 mg Nebulization BID  . chlorhexidine gluconate (MEDLINE KIT)  15 mL Mouth Rinse BID  . Chlorhexidine Gluconate Cloth  6 each Topical Q0600  . feeding supplement (PRO-STAT SUGAR FREE 64)  60 mL Per Tube TID  . heparin injection (subcutaneous)  5,000 Units Subcutaneous Q8H  . insulin aspart  0-9 Units Subcutaneous Q6H  . ipratropium-albuterol  3 mL Nebulization Q4H  . mouth rinse  15 mL Mouth Rinse 10 times per day  . methylPREDNISolone (SOLU-MEDROL) injection  40 mg Intravenous BID  . mirtazapine  15 mg Oral QHS  . multivitamin  15 mL Per Tube Daily  . pantoprazole (PROTONIX) IV  40 mg Intravenous QHS  . polyethylene glycol  17 g Oral Daily  . sodium chloride flush  10-40 mL Intracatheter Q12H   sodium chloride, bisacodyl, haloperidol lactate, ipratropium-albuterol, LORazepam, nitroGLYCERIN, sodium chloride flush  Assessment/ Plan:  28 y.o.caucasian male with HTN , was admitted on 03/08/2019 with ARF, acute resp failure secondary to drug overdose  1.  Oliguric acute renal failure with hyperkalemia (resolved) and volume overload  likely severe ATN CRRT started 6/1-> 6/6.  HD 6/8.  -Urine output was only 325 cc over the preceding 24 hours.  We will plan for dialysis session tomorrow.  2.  Acute respiratory failure Extubated 6/4 am, reintubated 6/10, extubated 6/14 Patient  extubated today and currently breathing comfortably.  We will plan for additional dialysis treatment tomorrow.  3. Metabolic acidosis -Resolved at the moment.  Serum bicarbonate 26 at last check.  4. Drug overdose multiple tablets of Phenergan, losartan, amlodipine, clonidine IVC   5.  Hypokalemia Resolved, potassium currently 5.1.     LOS: 13 Phoenix Dresser 6/14/202012:39 PM  [place], [place]  Note: This note was prepared with Dragon dictation. Any transcription errors are unintentional

## 2019-03-22 NOTE — Progress Notes (Signed)
Assisted tele visit to patient with mother.  Mother unable to join at this time due to shopping, will call back when she has returned to the house.  Lenox Ahr, RN

## 2019-03-22 NOTE — Progress Notes (Signed)
CRITICAL CARE NOTE  CC  follow up respiratory failure  SUBJECTIVE Patient remains critically ill Prognosis is guarded On vent  Vent Mode: PRVC FiO2 (%):  [35 %-50 %] 35 % Set Rate:  [28 bmp] 28 bmp Vt Set:  [530 mL] 530 mL PEEP:  [5 cmH20] 5 cmH20 Plateau Pressure:  [12 cmH20-14 cmH20] 14 cmH20    BP (!) 99/55   Pulse 73   Temp 99.2 F (37.3 C) (Oral)   Resp (!) 28   Ht 5' 7.01" (1.702 m)   Wt 98.7 kg   SpO2 96%   BMI 34.07 kg/m    I/O last 3 completed shifts: In: 3588.1 [I.V.:1438.8; Blood:680; Other:100; NG/GT:770; IV Piggyback:599.3] Out: 2795 [Urine:485; Other:2300; Stool:10] No intake/output data recorded.  SpO2: 96 % O2 Flow Rate (L/min): 60 L/min FiO2 (%): 35 %   SIGNIFICANT EVENTS 5/31 admitted for severe shock and resp failure DRUG OD 6/1 started CRRT multiorgan failure, resp failure, vasopressors 6/2 severe hypoxia UF rates increased to 500 6/3 DNR, multiorgan failure 6/4 CODE STATUS changed to full code,high risk for reintubation severe hypoxia on CRRT 6/5 high flow [place], on CRRT, high risk for intubation 6/7 resp failure resolved, off CRRT, transfer to gen med floor 6/10 transferred back to ICU for severe resp failure +aspiration of gastric contents 6/11 intubated for severe ARDS, severe hypoxia, NG and flexi seal placed 6/12 severe hypoxia on vent, NEW RT IJ PLACED, RT FEM CVL REMOVED 6/13 failed weaning trials  REVIEW OF SYSTEMS  PATIENT IS UNABLE TO PROVIDE COMPLETE REVIEW OF SYSTEMS DUE TO SEVERE CRITICAL ILLNESS   PHYSICAL EXAMINATION:  GENERAL:critically ill appearing, +resp distress HEAD: Normocephalic, atraumatic.  EYES: Pupils equal, round, reactive to light.  No scleral icterus.  MOUTH: Moist mucosal membrane. NECK: Supple. No thyromegaly. No nodules. No JVD.  PULMONARY: +rhonchi, +wheezing CARDIOVASCULAR: S1 and S2. Regular rate and rhythm. No murmurs, rubs, or gallops.  GASTROINTESTINAL: Soft, nontender, -distended. No masses.  Positive bowel sounds. No hepatosplenomegaly.  MUSCULOSKELETAL: No swelling, clubbing, or edema.  NEUROLOGIC: obtunded, GCS<8 SKIN:intact,warm,dry  MEDICATIONS: I have reviewed all medications and confirmed regimen as documented       IMAGING    Dg Chest Port 1 View  Result Date: 03/22/2019 CLINICAL DATA:  Acute respiratory failure. Endotracheally intubated. EXAM: PORTABLE CHEST 1 VIEW COMPARISON:  03/20/2019 FINDINGS: Support lines and tubes in appropriate position. Low lung volumes are again seen. Moderate diffuse pulmonary airspace disease shows no significant change. Heart size is stable. IMPRESSION: Moderate diffuse pulmonary airspace disease, without significant change. Electronically Signed   By: Earle Gell M.D.   On: 03/22/2019 07:34       Indwelling Urinary Catheter continued, requirement due to   Reason to continue Indwelling Urinary Catheter strict Intake/Output monitoring for hemodynamic instability   Central Line/ continued, requirement due to  Reason to continue [place] of central venous pressure or other hemodynamic parameters and poor IV access   Ventilator continued, requirement due to severe respiratory failure   Ventilator Sedation RASS 0 to -2      ASSESSMENT AND PLAN SYNOPSIS  28 year old white male admitted to the ICU for acute drug overdose and toxicity from calcium channel blocker in the setting of multiorgan failure with severehypoxicrespiratory failure,distributive shock and renal failure with metabolic encephalopathyextubated and re-admitted to ICU for severe aspiration pneumonia and ARDS  Severe ACUTE Hypoxic and Hypercapnic Respiratory Failure -continue Full MV support -continue Bronchodilator Therapy -Wean Fio2 and PEEP as tolerated -will perform SAT/SBT  when respiratory parameters are met  ACUTE KIDNEY INJURY/Renal Failure -follow chem 7 -follow UO -continue Foley Catheter-assess need -Avoid nephrotoxic  agents -Recheck creatinine  HD as needed   NEUROLOGY - intubated and sedated - minimal sedation to achieve a RASS goal: -1 Wake up assessment pending   CARDIAC ICU monitoring  ID aspiration pneumionia -continue IV abx as prescibed -follow up cultures  GI GI PROPHYLAXIS as indicated  NUTRITIONAL STATUS DIET-->TF's as tolerated Constipation protocol as indicated  ENDO - will use ICU hypoglycemic\Hyperglycemia protocol if indicated   ELECTROLYTES -follow labs as needed -replace as needed -pharmacy consultation and following   DVT/GI PRX ordered TRANSFUSIONS AS NEEDED MONITOR FSBS ASSESS the need for LABS as needed   Critical Care Time devoted to patient care services described in this note is 32 minutes.   Overall, patient is critically ill, prognosis is guarded.  Patient with Multiorgan failure and at high risk for cardiac arrest and death.    Corrin Parker, M.D.  [facility]  Medical Director [place] [facility] Cardio-Pulmonary Department

## 2019-03-22 NOTE — Progress Notes (Signed)
Assisted tele visit to patient with mother. ° °Roschelle Calandra M, RN   °

## 2019-03-23 LAB — CBC
HCT: 22.6 % — ABNORMAL LOW (ref 39.0–52.0)
Hemoglobin: 7.5 g/dL — ABNORMAL LOW (ref 13.0–17.0)
MCH: 27.3 pg (ref 26.0–34.0)
MCHC: 33.2 g/dL (ref 30.0–36.0)
MCV: 82.2 fL (ref 80.0–100.0)
Platelets: 394 10*3/uL (ref 150–400)
RBC: 2.75 MIL/uL — ABNORMAL LOW (ref 4.22–5.81)
RDW: 13.6 % (ref 11.5–15.5)
WBC: 14.6 10*3/uL — ABNORMAL HIGH (ref 4.0–10.5)
nRBC: 0 % (ref 0.0–0.2)

## 2019-03-23 LAB — GLUCOSE, CAPILLARY
Glucose-Capillary: 145 mg/dL — ABNORMAL HIGH (ref 70–99)
Glucose-Capillary: 108 mg/dL — ABNORMAL HIGH (ref 70–99)
Glucose-Capillary: 123 mg/dL — ABNORMAL HIGH (ref 70–99)
Glucose-Capillary: 103 mg/dL — ABNORMAL HIGH (ref 70–99)

## 2019-03-23 LAB — COMPREHENSIVE METABOLIC PANEL
ALT: 60 U/L — ABNORMAL HIGH (ref 0–44)
AST: 40 U/L (ref 15–41)
Albumin: 3.9 g/dL (ref 3.5–5.0)
Alkaline Phosphatase: 97 U/L (ref 38–126)
Anion gap: 20 — ABNORMAL HIGH (ref 5–15)
BUN: 141 mg/dL — ABNORMAL HIGH (ref 6–20)
CO2: 24 mmol/L (ref 22–32)
Calcium: 7.7 mg/dL — ABNORMAL LOW (ref 8.9–10.3)
Chloride: 99 mmol/L (ref 98–111)
Creatinine, Ser: 6.86 mg/dL — ABNORMAL HIGH (ref 0.61–1.24)
GFR calc Af Amer: 11 mL/min — ABNORMAL LOW (ref >60)
GFR calc non Af Amer: 10 mL/min — ABNORMAL LOW (ref >60)
Glucose, Bld: 144 mg/dL — ABNORMAL HIGH (ref 70–99)
Potassium: 4.9 mmol/L (ref 3.5–5.1)
Sodium: 143 mmol/L (ref 135–145)
Total Bilirubin: 0.8 mg/dL (ref 0.3–1.2)
Total Protein: 6.4 g/dL — ABNORMAL LOW (ref 6.5–8.1)

## 2019-03-23 LAB — BLOOD GAS, ARTERIAL
Acid-Base Excess: 2.2 mmol/L — ABNORMAL HIGH (ref 0.0–2.0)
Bicarbonate: 25.3 mmol/L (ref 20.0–28.0)
Delivery systems: POSITIVE
Expiratory PAP: 8
FIO2: 0.45
Inspiratory PAP: 16
O2 Saturation: 95 %
Patient temperature: 37
pCO2 arterial: 34 mmHg (ref 32.0–48.0)
pH, Arterial: 7.48 — ABNORMAL HIGH (ref 7.350–7.450)
pO2, Arterial: 70 mmHg — ABNORMAL LOW (ref 83.0–108.0)

## 2019-03-23 LAB — MAGNESIUM: Magnesium: 2.5 mg/dL — ABNORMAL HIGH (ref 1.7–2.4)

## 2019-03-23 LAB — TRIGLYCERIDES: Triglycerides: 263 mg/dL — ABNORMAL HIGH (ref <150)

## 2019-03-23 LAB — PHOSPHORUS: Phosphorus: 9.2 mg/dL — ABNORMAL HIGH (ref 2.5–4.6)

## 2019-03-23 MED ORDER — POLYETHYLENE GLYCOL 3350 17 G PO PACK: 17.0000 g | PACK | Freq: Every day | ORAL | Status: DC

## 2019-03-23 MED ORDER — POLYETHYLENE GLYCOL 3350 17 G PO PACK: 17.0000 g | PACK | Freq: Every day | ORAL | Status: DC | PRN

## 2019-03-23 MED ORDER — PANTOPRAZOLE SODIUM 40 MG PO PACK: 40.0000 mg | PACK | Freq: Every day | ORAL | Status: DC

## 2019-03-23 NOTE — Progress Notes (Signed)
Post HD Assessment    03/23/19 1340  Neurological  Level of Consciousness Alert  Orientation Level Oriented to person;Oriented to situation  Respiratory  Respiratory Pattern Labored  Chest Assessment Chest expansion symmetrical  Bilateral Breath Sounds Rhonchi  Cough None  Cardiac  Pulse Regular  Heart Sounds S1, S2  Jugular Venous Distention (JVD) No  ECG Monitor Yes  Cardiac Rhythm NSR  Vascular  R Radial Pulse +2  L Radial Pulse +2  Generalized Edema +2  Psychosocial  Psychosocial (WDL) WDL  Patient Behaviors Calm;Appropriate for situation

## 2019-03-23 NOTE — Progress Notes (Signed)
HD Tx completed, tolerated well    03/23/19 1330  Vital Signs  Temp 98.4 F (36.9 C)  Temp Source Oral  Pulse Rate 67  Pulse Rate Source Monitor  Resp (!) 25  BP 131/76  BP Location Right Arm  BP Method Automatic  Patient Position (if appropriate) Lying  Oxygen Therapy  SpO2 99 %  O2 Device Bi-PAP  Pulse Oximetry Type Continuous  During Hemodialysis Assessment  HD Safety Checks Performed Yes  KECN 60.9 KECN  Dialysis Fluid Bolus Normal Saline  Bolus Amount (mL) 250 mL  Intra-Hemodialysis Comments Tx completed;Tolerated well

## 2019-03-23 NOTE — Progress Notes (Signed)
Patient seen today with anxious affect.  He was currently receiving dialysis and with [facility] for acute respiratory failure.  No acute psychiatric needs today.  Psychiatry will continue to follow.  Lavella Hammock, MD

## 2019-03-23 NOTE — Progress Notes (Signed)
Follow up - Critical Care Medicine Note  Patient Details:    Glenn Rasmussen is an 28 y.o. male.admitted with overdose on benzodiazepine, ARB, calcium channel blocker, and Phenergan.  Required intubation and hemodialysis for respiratory failure and renal failure.  Subsequently transferred out of the ICU.    Readmitted to ICU  due to respiratory distress.  Lines, Airways, Drains: CVC Triple Lumen 03/09/19 Right Femoral (Active)  Indication for Insertion or Continuance of Line Prolonged intravenous therapies 03/17/2019 12:00 PM  Site Assessment Clean;Dry;Intact 03/17/2019 12:00 PM  Proximal Lumen Status Flushed;Saline locked;Blood return noted 03/17/2019 12:00 PM  Medial Lumen Status Flushed;Saline locked;Blood return noted 03/17/2019 12:00 PM  Distal Lumen Status Flushed;Saline locked;Blood return noted 03/17/2019 12:00 PM  Dressing Type Transparent;Occlusive 03/17/2019 12:00 PM  Dressing Status Clean;Dry;Intact 03/17/2019 12:00 PM  Line Care Connections checked and tightened 03/17/2019 12:00 PM  Dressing Intervention Other (Comment) 03/17/2019 12:00 PM  Dressing Change Due 03/20/19 03/17/2019 12:00 PM     NG/OG Tube Nasogastric Left nare Documented cm marking at nare/ corner of mouth 75 cm (Active)  Cm Marking at Nare/Corner of Mouth (if applicable) 75 cm 05/12/5363  4:00 PM  Site Assessment Clean;Dry;Intact 03/17/2019  4:00 PM  Ongoing Placement Verification No change in cm markings or external length of tube from initial placement;No change in respiratory status;No acute changes, not attributed to clinical condition 03/17/2019  4:00 PM  Status Suction-low intermittent 03/17/2019  4:00 PM  Drainage Appearance Brown 03/17/2019  4:00 PM  Output (mL) 50 mL 03/17/2019  4:00 PM     Urethral Catheter Wells Guiles RN Non-latex 14 Fr. (Active)  Indication for Insertion or Continuance of Catheter Therapy based on hourly urine output monitoring and documentation for critical condition (NOT STRICT I&O) 03/17/2019  4:00 PM  Site  Assessment Clean;Intact;Dry 03/17/2019  4:00 PM  Catheter Maintenance Bag below level of bladder;Catheter secured;Drainage bag/tubing not touching floor;No dependent loops;Insertion date on drainage bag;Seal intact 03/17/2019  4:00 PM  Collection Container Standard drainage bag 03/17/2019  4:00 PM  Securement Method Leg strap 03/17/2019  4:00 PM  Urinary Catheter Interventions Unclamped 03/17/2019  4:00 PM  Output (mL) 100 mL 03/17/2019  4:00 PM    Anti-infectives:  Anti-infectives (From admission, onward)   Start     Dose/Rate Route Frequency Ordered Stop   03/19/19 1400  piperacillin-tazobactam (ZOSYN) IVPB 3.375 g     3.375 g 12.5 mL/hr over 240 Minutes Intravenous Every 12 hours 03/19/19 1353 03/24/19 2359   03/19/19 0830  erythromycin 500 mg in sodium chloride 0.9 % 100 mL IVPB     500 mg 100 mL/hr over 60 Minutes Intravenous Every 8 hours 03/19/19 0824 03/22/19 0259   03/18/19 0200  Ampicillin-Sulbactam (UNASYN) 3 g in sodium chloride 0.9 % 100 mL IVPB  Status:  Discontinued     3 g 200 mL/hr over 30 Minutes Intravenous Every 12 hours 03/18/19 0044 03/19/19 1353   03/17/19 2100  metroNIDAZOLE (FLAGYL) IVPB 500 mg  Status:  Discontinued     500 mg 100 mL/hr over 60 Minutes Intravenous Every 8 hours 03/17/19 2048 03/18/19 0031   03/11/19 1800  cefTRIAXone (ROCEPHIN) 2 g in sodium chloride 0.9 % 100 mL IVPB  Status:  Discontinued     2 g 200 mL/hr over 30 Minutes Intravenous Every 24 hours 03/11/19 1243 03/18/19 0048   03/11/19 1200  vancomycin (VANCOCIN) 1,500 mg in sodium chloride 0.9 % 500 mL IVPB  Status:  Discontinued     1,500 mg 250  mL/hr over 120 Minutes Intravenous  Once 03/11/19 1020 03/11/19 1032   03/11/19 1200  vancomycin (VANCOCIN) 1,500 mg in sodium chloride 0.9 % 500 mL IVPB     1,500 mg 250 mL/hr over 120 Minutes Intravenous  Once 03/11/19 1030 03/11/19 1333   03/10/19 1200  vancomycin (VANCOCIN) 1,500 mg in sodium chloride 0.9 % 500 mL IVPB  Status:  Discontinued     1,500  mg 250 mL/hr over 120 Minutes Intravenous  Once 03/10/19 1055 03/10/19 1114   03/10/19 1200  vancomycin (VANCOCIN) 1,500 mg in sodium chloride 0.9 % 500 mL IVPB     1,500 mg 250 mL/hr over 120 Minutes Intravenous  Once 03/10/19 1114 03/10/19 1345   03/09/19 0515  piperacillin-tazobactam (ZOSYN) IVPB 3.375 g  Status:  Discontinued     3.375 g 12.5 mL/hr over 240 Minutes Intravenous Every 8 hours 03/09/19 0507 03/11/19 1243      Microbiology: Results for orders placed or performed during the hospital encounter of 03/08/19  SARS Coronavirus 2 (CEPHEID - Performed in [place] hospital lab), Hosp Order     Status: None   Collection Time: 03/08/19 11:48 PM   Specimen: Nasopharyngeal Swab  Result Value Ref Range Status   SARS Coronavirus 2 NEGATIVE NEGATIVE Final    Comment: (NOTE) If result is NEGATIVE SARS-CoV-2 target nucleic acids are NOT DETECTED. The SARS-CoV-2 RNA is generally detectable in upper and lower  respiratory specimens during the acute phase of infection. The lowest  concentration of SARS-CoV-2 viral copies this assay can detect is 250  copies / mL. A negative result does not preclude SARS-CoV-2 infection  and should not be used as the sole basis for treatment or other  patient management decisions.  A negative result may occur with  improper specimen collection / handling, submission of specimen other  than nasopharyngeal swab, presence of viral mutation(s) within the  areas targeted by this assay, and inadequate number of viral copies  (<250 copies / mL). A negative result must be combined with clinical  observations, patient history, and epidemiological information. If result is POSITIVE SARS-CoV-2 target nucleic acids are DETECTED. The SARS-CoV-2 RNA is generally detectable in upper and lower  respiratory specimens dur ing the acute phase of infection.  Positive  results are indicative of active infection with SARS-CoV-2.  Clinical  correlation with patient  history and other diagnostic information is  necessary to determine patient infection status.  Positive results do  not rule out bacterial infection or co-infection with other viruses. If result is PRESUMPTIVE POSTIVE SARS-CoV-2 nucleic acids MAY BE PRESENT.   A presumptive positive result was obtained on the submitted specimen  and confirmed on repeat testing.  While 2019 novel coronavirus  (SARS-CoV-2) nucleic acids may be present in the submitted sample  additional confirmatory testing may be necessary for epidemiological  and / or clinical management purposes  to differentiate between  SARS-CoV-2 and other Sarbecovirus currently known to infect humans.  If clinically indicated additional testing with an alternate test  methodology [id number]) is advised. The SARS-CoV-2 RNA is generally  detectable in upper and lower respiratory sp ecimens during the acute  phase of infection. The expected result is Negative. Fact Sheet for Patients:  StrictlyIdeas.no Fact Sheet for Healthcare Providers: BankingDealers.co.za This test is not yet approved or cleared by the [place] FDA and has been authorized for detection and/or diagnosis of SARS-CoV-2 by FDA under an Emergency Use Authorization (EUA).  This EUA will remain in effect (meaning this  test can be used) for the duration of the COVID-19 declaration under Section 564(b)(1) of the Act, 21 U.S.C. section 360bbb-3(b)(1), unless the authorization is terminated or revoked sooner. Performed at [facility], [address] [place], [place] [address]   MRSA PCR Screening     Status: None   Collection Time: 03/09/19  2:48 AM   Specimen: Nasal Mucosa; Nasopharyngeal  Result Value Ref Range Status   MRSA by PCR NEGATIVE NEGATIVE Final    Comment:        The GeneXpert MRSA Assay (FDA approved for NASAL specimens only), is one component of a comprehensive MRSA  colonization surveillance program. It is not intended to diagnose MRSA infection nor to guide or monitor treatment for MRSA infections. Performed at [facility], [address] [place], [place] [address]   Culture, respiratory (non-expectorated)     Status: None   Collection Time: 03/09/19  8:00 AM   Specimen: Tracheal Aspirate; Respiratory  Result Value Ref Range Status   Specimen Description   Final    TRACHEAL ASPIRATE Performed at [facility], [address] [place], [place] [address]    Special Requests   Final    NONE Performed at [facility], [address] [place], [place] [address]    Gram Stain   Final    ABUNDANT WBC PRESENT,BOTH PMN AND MONONUCLEAR MODERATE GRAM POSITIVE COCCI RARE YEAST    Culture   Final    MODERATE GROUP B STREP(S.AGALACTIAE)ISOLATED TESTING AGAINST S. AGALACTIAE NOT ROUTINELY PERFORMED DUE TO PREDICTABILITY OF AMP/PEN/VAN SUSCEPTIBILITY. Performed at [facility], [place] [address] [place], [place] [address]    Report Status 03/11/2019 FINAL  Final  CULTURE, BLOOD (ROUTINE X 2) w Reflex to ID Panel     Status: None   Collection Time: 03/09/19  2:53 PM   Specimen: BLOOD  Result Value Ref Range Status   Specimen Description   Final    BLOOD BLOOD RIGHT ARM Performed at [facility], [address] [place], [place] [address]    Special Requests   Final    BOTTLES DRAWN AEROBIC AND ANAEROBIC Blood Culture results may not be optimal due to an excessive volume of blood received in culture bottles Performed at [facility], [address] [place], [place] [address]    Culture   Final    NO GROWTH 5 DAYS Performed at [facility], [place] [address] [place], [place] [address]    Report Status 03/17/2019 FINAL  Final  CULTURE, BLOOD (ROUTINE X 2) w Reflex to ID Panel     Status: None   Collection Time: 03/09/19  5:00 PM   Specimen: BLOOD RIGHT ARM  Result Value Ref Range Status    Specimen Description   Final    BLOOD RIGHT ARM Performed at [facility], [address] [place], [place] [address]    Special Requests   Final    BOTTLES DRAWN AEROBIC AND ANAEROBIC Blood Culture results may not be optimal due to an excessive volume of blood received in culture bottles Performed at [facility], [address], [place], [place] [address]    Culture   Final    NO GROWTH 5 DAYS Performed at [facility], [place] [address] [place],  [address]    Report Status 03/15/2019 FINAL  Final  Urine Culture     Status: None   Collection Time: 03/11/19  4:33 AM   Specimen:  Nasal Mucosa; Urine  Result Value Ref Range Status   Specimen Description   Final    URINE, RANDOM Performed at [facility], [address], [place], [place] [address]    Special Requests   Final    NONE Performed at [facility], [address], [place], [place] [address]    Culture   Final    NO GROWTH Performed at [facility], [place] [address], [place], [place] [address]    Report Status 03/12/2019 FINAL  Final    Best Practice/Protocols:  VTE Prophylaxis: Heparin (SQ)   Events: 5/31 admitted for severe shock and resp failure DRUG OD 6/1 started CRRT multiorgan failure, resp failure, vasopressors 6/2 severe hypoxia UF rates increased to 500 6/3 DNR, multiorgan failure 6/4 CODE STATUS changed to full code,high risk for reintubation severe hypoxia on CRRT 6/5 high flow [place], on CRRT, high risk for intubation 6/7 resp failure resolved, off CRRT, transfer to gen med floor 6/9 readmitted to ICU due to respiratory distress, abdominal distention due to ileus. 6/11 reintubated due to acute pulmonary edema from volume overload. 6/12 new right IJ placed right femoral CVL removed 6/14 extubated 6/15 right IJ removed, PIV in place.  Studies: Ct Abdomen Pelvis Wo Contrast  Result Date: 03/17/2019 CLINICAL DATA:  Overdose, abdominal distension  EXAM: CT ABDOMEN AND PELVIS WITHOUT CONTRAST TECHNIQUE: Multidetector CT imaging of the abdomen and pelvis was performed following the standard protocol without IV contrast. COMPARISON:  04/27/2007 FINDINGS: Lower chest: Small bilateral pleural effusions and associated atelectasis or consolidation. Hepatobiliary: No focal liver abnormality is seen. Status post cholecystectomy. No biliary dilatation. Pancreas: Unremarkable. No pancreatic ductal dilatation or surrounding inflammatory changes. Spleen: Normal in size without significant abnormality. Adrenals/Urinary Tract: Adrenal glands are unremarkable. Kidneys are normal, without renal calculi, solid lesion, or hydronephrosis. Foley catheter in the bladder Stomach/Bowel: Appendix is surgically absent. The stomach, small bowel, and colon are diffusely distended by air and fluid, with fluid present to the rectum. Vascular/Lymphatic: Right femoral central venous catheter. No enlarged abdominal or pelvic lymph nodes. Reproductive: No mass or other significant abnormality. Other: No abdominal wall hernia or abnormality. Small volume ascites. Anasarca. Musculoskeletal: No acute or significant osseous findings. IMPRESSION: 1. The stomach, small bowel, and colon are diffusely distended by air and fluid, with fluid present to the rectum. Findings are consistent with ileus. 2.  Pleural effusions, ascites, and anasarca. 3.  Status post cholecystectomy and appendectomy. Electronically Signed   By: Eddie Candle M.D.   On: 03/17/2019 08:35   Dg Abd 1 View  Result Date: 03/21/2019 CLINICAL DATA:  Ileus follow-up. EXAM: ABDOMEN - 1 VIEW COMPARISON:  March 19, 2019 FINDINGS: Air-filled dilated loops of primarily: Remain, similar to mildly improved in the interval. No other interval changes. An NG tube terminates in the left upper quadrant. IMPRESSION: 1. Mild improvement in ileus. 2. The NG tube terminates in the left upper quadrant. Electronically Signed   By: Dorise Bullion III  M.D   On: 03/21/2019 08:14   Dg Abd 1 View  Result Date: 03/19/2019 CLINICAL DATA:  Follow-up ileus EXAM: ABDOMEN - 1 VIEW COMPARISON:  03/18/2019 FINDINGS: Gastric catheter is again noted within the stomach. Right femoral central line is noted. Diffuse gaseous distension of the colon is again seen with improved appearance of the small bowel with only a few mildly prominent loops identified. No free air is seen. No bony abnormality is noted. IMPRESSION: Improving ileus with only colonic distension on the  current exam. Electronically Signed   By: Inez Catalina M.D.   On: 03/19/2019 08:06   Dg Abd 1 View  Result Date: 03/18/2019 CLINICAL DATA:  Follow-up ileus EXAM: ABDOMEN - 1 VIEW COMPARISON:  Film from the previous day. FINDINGS: Gastric catheter is noted coiled within the stomach. There remains scattered large and small bowel gas. Some progression and small-bowel dilatation is noted when compare with the previous day. Right femoral central line is seen. No bony abnormality is noted. IMPRESSION: Gaseous distension of the large and small bowel with some small bowel dilatation. This would be consistent with a generalized ileus. Correlation with the physical exam is recommended. Electronically Signed   By: Inez Catalina M.D.   On: 03/18/2019 07:31   Dg Abd 1 View  Result Date: 03/17/2019 CLINICAL DATA:  Check gastric catheter placement EXAM: ABDOMEN - 1 VIEW COMPARISON:  None. FINDINGS: Gastric catheter is noted coiled within the stomach. Scattered large and small bowel gas is seen similar to that seen on prior CT examination. No free air is noted. IMPRESSION: Gastric catheter coiled within the stomach. Electronically Signed   By: Inez Catalina M.D.   On: 03/17/2019 10:09   Dg Abd 1 View  Result Date: 03/14/2019 CLINICAL DATA:  28 year old male with history of abdominal pain. EXAM: ABDOMEN - 1 VIEW COMPARISON:  03/09/2019. FINDINGS: Right femoral central venous catheter with tip projecting over the expected  location of the proximal right common femoral vein. Gas and stool are seen scattered throughout the colon extending to the level of the distal rectum. No pathologic distension of small bowel is noted. Several nondilated gas-filled loops of small bowel or noted projecting over the central abdomen. No gross evidence of pneumoperitoneum. IMPRESSION: 1. Nonspecific, nonobstructive bowel gas pattern, as above. 2. No pneumoperitoneum. Electronically Signed   By: Vinnie Langton M.D.   On: 03/14/2019 22:34   Dg Abd 1 View  Result Date: 03/09/2019 CLINICAL DATA:  28 year old male presenting after drug overdose. EXAM: ABDOMEN - 1 VIEW COMPARISON:  Portable chest today. CT Abdomen and Pelvis 05/02/2007. FINDINGS: Semi upright AP view at 0510 hours. Enteric tube side hole is at the level of the gastric body. Nonobstructed bowel-gas pattern. No osseous abnormality identified. IMPRESSION: Enteric tube side hole at the level of the gastric body. Normal visible bowel gas pattern. Electronically Signed   By: Genevie Ann M.D.   On: 03/09/2019 05:46   Ct Head Wo Contrast  Result Date: 03/13/2019 CLINICAL DATA:  Drug overdose EXAM: CT HEAD WITHOUT CONTRAST TECHNIQUE: Contiguous axial images were obtained from the base of the skull through the vertex without intravenous contrast. COMPARISON:  Head CT 12/29/2006 FINDINGS: Brain: There is no mass, hemorrhage or extra-axial collection. The size and configuration of the ventricles and extra-axial CSF spaces are normal. The brain parenchyma is normal, without acute or chronic infarction. Vascular: No abnormal hyperdensity of the major intracranial arteries or dural venous sinuses. No intracranial atherosclerosis. Skull: The visualized skull base, calvarium and extracranial soft tissues are normal. Sinuses/Orbits: No fluid levels or advanced mucosal thickening of the visualized paranasal sinuses. No mastoid or middle ear effusion. The orbits are normal. IMPRESSION: Normal head CT.  Electronically Signed   By: Ulyses Jarred M.D.   On: 03/13/2019 02:30   Nm Pulmonary Perf And Vent  Result Date: 03/16/2019 CLINICAL DATA:  Shortness of breath EXAM: NUCLEAR MEDICINE VENTILATION - PERFUSION LUNG SCAN VIEWS: Anterior and posterior: Ventilation and perfusion. Patient was unable to tolerate additional imaging. RADIOPHARMACEUTICALS:  32.1 mCi of Tc-70m DTPA aerosol inhalation and 4.38 mCi Tc33m MAA IV COMPARISON:  Chest radiograph March 15, 2017 FINDINGS: Ventilation: Radiotracer uptake is homogeneous and symmetric bilaterally. No evident ventilation defects. Perfusion: Radiotracer uptake is homogeneous and symmetric bilaterally. No perfusion defects evident. IMPRESSION: No ventilation or perfusion defects. Very low probability of pulmonary embolus. Electronically Signed   By: Lowella Grip III M.D.   On: 03/16/2019 13:22   Dg Chest Port 1 View  Result Date: 03/22/2019 CLINICAL DATA:  Acute respiratory failure EXAM: PORTABLE CHEST 1 VIEW COMPARISON:  Radiograph 03/22/2019 FINDINGS: Interval extubation. NG tube remains in the stomach. Central venous lines in the mid SVC unchanged. Normal cardiac silhouette. Diffuse bilateral nodular airspace disease is not improved. Increased density in the RIGHT lower lobe. No pneumothorax IMPRESSION: Is 1. No improvement in diffuse bilateral nodular airspace disease. Some increased focality in the RIGHT lower lobe. 2. Extubation. 3. Additional support apparatus stable. Electronically Signed   By: Suzy Bouchard M.D.   On: 03/22/2019 20:01   Dg Chest Port 1 View  Result Date: 03/22/2019 CLINICAL DATA:  Acute respiratory failure. Endotracheally intubated. EXAM: PORTABLE CHEST 1 VIEW COMPARISON:  03/20/2019 FINDINGS: Support lines and tubes in appropriate position. Low lung volumes are again seen. Moderate diffuse pulmonary airspace disease shows no significant change. Heart size is stable. IMPRESSION: Moderate diffuse pulmonary airspace disease, without  significant change. Electronically Signed   By: Earle Gell M.D.   On: 03/22/2019 07:34   Dg Chest Port 1 View  Result Date: 03/20/2019 CLINICAL DATA:  Central line placement EXAM: PORTABLE CHEST 1 VIEW COMPARISON:  03/19/2019 FINDINGS: There is a newly placed right IJ central venous catheter with tip projecting over the proximal SVC. The left-sided central venous catheter is stable in positioning. The endotracheal tube terminates above the carina. The enteric tube extends below the left hemidiaphragm. There are diffuse bilateral hazy airspace opacities with no significant interval improvement. No pneumothorax. The heart size is stable. IMPRESSION: 1. Newly placed right-sided central venous catheter tip terminates over the proximal SVC. There is no pneumothorax. The remaining lines and tubes are as above. 2. Otherwise, no significant interval change in appearance of the lungs. Electronically Signed   By: Constance Holster M.D.   On: 03/20/2019 01:19   Dg Chest Port 1 View  Result Date: 03/19/2019 CLINICAL DATA:  Status post intubation EXAM: PORTABLE CHEST 1 VIEW COMPARISON:  03/19/2019 FINDINGS: Endotracheal tube is now seen in satisfactory position. Gastric catheter and left jugular central line are again seen. Cardiac shadow is stable. Stable bilateral infiltrates. IMPRESSION: Endotracheal tube in satisfactory position. The remainder of the exam is stable. Electronically Signed   By: Inez Catalina M.D.   On: 03/19/2019 08:07   Dg Chest Port 1 View  Result Date: 03/19/2019 CLINICAL DATA:  Acute respiratory failure EXAM: PORTABLE CHEST 1 VIEW COMPARISON:  03/18/2019 FINDINGS: Cardiac shadow is stable. Gastric catheter and left jugular central line are again seen and stable. Patchy bilateral infiltrates are again identified and stable given some technical variation in the film. No sizable effusion or pneumothorax is noted. No bony abnormality is seen. IMPRESSION: Stable patchy infiltrates bilaterally.  Electronically Signed   By: Inez Catalina M.D.   On: 03/19/2019 08:04   Dg Chest Port 1 View  Result Date: 03/18/2019 CLINICAL DATA:  Acute respiratory failure EXAM: PORTABLE CHEST 1 VIEW COMPARISON:  03/17/2019 FINDINGS: Cardiac shadow is stable. Left jugular central line is again seen and stable. Gastric catheter is noted  coiled within the stomach. Patchy infiltrates are noted bilaterally stable from the prior exam. No new focal abnormality is noted. IMPRESSION: Stable bilateral infiltrates. Electronically Signed   By: Inez Catalina M.D.   On: 03/18/2019 07:30   Dg Chest Port 1 View  Result Date: 03/17/2019 CLINICAL DATA:  Respiratory failure, distress EXAM: PORTABLE CHEST 1 VIEW COMPARISON:  03/16/2019 FINDINGS: Left central line is unchanged. NG tube placement into the stomach. Bilateral interstitial and airspace opacities are similar prior study. Mild cardiomegaly. Small bilateral effusions. No acute bony abnormality. IMPRESSION: Diffuse interstitial and airspace opacities are similar prior study. Small effusions. Electronically Signed   By: Rolm Baptise M.D.   On: 03/17/2019 10:08   Dg Chest Port 1 View  Result Date: 03/16/2019 CLINICAL DATA:  Chest pain. EXAM: PORTABLE CHEST 1 VIEW COMPARISON:  One-view chest x-ray 03/13/2019 FINDINGS: A left IJ line is stable. Heart size is exaggerated by low lung volumes. Patchy bilateral interstitial and airspace opacities have increased. Airspace disease is asymmetric on the left. No definite effusions are present. Visualized soft tissues and bony thorax are unremarkable. IMPRESSION: 1. Increasing interstitial and airspace opacities, left greater than right. Findings are compatible with ARDS or infection. 2. Stable left IJ line. Electronically Signed   By: San Morelle M.D.   On: 03/16/2019 04:57   Dg Chest Port 1 View  Result Date: 03/13/2019 CLINICAL DATA:  Acute respiratory failure EXAM: PORTABLE CHEST 1 VIEW COMPARISON:  03/11/2019 FINDINGS: The  left-sided catheter tip projects over the SVC. The heart size is stable from prior study. There is worsening vascular congestion in worsening bilateral airspace opacities. There are likely bilateral pleural effusions. No evidence of a pneumothorax. The patient has been extubated. Enteric tube has been removed. IMPRESSION: 1. Status post extubation. 2. Remaining lines and tubes as above. 3. Worsening vascular congestion. Persistent small bilateral pleural effusions. Electronically Signed   By: Constance Holster M.D.   On: 03/13/2019 03:46   Dg Chest Port 1 View  Result Date: 03/11/2019 CLINICAL DATA:  Acute respiratory failure EXAM: PORTABLE CHEST 1 VIEW COMPARISON:  03/10/2019 FINDINGS: Cardiac shadow is stable. Endotracheal tube, gastric catheter and left jugular central line are again seen and stable. Increasing vascular congestion is noted when compare with the prior exam with enlarging left pleural effusion. No focal confluent infiltrate is seen. IMPRESSION: Worsening vascular congestion with left-sided pleural effusion. Tubes and lines as described. Electronically Signed   By: Inez Catalina M.D.   On: 03/11/2019 07:07   Dg Chest Port 1 View  Result Date: 03/10/2019 CLINICAL DATA:  Hypoxia.  Patient on a ventilator. EXAM: PORTABLE CHEST 1 VIEW COMPARISON:  Earlier film, same date. FINDINGS: The endotracheal tube is in good position, 5 cm above the carina. The NG tube is coursing down the esophagus and into the stomach. The left IJ central venous catheter is stable. The lungs demonstrate improved aeration compared to the earlier film. Less edema and atelectasis. No pneumothorax. IMPRESSION: 1. Support apparatus in good position without complicating features. 2. Improved lung aeration since earlier chest film. Electronically Signed   By: Marijo Sanes M.D.   On: 03/10/2019 20:35   Portable Chest Xray  Result Date: 03/10/2019 CLINICAL DATA:  Respiratory failure EXAM: PORTABLE CHEST 1 VIEW COMPARISON:   03/09/2019 FINDINGS: Endotracheal tube tip at the clavicular heads. The orogastric tube at least reaches the stomach. Left IJ dialysis catheter tip at the upper cavoatrial junction. Worsening aeration with obscured left diaphragm. There is diffuse hazy lung  opacity. Suspect layering pleural effusions. Cardiomegaly and vascular pedicle widening. IMPRESSION: Stable, unremarkable hardware positioning. Worsening aeration, likely atelectasis, layering fluid, and edema. Electronically Signed   By: Monte Fantasia M.D.   On: 03/10/2019 06:22   Dg Chest Port 1 View  Result Date: 03/09/2019 CLINICAL DATA:  Central line placement EXAM: PORTABLE CHEST 1 VIEW COMPARISON:  03/09/2019 FINDINGS: The endotracheal tube terminates above the carina. The newly placed left-sided central venous catheter tip projects over the SVC/distal left brachiocephalic vein. The enteric tube extends below the left hemidiaphragm. There is no left-sided pneumothorax. The heart size is stable. No right-sided pneumothorax. Coarsened lung markings are again identified bilaterally. IMPRESSION: 1. Newly placed left-sided central venous catheter as above with no evidence of a pneumothorax. 2. Remaining lines and tubes as above. 3. Stable cardiac size. Persistent coarse airspace opacities bilaterally similar to prior study. Electronically Signed   By: Constance Holster M.D.   On: 03/09/2019 19:58   Dg Chest Port 1 View  Result Date: 03/09/2019 CLINICAL DATA:  28 year old male presenting after drug overdose. EXAM: PORTABLE CHEST 1 VIEW COMPARISON:  02/09/2019 and earlier. FINDINGS: Portable AP semi upright view at 0510 hours. Intubated, endotracheal tube tip at the level the clavicles. Enteric tube courses to the abdomen, tip not included. Normal cardiac size and mediastinal contours. Patchy and confluent perihilar and lung base opacity. No superimposed pneumothorax. No overt edema. No pleural effusion identified. Paucity of bowel gas in the upper  abdomen. No osseous abnormality identified. IMPRESSION: 1. Endotracheal tube tip at the level the clavicles. Enteric tube courses to the abdomen, tip not included. 2. Patchy and confluent perihilar and lung base opacity. Consider aspiration. No pulmonary edema or pleural effusion. Electronically Signed   By: Genevie Ann M.D.   On: 03/09/2019 05:45   [place] Ekg Site Rite  Result Date: 03/19/2019 If Site Rite image not attached, placement could not be confirmed due to current cardiac rhythm.   Consults: Treatment Team:  Murlean Iba, MD Lavella Hammock, MD [facility], [facility], MD   Subjective:    Overnight Issues: Requiring BiPAP this morning due to desaturations and increased work of breathing.  Had Lasix last night.  He is lethargic with minimal engagement.  Switch to AVAPS with improvement in saturations.  Will need dialysis today due to volume overload.   Objective:  Vital signs for last 24 hours: Temp:  [98 F (36.7 C)-99.6 F (37.6 C)] 99.5 F (37.5 C) (06/15 1000) Pulse Rate:  [71-107] 74 (06/15 1245) Resp:  [19-42] 21 (06/15 1245) BP: (119-193)/(65-94) 129/79 (06/15 1245) SpO2:  [84 %-100 %] 97 % (06/15 1245) FiO2 (%):  [45 %-100 %] 100 % (06/15 0900) Weight:  [96.3 kg] 96.3 kg (06/15 1000)  Hemodynamic parameters for last 24 hours:    Intake/Output from previous day: 06/14 0701 - 06/15 0700 In: 440.3 [I.V.:137.5; IV Piggyback:302.9] Out: 2275 [Urine:2275]  Intake/Output this shift: No intake/output data recorded.  Vent settings for last 24 hours: FiO2 (%):  [45 %-100 %] 100 %  Physical Exam:  General: Overweight male,on AVAPS HEENT: PERRLA, trachea midline, thick neck  Neuro:  Lethargic, no focal deficits Cardiovascular:  pulse regular, S1-S2, no murmur regurg or gallop, +2 pulses bilaterally, +2 anasarca  Lungs: Scattered rhonchi.  Some basilar atelectatic crackles.  No increased use of accessories today. Abdomen: Soft, obese, sluggish bowel sounds but present   Musculoskeletal: No joint deformities, positive range of motion Skin: Warm and dry, no rash or lesions  Assessment/Plan:  1.  Acute respiratory failure: Has high FiO2 requirements.  Tolerating AVAPS.  Persistent issues with volume overload.  Recommend significant volume removal during dialysis.  Discussed with Dr. Juleen China.  2.  Ileus: Replace electrolytes as needed.  Overall improved.  3.  Acute renal failure: Nonoliguric.  Continue Foley catheter due to need for strict I&O's.  Received diuretic with good response.  However still needs more volume removal.  Hemodialysis today due to increased FiO2 requirements and volume overload on x-ray and exam.  4.  Group B strep pneumonia: Day 6 of antibiotics, discontinue a day 7.  Currently on ceftriaxone.  5.  Nausea and vomiting: Resolved.     LOS: 14 days   Additional comments: Multidisciplinary rounds were performed with the ICU team.    Critical Care Total Time*:   C. Derrill Kay, MD [facility] 03/23/2019  *Care during the described time interval was provided by me and/or other providers on the critical care team.  I have reviewed this patient's available data, including medical history, events of note, physical examination and test results as part of my evaluation.

## 2019-03-23 NOTE — Progress Notes (Addendum)
Patient changed to high flow nasal cannula and tolerating without complication.  Vitals remain stable and patient verbalized feeling more comfortable. Mouth care given.

## 2019-03-23 NOTE — Progress Notes (Signed)
Pt in respiratory distress upon entering room at shift change. Pt was tachypnic, hypoxic (sats mid 80s), and lungs had coarse crackles in all lobes. Bipap administered and orders placed for ABG, CXR, and 60 IV mg lasix. Resp distress resolved shortly after Bipap application. Pt is producing urine on lasix and does appear to be in any distress at this time. Will continue to monitor.

## 2019-03-23 NOTE — Progress Notes (Signed)
PHARMACIST - PHYSICIAN COMMUNICATION  CONCERNING: IV to Oral Route Change Policy  RECOMMENDATION: This patient is receiving pantoprazole by the intravenous route.  Based on criteria approved by the Pharmacy and Therapeutics Committee, the intravenous medication(s) is/are being converted to the equivalent oral dose form(s).   DESCRIPTION: These criteria include:  The patient is eating (either orally or via tube) and/or has been taking other orally administered medications for a least 24 hours  The patient has no evidence of active gastrointestinal bleeding or impaired GI absorption (gastrectomy, short bowel, patient on TNA or NPO).  If you have questions about this conversion, please contact the Pharmacy Department   []   [phone] )  [place], [facility] 03/23/2019 7:45 AM

## 2019-03-23 NOTE — Progress Notes (Signed)
Post HD [facility]    03/23/19 1335  Hand-Off documentation  Report given to (Full Name) Margart Sickles, RN   Vital Signs  Temp 98.4 F (36.9 C)  Temp Source Oral  Pulse Rate 70  Pulse Rate Source Monitor  Resp (!) 22  BP 129/73  Oxygen Therapy  SpO2 99 %  O2 Device Bi-PAP  Pulse Oximetry Type Continuous  Pain Assessment  Pain Scale 0-10  Pain Score 0  Dialysis Weight  Weight 93.1 kg  Type of Weight Post-Dialysis  Post-Hemodialysis Assessment  Rinseback Volume (mL) 250 mL  KECN 60.9 V  Dialyzer Clearance Lightly streaked  Duration of HD Treatment -hour(s) 3 hour(s)  Hemodialysis Intake (mL) 500 mL  UF Total -Machine (mL) 4000 mL  Net UF (mL) 3500 mL  Tolerated HD Treatment Yes  Hemodialysis Catheter Left Internal jugular Double-lumen  Placement Date/Time: 03/09/19 2037   Placed prior to admission: No  Time Out: Correct patient;Correct site;Correct procedure  Maximum sterile barrier precautions: Sterile gown;Mask;Cap;Large sterile sheet;Sterile gloves;Hand hygiene  Site Prep: Chlorh...  Site Condition No complications  Blue Lumen Status Heparin locked  Red Lumen Status Heparin locked  Post treatment catheter status Capped and Clamped

## 2019-03-23 NOTE — Progress Notes (Signed)
   03/23/19 1500  Clinical Encounter Type  Visited With Patient  Visit Type Follow-up  Referral From Nurse  Consult/Referral To Chaplain  Spiritual Encounters  Spiritual Needs Prayer;Emotional  Stress Factors  Patient Stress Factors Exhausted;Major life changes  Chaplain received page from nurse to come visit patient. Chaplain arrived and patient was lying in bed, nurse tech was in the room. Chaplain introduce herself and patient smile. Patient looked tired,so Chaplain ask was this a good time to visit and patient said yes. Nurse tech left the room so patient could be alone with Chaplain. 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 Patient was telling stories of him not being able to fix things. Chaplain had patient to share about the things that he did accomplish. 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 The Chaplain had him to see the good in what he had done and how we all have experience things that we cannot change. Chaplain asked patient why does he feel it was his responsibility to fix everything and patient said "It just is". Patient share encouraging words and prayed with patient and patient was appreciative. Follow up visit are welcomed.

## 2019-03-23 NOTE — Progress Notes (Signed)
Pre HD Assessment    03/23/19 1000  Neurological  Level of Consciousness Alert  Orientation Level Oriented to person  Respiratory  Respiratory Pattern Labored  Chest Assessment Chest expansion symmetrical  Bilateral Breath Sounds Rhonchi  Cough None  Cardiac  Pulse Regular  Heart Sounds S1, S2  Jugular Venous Distention (JVD) No  ECG Monitor Yes  Cardiac Rhythm NSR  Vascular  R Radial Pulse +2  L Radial Pulse +2  Generalized Edema +2  Psychosocial  Psychosocial (WDL) WDL  Patient Behaviors Calm;Appropriate for situation

## 2019-03-23 NOTE — Progress Notes (Signed)
HD TX started w/o complication    86/76/19 1030  Vital Signs  Pulse Rate 80  Pulse Rate Source Monitor  Resp (!) 29  BP 133/87  BP Location Right Arm  BP Method Automatic  Patient Position (if appropriate) Lying  Oxygen Therapy  SpO2 98 %  O2 Device Bi-PAP  Pulse Oximetry Type Continuous  During Hemodialysis Assessment  Blood Flow Rate (mL/min) 400 mL/min  Arterial Pressure (mmHg) -140 mmHg  Venous Pressure (mmHg) 160 mmHg  Transmembrane Pressure (mmHg) 60 mmHg  Ultrafiltration Rate (mL/min) 1170 mL/min  Dialysate Flow Rate (mL/min) 600 ml/min  Conductivity: Machine  14.1  HD Safety Checks Performed Yes  Dialysis Fluid Bolus Normal Saline  Bolus Amount (mL) 250 mL  Intra-Hemodialysis Comments Tx initiated

## 2019-03-23 NOTE — Progress Notes (Signed)
Pre HD Tx   03/23/19 1000  Hand-Off documentation  Report given to (Full Name) Beatris Ship, RN   Report received from (Full Name) Margart Sickles, RN   Vital Signs  Temp 99.5 F (37.5 C)  Temp Source Axillary  Pulse Rate 96  Pulse Rate Source Monitor  Resp 19  BP 135/82  BP Location Right Arm  BP Method Automatic  Patient Position (if appropriate) Lying  Oxygen Therapy  SpO2 98 %  O2 Device Bi-PAP  Pulse Oximetry Type Continuous  Pain Assessment  Pain Scale 0-10  Pain Score 0  Dialysis Weight  Weight 96.3 kg  Type of Weight Pre-Dialysis  Time-Out for Hemodialysis  What Procedure? HD  Pt Identifiers(min of two) First/Last Name;MRN/Account#  Correct Site? Yes  Correct Side? Yes  Correct Procedure? Yes  Consents Verified? Yes  Rad Studies Available? N/A  Safety Precautions Reviewed? Yes  Engineer, civil (consulting) Number 2  Station Number  (Bedside ICU 2 )  UF/Alarm Test Passed  Conductivity: Meter 14  Conductivity: Machine  14.1  pH 7.2  Reverse Osmosis Main  Normal Saline Lot Number [id number]  Dialyzer Lot Number [id number]  Disposable Set Lot Number [id number]  Machine Temperature 98.6 F (37 C)  Musician and Audible Yes  Blood Lines Intact and Secured Yes  Pre Treatment Patient Checks  Vascular access used during treatment Catheter  HD catheter dressing before treatment Peeling edges  Hepatitis B Surface Antigen Results Negative  Date Hepatitis B Surface Antigen Drawn 03/17/19  Hepatitis B Surface Antibody  (>10)  Date Hepatitis B Surface Antibody Drawn 03/17/19  Hemodialysis Consent Verified Yes  Hemodialysis Standing Orders Initiated Yes  ECG (Telemetry) Monitor On Yes  Prime Ordered Normal Saline  Length of  DialysisTreatment -hour(s) 3 Hour(s)  Dialysis Treatment Comments Na 140  Dialyzer Elisio 17H NR  Dialysate 2K;2.5 Ca  Dialysis Anticoagulant None  Dialysate Flow Ordered 600  Blood Flow Rate Ordered 400 mL/min  Ultrafiltration Goal 3  Liters (Verbal order change by MD )  Dialysis Blood Pressure Support Ordered Normal Saline  Education / Care Plan  Dialysis Education Provided No (Comment) (Pt non interactive )  Documented Education in Care Plan Yes  Hemodialysis Catheter Left Internal jugular Double-lumen  Placement Date/Time: 03/09/19 2037   Placed prior to admission: No  Time Out: Correct patient;Correct site;Correct procedure  Maximum sterile barrier precautions: Sterile gown;Mask;Cap;Large sterile sheet;Sterile gloves;Hand hygiene  Site Prep: Chlorh...  Site Condition No complications  Blue Lumen Status Blood return noted  Red Lumen Status Blood return noted  Purple Lumen Status Capped (Central line)  Dressing Type Biopatch;Occlusive  Dressing Status Clean;Dry;Intact  Interventions Dressing reinforced  Drainage Description None  Dressing Change Due 03/29/19

## 2019-03-23 NOTE — Progress Notes (Addendum)
Verbal report received from Glenn Ship, RN 3.5 liters removed with hemodialysis today and patient tolerated without difficulty. Lungs sounds have improved post dialysis and patient no longer noted to have increased work of breathing.  Dr. Patsey Berthold notified.  Patient to be trailed on high flow nasal cannula.

## 2019-03-23 NOTE — Progress Notes (Signed)
Patient respiratory rate decreased to 32-34, 02 sats 98% but patient continues with increased work of breathing. Heart rate decreased from 105 to 80"s. Patient reports feeling more comfortable.  Nurse still awaiting MD assessment.

## 2019-03-23 NOTE — Progress Notes (Signed)
Upon first assessment patient noted to have increased work of breathing, respiratory rate of 40-42, and very anxious.  Patient with bilateral rhonchi. 02 SATs 87-89%. Patient still alert and wanting to roll over.  When patient rolled to side he had stool on him and rectal tube leaking.  Rectal tube removed and patient cleaned.  Respiratory at bedside making changes to BiPAP machine as per MD request. Dr. Holley Raring also notified about patient. Per Dr. Holley Raring he would see patient and plan for patient to have hemodialysis.

## 2019-03-23 NOTE — Progress Notes (Addendum)
Patient with no output from NG this shift. Patient with nausea or vomiting since while tube has been clamped. Verbal order from Dr. Patsey Berthold to remove NG tube.

## 2019-03-23 NOTE — Progress Notes (Addendum)
[place] [facility] [place] NAME: Glenn Rasmussen    MR#:  [id number]  DATE OF BIRTH:  07/25/1991  SUBJECTIVE:   Patient on BiPAP due to failure to wean off BiPAP  REVIEW OF SYSTEMS:   CONSTITUTIONAL: No documented fever. No fatigue, weakness. No weight gain, no weight loss.  EYES: No blurry or double vision.  ENT: No tinnitus. No postnasal drip. No redness of the oropharynx.  RESPIRATORY: No cough, no wheeze, no hemoptysis.  Positive dyspnea.  CARDIOVASCULAR: No chest pain. No orthopnea. No palpitations. No syncope.  GASTROINTESTINAL: No nausea, no vomiting or diarrhea. No abdominal pain. No melena or hematochezia.  GENITOURINARY:  No urgency. No frequency. No dysuria. No hematuria. No obstructive symptoms. No discharge. No pain. No significant abnormal bleeding ENDOCRINE: No polyuria or nocturia. No heat or cold intolerance.  HEMATOLOGY: No anemia. No bruising. No bleeding. No purpura. No petechiae INTEGUMENTARY: No rashes. No lesions.  MUSCULOSKELETAL: No arthritis. No swelling. No gout.  NEUROLOGIC: No numbness, tingling, or ataxia. No seizure-type activity.  PSYCHIATRIC: No anxiety. No insomnia. No ADD.     DRUG ALLERGIES:  No Known Allergies  VITALS:  Blood pressure 126/82, pulse 75, temperature 99.5 F (37.5 C), temperature source Axillary, resp. rate (!) 24, height 5' 7.01" (1.702 m), weight 96.3 kg, SpO2 97 %.  PHYSICAL EXAMINATION:  Physical Exam   GENERAL:  28 y.o.-year-old critically ill-appearing patient lying in the bed on BiPAP EYES: Pupils equal, round, reactive to light and accommodation. No scleral icterus. Extraocular muscles intact.  HEENT: Head atraumatic, normocephalic. Oropharynx and nasopharynx clear.  NECK:  Supple, no jugular venous distention. No thyroid enlargement, no tenderness.   LUNGS: distant breath sounds bilaterally, no wheezing Mild use of accessory muscles of respiration.  Decreased bibasilar breath  sounds CARDIOVASCULAR: S1, S2 normal. No murmurs, rubs, or gallops.  ABDOMEN: Soft, nontender, nondistended. Bowel sounds present. No organomegaly or mass.  EXTREMITIES: No  cyanosis, or clubbing.  1+ pedal edema noted NEUROLOGIC: Patient sedated    PSYCHIATRIC: on the vent SKIN: No obvious rash, lesion, or ulcer.    LABORATORY PANEL:   CBC Recent Labs  Lab 03/23/19 0423  WBC 14.6*  HGB 7.5*  HCT 22.6*  PLT 394   ------------------------------------------------------------------------------------------------------------------  Chemistries  Recent Labs  Lab 03/23/19 0423  NA 143  K 4.9  CL 99  CO2 24  GLUCOSE 144*  BUN 141*  CREATININE 6.86*  CALCIUM 7.7*  MG 2.5*  AST 40  ALT 60*  ALKPHOS 97  BILITOT 0.8   ------------------------------------------------------------------------------------------------------------------  Cardiac Enzymes Recent Labs  Lab 03/16/19 1634  TROPONINI 0.26*   ------------------------------------------------------------------------------------------------------------------  RADIOLOGY:  Dg Chest Port 1 View  Result Date: 03/22/2019 CLINICAL DATA:  Acute respiratory failure EXAM: PORTABLE CHEST 1 VIEW COMPARISON:  Radiograph 03/22/2019 FINDINGS: Interval extubation. NG tube remains in the stomach. Central venous lines in the mid SVC unchanged. Normal cardiac silhouette. Diffuse bilateral nodular airspace disease is not improved. Increased density in the RIGHT lower lobe. No pneumothorax IMPRESSION: Is 1. No improvement in diffuse bilateral nodular airspace disease. Some increased focality in the RIGHT lower lobe. 2. Extubation. 3. Additional support apparatus stable. Electronically Signed   By: Suzy Bouchard M.D.   On: 03/22/2019 20:01   Dg Chest Port 1 View  Result Date: 03/22/2019 CLINICAL DATA:  Acute respiratory failure. Endotracheally intubated. EXAM: PORTABLE CHEST 1 VIEW COMPARISON:  03/20/2019 FINDINGS: Support lines and tubes  in appropriate position. Low lung volumes  are again seen. Moderate diffuse pulmonary airspace disease shows no significant change. Heart size is stable. IMPRESSION: Moderate diffuse pulmonary airspace disease, without significant change. Electronically Signed   By: Earle Gell M.D.   On: 03/22/2019 07:34    EKG:   Orders placed or performed during the hospital encounter of 03/08/19  . ED EKG  . ED EKG  . ED EKG  . ED EKG  . EKG 12-Lead  . EKG 12-Lead  . EKG 12-Lead  . EKG 12-Lead  . EKG 12-Lead  . EKG 12-Lead  . EKG 12-Lead  . EKG 12-Lead  . EKG 12-Lead  . EKG 12-Lead  . EKG 12-Lead  . EKG 12-Lead  . EKG 12-Lead  . EKG 12-Lead  . EKG - 12 lead  . EKG - 12 lead  . EKG 12-Lead  . EKG 12-Lead    ASSESSMENT AND PLAN:   28 year old male with past medical history significant for hypertension, IBS admitted secondary to overdose on Phenergan, losartan, Norvasc and Klonopin  1.  Acute Hypoxic respiratory failure due to volume overload/?ARDS -Patient is extubated on 03/12/2019--re-intubated on 03/19/2019, then extubated on 03/23/2019 now back on BiPAP -EKG--sinus tachycardia -VQ scan low probability for PE. D/ced heparin drip. -Continue hemodialysis  2.Intentional drug overdose -patient overdosed on 38 tablets of Phenergan, 19 tablets of losartan, 42 tablets of Norvasc and 78 tablets of Klonopin -Urine tox positive for benzos and tricyclics -Appreciate psych consult.  Once patient is medically stable, will need inpatient psychiatry admission.  3.  Acute renal failure-likely ATN from hypotension.  -received on CRRT, metabolic acidosis.  Appreciate nephrology consult Now having hemodialysis  4. Pneumonia-follow-up procalcitonin and WBC. - Currently on Iv Zosyn   5.  DVT prophylaxis- on heparin SQ  6.  GERD-Protonix  7.  Elevated troponin-likely demand ischemia, troponin plateaued vs due to renal failure -  Most recent echo this admission showing normal LV function and no  wall motion abnormalities.  8.Anemia appears due to current illness with renal failure No active bleed     CODE STATUS: Full code  TOTAL TIME TAKING CARE OF THIS PATIENT: 35 minutes.   POSSIBLE D/C IN ? DAYS, DEPENDING ON CLINICAL CONDITION.  Overall long-term prognosis poor  Dustin Flock M.D on 03/23/2019 at 1:29 PM  Between 7am to 6pm - Pager - [phone]  After 6pm go to [email or web address] - password [email or web address]  Office  [phone]  CC: Primary care physician; Maeola Sarah, MD

## 2019-03-23 NOTE — Progress Notes (Signed)
Patient with NG tube to low intermittent suction.  Dr. Patsey Berthold notified.  Per MD clamp NG and monitor patient for nausea and vomiting.

## 2019-03-23 NOTE — Progress Notes (Signed)
[facility]  ROUNDING NOTE   Subjective:   Extubated yesterday. However now back on BIPAP.   Sitter at bedside.   Furosemide 60mg  IV x 1 - UOP 2271mL.   Answering questions appropriately.   Objective:  Vital signs in last 24 hours:  Temp:  [98 F (36.7 C)-99.6 F (37.6 C)] 99.4 F (37.4 C) (06/15 0400) Pulse Rate:  [83-107] 88 (06/15 0800) Resp:  [18-42] 28 (06/15 0800) BP: (119-193)/(64-94) 121/77 (06/15 0800) SpO2:  [84 %-100 %] 99 % (06/15 0800) FiO2 (%):  [45 %-100 %] 100 % (06/15 0800)  Weight change:  Filed Weights   03/21/19 0428 03/21/19 1030 03/22/19 0457  Weight: 95.6 kg 95.6 kg 98.7 kg    Intake/Output: I/O last 3 completed shifts: In: 1181.9 [I.V.:659.1; NG/GT:220; IV Piggyback:302.9] Out: 2525 [Urine:2525]   Intake/Output this shift:  No intake/output data recorded.  Physical Exam: General: Critically ill  Head: BIPAP   Eyes: Anicteric, PERRL  Neck: LIJ HD catheter, RIJ tlc  Lungs:  Bilateral wheezing  Heart: tachycardia  Abdomen:  +distended, + hypoactive bowel sounds.   Extremities:  no peripheral edema.  Neurologic: Nonfocal, moving all four extremities  Skin: No lesions  Access: LIJ temp HD catheter 6/1     Basic Metabolic Panel: Recent Labs  Lab 03/18/19 1209 03/19/19 0541 03/19/19 1930 03/20/19 0343 03/21/19 0349 03/21/19 1100 03/22/19 0452 03/23/19 0423  NA  --  141  --  140 140  --  141 143  K  --  4.5  --  4.5 4.8  --  5.1 4.9  CL  --  99  --  101 100  --  95* 99  CO2  --  21*  --  25 21*  --  26 24  GLUCOSE  --  124*  --  167* 187*  --  172* 144*  BUN  --  91*  --  78* 113*  --  99* 141*  CREATININE  --  6.82*  --  6.20* 8.09*  --  5.61* 6.86*  CALCIUM  --  8.1*  --  7.3* 7.3*  --  8.0* 7.7*  MG  --  2.8*  --  2.7* 3.1*  --  2.7* 2.5*  PHOS 5.5*  --  9.0*  --   --  11.8* 9.1* 9.2*    Liver Function Tests: Recent Labs  Lab 03/17/19 0536 03/23/19 0423  AST 58* 40  ALT 50* 60*  ALKPHOS 53 97  BILITOT  0.8 0.8  PROT 7.0 6.4*  ALBUMIN 3.8 3.9   No results for input(s): LIPASE, AMYLASE in the last 168 hours. No results for input(s): AMMONIA in the last 168 hours.  CBC: Recent Labs  Lab 03/19/19 0541 03/20/19 0343 03/21/19 0349 03/22/19 0452 03/23/19 0423  WBC 21.6* 14.5* 12.6* 10.8* 14.6*  NEUTROABS 17.6* 13.4* 11.4* 9.4*  --   HGB 7.9* 6.0* 6.2* 7.8* 7.5*  HCT 23.9* 18.9* 19.0* 24.0* 22.6*  MCV 82.4 85.5 84.1 82.5 82.2  PLT 477* 333 373 453* 394    Cardiac Enzymes: Recent Labs  Lab 03/16/19 1107 03/16/19 1438 03/16/19 1634  TROPONINI 0.22* 0.33* 0.26*    BNP: Invalid input(s): POCBNP  CBG: Recent Labs  Lab 03/22/19 0527 03/22/19 1137 03/22/19 1728 03/22/19 2341 03/23/19 0539  GLUCAP 166* 145* 136* 120* 145*    Microbiology: Results for orders placed or performed during the hospital encounter of 03/08/19  SARS Coronavirus 2 (CEPHEID - Performed in [facility] hospital lab),  Hosp Order     Status: None   Collection Time: 03/08/19 11:48 PM   Specimen: Nasopharyngeal Swab  Result Value Ref Range Status   SARS Coronavirus 2 NEGATIVE NEGATIVE Final    Comment: (NOTE) If result is NEGATIVE SARS-CoV-2 target nucleic acids are NOT DETECTED. The SARS-CoV-2 RNA is generally detectable in upper and lower  respiratory specimens during the acute phase of infection. The lowest  concentration of SARS-CoV-2 viral copies this assay can detect is 250  copies / mL. A negative result does not preclude SARS-CoV-2 infection  and should not be used as the sole basis for treatment or other  patient management decisions.  A negative result may occur with  improper specimen collection / handling, submission of specimen other  than nasopharyngeal swab, presence of viral mutation(s) within the  areas targeted by this assay, and inadequate number of viral copies  (<250 copies / mL). A negative result must be combined with clinical  observations, patient history, and  epidemiological information. If result is POSITIVE SARS-CoV-2 target nucleic acids are DETECTED. The SARS-CoV-2 RNA is generally detectable in upper and lower  respiratory specimens dur ing the acute phase of infection.  Positive  results are indicative of active infection with SARS-CoV-2.  Clinical  correlation with patient history and other diagnostic information is  necessary to determine patient infection status.  Positive results do  not rule out bacterial infection or co-infection with other viruses. If result is PRESUMPTIVE POSTIVE SARS-CoV-2 nucleic acids MAY BE PRESENT.   A presumptive positive result was obtained on the submitted specimen  and confirmed on repeat testing.  While 2019 novel coronavirus  (SARS-CoV-2) nucleic acids may be present in the submitted sample  additional confirmatory testing may be necessary for epidemiological  and / or clinical management purposes  to differentiate between  SARS-CoV-2 and other Sarbecovirus currently known to infect humans.  If clinically indicated additional testing with an alternate test  methodology [phone]) is advised. The SARS-CoV-2 RNA is generally  detectable in upper and lower respiratory sp ecimens during the acute  phase of infection. The expected result is Negative. Fact Sheet for Patients:  StrictlyIdeas.no Fact Sheet for Healthcare Providers: BankingDealers.co.za This test is not yet approved or cleared by the [place] FDA and has been authorized for detection and/or diagnosis of SARS-CoV-2 by FDA under an Emergency Use Authorization (EUA).  This EUA will remain in effect (meaning this test can be used) for the duration of the COVID-19 declaration under Section 564(b)(1) of the Act, 21 U.S.C. section 360bbb-3(b)(1), unless the authorization is terminated or revoked sooner. Performed at [facility], [address] [place], [place] [address]   MRSA PCR  Screening     Status: None   Collection Time: 03/09/19  2:48 AM   Specimen: Nasal Mucosa; Nasopharyngeal  Result Value Ref Range Status   MRSA by PCR NEGATIVE NEGATIVE Final    Comment:        The GeneXpert MRSA Assay (FDA approved for NASAL specimens only), is one component of a comprehensive MRSA colonization surveillance program. It is not intended to diagnose MRSA infection nor to guide or monitor treatment for MRSA infections. Performed at [facility], [address] [place], [place] [address]   Culture, respiratory (non-expectorated)     Status: None   Collection Time: 03/09/19  8:00 AM   Specimen: Tracheal Aspirate; Respiratory  Result Value Ref Range Status   Specimen Description   Final    TRACHEAL ASPIRATE  Performed at [facility], [address] [place], [place] [address]    Special Requests   Final    NONE Performed at [facility], [place], [place] [address]    Gram Stain   Final    ABUNDANT WBC PRESENT,BOTH PMN AND MONONUCLEAR MODERATE GRAM POSITIVE COCCI RARE YEAST    Culture   Final    MODERATE GROUP B STREP(S.AGALACTIAE)ISOLATED TESTING AGAINST S. AGALACTIAE NOT ROUTINELY PERFORMED DUE TO PREDICTABILITY OF AMP/PEN/VAN SUSCEPTIBILITY. Performed at [facility], [place] [address], [place], [place] [address]    Report Status 03/11/2019 FINAL  Final  CULTURE, BLOOD (ROUTINE X 2) w Reflex to ID Panel     Status: None   Collection Time: 03/09/19  2:53 PM   Specimen: BLOOD  Result Value Ref Range Status   Specimen Description   Final    BLOOD BLOOD RIGHT ARM Performed at [facility], [address] [place], [place] [address]    Special Requests   Final    BOTTLES DRAWN AEROBIC AND ANAEROBIC Blood Culture results may not be optimal due to an excessive volume of blood received in culture bottles Performed at [facility], [address], [place], [place] [address]    Culture   Final     NO GROWTH 5 DAYS Performed at [facility], [place] [address], [place], [place] [address]    Report Status 03/17/2019 FINAL  Final  CULTURE, BLOOD (ROUTINE X 2) w Reflex to ID Panel     Status: None   Collection Time: 03/09/19  5:00 PM   Specimen: BLOOD RIGHT ARM  Result Value Ref Range Status   Specimen Description   Final    BLOOD RIGHT ARM Performed at [facility], [address], [place], [place] [address]    Special Requests   Final    BOTTLES DRAWN AEROBIC AND ANAEROBIC Blood Culture results may not be optimal due to an excessive volume of blood received in culture bottles Performed at [facility], [address] [place], [place] [address]    Culture   Final    NO GROWTH 5 DAYS Performed at [facility], [place] [address] [place], [place] [address]    Report Status 03/15/2019 FINAL  Final  Urine Culture     Status: None   Collection Time: 03/11/19  4:33 AM   Specimen: Nasal Mucosa; Urine  Result Value Ref Range Status   Specimen Description   Final    URINE, RANDOM Performed at [facility], [address] [place], [place] [address]    Special Requests   Final    NONE Performed at [facility], [address] [place], [place] [address]    Culture   Final    NO GROWTH Performed at [facility], [place] [address], [place],  [address]    Report Status 03/12/2019 FINAL  Final    Coagulation Studies: No results for input(s): LABPROT, INR in the last 72 hours.  Urinalysis: No results for input(s): COLORURINE, LABSPEC, PHURINE, GLUCOSEU, HGBUR, BILIRUBINUR, KETONESUR, PROTEINUR, UROBILINOGEN, NITRITE, LEUKOCYTESUR in the last 72 hours.  Invalid input(s): APPERANCEUR    Imaging: Dg Chest Port 1 View  Result Date: 03/22/2019 CLINICAL DATA:  Acute respiratory failure EXAM: PORTABLE CHEST 1 VIEW COMPARISON:  Radiograph 03/22/2019 FINDINGS: Interval extubation. NG tube remains in the stomach. Central  venous lines in the mid SVC unchanged. Normal cardiac silhouette. Diffuse bilateral  nodular airspace disease is not improved. Increased density in the RIGHT lower lobe. No pneumothorax IMPRESSION: Is 1. No improvement in diffuse bilateral nodular airspace disease. Some increased focality in the RIGHT lower lobe. 2. Extubation. 3. Additional support apparatus stable. Electronically Signed   By: Suzy Bouchard M.D.   On: 03/22/2019 20:01   Dg Chest Port 1 View  Result Date: 03/22/2019 CLINICAL DATA:  Acute respiratory failure. Endotracheally intubated. EXAM: PORTABLE CHEST 1 VIEW COMPARISON:  03/20/2019 FINDINGS: Support lines and tubes in appropriate position. Low lung volumes are again seen. Moderate diffuse pulmonary airspace disease shows no significant change. Heart size is stable. IMPRESSION: Moderate diffuse pulmonary airspace disease, without significant change. Electronically Signed   By: Earle Gell M.D.   On: 03/22/2019 07:34     Medications:   . sodium chloride Stopped (03/18/19 1430)  . albumin human Stopped (03/22/19 2248)  . feeding supplement (VITAL HIGH PROTEIN) Stopped (03/22/19 0930)  . norepinephrine (LEVOPHED) Adult infusion Stopped (03/21/19 1200)  . piperacillin-tazobactam (ZOSYN)  IV Stopped (03/23/19 0206)   . aspirin  325 mg Per Tube Daily  . B-complex with vitamin C  1 tablet Per Tube Daily  . budesonide (PULMICORT) nebulizer solution  0.5 mg Nebulization BID  . Chlorhexidine Gluconate Cloth  6 each Topical Q0600  . feeding supplement (PRO-STAT SUGAR FREE 64)  60 mL Per Tube TID  . heparin injection (subcutaneous)  5,000 Units Subcutaneous Q8H  . insulin aspart  0-9 Units Subcutaneous Q6H  . ipratropium-albuterol  3 mL Nebulization Q4H  . methylPREDNISolone (SOLU-MEDROL) injection  40 mg Intravenous BID  . mirtazapine  15 mg Oral QHS  . multivitamin  15 mL Per Tube Daily  . pantoprazole sodium  40 mg Per Tube QHS  . polyethylene glycol  17 g Per Tube Daily  .  sodium chloride flush  10-40 mL Intracatheter Q12H   sodium chloride, bisacodyl, haloperidol lactate, ipratropium-albuterol, LORazepam, nitroGLYCERIN, sodium chloride flush  Assessment/ Plan:  Mr. Glenn Rasmussen is a 28 y.o. white male with hypertension, irritable bowel syndrome, depression with overdose. Now with acute renal failure requiring hemodialysis.   1. Acute renal failure: last hemodialysis treatment was 6/13. UF of 2.3 liters. Nonoliguric urine output. Required vasopressors. Creatinine on admission 1.26, normal GFR Will need hemodialysis today for uremia, volume overload/pulmonary edema. Orders prepared.   2. Anemia with renal failure: hemoglobin 7.5.  - EPO with HD treatment.   3. Acute respiratory failure with pneumonia: requiring noninvasive ventilation. Concern that patient may need endotracheal intubation.  Appreciate critical care input.  - pip/tazo, solumedrol.     LOS: [place] 6/15/20209:06 AM

## 2019-03-23 NOTE — Progress Notes (Signed)
Pt failed bedside swallow evaluation.  Pt had difficulty swallowing a sip of water and then subsequently coughed repeated.  Jeraldine Loots, NP.   Pt NPO with speech therapy consult.

## 2019-03-24 LAB — BASIC METABOLIC PANEL
Anion gap: 18 — ABNORMAL HIGH (ref 5–15)
BUN: 99 mg/dL — ABNORMAL HIGH (ref 6–20)
CO2: 25 mmol/L (ref 22–32)
Calcium: 8.3 mg/dL — ABNORMAL LOW (ref 8.9–10.3)
Chloride: 102 mmol/L (ref 98–111)
Creatinine, Ser: 5.04 mg/dL — ABNORMAL HIGH (ref 0.61–1.24)
GFR calc Af Amer: 16 mL/min — ABNORMAL LOW (ref >60)
GFR calc non Af Amer: 14 mL/min — ABNORMAL LOW (ref >60)
Glucose, Bld: 124 mg/dL — ABNORMAL HIGH (ref 70–99)
Potassium: 4.9 mmol/L (ref 3.5–5.1)
Sodium: 145 mmol/L (ref 135–145)

## 2019-03-24 LAB — TROPONIN I: Troponin I: 0.06 ng/mL (ref <0.03)

## 2019-03-24 LAB — GLUCOSE, CAPILLARY
Glucose-Capillary: 105 mg/dL — ABNORMAL HIGH (ref 70–99)
Glucose-Capillary: 108 mg/dL — ABNORMAL HIGH (ref 70–99)
Glucose-Capillary: 110 mg/dL — ABNORMAL HIGH (ref 70–99)
Glucose-Capillary: 115 mg/dL — ABNORMAL HIGH (ref 70–99)
Glucose-Capillary: 117 mg/dL — ABNORMAL HIGH (ref 70–99)

## 2019-03-24 LAB — MAGNESIUM: Magnesium: 2.5 mg/dL — ABNORMAL HIGH (ref 1.7–2.4)

## 2019-03-24 MED ORDER — FUROSEMIDE 10 MG/ML IJ SOLN
80.0000 mg | Freq: Once | INTRAMUSCULAR | Status: AC
  Administered 2019-03-24: 80 mg via INTRAVENOUS
  Filled 2019-03-24: qty 8

## 2019-03-24 MED ORDER — FUROSEMIDE 10 MG/ML IJ SOLN: 80.0000 mg | Freq: Once | INTRAMUSCULAR | Status: DC

## 2019-03-24 MED ORDER — B COMPLEX-C PO TABS
1.0000 | ORAL_TABLET | Freq: Every day | ORAL | Status: DC
  Filled 2019-03-24 (×2): qty 1

## 2019-03-24 MED ORDER — ASPIRIN EC 325 MG PO TBEC
325.0000 mg | DELAYED_RELEASE_TABLET | Freq: Every day | ORAL | Status: DC
  Administered 2019-03-25 – 2019-03-30 (×6): 325 mg via ORAL
  Filled 2019-03-24 (×6): qty 1

## 2019-03-24 MED ORDER — ADULT MULTIVITAMIN W/MINERALS CH
1.0000 | ORAL_TABLET | Freq: Every day | ORAL | Status: DC
  Administered 2019-03-25: 1 via ORAL
  Filled 2019-03-24: qty 1

## 2019-03-24 MED ORDER — PANTOPRAZOLE SODIUM 40 MG PO TBEC
40.0000 mg | DELAYED_RELEASE_TABLET | Freq: Every day | ORAL | Status: DC
  Administered 2019-03-25 – 2019-03-29 (×5): 40 mg via ORAL
  Filled 2019-03-24 (×5): qty 1

## 2019-03-24 NOTE — Progress Notes (Signed)
[facility]  ROUNDING NOTE   Subjective:   Sitter at bedside.   Hemodialysis yesterday. Tolerated treatment well. UF of 3.5 liter. UOP 2167mL.   On BIPAP  Objective:  Vital signs in last 24 hours:  Temp:  [98.3 F (36.8 C)-99.4 F (37.4 C)] 98.3 F (36.8 C) (06/16 0800) Pulse Rate:  [62-95] 69 (06/16 1000) Resp:  [13-31] 19 (06/16 1000) BP: (111-135)/(67-87) 130/80 (06/16 1000) SpO2:  [89 %-100 %] 97 % (06/16 1000) FiO2 (%):  [50 %-70 %] 50 % (06/16 0811) Weight:  [87.4 kg-93.1 kg] 87.4 kg (06/16 0421)  Weight change:  Filed Weights   03/23/19 1000 03/23/19 1335 03/24/19 0421  Weight: 96.3 kg 93.1 kg 87.4 kg    Intake/Output: I/O last 3 completed shifts: In: 208.1 [I.V.:10; IV Piggyback:198.1] Out: 1017 [Urine:4100; Other:3500]   Intake/Output this shift:  Total I/O In: 70 [NG/GT:70] Out: 420 [Urine:420]  Physical Exam: General: Critically ill  Head: BIPAP   Eyes: Anicteric, PERRL  Neck: LIJ HD catheter, RIJ tlc  Lungs:  Bilateral wheezing  Heart: tachycardia  Abdomen:  +distended, + hypoactive bowel sounds.   Extremities:  no peripheral edema.  Neurologic: Nonfocal, moving all four extremities  Skin: No lesions  Access: LIJ temp HD catheter 6/1     Basic Metabolic Panel: Recent Labs  Lab 03/18/19 1209  03/19/19 1930 03/20/19 0343 03/21/19 0349 03/21/19 1100 03/22/19 0452 03/23/19 0423 03/24/19 0446  NA  --    < >  --  140 140  --  141 143 145  K  --    < >  --  4.5 4.8  --  5.1 4.9 4.9  CL  --    < >  --  101 100  --  95* 99 102  CO2  --    < >  --  25 21*  --  26 24 25   GLUCOSE  --    < >  --  167* 187*  --  172* 144* 124*  BUN  --    < >  --  78* 113*  --  99* 141* 99*  CREATININE  --    < >  --  6.20* 8.09*  --  5.61* 6.86* 5.04*  CALCIUM  --    < >  --  7.3* 7.3*  --  8.0* 7.7* 8.3*  MG  --    < >  --  2.7* 3.1*  --  2.7* 2.5* 2.5*  PHOS 5.5*  --  9.0*  --   --  11.8* 9.1* 9.2*  --    < > = values in this interval not  displayed.    Liver Function Tests: Recent Labs  Lab 03/23/19 0423  AST 40  ALT 60*  ALKPHOS 97  BILITOT 0.8  PROT 6.4*  ALBUMIN 3.9   No results for input(s): LIPASE, AMYLASE in the last 168 hours. No results for input(s): AMMONIA in the last 168 hours.  CBC: Recent Labs  Lab 03/19/19 0541 03/20/19 0343 03/21/19 0349 03/22/19 0452 03/23/19 0423  WBC 21.6* 14.5* 12.6* 10.8* 14.6*  NEUTROABS 17.6* 13.4* 11.4* 9.4*  --   HGB 7.9* 6.0* 6.2* 7.8* 7.5*  HCT 23.9* 18.9* 19.0* 24.0* 22.6*  MCV 82.4 85.5 84.1 82.5 82.2  PLT 477* 333 373 453* 394    Cardiac Enzymes: No results for input(s): CKTOTAL, CKMB, CKMBINDEX, TROPONINI in the last 168 hours.  BNP: Invalid input(s): POCBNP  CBG: Recent Labs  Lab 03/23/19  1238 03/23/19 1709 03/23/19 2319 03/24/19 0548 03/24/19 0727  GLUCAP 108* 123* 103* 105* 117*    Microbiology: Results for orders placed or performed during the hospital encounter of 03/08/19  SARS Coronavirus 2 (CEPHEID - Performed in [place] hospital lab), Hosp Order     Status: None   Collection Time: 03/08/19 11:48 PM   Specimen: Nasopharyngeal Swab  Result Value Ref Range Status   SARS Coronavirus 2 NEGATIVE NEGATIVE Final    Comment: (NOTE) If result is NEGATIVE SARS-CoV-2 target nucleic acids are NOT DETECTED. The SARS-CoV-2 RNA is generally detectable in upper and lower  respiratory specimens during the acute phase of infection. The lowest  concentration of SARS-CoV-2 viral copies this assay can detect is 250  copies / mL. A negative result does not preclude SARS-CoV-2 infection  and should not be used as the sole basis for treatment or other  patient management decisions.  A negative result may occur with  improper specimen collection / handling, submission of specimen other  than nasopharyngeal swab, presence of viral mutation(s) within the  areas targeted by this assay, and inadequate number of viral copies  (<250 copies / mL). A  negative result must be combined with clinical  observations, patient history, and epidemiological information. If result is POSITIVE SARS-CoV-2 target nucleic acids are DETECTED. The SARS-CoV-2 RNA is generally detectable in upper and lower  respiratory specimens dur ing the acute phase of infection.  Positive  results are indicative of active infection with SARS-CoV-2.  Clinical  correlation with patient history and other diagnostic information is  necessary to determine patient infection status.  Positive results do  not rule out bacterial infection or co-infection with other viruses. If result is PRESUMPTIVE POSTIVE SARS-CoV-2 nucleic acids MAY BE PRESENT.   A presumptive positive result was obtained on the submitted specimen  and confirmed on repeat testing.  While 2019 novel coronavirus  (SARS-CoV-2) nucleic acids may be present in the submitted sample  additional confirmatory testing may be necessary for epidemiological  and / or clinical management purposes  to differentiate between  SARS-CoV-2 and other Sarbecovirus currently known to infect humans.  If clinically indicated additional testing with an alternate test  methodology [phone]) is advised. The SARS-CoV-2 RNA is generally  detectable in upper and lower respiratory sp ecimens during the acute  phase of infection. The expected result is Negative. Fact Sheet for Patients:  StrictlyIdeas.no Fact Sheet for Healthcare Providers: BankingDealers.co.za This test is not yet approved or cleared by the [place] FDA and has been authorized for detection and/or diagnosis of SARS-CoV-2 by FDA under an Emergency Use Authorization (EUA).  This EUA will remain in effect (meaning this test can be used) for the duration of the COVID-19 declaration under Section 564(b)(1) of the Act, 21 U.S.C. section 360bbb-3(b)(1), unless the authorization is terminated or revoked sooner. Performed  at [facility], [address] [place], [place] [address]   MRSA PCR Screening     Status: None   Collection Time: 03/09/19  2:48 AM   Specimen: Nasal Mucosa; Nasopharyngeal  Result Value Ref Range Status   MRSA by PCR NEGATIVE NEGATIVE Final    Comment:        The GeneXpert MRSA Assay (FDA approved for NASAL specimens only), is one component of a comprehensive MRSA colonization surveillance program. It is not intended to diagnose MRSA infection nor to guide or monitor treatment for MRSA infections. Performed at [facility], [place], [place]  [address]   Culture, respiratory (non-expectorated)     Status: None   Collection Time: 03/09/19  8:00 AM   Specimen: Tracheal Aspirate; Respiratory  Result Value Ref Range Status   Specimen Description   Final    TRACHEAL ASPIRATE Performed at [facility], [address] [place], [place] [address]    Special Requests   Final    NONE Performed at [facility], [address] [place], [place] [address]    Gram Stain   Final    ABUNDANT WBC PRESENT,BOTH PMN AND MONONUCLEAR MODERATE GRAM POSITIVE COCCI RARE YEAST    Culture   Final    MODERATE GROUP B STREP(S.AGALACTIAE)ISOLATED TESTING AGAINST S. AGALACTIAE NOT ROUTINELY PERFORMED DUE TO PREDICTABILITY OF AMP/PEN/VAN SUSCEPTIBILITY. Performed at [facility], [place] [address], [place], [place] [address]    Report Status 03/11/2019 FINAL  Final  CULTURE, BLOOD (ROUTINE X 2) w Reflex to ID Panel     Status: None   Collection Time: 03/09/19  2:53 PM   Specimen: BLOOD  Result Value Ref Range Status   Specimen Description   Final    BLOOD BLOOD RIGHT ARM Performed at [facility], [address] [place], [place] [address]    Special Requests   Final    BOTTLES DRAWN AEROBIC AND ANAEROBIC Blood Culture results may not be optimal due to an excessive volume of blood received in culture bottles Performed at  [facility], [address] [place], [place] [address]    Culture   Final    NO GROWTH 5 DAYS Performed at [facility], [place] [address] [place], [place] [address]    Report Status 03/17/2019 FINAL  Final  CULTURE, BLOOD (ROUTINE X 2) w Reflex to ID Panel     Status: None   Collection Time: 03/09/19  5:00 PM   Specimen: BLOOD RIGHT ARM  Result Value Ref Range Status   Specimen Description   Final    BLOOD RIGHT ARM Performed at [facility], [address] [place], [place] [address]    Special Requests   Final    BOTTLES DRAWN AEROBIC AND ANAEROBIC Blood Culture results may not be optimal due to an excessive volume of blood received in culture bottles Performed at [facility], [address], [place], [place] [address]    Culture   Final    NO GROWTH 5 DAYS Performed at [facility], [place] [address], [place], [place] [address]    Report Status 03/15/2019 FINAL  Final  Urine Culture     Status: None   Collection Time: 03/11/19  4:33 AM   Specimen: Nasal Mucosa; Urine  Result Value Ref Range Status   Specimen Description   Final    URINE, RANDOM Performed at [facility], [address], [place], [place] [address]    Special Requests   Final    NONE Performed at [facility], [address], [place], [place] [address]    Culture   Final    NO GROWTH Performed at [facility], [place] [address], [place], [place] [address]    Report Status 03/12/2019 FINAL  Final    Coagulation Studies: No results for input(s): LABPROT, INR in the last 72 hours.  Urinalysis: No results for input(s): COLORURINE, LABSPEC, PHURINE, GLUCOSEU, HGBUR, BILIRUBINUR, KETONESUR, PROTEINUR, UROBILINOGEN, NITRITE, LEUKOCYTESUR in the last 72 hours.  Invalid input(s): APPERANCEUR    Imaging: Dg Chest [facility]  1 View  Result Date: 03/22/2019 CLINICAL DATA:  Acute respiratory failure EXAM: PORTABLE CHEST 1 VIEW COMPARISON:  Radiograph  03/22/2019 FINDINGS: Interval extubation. NG tube remains in the stomach. Central venous lines in the mid SVC unchanged. Normal cardiac silhouette. Diffuse bilateral nodular airspace disease is not improved. Increased density in the RIGHT lower lobe. No pneumothorax IMPRESSION: Is 1. No improvement in diffuse bilateral nodular airspace disease. Some increased focality in the RIGHT lower lobe. 2. Extubation. 3. Additional support apparatus stable. Electronically Signed   By: Suzy Bouchard M.D.   On: 03/22/2019 20:01     Medications:   . sodium chloride Stopped (03/18/19 1430)  . piperacillin-tazobactam (ZOSYN)  IV 3.375 g (03/24/19 0914)   . aspirin EC  325 mg Oral Daily  . B-complex with vitamin C  1 tablet Oral Daily  . budesonide (PULMICORT) nebulizer solution  0.5 mg Nebulization BID  . Chlorhexidine Gluconate Cloth  6 each Topical Q0600  . furosemide  80 mg Intravenous Once  . heparin injection (subcutaneous)  5,000 Units Subcutaneous Q8H  . insulin aspart  0-9 Units Subcutaneous Q6H  . ipratropium-albuterol  3 mL Nebulization Q4H  . methylPREDNISolone (SOLU-MEDROL) injection  40 mg Intravenous BID  . mirtazapine  15 mg Oral QHS  . multivitamin with minerals  1 tablet Oral Daily  . pantoprazole  40 mg Oral QHS  . sodium chloride flush  10-40 mL Intracatheter Q12H   sodium chloride, bisacodyl, haloperidol lactate, ipratropium-albuterol, LORazepam, nitroGLYCERIN, polyethylene glycol, sodium chloride flush  Assessment/ Plan:  Mr. Glenn Rasmussen is a 28 y.o. white male with hypertension, irritable bowel syndrome, depression with overdose. Now with acute renal failure requiring hemodialysis.   1. Acute renal failure:hemodialysis treatment yesterday. Tolerated treatment well. UF of 3.5 liters Nonoliguric urine output. Required vasopressors. Creatinine on admission 1.26, normal GFR Tentatively will hold dialysis this morning and evaluate tomorrow for dialysis need.  Nonoliguric  urine output.  - IV furosemide bolus  2. Anemia with renal failure: hemoglobin 7.5.  - EPO with HD treatment MWF.   3. Acute respiratory failure with pneumonia: requiring noninvasive ventilation. Appreciate critical care input.  - pip/tazo, solumedrol.     LOS: Glenn Rasmussen 6/16/202010:29 AM

## 2019-03-24 NOTE — Progress Notes (Addendum)
Follow up - Critical Care Medicine Note  Patient Details:    Glenn Rasmussen is an 28 y.o. male.admitted with overdose on benzodiazepine, ARB, calcium channel blocker, and Phenergan.  Required intubation and hemodialysis for respiratory failure and renal failure.  Subsequently transferred out of the ICU.    Readmitted to ICU  due to respiratory distress.  Lines, Airways, Drains: CVC Triple Lumen 03/09/19 Right Femoral (Active)  Indication for Insertion or Continuance of Line Prolonged intravenous therapies 03/17/2019 12:00 PM  Site Assessment Clean;Dry;Intact 03/17/2019 12:00 PM  Proximal Lumen Status Flushed;Saline locked;Blood return noted 03/17/2019 12:00 PM  Medial Lumen Status Flushed;Saline locked;Blood return noted 03/17/2019 12:00 PM  Distal Lumen Status Flushed;Saline locked;Blood return noted 03/17/2019 12:00 PM  Dressing Type Transparent;Occlusive 03/17/2019 12:00 PM  Dressing Status Clean;Dry;Intact 03/17/2019 12:00 PM  Line Care Connections checked and tightened 03/17/2019 12:00 PM  Dressing Intervention Other (Comment) 03/17/2019 12:00 PM  Dressing Change Due 03/20/19 03/17/2019 12:00 PM     NG/OG Tube Nasogastric Left nare Documented cm marking at nare/ corner of mouth 75 cm (Active)  Cm Marking at Nare/Corner of Mouth (if applicable) 75 cm 0/12/5007  4:00 PM  Site Assessment Clean;Dry;Intact 03/17/2019  4:00 PM  Ongoing Placement Verification No change in cm markings or external length of tube from initial placement;No change in respiratory status;No acute changes, not attributed to clinical condition 03/17/2019  4:00 PM  Status Suction-low intermittent 03/17/2019  4:00 PM  Drainage Appearance Brown 03/17/2019  4:00 PM  Output (mL) 50 mL 03/17/2019  4:00 PM     Urethral Catheter Wells Guiles RN Non-latex 14 Fr. (Active)  Indication for Insertion or Continuance of Catheter Therapy based on hourly urine output monitoring and documentation for critical condition (NOT STRICT I&O) 03/17/2019  4:00 PM  Site  Assessment Clean;Intact;Dry 03/17/2019  4:00 PM  Catheter Maintenance Bag below level of bladder;Catheter secured;Drainage bag/tubing not touching floor;No dependent loops;Insertion date on drainage bag;Seal intact 03/17/2019  4:00 PM  Collection Container Standard drainage bag 03/17/2019  4:00 PM  Securement Method Leg strap 03/17/2019  4:00 PM  Urinary Catheter Interventions Unclamped 03/17/2019  4:00 PM  Output (mL) 100 mL 03/17/2019  4:00 PM    Anti-infectives:  Anti-infectives (From admission, onward)   Start     Dose/Rate Route Frequency Ordered Stop   03/19/19 1400  piperacillin-tazobactam (ZOSYN) IVPB 3.375 g     3.375 g 12.5 mL/hr over 240 Minutes Intravenous Every 12 hours 03/19/19 1353 03/24/19 2359   03/19/19 0830  erythromycin 500 mg in sodium chloride 0.9 % 100 mL IVPB     500 mg 100 mL/hr over 60 Minutes Intravenous Every 8 hours 03/19/19 0824 03/22/19 0259   03/18/19 0200  Ampicillin-Sulbactam (UNASYN) 3 g in sodium chloride 0.9 % 100 mL IVPB  Status:  Discontinued     3 g 200 mL/hr over 30 Minutes Intravenous Every 12 hours 03/18/19 0044 03/19/19 1353   03/17/19 2100  metroNIDAZOLE (FLAGYL) IVPB 500 mg  Status:  Discontinued     500 mg 100 mL/hr over 60 Minutes Intravenous Every 8 hours 03/17/19 2048 03/18/19 0031   03/11/19 1800  cefTRIAXone (ROCEPHIN) 2 g in sodium chloride 0.9 % 100 mL IVPB  Status:  Discontinued     2 g 200 mL/hr over 30 Minutes Intravenous Every 24 hours 03/11/19 1243 03/18/19 0048   03/11/19 1200  vancomycin (VANCOCIN) 1,500 mg in sodium chloride 0.9 % 500 mL IVPB  Status:  Discontinued     1,500 mg 250  mL/hr over 120 Minutes Intravenous  Once 03/11/19 1020 03/11/19 1032   03/11/19 1200  vancomycin (VANCOCIN) 1,500 mg in sodium chloride 0.9 % 500 mL IVPB     1,500 mg 250 mL/hr over 120 Minutes Intravenous  Once 03/11/19 1030 03/11/19 1333   03/10/19 1200  vancomycin (VANCOCIN) 1,500 mg in sodium chloride 0.9 % 500 mL IVPB  Status:  Discontinued     1,500  mg 250 mL/hr over 120 Minutes Intravenous  Once 03/10/19 1055 03/10/19 1114   03/10/19 1200  vancomycin (VANCOCIN) 1,500 mg in sodium chloride 0.9 % 500 mL IVPB     1,500 mg 250 mL/hr over 120 Minutes Intravenous  Once 03/10/19 1114 03/10/19 1345   03/09/19 0515  piperacillin-tazobactam (ZOSYN) IVPB 3.375 g  Status:  Discontinued     3.375 g 12.5 mL/hr over 240 Minutes Intravenous Every 8 hours 03/09/19 0507 03/11/19 1243      Microbiology: Results for orders placed or performed during the hospital encounter of 03/08/19  SARS Coronavirus 2 (CEPHEID - Performed in [place] hospital lab), Hosp Order     Status: None   Collection Time: 03/08/19 11:48 PM   Specimen: Nasopharyngeal Swab  Result Value Ref Range Status   SARS Coronavirus 2 NEGATIVE NEGATIVE Final    Comment: (NOTE) If result is NEGATIVE SARS-CoV-2 target nucleic acids are NOT DETECTED. The SARS-CoV-2 RNA is generally detectable in upper and lower  respiratory specimens during the acute phase of infection. The lowest  concentration of SARS-CoV-2 viral copies this assay can detect is 250  copies / mL. A negative result does not preclude SARS-CoV-2 infection  and should not be used as the sole basis for treatment or other  patient management decisions.  A negative result may occur with  improper specimen collection / handling, submission of specimen other  than nasopharyngeal swab, presence of viral mutation(s) within the  areas targeted by this assay, and inadequate number of viral copies  (<250 copies / mL). A negative result must be combined with clinical  observations, patient history, and epidemiological information. If result is POSITIVE SARS-CoV-2 target nucleic acids are DETECTED. The SARS-CoV-2 RNA is generally detectable in upper and lower  respiratory specimens dur ing the acute phase of infection.  Positive  results are indicative of active infection with SARS-CoV-2.  Clinical  correlation with patient  history and other diagnostic information is  necessary to determine patient infection status.  Positive results do  not rule out bacterial infection or co-infection with other viruses. If result is PRESUMPTIVE POSTIVE SARS-CoV-2 nucleic acids MAY BE PRESENT.   A presumptive positive result was obtained on the submitted specimen  and confirmed on repeat testing.  While 2019 novel coronavirus  (SARS-CoV-2) nucleic acids may be present in the submitted sample  additional confirmatory testing may be necessary for epidemiological  and / or clinical management purposes  to differentiate between  SARS-CoV-2 and other Sarbecovirus currently known to infect humans.  If clinically indicated additional testing with an alternate test  methodology [phone]) is advised. The SARS-CoV-2 RNA is generally  detectable in upper and lower respiratory sp ecimens during the acute  phase of infection. The expected result is Negative. Fact Sheet for Patients:  StrictlyIdeas.no Fact Sheet for Healthcare Providers: BankingDealers.co.za This test is not yet approved or cleared by the [place] FDA and has been authorized for detection and/or diagnosis of SARS-CoV-2 by FDA under an Emergency Use Authorization (EUA).  This EUA will remain in effect (meaning this  test can be used) for the duration of the COVID-19 declaration under Section 564(b)(1) of the Act, 21 U.S.C. section 360bbb-3(b)(1), unless the authorization is terminated or revoked sooner. Performed at [facility], [address] [place], [place] [address]   MRSA PCR Screening     Status: None   Collection Time: 03/09/19  2:48 AM   Specimen: Nasal Mucosa; Nasopharyngeal  Result Value Ref Range Status   MRSA by PCR NEGATIVE NEGATIVE Final    Comment:        The GeneXpert MRSA Assay (FDA approved for NASAL specimens only), is one component of a comprehensive MRSA  colonization surveillance program. It is not intended to diagnose MRSA infection nor to guide or monitor treatment for MRSA infections. Performed at [facility], [address] [place], [place] [address]   Culture, respiratory (non-expectorated)     Status: None   Collection Time: 03/09/19  8:00 AM   Specimen: Tracheal Aspirate; Respiratory  Result Value Ref Range Status   Specimen Description   Final    TRACHEAL ASPIRATE Performed at [facility], [address] [place], [place] [address]    Special Requests   Final    NONE Performed at [facility], [address] [place], [place] [address]    Gram Stain   Final    ABUNDANT WBC PRESENT,BOTH PMN AND MONONUCLEAR MODERATE GRAM POSITIVE COCCI RARE YEAST    Culture   Final    MODERATE GROUP B STREP(S.AGALACTIAE)ISOLATED TESTING AGAINST S. AGALACTIAE NOT ROUTINELY PERFORMED DUE TO PREDICTABILITY OF AMP/PEN/VAN SUSCEPTIBILITY. Performed at [facility], [place] [address], [place], [place] [address]    Report Status 03/11/2019 FINAL  Final  CULTURE, BLOOD (ROUTINE X 2) w Reflex to ID Panel     Status: None   Collection Time: 03/09/19  2:53 PM   Specimen: BLOOD  Result Value Ref Range Status   Specimen Description   Final    BLOOD BLOOD RIGHT ARM Performed at [facility], [address] [place], [place] [address]    Special Requests   Final    BOTTLES DRAWN AEROBIC AND ANAEROBIC Blood Culture results may not be optimal due to an excessive volume of blood received in culture bottles Performed at [facility], [address], [place], [place] [address]    Culture   Final    NO GROWTH 5 DAYS Performed at [facility], [place] [address], [place], [place] [address]    Report Status 03/17/2019 FINAL  Final  CULTURE, BLOOD (ROUTINE X 2) w Reflex to ID Panel     Status: None   Collection Time: 03/09/19  5:00 PM   Specimen: BLOOD RIGHT ARM  Result Value Ref Range Status    Specimen Description   Final    BLOOD RIGHT ARM Performed at [facility], [address], [place], [place] [address]    Special Requests   Final    BOTTLES DRAWN AEROBIC AND ANAEROBIC Blood Culture results may not be optimal due to an excessive volume of blood received in culture bottles Performed at [facility], [address], [place], [place] [address]    Culture   Final    NO GROWTH 5 DAYS Performed at [facility], [place] [address] [place], [place] [address]    Report Status 03/15/2019 FINAL  Final  Urine Culture     Status: None   Collection Time: 03/11/19  4:33 AM   Specimen:  Nasal Mucosa; Urine  Result Value Ref Range Status   Specimen Description   Final    URINE, RANDOM Performed at [facility], [address], [place], [place] [address]    Special Requests   Final    NONE Performed at [facility], [address], [place], [place] [address]    Culture   Final    NO GROWTH Performed at [facility], [place] [address] [place], [place] [address]    Report Status 03/12/2019 FINAL  Final    Best Practice/Protocols:  VTE Prophylaxis: Heparin (SQ)   Events: 5/31 admitted for severe shock and resp failure DRUG OD 6/1 started CRRT multiorgan failure, resp failure, vasopressors 6/2 severe hypoxia UF rates increased to 500 6/3 DNR, multiorgan failure 6/4 CODE STATUS changed to full code,high risk for reintubation severe hypoxia on CRRT 6/5 high flow [place], on CRRT, high risk for intubation 6/7 resp failure resolved, off CRRT, transfer to gen med floor 6/9 readmitted to ICU due to respiratory distress, abdominal distention due to ileus. 6/11 reintubated due to acute pulmonary edema from volume overload. 6/12 new right IJ placed right femoral CVL removed 6/14 extubated 6/15 right IJ CVL removed, PIV in place  6/15 emergent hemodialysis due to volume overload   Studies: Ct Abdomen Pelvis Wo Contrast  Result Date:  03/17/2019 CLINICAL DATA:  Overdose, abdominal distension EXAM: CT ABDOMEN AND PELVIS WITHOUT CONTRAST TECHNIQUE: Multidetector CT imaging of the abdomen and pelvis was performed following the standard protocol without IV contrast. COMPARISON:  04/27/2007 FINDINGS: Lower chest: Small bilateral pleural effusions and associated atelectasis or consolidation. Hepatobiliary: No focal liver abnormality is seen. Status post cholecystectomy. No biliary dilatation. Pancreas: Unremarkable. No pancreatic ductal dilatation or surrounding inflammatory changes. Spleen: Normal in size without significant abnormality. Adrenals/Urinary Tract: Adrenal glands are unremarkable. Kidneys are normal, without renal calculi, solid lesion, or hydronephrosis. Foley catheter in the bladder Stomach/Bowel: Appendix is surgically absent. The stomach, small bowel, and colon are diffusely distended by air and fluid, with fluid present to the rectum. Vascular/Lymphatic: Right femoral central venous catheter. No enlarged abdominal or pelvic lymph nodes. Reproductive: No mass or other significant abnormality. Other: No abdominal wall hernia or abnormality. Small volume ascites. Anasarca. Musculoskeletal: No acute or significant osseous findings. IMPRESSION: 1. The stomach, small bowel, and colon are diffusely distended by air and fluid, with fluid present to the rectum. Findings are consistent with ileus. 2.  Pleural effusions, ascites, and anasarca. 3.  Status post cholecystectomy and appendectomy. Electronically Signed   By: Eddie Candle M.D.   On: 03/17/2019 08:35   Dg Abd 1 View  Result Date: 03/21/2019 CLINICAL DATA:  Ileus follow-up. EXAM: ABDOMEN - 1 VIEW COMPARISON:  March 19, 2019 FINDINGS: Air-filled dilated loops of primarily: Remain, similar to mildly improved in the interval. No other interval changes. An NG tube terminates in the left upper quadrant. IMPRESSION: 1. Mild improvement in ileus. 2. The NG tube terminates in the left upper  quadrant. Electronically Signed   By: Dorise Bullion III M.D   On: 03/21/2019 08:14   Dg Abd 1 View  Result Date: 03/19/2019 CLINICAL DATA:  Follow-up ileus EXAM: ABDOMEN - 1 VIEW COMPARISON:  03/18/2019 FINDINGS: Gastric catheter is again noted within the stomach. Right femoral central line is noted. Diffuse gaseous distension of the colon is again seen with improved appearance of the small bowel with only a few mildly prominent loops identified. No free air is seen. No bony abnormality is  noted. IMPRESSION: Improving ileus with only colonic distension on the current exam. Electronically Signed   By: Inez Catalina M.D.   On: 03/19/2019 08:06   Dg Abd 1 View  Result Date: 03/18/2019 CLINICAL DATA:  Follow-up ileus EXAM: ABDOMEN - 1 VIEW COMPARISON:  Film from the previous day. FINDINGS: Gastric catheter is noted coiled within the stomach. There remains scattered large and small bowel gas. Some progression and small-bowel dilatation is noted when compare with the previous day. Right femoral central line is seen. No bony abnormality is noted. IMPRESSION: Gaseous distension of the large and small bowel with some small bowel dilatation. This would be consistent with a generalized ileus. Correlation with the physical exam is recommended. Electronically Signed   By: Inez Catalina M.D.   On: 03/18/2019 07:31   Dg Abd 1 View  Result Date: 03/17/2019 CLINICAL DATA:  Check gastric catheter placement EXAM: ABDOMEN - 1 VIEW COMPARISON:  None. FINDINGS: Gastric catheter is noted coiled within the stomach. Scattered large and small bowel gas is seen similar to that seen on prior CT examination. No free air is noted. IMPRESSION: Gastric catheter coiled within the stomach. Electronically Signed   By: Inez Catalina M.D.   On: 03/17/2019 10:09   Dg Abd 1 View  Result Date: 03/14/2019 CLINICAL DATA:  28 year old male with history of abdominal pain. EXAM: ABDOMEN - 1 VIEW COMPARISON:  03/09/2019. FINDINGS: Right femoral  central venous catheter with tip projecting over the expected location of the proximal right common femoral vein. Gas and stool are seen scattered throughout the colon extending to the level of the distal rectum. No pathologic distension of small bowel is noted. Several nondilated gas-filled loops of small bowel or noted projecting over the central abdomen. No gross evidence of pneumoperitoneum. IMPRESSION: 1. Nonspecific, nonobstructive bowel gas pattern, as above. 2. No pneumoperitoneum. Electronically Signed   By: Vinnie Langton M.D.   On: 03/14/2019 22:34   Dg Abd 1 View  Result Date: 03/09/2019 CLINICAL DATA:  28 year old male presenting after drug overdose. EXAM: ABDOMEN - 1 VIEW COMPARISON:  Portable chest today. CT Abdomen and Pelvis 05/02/2007. FINDINGS: Semi upright AP view at 0510 hours. Enteric tube side hole is at the level of the gastric body. Nonobstructed bowel-gas pattern. No osseous abnormality identified. IMPRESSION: Enteric tube side hole at the level of the gastric body. Normal visible bowel gas pattern. Electronically Signed   By: Genevie Ann M.D.   On: 03/09/2019 05:46   Ct Head Wo Contrast  Result Date: 03/13/2019 CLINICAL DATA:  Drug overdose EXAM: CT HEAD WITHOUT CONTRAST TECHNIQUE: Contiguous axial images were obtained from the base of the skull through the vertex without intravenous contrast. COMPARISON:  Head CT 12/29/2006 FINDINGS: Brain: There is no mass, hemorrhage or extra-axial collection. The size and configuration of the ventricles and extra-axial CSF spaces are normal. The brain parenchyma is normal, without acute or chronic infarction. Vascular: No abnormal hyperdensity of the major intracranial arteries or dural venous sinuses. No intracranial atherosclerosis. Skull: The visualized skull base, calvarium and extracranial soft tissues are normal. Sinuses/Orbits: No fluid levels or advanced mucosal thickening of the visualized paranasal sinuses. No mastoid or middle ear  effusion. The orbits are normal. IMPRESSION: Normal head CT. Electronically Signed   By: Ulyses Jarred M.D.   On: 03/13/2019 02:30   Nm Pulmonary Perf And Vent  Result Date: 03/16/2019 CLINICAL DATA:  Shortness of breath EXAM: NUCLEAR MEDICINE VENTILATION - PERFUSION LUNG SCAN VIEWS: Anterior and posterior: Ventilation  and perfusion. Patient was unable to tolerate additional imaging. RADIOPHARMACEUTICALS:  32.1 mCi of Tc-62m DTPA aerosol inhalation and 4.38 mCi Tc19m MAA IV COMPARISON:  Chest radiograph March 15, 2017 FINDINGS: Ventilation: Radiotracer uptake is homogeneous and symmetric bilaterally. No evident ventilation defects. Perfusion: Radiotracer uptake is homogeneous and symmetric bilaterally. No perfusion defects evident. IMPRESSION: No ventilation or perfusion defects. Very low probability of pulmonary embolus. Electronically Signed   By: Lowella Grip III M.D.   On: 03/16/2019 13:22   Dg Chest Port 1 View  Result Date: 03/22/2019 CLINICAL DATA:  Acute respiratory failure EXAM: PORTABLE CHEST 1 VIEW COMPARISON:  Radiograph 03/22/2019 FINDINGS: Interval extubation. NG tube remains in the stomach. Central venous lines in the mid SVC unchanged. Normal cardiac silhouette. Diffuse bilateral nodular airspace disease is not improved. Increased density in the RIGHT lower lobe. No pneumothorax IMPRESSION: Is 1. No improvement in diffuse bilateral nodular airspace disease. Some increased focality in the RIGHT lower lobe. 2. Extubation. 3. Additional support apparatus stable. Electronically Signed   By: Suzy Bouchard M.D.   On: 03/22/2019 20:01   Dg Chest Port 1 View  Result Date: 03/22/2019 CLINICAL DATA:  Acute respiratory failure. Endotracheally intubated. EXAM: PORTABLE CHEST 1 VIEW COMPARISON:  03/20/2019 FINDINGS: Support lines and tubes in appropriate position. Low lung volumes are again seen. Moderate diffuse pulmonary airspace disease shows no significant change. Heart size is stable.  IMPRESSION: Moderate diffuse pulmonary airspace disease, without significant change. Electronically Signed   By: Earle Gell M.D.   On: 03/22/2019 07:34   Dg Chest Port 1 View  Result Date: 03/20/2019 CLINICAL DATA:  Central line placement EXAM: PORTABLE CHEST 1 VIEW COMPARISON:  03/19/2019 FINDINGS: There is a newly placed right IJ central venous catheter with tip projecting over the proximal SVC. The left-sided central venous catheter is stable in positioning. The endotracheal tube terminates above the carina. The enteric tube extends below the left hemidiaphragm. There are diffuse bilateral hazy airspace opacities with no significant interval improvement. No pneumothorax. The heart size is stable. IMPRESSION: 1. Newly placed right-sided central venous catheter tip terminates over the proximal SVC. There is no pneumothorax. The remaining lines and tubes are as above. 2. Otherwise, no significant interval change in appearance of the lungs. Electronically Signed   By: Constance Holster M.D.   On: 03/20/2019 01:19   Dg Chest Port 1 View  Result Date: 03/19/2019 CLINICAL DATA:  Status post intubation EXAM: PORTABLE CHEST 1 VIEW COMPARISON:  03/19/2019 FINDINGS: Endotracheal tube is now seen in satisfactory position. Gastric catheter and left jugular central line are again seen. Cardiac shadow is stable. Stable bilateral infiltrates. IMPRESSION: Endotracheal tube in satisfactory position. The remainder of the exam is stable. Electronically Signed   By: Inez Catalina M.D.   On: 03/19/2019 08:07   Dg Chest Port 1 View  Result Date: 03/19/2019 CLINICAL DATA:  Acute respiratory failure EXAM: PORTABLE CHEST 1 VIEW COMPARISON:  03/18/2019 FINDINGS: Cardiac shadow is stable. Gastric catheter and left jugular central line are again seen and stable. Patchy bilateral infiltrates are again identified and stable given some technical variation in the film. No sizable effusion or pneumothorax is noted. No bony  abnormality is seen. IMPRESSION: Stable patchy infiltrates bilaterally. Electronically Signed   By: Inez Catalina M.D.   On: 03/19/2019 08:04   Dg Chest Port 1 View  Result Date: 03/18/2019 CLINICAL DATA:  Acute respiratory failure EXAM: PORTABLE CHEST 1 VIEW COMPARISON:  03/17/2019 FINDINGS: Cardiac shadow is stable. Left jugular  central line is again seen and stable. Gastric catheter is noted coiled within the stomach. Patchy infiltrates are noted bilaterally stable from the prior exam. No new focal abnormality is noted. IMPRESSION: Stable bilateral infiltrates. Electronically Signed   By: Inez Catalina M.D.   On: 03/18/2019 07:30   Dg Chest Port 1 View  Result Date: 03/17/2019 CLINICAL DATA:  Respiratory failure, distress EXAM: PORTABLE CHEST 1 VIEW COMPARISON:  03/16/2019 FINDINGS: Left central line is unchanged. NG tube placement into the stomach. Bilateral interstitial and airspace opacities are similar prior study. Mild cardiomegaly. Small bilateral effusions. No acute bony abnormality. IMPRESSION: Diffuse interstitial and airspace opacities are similar prior study. Small effusions. Electronically Signed   By: Rolm Baptise M.D.   On: 03/17/2019 10:08   Dg Chest Port 1 View  Result Date: 03/16/2019 CLINICAL DATA:  Chest pain. EXAM: PORTABLE CHEST 1 VIEW COMPARISON:  One-view chest x-ray 03/13/2019 FINDINGS: A left IJ line is stable. Heart size is exaggerated by low lung volumes. Patchy bilateral interstitial and airspace opacities have increased. Airspace disease is asymmetric on the left. No definite effusions are present. Visualized soft tissues and bony thorax are unremarkable. IMPRESSION: 1. Increasing interstitial and airspace opacities, left greater than right. Findings are compatible with ARDS or infection. 2. Stable left IJ line. Electronically Signed   By: San Morelle M.D.   On: 03/16/2019 04:57   Dg Chest Port 1 View  Result Date: 03/13/2019 CLINICAL DATA:  Acute respiratory  failure EXAM: PORTABLE CHEST 1 VIEW COMPARISON:  03/11/2019 FINDINGS: The left-sided catheter tip projects over the SVC. The heart size is stable from prior study. There is worsening vascular congestion in worsening bilateral airspace opacities. There are likely bilateral pleural effusions. No evidence of a pneumothorax. The patient has been extubated. Enteric tube has been removed. IMPRESSION: 1. Status post extubation. 2. Remaining lines and tubes as above. 3. Worsening vascular congestion. Persistent small bilateral pleural effusions. Electronically Signed   By: Constance Holster M.D.   On: 03/13/2019 03:46   Dg Chest Port 1 View  Result Date: 03/11/2019 CLINICAL DATA:  Acute respiratory failure EXAM: PORTABLE CHEST 1 VIEW COMPARISON:  03/10/2019 FINDINGS: Cardiac shadow is stable. Endotracheal tube, gastric catheter and left jugular central line are again seen and stable. Increasing vascular congestion is noted when compare with the prior exam with enlarging left pleural effusion. No focal confluent infiltrate is seen. IMPRESSION: Worsening vascular congestion with left-sided pleural effusion. Tubes and lines as described. Electronically Signed   By: Inez Catalina M.D.   On: 03/11/2019 07:07   Dg Chest Port 1 View  Result Date: 03/10/2019 CLINICAL DATA:  Hypoxia.  Patient on a ventilator. EXAM: PORTABLE CHEST 1 VIEW COMPARISON:  Earlier film, same date. FINDINGS: The endotracheal tube is in good position, 5 cm above the carina. The NG tube is coursing down the esophagus and into the stomach. The left IJ central venous catheter is stable. The lungs demonstrate improved aeration compared to the earlier film. Less edema and atelectasis. No pneumothorax. IMPRESSION: 1. Support apparatus in good position without complicating features. 2. Improved lung aeration since earlier chest film. Electronically Signed   By: Marijo Sanes M.D.   On: 03/10/2019 20:35   Portable Chest Xray  Result Date:  03/10/2019 CLINICAL DATA:  Respiratory failure EXAM: PORTABLE CHEST 1 VIEW COMPARISON:  03/09/2019 FINDINGS: Endotracheal tube tip at the clavicular heads. The orogastric tube at least reaches the stomach. Left IJ dialysis catheter tip at the upper cavoatrial junction.  Worsening aeration with obscured left diaphragm. There is diffuse hazy lung opacity. Suspect layering pleural effusions. Cardiomegaly and vascular pedicle widening. IMPRESSION: Stable, unremarkable hardware positioning. Worsening aeration, likely atelectasis, layering fluid, and edema. Electronically Signed   By: Monte Fantasia M.D.   On: 03/10/2019 06:22   Dg Chest Port 1 View  Result Date: 03/09/2019 CLINICAL DATA:  Central line placement EXAM: PORTABLE CHEST 1 VIEW COMPARISON:  03/09/2019 FINDINGS: The endotracheal tube terminates above the carina. The newly placed left-sided central venous catheter tip projects over the SVC/distal left brachiocephalic vein. The enteric tube extends below the left hemidiaphragm. There is no left-sided pneumothorax. The heart size is stable. No right-sided pneumothorax. Coarsened lung markings are again identified bilaterally. IMPRESSION: 1. Newly placed left-sided central venous catheter as above with no evidence of a pneumothorax. 2. Remaining lines and tubes as above. 3. Stable cardiac size. Persistent coarse airspace opacities bilaterally similar to prior study. Electronically Signed   By: Constance Holster M.D.   On: 03/09/2019 19:58   Dg Chest Port 1 View  Result Date: 03/09/2019 CLINICAL DATA:  28 year old male presenting after drug overdose. EXAM: PORTABLE CHEST 1 VIEW COMPARISON:  02/09/2019 and earlier. FINDINGS: Portable AP semi upright view at 0510 hours. Intubated, endotracheal tube tip at the level the clavicles. Enteric tube courses to the abdomen, tip not included. Normal cardiac size and mediastinal contours. Patchy and confluent perihilar and lung base opacity. No superimposed pneumothorax.  No overt edema. No pleural effusion identified. Paucity of bowel gas in the upper abdomen. No osseous abnormality identified. IMPRESSION: 1. Endotracheal tube tip at the level the clavicles. Enteric tube courses to the abdomen, tip not included. 2. Patchy and confluent perihilar and lung base opacity. Consider aspiration. No pulmonary edema or pleural effusion. Electronically Signed   By: Genevie Ann M.D.   On: 03/09/2019 05:45   [place] Ekg Site Rite  Result Date: 03/19/2019 If Site Rite image not attached, placement could not be confirmed due to current cardiac rhythm.   Consults: Treatment Team:  Murlean Iba, MD Lavella Hammock, MD [facility], [facility], MD   Subjective:    Overnight Issues: On AVAPS but really in no distress.  Can be transitioned to nasal cannula.  Awake and alert.  Objective:  Vital signs for last 24 hours: Temp:  [98.3 F (36.8 C)-99.9 F (37.7 C)] 99.9 F (37.7 C) (06/16 1200) Pulse Rate:  [65-102] 99 (06/16 1500) Resp:  [13-31] 31 (06/16 1500) BP: (111-137)/(71-88) 137/81 (06/16 1500) SpO2:  [89 %-100 %] 94 % (06/16 1500) FiO2 (%):  [50 %-60 %] 50 % (06/16 1200) Weight:  [87.4 kg] 87.4 kg (06/16 0421)  Hemodynamic parameters for last 24 hours:    Intake/Output from previous day: 06/15 0701 - 06/16 0700 In: 115.2 [I.V.:10; IV Piggyback:105.2] Out: 5675 [Urine:2175]  Intake/Output this shift: Total I/O In: 120 [NG/GT:70; IV Piggyback:50] Out: 1745 [Urine:1745]  Vent settings for last 24 hours: FiO2 (%):  [50 %-60 %] 50 %  Physical Exam:  General: Overweight male,on AVAPS HEENT: PERRLA, trachea midline, thick neck  Neuro:  Sleepy, no focal deficits Cardiovascular:  pulse regular, S1-S2, no murmur regurg or gallop, +2 pulses bilaterally, +2 anasarca  Lungs: Coarse breath sounds.  No increased use of accessories today, no respiratory distress. Abdomen: Soft, obese, sluggish bowel sounds but present, NG out tolerating clear liquids Musculoskeletal: No  joint deformities, positive range of motion Skin: Warm and dry, no rash or lesions  Assessment/Plan:   1.  Acute respiratory  failure: On AVAPS.  Suspect that he can transition to nasal cannula.  Persistent issues with volume overload.  Discussed with Dr. Juleen China, trial of Lasix today.  No hemodialysis today.  2.  Ileus: Replace electrolytes as needed.  Continues to improve.  Advance diet as tolerated  3.  Acute renal failure: Nonoliguric.  Continue Foley catheter due to need for strict I&O's.  Received diuretic with good response, repeat Lasix 80 mg IV x1 today.  No dialysis today  4.  Group B strep pneumonia: Day 7 of antibiotics, discontinue today.   5.  Nausea and vomiting: Resolved.  6.  Severe depression with suicidal ideation and attempt: Should be reevaluated by psychiatry.   LOS: 15 days   Additional comments: Multidisciplinary rounds were performed with the ICU team.    Critical Care Total Time*:   C. Derrill Kay, MD [facility] 03/24/2019  *Care during the described time interval was provided by me and/or other providers on the critical care team.  I have reviewed this patient's available data, including medical history, events of note, physical examination and test results as part of my evaluation.

## 2019-03-24 NOTE — Progress Notes (Signed)
   03/24/19 1825  Clinical Encounter Type  Visited With Patient  Visit Type Follow-up  Referral From Chaplain  Consult/Referral To Chaplain  Spiritual Encounters  Spiritual Needs Prayer;Emotional  Patient was resting upon arrival. Woke up to [facility] verbal stimuli. Talkative. Religious. [place] provided pastoral care through building rapport. Patient is distressed about the whereabouts of family. Staff have already made attempts to find family according to patient. Prayed with patient upon request. Patient prayed for himself as well.  Follow up visits are welcomed.

## 2019-03-24 NOTE — Progress Notes (Signed)
Pt is a 28 y.o. on dialysis that is currently on a Bi pap. Ch f/u with pt per request of another chaplain. Ch noticed that the pt had a sitter and was lying flat on the bed while hooked to Bi pap. Pt was able to respond by nodding his head and but did not speak through the b- pap. Ch was informed by pt's nurse that he is currently NPO and had his dialysis treatment yesterday and could have a visit at anytime today. F/u care should include bedside companionship with pt.    03/24/19 1300  Clinical Encounter Type  Visited With Health care provider;Patient  Visit Type Follow-up;Critical Care  Referral From Chaplain  Consult/Referral To Chaplain  Stress Factors  Patient Stress Factors Health changes;Loss of control;Major life changes  Family Stress Factors None identified

## 2019-03-24 NOTE — Progress Notes (Signed)
Glenn Rasmussen at [place] NAME: Glenn Rasmussen    MR#:  [id number]  DATE OF BIRTH:  1991/06/06  SUBJECTIVE:   Patient continues to require BiPAP  REVIEW OF SYSTEMS:   CONSTITUTIONAL: No documented fever. No fatigue, weakness. No weight gain, no weight loss.  EYES: No blurry or double vision.  ENT: No tinnitus. No postnasal drip. No redness of the oropharynx.  RESPIRATORY: No cough, no wheeze, no hemoptysis.  Positive dyspnea.  CARDIOVASCULAR: No chest pain. No orthopnea. No palpitations. No syncope.  GASTROINTESTINAL: No nausea, no vomiting or diarrhea. No abdominal pain. No melena or hematochezia.  GENITOURINARY:  No urgency. No frequency. No dysuria. No hematuria. No obstructive symptoms. No discharge. No pain. No significant abnormal bleeding ENDOCRINE: No polyuria or nocturia. No heat or cold intolerance.  HEMATOLOGY: No anemia. No bruising. No bleeding. No purpura. No petechiae INTEGUMENTARY: No rashes. No lesions.  MUSCULOSKELETAL: No arthritis. No swelling. No gout.  NEUROLOGIC: No numbness, tingling, or ataxia. No seizure-type activity.  PSYCHIATRIC: No anxiety. No insomnia. No ADD.     DRUG ALLERGIES:  No Known Allergies  VITALS:  Blood pressure 131/88, pulse 65, temperature 99.9 F (37.7 C), temperature source Axillary, resp. rate (!) 22, height 5' 7.01" (1.702 m), weight 87.4 kg, SpO2 95 %.  PHYSICAL EXAMINATION:  Physical Exam   GENERAL:  28 y.o.-year-old critically ill-appearing patient lying in the bed on BiPAP EYES: Pupils equal, round, reactive to light and accommodation. No scleral icterus. Extraocular muscles intact.  HEENT: Head atraumatic, normocephalic. Oropharynx and nasopharynx clear.  NECK:  Supple, no jugular venous distention. No thyroid enlargement, no tenderness.   LUNGS: distant breath sounds bilaterally, no wheezing Mild use of accessory muscles of respiration.  Decreased bibasilar breath sounds  CARDIOVASCULAR: S1, S2 normal. No murmurs, rubs, or gallops.  ABDOMEN: Soft, nontender, nondistended. Bowel sounds present. No organomegaly or mass.  EXTREMITIES: No  cyanosis, or clubbing.  1+ pedal edema noted NEUROLOGIC: Patient sedated    PSYCHIATRIC: on the vent SKIN: No obvious rash, lesion, or ulcer.    LABORATORY PANEL:   CBC Recent Labs  Lab 03/23/19 0423  WBC 14.6*  HGB 7.5*  HCT 22.6*  PLT 394   ------------------------------------------------------------------------------------------------------------------  Chemistries  Recent Labs  Lab 03/23/19 0423 03/24/19 0446  NA 143 145  K 4.9 4.9  CL 99 102  CO2 24 25  GLUCOSE 144* 124*  BUN 141* 99*  CREATININE 6.86* 5.04*  CALCIUM 7.7* 8.3*  MG 2.5* 2.5*  AST 40  --   ALT 60*  --   ALKPHOS 97  --   BILITOT 0.8  --    ------------------------------------------------------------------------------------------------------------------  Cardiac Enzymes No results for input(s): TROPONINI in the last 168 hours. ------------------------------------------------------------------------------------------------------------------  RADIOLOGY:  Dg Chest Port 1 View  Result Date: 03/22/2019 CLINICAL DATA:  Acute respiratory failure EXAM: PORTABLE CHEST 1 VIEW COMPARISON:  Radiograph 03/22/2019 FINDINGS: Interval extubation. NG tube remains in the stomach. Central venous lines in the mid SVC unchanged. Normal cardiac silhouette. Diffuse bilateral nodular airspace disease is not improved. Increased density in the RIGHT lower lobe. No pneumothorax IMPRESSION: Is 1. No improvement in diffuse bilateral nodular airspace disease. Some increased focality in the RIGHT lower lobe. 2. Extubation. 3. Additional support apparatus stable. Electronically Signed   By: Suzy Bouchard M.D.   On: 03/22/2019 20:01    EKG:   Orders placed or performed during the hospital encounter of 03/08/19  . ED EKG  .  ED EKG  . ED EKG  . ED EKG  .  EKG 12-Lead  . EKG 12-Lead  . EKG 12-Lead  . EKG 12-Lead  . EKG 12-Lead  . EKG 12-Lead  . EKG 12-Lead  . EKG 12-Lead  . EKG 12-Lead  . EKG 12-Lead  . EKG 12-Lead  . EKG 12-Lead  . EKG 12-Lead  . EKG 12-Lead  . EKG - 12 lead  . EKG - 12 lead  . EKG 12-Lead  . EKG 12-Lead    ASSESSMENT AND PLAN:   28 year old male with past medical history significant for hypertension, IBS admitted secondary to overdose on Phenergan, losartan, Norvasc and Klonopin  1.  Acute Hypoxic respiratory failure due to volume overload/?ARDS -Patient is extubated on 03/12/2019--re-intubated on 03/19/2019, then extubated on 03/23/2019 now back on BiPAP -EKG--sinus tachycardia -VQ scan low probability for PE. D/ced heparin drip. -Continue hemodialysis as per nephrology  2.Intentional drug overdose -patient overdosed on 38 tablets of Phenergan, 19 tablets of losartan, 42 tablets of Norvasc and 78 tablets of Klonopin -Urine tox positive for benzos and tricyclics -Appreciate psych consult.  Once patient is medically stable, will need inpatient psychiatry admission.  3.  Acute renal failure-likely ATN from hypotension.  -received on CRRT, metabolic acidosis.  Appreciate nephrology consult Now having hemodialysis  4. Pneumonia-follow-up procalcitonin and WBC. - Currently on Iv Zosyn   5.  DVT prophylaxis- on heparin SQ  6.  GERD-Protonix  7.  Elevated troponin-likely demand ischemia-  Most recent echo this admission showing normal LV function and no wall motion abnormalities.  8.Anemia appears due to current illness with renal failure No active bleed follow CBC and BMP    CODE STATUS: Full code  TOTAL TIME TAKING CARE OF THIS PATIENT: 35 minutes.   POSSIBLE D/C IN ? DAYS, DEPENDING ON CLINICAL CONDITION.  Overall long-term prognosis poor  Dustin Flock M.D on 03/24/2019 at 12:36 PM  Between 7am to 6pm - Pager - [phone]  After 6pm go to [email or web address] - password [email or web address]  Office  [phone]  CC: Primary care physician; Maeola Sarah, MD

## 2019-03-24 NOTE — Progress Notes (Signed)
Assisted tele visit to patient with family member.  Yancey Pedley William, RN   

## 2019-03-24 NOTE — Consult Note (Signed)
[facility] Face-to-Face Psychiatry Consult follow-up  Reason for Consult: Suicide attempt by overdose Referring Physician: ICU provider Patient Identification: Glenn Rasmussen MRN:  [id number] Principal Diagnosis: Multiple drug overdose Diagnosis:  Principal Problem:   Multiple drug overdose Active Problems:   Suicide attempt ([place])   HTN (hypertension)   AKI (acute kidney injury) ([place])   Acute respiratory failure ([place])   Antihypertensive agent overdose   Calcium channel blocker overdose   MDD (major depressive disorder), recurrent severe, without psychosis ([place])   Intentional benzodiazepine overdose ([place])  Patient is seen, chart is reviewed.  Patient has provided permission to speak with mother, [name] [phone]) Collateral obtained from ICU attending. Total Time spent with patient: 25 min  Subjective: Patient on high flow oxygen.  "I am optimistic."  HPI:  Glenn Rasmussen is a 28 y.o. male patient admitted with drug overdose and suicide attempt.  Patient presents to the ED reporting multiple drug overdose.  He states that he is depressed and "just does not care about life anymore".  He states that he took a number of medications tonight including benzodiazepine, calcium channel blocker, Phenergan, amlodipine, and "just did not care what happened".  He is currently hypotensive, though awake and alert.  Treatment for his overdose was initiated in the ED and hospitalist were called for admission. Patient has been receiving dialysis treatments.  Early this morning 03/17/2019, patient had acute respiratory distress and abdominal pain and was returned to the intensive care unit.  Patient is now receiving ventilatory support, and has an NG tube in place to suction.  Surgical consultation is pending.  Prognosis remains guarded, patient may have end-stage renal disease.  Psychiatry consult is requested for evaluation of ongoing suicidal ideation and treatment of depression.  Past  Psychiatric History: Per medication review, patient appears to be treated for ADHD, anxiety, and insomnia. Patient reports being on multiple antidepressants in the past, and states, "none of them worked for me.  They all made me feel sick to my stomach and caused me to vomit."  Risk to Self:  Yes Risk to Others:  Not known Prior Inpatient Therapy:  Not known Prior Outpatient Therapy:  Not known  On evaluation today, patient is in bed.  He states he is optimistic, since he has had a bowel movement, is off the ventilator, and received 2 units of albumin.  He states he believes his kidneys will recover.  Patient describes his mood as, "okay."  He does not recall continuing on Remeron at bedtime, however medical administration record shows prescribed, however patient received last dose 03/22/2019.  Patient would like to continue this medication for his depression.  Patient is denying suicidal ideation at this time.  He denies HI and AVH.   Past Medical History:  Past Medical History:  Diagnosis Date  . Hypertension   . IBS (irritable bowel syndrome)     Past Surgical History:  Procedure Laterality Date  . APPENDECTOMY    . CHOLECYSTECTOMY    . TONSILLECTOMY     Family History:  Family History  Problem Relation Age of Onset  . Lung cancer Mother   . Lung cancer Father    Family Psychiatric  History: Unknown  Social History:  Social History   Substance and Sexual Activity  Alcohol Use No  . Frequency: Never     Social History   Substance and Sexual Activity  Drug Use No    Social History   Socioeconomic History  . Marital status: Single  Spouse name: Not on file  . Number of children: Not on file  . Years of education: Not on file  . Highest education level: Not on file  Occupational History  . Not on file  Social Needs  . Financial resource strain: Not on file  . Food insecurity    Worry: Not on file    Inability: Not on file  . Transportation needs    Medical:  Not on file    Non-medical: Not on file  Tobacco Use  . Smoking status: Never Smoker  . Smokeless tobacco: Never Used  Substance and Sexual Activity  . Alcohol use: No    Frequency: Never  . Drug use: No  . Sexual activity: Not on file  Lifestyle  . Physical activity    Days per week: Not on file    Minutes per session: Not on file  . Stress: Not on file  Relationships  . Social Herbalist on phone: Not on file    Gets together: Not on file    Attends religious service: Not on file    Active member of club or organization: Not on file    Attends meetings of clubs or organizations: Not on file    Relationship status: Not on file  Other Topics Concern  . Not on file  Social History Narrative  . Not on file   Additional Social History:    Patient lives alone.  He reports his plan for discharge is to return to living independently.  Allergies:  No Known Allergies  Labs:  Results for orders placed or performed during the hospital encounter of 03/08/19 (from the past 48 hour(s))  Glucose, capillary     Status: Abnormal   Collection Time: 03/22/19  5:28 PM  Result Value Ref Range   Glucose-Capillary 136 (H) 70 - 99 mg/dL  Blood gas, arterial     Status: Abnormal   Collection Time: 03/22/19  8:30 PM  Result Value Ref Range   FIO2 0.70    Delivery systems BILEVEL POSITIVE AIRWAY PRESSURE    Inspiratory PAP 16    Expiratory PAP 8    pH, Arterial 7.49 (H) 7.350 - 7.450   pCO2 arterial 33 32.0 - 48.0 mmHg   pO2, Arterial 171 (H) 83.0 - 108.0 mmHg   Bicarbonate 25.1 20.0 - 28.0 mmol/L   Acid-Base Excess 2.2 (H) 0.0 - 2.0 mmol/L   O2 Saturation 99.6 %   Patient temperature 37.0    Collection site LEFT RADIAL    Sample type ARTERIAL DRAW    Allens test (pass/fail) PASS PASS    Comment: Performed at [facility], [address] [place], [place] [address]  Glucose, capillary     Status: Abnormal   Collection Time: 03/22/19 11:41 PM  Result Value Ref  Range   Glucose-Capillary 120 (H) 70 - 99 mg/dL  Magnesium     Status: Abnormal   Collection Time: 03/23/19  4:23 AM  Result Value Ref Range   Magnesium 2.5 (H) 1.7 - 2.4 mg/dL    Comment: Performed at [facility], [address] [place], [place] [address]  Triglycerides     Status: Abnormal   Collection Time: 03/23/19  4:23 AM  Result Value Ref Range   Triglycerides 263 (H) <150 mg/dL    Comment: Performed at [facility], [address] [place],  [address]  CBC     Status: Abnormal   Collection Time: 03/23/19  4:23  AM  Result Value Ref Range   WBC 14.6 (H) 4.0 - 10.5 K/uL   RBC 2.75 (L) 4.22 - 5.81 MIL/uL   Hemoglobin 7.5 (L) 13.0 - 17.0 g/dL   HCT 22.6 (L) 39.0 - 52.0 %   MCV 82.2 80.0 - 100.0 fL   MCH 27.3 26.0 - 34.0 pg   MCHC 33.2 30.0 - 36.0 g/dL   RDW 13.6 11.5 - 15.5 %   Platelets 394 150 - 400 K/uL   nRBC 0.0 0.0 - 0.2 %    Comment: Performed at [facility], [address] [place], [place] [address]  Comprehensive metabolic panel     Status: Abnormal   Collection Time: 03/23/19  4:23 AM  Result Value Ref Range   Sodium 143 135 - 145 mmol/L   Potassium 4.9 3.5 - 5.1 mmol/L   Chloride 99 98 - 111 mmol/L   CO2 24 22 - 32 mmol/L   Glucose, Bld 144 (H) 70 - 99 mg/dL   BUN 141 (H) 6 - 20 mg/dL    Comment: RESULT CONFIRMED BY MANUAL DILUTION/RWW   Creatinine, Ser 6.86 (H) 0.61 - 1.24 mg/dL   Calcium 7.7 (L) 8.9 - 10.3 mg/dL   Total Protein 6.4 (L) 6.5 - 8.1 g/dL   Albumin 3.9 3.5 - 5.0 g/dL   AST 40 15 - 41 U/L   ALT 60 (H) 0 - 44 U/L   Alkaline Phosphatase 97 38 - 126 U/L   Total Bilirubin 0.8 0.3 - 1.2 mg/dL   GFR calc non Af Amer 10 (L) >60 mL/min   GFR calc Af Amer 11 (L) >60 mL/min   Anion gap 20 (H) 5 - 15    Comment: Performed at [facility], [address], [place], [place] [address]  Phosphorus     Status: Abnormal   Collection Time: 03/23/19  4:23 AM  Result Value Ref Range   Phosphorus 9.2 (H) 2.5 -  4.6 mg/dL    Comment: Performed at [facility], [address] [place], [place] [address]  Blood gas, arterial     Status: Abnormal   Collection Time: 03/23/19  4:24 AM  Result Value Ref Range   FIO2 0.45    Delivery systems BILEVEL POSITIVE AIRWAY PRESSURE    Inspiratory PAP 16    Expiratory PAP 8    pH, Arterial 7.48 (H) 7.350 - 7.450   pCO2 arterial 34 32.0 - 48.0 mmHg   pO2, Arterial 70 (L) 83.0 - 108.0 mmHg   Bicarbonate 25.3 20.0 - 28.0 mmol/L   Acid-Base Excess 2.2 (H) 0.0 - 2.0 mmol/L   O2 Saturation 95.0 %   Patient temperature 37.0    Collection site LEFT RADIAL    Sample type ARTERIAL DRAW    Allens test (pass/fail) PASS PASS    Comment: Performed at [facility], [address] [place], [place] [address]  Glucose, capillary     Status: Abnormal   Collection Time: 03/23/19  5:39 AM  Result Value Ref Range   Glucose-Capillary 145 (H) 70 - 99 mg/dL  Glucose, capillary     Status: Abnormal   Collection Time: 03/23/19 12:38 PM  Result Value Ref Range   Glucose-Capillary 108 (H) 70 - 99 mg/dL   Comment 1 Notify RN   Glucose, capillary     Status: Abnormal   Collection Time: 03/23/19  5:09 PM  Result Value Ref Range   Glucose-Capillary 123 (H) 70 - 99 mg/dL   Comment  1 Notify RN   Glucose, capillary     Status: Abnormal   Collection Time: 03/23/19 11:19 PM  Result Value Ref Range   Glucose-Capillary 103 (H) 70 - 99 mg/dL  Magnesium     Status: Abnormal   Collection Time: 03/24/19  4:46 AM  Result Value Ref Range   Magnesium 2.5 (H) 1.7 - 2.4 mg/dL    Comment: Performed at [facility], [address] [place], [place] [address]  Basic metabolic panel     Status: Abnormal   Collection Time: 03/24/19  4:46 AM  Result Value Ref Range   Sodium 145 135 - 145 mmol/L   Potassium 4.9 3.5 - 5.1 mmol/L   Chloride 102 98 - 111 mmol/L   CO2 25 22 - 32 mmol/L   Glucose, Bld 124 (H) 70 - 99 mg/dL   BUN 99 (H) 6 - 20 mg/dL    Comment: RESULT  CONFIRMED BY MANUAL DILUTION SDR   Creatinine, Ser 5.04 (H) 0.61 - 1.24 mg/dL   Calcium 8.3 (L) 8.9 - 10.3 mg/dL   GFR calc non Af Amer 14 (L) >60 mL/min   GFR calc Af Amer 16 (L) >60 mL/min   Anion gap 18 (H) 5 - 15    Comment: Performed at [facility], [address] [place], [place] [address]  Glucose, capillary     Status: Abnormal   Collection Time: 03/24/19  5:48 AM  Result Value Ref Range   Glucose-Capillary 105 (H) 70 - 99 mg/dL  Glucose, capillary     Status: Abnormal   Collection Time: 03/24/19  7:27 AM  Result Value Ref Range   Glucose-Capillary 117 (H) 70 - 99 mg/dL  Glucose, capillary     Status: Abnormal   Collection Time: 03/24/19 11:27 AM  Result Value Ref Range   Glucose-Capillary 108 (H) 70 - 99 mg/dL  Troponin I - ONCE - STAT     Status: Abnormal   Collection Time: 03/24/19  2:15 PM  Result Value Ref Range   Troponin I 0.06 (HH) <0.03 ng/mL    Comment: CRITICAL VALUE NOTED. VALUE IS CONSISTENT WITH PREVIOUSLY REPORTED/CALLED VALUE...[facility] Performed at [facility], [address], [place], [place] [address]     Current Facility-Administered Medications  Medication Dose Route Frequency Provider Last Rate Last Dose  . 0.9 %  sodium chloride infusion   Intravenous PRN Fritzi Mandes, MD   Stopped at 03/18/19 1430  . aspirin EC tablet 325 mg  325 mg Oral Daily Charlett Nose, RPH      . B-complex with vitamin C tablet 1 tablet  1 tablet Oral Daily Charlett Nose, RPH      . bisacodyl (DULCOLAX) suppository 10 mg  10 mg Rectal Daily PRN Tukov-Yual, Magdalene S, NP      . budesonide (PULMICORT) nebulizer solution 0.5 mg  0.5 mg Nebulization BID Flora Lipps, MD   0.5 mg at 03/24/19 0810  . Chlorhexidine Gluconate Cloth 2 % PADS 6 each  6 each Topical Q0600 Lance Coon, MD   6 each at 03/23/19 1734  . haloperidol lactate (HALDOL) injection 2 mg  2 mg Intramuscular Q6H PRN Mansy, Jan A, MD   2 mg at 03/23/19 0645  . heparin injection 5,000  Units  5,000 Units Subcutaneous Q8H Tyler Pita, MD   5,000 Units at 03/24/19 0550  . insulin aspart (novoLOG) injection 0-9 Units  0-9 Units Subcutaneous Q6H Dallie Piles, RPH   1 Units  at 03/23/19 1725  . ipratropium-albuterol (DUONEB) 0.5-2.5 (3) MG/3ML nebulizer solution 3 mL  3 mL Nebulization Q6H PRN Flora Lipps, MD   3 mL at 03/17/19 0818  . ipratropium-albuterol (DUONEB) 0.5-2.5 (3) MG/3ML nebulizer solution 3 mL  3 mL Nebulization Q4H Flora Lipps, MD   3 mL at 03/24/19 1202  . LORazepam (ATIVAN) injection 2-4 mg  2-4 mg Intravenous Q1H PRN Flora Lipps, MD   2 mg at 03/24/19 0644  . methylPREDNISolone sodium succinate (SOLU-MEDROL) 40 mg/mL injection 40 mg  40 mg Intravenous BID Flora Lipps, MD   40 mg at 03/24/19 0908  . mirtazapine (REMERON SOL-TAB) disintegrating tablet 15 mg  15 mg Oral QHS Lavella Hammock, MD   15 mg at 03/22/19 2155  . multivitamin with minerals tablet 1 tablet  1 tablet Oral Daily Charlett Nose, RPH      . pantoprazole (PROTONIX) EC tablet 40 mg  40 mg Oral QHS Charlett Nose, RPH      . piperacillin-tazobactam (ZOSYN) IVPB 3.375 g  3.375 g Intravenous Q12H Tyler Pita, MD 12.5 mL/hr at 03/24/19 1200    . polyethylene glycol (MIRALAX / GLYCOLAX) packet 17 g  17 g Per Tube Daily PRN Flora Lipps, MD      . sodium chloride flush (NS) 0.9 % injection 10-40 mL  10-40 mL Intracatheter Q12H Darel Hong D, NP   10 mL at 03/23/19 2120  . sodium chloride flush (NS) 0.9 % injection 10-40 mL  10-40 mL Intracatheter PRN [name], NP        Musculoskeletal: Strength & Muscle Tone: decreased Gait & Station: Not assessed Patient leans: N/A  Psychiatric Specialty Exam: Physical Exam  Nursing note and vitals reviewed. Constitutional: He is oriented to person, place, and time. He appears well-developed and well-nourished. No distress.  HENT:  Head: Normocephalic and atraumatic.  Eyes: EOM are normal.  Neck: Normal range of motion.   Cardiovascular: Regular rhythm.  Tachycardia  Respiratory:  Labored breathing on high flow oxygen via nasal cannula  Musculoskeletal: Normal range of motion.  Neurological: He is alert and oriented to person, place, and time.    Review of Systems  Constitutional: Positive for malaise/fatigue.  Respiratory: Positive for shortness of breath.   Cardiovascular: Negative.  Negative for chest pain and palpitations.  Gastrointestinal: Positive for abdominal pain, constipation and nausea.  Musculoskeletal: Positive for myalgias.  Neurological: Positive for weakness.  Psychiatric/Behavioral: Positive for depression. Negative for hallucinations, substance abuse and suicidal ideas. The patient is nervous/anxious. The patient does not have insomnia.     Blood pressure 129/85, pulse (!) 102, temperature 99.9 F (37.7 C), temperature source Axillary, resp. rate 20, height 5' 7.01" (1.702 m), weight 87.4 kg, SpO2 98 %.Body mass index is 30.17 kg/m.  General Appearance: Fairly Groomed  Eye Contact:  Good  Speech:  Clear and Coherent  Volume:  Decreased  Mood:  Anxious and Depressed  Affect:  Depressed and Tearful  Thought Process:  Coherent  Orientation:  Full (Time, Place, and Person)  Thought Content:  Hallucinations: None  Suicidal Thoughts:  Denies currently, following suicide attempt by overdose  Homicidal Thoughts:  No  Memory:  NA  Judgement:  Poor  Insight:  Present and Shallow  Psychomotor Activity:  Decreased  Concentration:  Concentration: Fair  Recall:  [place] of Knowledge:  Good  Language:  Fair  Akathisia:  No  Handed:  Right  AIMS (if indicated):     Assets:  Communication Skills Desire for Improvement Social Support patient has given consent to talk to mother, [name] [phone]  ADL's:  Impaired  Cognition:  WNL  Sleep:   decreased     Treatment Plan Summary: Daily contact with patient to assess and evaluate symptoms and progress in treatment and  Medication management  Continue Remeron SolTab 15 mg at bedtime for depression and anxiety.  Disposition: Recommend psychiatric Inpatient admission when medically cleared. Supportive therapy provided about ongoing stressors.  patient has given consent to talk to mother, [name] [phone]  Psychiatry will continue to follow and advise medication management as indicated.  Lavella Hammock, MD 03/24/2019 3:05 PM

## 2019-03-25 ENCOUNTER — Other Ambulatory Visit (INDEPENDENT_AMBULATORY_CARE_PROVIDER_SITE_OTHER): Payer: Self-pay | Admitting: Vascular Surgery

## 2019-03-25 DIAGNOSIS — T426X2A Poisoning by other antiepileptic and sedative-hypnotic drugs, intentional self-harm, initial encounter: Principal | ICD-10-CM

## 2019-03-25 DIAGNOSIS — N19 Unspecified kidney failure: Secondary | ICD-10-CM

## 2019-03-25 DIAGNOSIS — I10 Essential (primary) hypertension: Secondary | ICD-10-CM

## 2019-03-25 DIAGNOSIS — F329 Major depressive disorder, single episode, unspecified: Secondary | ICD-10-CM

## 2019-03-25 LAB — CBC
HCT: 22.3 % — ABNORMAL LOW (ref 39.0–52.0)
Hemoglobin: 7.3 g/dL — ABNORMAL LOW (ref 13.0–17.0)
MCH: 27.3 pg (ref 26.0–34.0)
MCHC: 32.7 g/dL (ref 30.0–36.0)
MCV: 83.5 fL (ref 80.0–100.0)
Platelets: 375 10*3/uL (ref 150–400)
RBC: 2.67 MIL/uL — ABNORMAL LOW (ref 4.22–5.81)
RDW: 13.3 % (ref 11.5–15.5)
WBC: 13.1 10*3/uL — ABNORMAL HIGH (ref 4.0–10.5)
nRBC: 0 % (ref 0.0–0.2)

## 2019-03-25 LAB — RENAL FUNCTION PANEL
Albumin: 3.9 g/dL (ref 3.5–5.0)
Anion gap: 20 — ABNORMAL HIGH (ref 5–15)
BUN: 129 mg/dL — ABNORMAL HIGH (ref 6–20)
CO2: 23 mmol/L (ref 22–32)
Calcium: 8.7 mg/dL — ABNORMAL LOW (ref 8.9–10.3)
Chloride: 109 mmol/L (ref 98–111)
Creatinine, Ser: 5.6 mg/dL — ABNORMAL HIGH (ref 0.61–1.24)
GFR calc Af Amer: 15 mL/min — ABNORMAL LOW (ref >60)
GFR calc non Af Amer: 13 mL/min — ABNORMAL LOW (ref >60)
Glucose, Bld: 136 mg/dL — ABNORMAL HIGH (ref 70–99)
Phosphorus: 8.7 mg/dL — ABNORMAL HIGH (ref 2.5–4.6)
Potassium: 4.4 mmol/L (ref 3.5–5.1)
Sodium: 152 mmol/L — ABNORMAL HIGH (ref 135–145)

## 2019-03-25 LAB — GLUCOSE, CAPILLARY
Glucose-Capillary: 124 mg/dL — ABNORMAL HIGH (ref 70–99)
Glucose-Capillary: 207 mg/dL — ABNORMAL HIGH (ref 70–99)
Glucose-Capillary: 137 mg/dL — ABNORMAL HIGH (ref 70–99)
Glucose-Capillary: 160 mg/dL — ABNORMAL HIGH (ref 70–99)

## 2019-03-25 LAB — MAGNESIUM: Magnesium: 2.7 mg/dL — ABNORMAL HIGH (ref 1.7–2.4)

## 2019-03-25 MED ORDER — EPOETIN ALFA 10000 UNIT/ML IJ SOLN
10000.0000 [IU] | INTRAMUSCULAR | Status: DC
  Administered 2019-03-25: 10000 [IU] via INTRAVENOUS

## 2019-03-25 MED ORDER — HALOPERIDOL LACTATE 5 MG/ML IJ SOLN: 2.0000 mg | Freq: Four times a day (QID) | INTRAMUSCULAR | Status: DC | PRN

## 2019-03-25 MED ORDER — DEXMEDETOMIDINE HCL IN NACL 400 MCG/100ML IV SOLN
0.4000 ug/kg/h | INTRAVENOUS | Status: DC
  Administered 2019-03-25: 06:00:00 0.6 ug/kg/h via INTRAVENOUS
  Administered 2019-03-25: 09:00:00 0.5 ug/kg/h via INTRAVENOUS
  Filled 2019-03-25 (×2): qty 100

## 2019-03-25 MED ORDER — RENA-VITE PO TABS
1.0000 | ORAL_TABLET | Freq: Every day | ORAL | Status: DC
  Administered 2019-03-25 – 2019-03-29 (×5): 1 via ORAL
  Filled 2019-03-25 (×7): qty 1

## 2019-03-25 MED ORDER — DEXTROSE 5 % IV SOLN: INTRAVENOUS | Status: DC

## 2019-03-25 MED ORDER — NEPRO/CARBSTEADY PO LIQD
237.0000 mL | Freq: Two times a day (BID) | ORAL | Status: DC
  Administered 2019-03-26 – 2019-03-29 (×5): 237 mL via ORAL

## 2019-03-25 NOTE — Progress Notes (Signed)
Pt making multiple attempts to get OOB.  Sitter at bedside

## 2019-03-25 NOTE — Progress Notes (Signed)
[facility]  ROUNDING NOTE   Subjective:   Sitter at bedside.  Confusion overnight Tmax 100.4  UOP 3536mL.  Na 152 BUN 152 (99)  Objective:  Vital signs in last 24 hours:  Temp:  [98.8 F (37.1 C)-100.4 F (38 C)] 98.8 F (37.1 C) (06/17 0300) Pulse Rate:  [61-110] 85 (06/17 1000) Resp:  [17-33] 33 (06/17 1000) BP: (111-139)/(63-88) 128/81 (06/17 1000) SpO2:  [84 %-100 %] 96 % (06/17 1000) FiO2 (%):  [50 %-100 %] 60 % (06/17 0800) Weight:  [78.5 kg] 78.5 kg (06/17 0413)  Weight change: -17.8 kg Filed Weights   03/23/19 1335 03/24/19 0421 03/25/19 0413  Weight: 93.1 kg 87.4 kg 78.5 kg    Intake/Output: I/O last 3 completed shifts: In: 203.8 [I.V.:28.6; NG/GT:70; IV Piggyback:105.3] Out: 4860 [Urine:4860]   Intake/Output this shift:  Total I/O In: 58.5 [I.V.:38.5; Other:20] Out: 600 [Urine:600]  Physical Exam: General: Critically ill  Head: BIPAP   Eyes: Anicteric, PERRL  Neck: LIJ HD catheter, RIJ tlc  Lungs:  Bilateral wheezing  Heart: tachycardia  Abdomen:  +distended, + hypoactive bowel sounds.   Extremities:  no peripheral edema.  Neurologic: Nonfocal, moving all four extremities  Skin: No lesions  Access: LIJ temp HD catheter 6/1     Basic Metabolic Panel: Recent Labs  Lab 03/19/19 1930  03/21/19 0349 03/21/19 1100 03/22/19 0452 03/23/19 0423 03/24/19 0446 03/25/19 0512  NA  --    < > 140  --  141 143 145 152*  K  --    < > 4.8  --  5.1 4.9 4.9 4.4  CL  --    < > 100  --  95* 99 102 109  CO2  --    < > 21*  --  26 24 25 23   GLUCOSE  --    < > 187*  --  172* 144* 124* 136*  BUN  --    < > 113*  --  99* 141* 99* 129*  CREATININE  --    < > 8.09*  --  5.61* 6.86* 5.04* 5.60*  CALCIUM  --    < > 7.3*  --  8.0* 7.7* 8.3* 8.7*  MG  --    < > 3.1*  --  2.7* 2.5* 2.5* 2.7*  PHOS 9.0*  --   --  11.8* 9.1* 9.2*  --  8.7*   < > = values in this interval not displayed.    Liver Function Tests: Recent Labs  Lab 03/23/19 0423  03/25/19 0512  AST 40  --   ALT 60*  --   ALKPHOS 97  --   BILITOT 0.8  --   PROT 6.4*  --   ALBUMIN 3.9 3.9   No results for input(s): LIPASE, AMYLASE in the last 168 hours. No results for input(s): AMMONIA in the last 168 hours.  CBC: Recent Labs  Lab 03/19/19 0541 03/20/19 0343 03/21/19 0349 03/22/19 0452 03/23/19 0423 03/25/19 0512  WBC 21.6* 14.5* 12.6* 10.8* 14.6* 13.1*  NEUTROABS 17.6* 13.4* 11.4* 9.4*  --   --   HGB 7.9* 6.0* 6.2* 7.8* 7.5* 7.3*  HCT 23.9* 18.9* 19.0* 24.0* 22.6* 22.3*  MCV 82.4 85.5 84.1 82.5 82.2 83.5  PLT 477* 333 373 453* 394 375    Cardiac Enzymes: Recent Labs  Lab 03/24/19 1415  TROPONINI 0.06*    BNP: Invalid input(s): POCBNP  CBG: Recent Labs  Lab 03/24/19 0727 03/24/19 1127 03/24/19 1543 03/24/19 2350 03/25/19  [place]    Microbiology: Results for orders placed or performed during the hospital encounter of 03/08/19  SARS Coronavirus 2 (CEPHEID - Performed in [place] hospital lab), Hosp Order     Status: None   Collection Time: 03/08/19 11:48 PM   Specimen: Nasopharyngeal Swab  Result Value Ref Range Status   SARS Coronavirus 2 NEGATIVE NEGATIVE Final    Comment: (NOTE) If result is NEGATIVE SARS-CoV-2 target nucleic acids are NOT DETECTED. The SARS-CoV-2 RNA is generally detectable in upper and lower  respiratory specimens during the acute phase of infection. The lowest  concentration of SARS-CoV-2 viral copies this assay can detect is 250  copies / mL. A negative result does not preclude SARS-CoV-2 infection  and should not be used as the sole basis for treatment or other  patient management decisions.  A negative result may occur with  improper specimen collection / handling, submission of specimen other  than nasopharyngeal swab, presence of viral mutation(s) within the  areas targeted by this assay, and inadequate number of viral copies  (<250 copies / mL). A negative result  must be combined with clinical  observations, patient history, and epidemiological information. If result is POSITIVE SARS-CoV-2 target nucleic acids are DETECTED. The SARS-CoV-2 RNA is generally detectable in upper and lower  respiratory specimens dur ing the acute phase of infection.  Positive  results are indicative of active infection with SARS-CoV-2.  Clinical  correlation with patient history and other diagnostic information is  necessary to determine patient infection status.  Positive results do  not rule out bacterial infection or co-infection with other viruses. If result is PRESUMPTIVE POSTIVE SARS-CoV-2 nucleic acids MAY BE PRESENT.   A presumptive positive result was obtained on the submitted specimen  and confirmed on repeat testing.  While 2019 novel coronavirus  (SARS-CoV-2) nucleic acids may be present in the submitted sample  additional confirmatory testing may be necessary for epidemiological  and / or clinical management purposes  to differentiate between  SARS-CoV-2 and other Sarbecovirus currently known to infect humans.  If clinically indicated additional testing with an alternate test  methodology [id number]) is advised. The SARS-CoV-2 RNA is generally  detectable in upper and lower respiratory sp ecimens during the acute  phase of infection. The expected result is Negative. Fact Sheet for Patients:  StrictlyIdeas.no Fact Sheet for Healthcare Providers: BankingDealers.co.za This test is not yet approved or cleared by the [place] FDA and has been authorized for detection and/or diagnosis of SARS-CoV-2 by FDA under an Emergency Use Authorization (EUA).  This EUA will remain in effect (meaning this test can be used) for the duration of the COVID-19 declaration under Section 564(b)(1) of the Act, 21 U.S.C. section 360bbb-3(b)(1), unless the authorization is terminated or revoked sooner. Performed at [facility], [address] [place], [place] [address]   MRSA PCR Screening     Status: None   Collection Time: 03/09/19  2:48 AM   Specimen: Nasal Mucosa; Nasopharyngeal  Result Value Ref Range Status   MRSA by PCR NEGATIVE NEGATIVE Final    Comment:        The GeneXpert MRSA Assay (FDA approved for NASAL specimens only), is one component of a comprehensive MRSA colonization surveillance program. It is not intended to diagnose MRSA infection nor to guide or monitor treatment for MRSA infections. Performed at [facility], [address], [place], [place] [address]   Culture, respiratory (non-expectorated)  Status: None   Collection Time: 03/09/19  8:00 AM   Specimen: Tracheal Aspirate; Respiratory  Result Value Ref Range Status   Specimen Description   Final    TRACHEAL ASPIRATE Performed at [facility], [address] [place], [place] [address]    Special Requests   Final    NONE Performed at [facility], [address] [place], [place] [address]    Gram Stain   Final    ABUNDANT WBC PRESENT,BOTH PMN AND MONONUCLEAR MODERATE GRAM POSITIVE COCCI RARE YEAST    Culture   Final    MODERATE GROUP B STREP(S.AGALACTIAE)ISOLATED TESTING AGAINST S. AGALACTIAE NOT ROUTINELY PERFORMED DUE TO PREDICTABILITY OF AMP/PEN/VAN SUSCEPTIBILITY. Performed at [facility], [place] [address] [place], [place] [address]    Report Status 03/11/2019 FINAL  Final  CULTURE, BLOOD (ROUTINE X 2) w Reflex to ID Panel     Status: None   Collection Time: 03/09/19  2:53 PM   Specimen: BLOOD  Result Value Ref Range Status   Specimen Description   Final    BLOOD BLOOD RIGHT ARM Performed at [facility], [address], [place], [place] [address]    Special Requests   Final    BOTTLES DRAWN AEROBIC AND ANAEROBIC Blood Culture results may not be optimal due to an excessive volume of blood received in culture bottles Performed at [facility], [address] [place], [place] [address]    Culture   Final    NO GROWTH 5 DAYS Performed at [facility], [place] [address] [place], [place] [address]    Report Status 03/17/2019 FINAL  Final  CULTURE, BLOOD (ROUTINE X 2) w Reflex to ID Panel     Status: None   Collection Time: 03/09/19  5:00 PM   Specimen: BLOOD RIGHT ARM  Result Value Ref Range Status   Specimen Description   Final    BLOOD RIGHT ARM Performed at [facility], [address], [place], [place] [address]    Special Requests   Final    BOTTLES DRAWN AEROBIC AND ANAEROBIC Blood Culture results may not be optimal due to an excessive volume of blood received in culture bottles Performed at [facility], [address], [place], [place] [address]    Culture   Final    NO GROWTH 5 DAYS Performed at [facility], [place] [address], [place], [place] [address]    Report Status 03/15/2019 FINAL  Final  Urine Culture     Status: None   Collection Time: 03/11/19  4:33 AM   Specimen: Nasal Mucosa; Urine  Result Value Ref Range Status   Specimen Description   Final    URINE, RANDOM Performed at [facility], [address] [place], [place] [address]    Special Requests   Final    NONE Performed at [facility], [address], [place], [place] [address]    Culture   Final    NO GROWTH Performed at [facility], [place] [address] [place], [place] [address]    Report Status 03/12/2019 FINAL  Final    Coagulation Studies: No results for input(s): LABPROT, INR in the last 72 hours.  Urinalysis: No results for input(s): COLORURINE, LABSPEC, PHURINE, GLUCOSEU, HGBUR, BILIRUBINUR, KETONESUR, PROTEINUR, UROBILINOGEN, NITRITE, LEUKOCYTESUR in the last 72 hours.  Invalid input(s): APPERANCEUR    Imaging: No results found.   Medications:   . sodium chloride Stopped (03/18/19  1430)  . dexmedetomidine (PRECEDEX) IV infusion 0.4 mcg/kg/hr (03/25/19  1000)   . aspirin EC  325 mg Oral Daily  . B-complex with vitamin C  1 tablet Oral Daily  . budesonide (PULMICORT) nebulizer solution  0.5 mg Nebulization BID  . Chlorhexidine Gluconate Cloth  6 each Topical Q0600  . heparin injection (subcutaneous)  5,000 Units Subcutaneous Q8H  . insulin aspart  0-9 Units Subcutaneous Q6H  . ipratropium-albuterol  3 mL Nebulization Q4H  . mirtazapine  15 mg Oral QHS  . multivitamin with minerals  1 tablet Oral Daily  . pantoprazole  40 mg Oral QHS  . sodium chloride flush  10-40 mL Intracatheter Q12H   sodium chloride, bisacodyl, haloperidol lactate, ipratropium-albuterol, LORazepam, polyethylene glycol, sodium chloride flush  Assessment/ Plan:  Mr. Glenn Rasmussen is a 28 y.o. white male with hypertension, irritable bowel syndrome, depression with overdose. Now with acute renal failure requiring hemodialysis.   1. Acute renal failure: Nonoliguric urine output.   Creatinine on admission 1.26, normal GFR Due to uremia, hypernatremia, and worsening creatinine - will schedule hemodialysis for later today. Orders prepared.  Nonoliguric urine output.  - Hold diuretics.  - Consult vascular for permcath placement.   2. Anemia with renal failure: hemoglobin 7.3 - EPO with HD treatment MWF.   3. Acute respiratory failure with pneumonia: requiring noninvasive ventilation. Appreciate critical care input.  - pip/tazo - recommend start weaning stress dose steroids.   4. Ileus - improved. Starting diet today.    LOS: [address] 6/17/202010:24 AM

## 2019-03-25 NOTE — Progress Notes (Signed)
Pre HD Assessment    03/25/19 1405  Neurological  Level of Consciousness Alert  Orientation Level Oriented to person;Oriented to place;Disoriented to time;Disoriented to situation  Respiratory  Respiratory Pattern Regular;Labored  Bilateral Breath Sounds Clear;Diminished  Cough None  Cardiac  Pulse Regular  Heart Sounds S1, S2  ECG Monitor Yes  Cardiac Rhythm NSR  Vascular  R Radial Pulse +2  L Radial Pulse +2  Generalized Edema +1

## 2019-03-25 NOTE — Consult Note (Signed)
[facility] Consult Note  MRN : [id number]  Glenn Rasmussen is a 28 y.o. (August 21, 1991) male who presents with chief complaint of  Chief Complaint  Patient presents with  . Drug Overdose   History of Present Illness:  The patient is a 28 year old male with a past medical history of irritable bowel syndrome, hypertension and major depressive disorder who presented to the [facility] emergency department on Mar 08, 2019 after a suicide attempt.    At the time, the patient admitted taking 38 Phenergan, 19 losartan, 42 amlodipine, 78 Klonopin pills.  The patient states that he felt at that time "he did not want to go on living".  The patient had texted a friend to say "goodbye" who had then called EMS who brought him to the [facility] emergency department emergently for care.  Upon arrival to the ICU from the emergency department the patient became agitated requiring Precedex.  He had multiple episodes of emesis with overt aspiration and was emergently intubated to protect his airway.  Creatinine upon admission was 1.26 which had been increased to 3.38 in less than 24 hours.  With continued decline in renal function a temporary dialysis catheter was placed and the patient was initiated on hemodialysis.  Today's creatinine / BUN: 5.6 / 129   The patient has been admitted under the care of the critical care team for acute respiratory and renal failure.  Vascular surgery was consulted by nephrology for PermCath placement Current Facility-Administered Medications  Medication Dose Route Frequency Provider Last Rate Last Dose  . 0.9 %  sodium chloride infusion   Intravenous PRN Fritzi Mandes, MD   Stopped at 03/18/19 1430  . aspirin EC tablet 325 mg  325 mg Oral Daily Charlett Nose, RPH   325 mg at 03/25/19 1052  . bisacodyl (DULCOLAX) suppository 10 mg  10 mg Rectal Daily PRN Tukov-Yual, Magdalene S, NP      .  budesonide (PULMICORT) nebulizer solution 0.5 mg  0.5 mg Nebulization BID Flora Lipps, MD   0.5 mg at 03/25/19 0731  . Chlorhexidine Gluconate Cloth 2 % PADS 6 each  6 each Topical Q0600 Lance Coon, MD   6 each at 03/24/19 2155  . dexmedetomidine (PRECEDEX) 400 MCG/100ML (4 mcg/mL) infusion  0.4-1.2 mcg/kg/hr Intravenous Titrated [name], NP   Stopped at 03/25/19 1033  . [START ON 03/27/2019] epoetin alfa (EPOGEN) injection 10,000 Units  10,000 Units Intravenous Q M,W,F-HD Kolluru, Sarath, MD      . Derrill Memo ON 03/26/2019] feeding supplement (NEPRO CARB STEADY) liquid 237 mL  237 mL Oral BID BM Vernard Gambles L, MD      . haloperidol lactate (HALDOL) injection 2 mg  2 mg Intravenous Q6H PRN Tyler Pita, MD      . heparin injection 5,000 Units  5,000 Units Subcutaneous Q8H Tyler Pita, MD   5,000 Units at 03/25/19 0544  . insulin aspart (novoLOG) injection 0-9 Units  0-9 Units Subcutaneous Q6H Dallie Piles, RPH   3 Units at 03/25/19 1146  . ipratropium-albuterol (DUONEB) 0.5-2.5 (3) MG/3ML nebulizer solution 3 mL  3 mL Nebulization Q6H PRN Flora Lipps, MD   3 mL at 03/17/19 0818  . ipratropium-albuterol (DUONEB) 0.5-2.5 (3) MG/3ML nebulizer solution 3 mL  3 mL Nebulization Q4H Flora Lipps, MD   3 mL at 03/25/19 1105  . LORazepam (ATIVAN) injection 2-4 mg  2-4 mg Intravenous Q1H PRN Flora Lipps, MD  4 mg at 03/25/19 0307  . mirtazapine (REMERON SOL-TAB) disintegrating tablet 15 mg  15 mg Oral QHS Lavella Hammock, MD   15 mg at 03/22/19 2155  . multivitamin (RENA-VIT) tablet 1 tablet  1 tablet Oral QHS Tyler Pita, MD      . pantoprazole (PROTONIX) EC tablet 40 mg  40 mg Oral QHS Charlett Nose, RPH      . polyethylene glycol (MIRALAX / GLYCOLAX) packet 17 g  17 g Per Tube Daily PRN Flora Lipps, MD      . sodium chloride flush (NS) 0.9 % injection 10-40 mL  10-40 mL Intracatheter Q12H Darel Hong D, NP   10 mL at 03/24/19 2156  . sodium chloride flush  (NS) 0.9 % injection 10-40 mL  10-40 mL Intracatheter PRN [name], NP       Past Medical History:  Diagnosis Date  . Hypertension   . IBS (irritable bowel syndrome)    Past Surgical History:  Procedure Laterality Date  . APPENDECTOMY    . CHOLECYSTECTOMY    . TONSILLECTOMY     Social History Social History   Tobacco Use  . Smoking status: Never Smoker  . Smokeless tobacco: Never Used  Substance Use Topics  . Alcohol use: No    Frequency: Never  . Drug use: No   Family History Family History  Problem Relation Age of Onset  . Lung cancer Mother   . Lung cancer Father   Denies family history of peripheral artery disease, venous disease and renal disease.  No Known Allergies  REVIEW OF SYSTEMS (Negative unless checked)  Constitutional: [] Weight loss  [] Fever  [] Chills Cardiac: [] Chest pain   [] Chest pressure   [] Palpitations   [x] Shortness of breath when laying flat   [x] Shortness of breath at rest   [x] Shortness of breath with exertion. Vascular:  [] Pain in legs with walking   [] Pain in legs at rest   [] Pain in legs when laying flat   [] Claudication   [] Pain in feet when walking  [] Pain in feet at rest  [] Pain in feet when laying flat   [] History of DVT   [] Phlebitis   [x] Swelling in legs   [] Varicose veins   [] Non-healing ulcers Pulmonary:   [] Uses home oxygen   [] Productive cough   [] Hemoptysis   [] Wheeze  [] COPD   [] Asthma Neurologic:  [] Dizziness  [] Blackouts   [] Seizures   [] History of stroke   [] History of TIA  [] Aphasia   [] Temporary blindness   [] Dysphagia   [] Weakness or numbness in arms   [] Weakness or numbness in legs Musculoskeletal:  [] Arthritis   [] Joint swelling   [] Joint pain   [] Low back pain Hematologic:  [] Easy bruising  [] Easy bleeding   [] Hypercoagulable state   [x] Anemic  [] Hepatitis Gastrointestinal:  [] Blood in stool   [] Vomiting blood  [] Gastroesophageal reflux/heartburn   [] Difficulty swallowing. Genitourinary:  [x] Chronic kidney disease    [] Difficult urination  [] Frequent urination  [] Burning with urination   [] Blood in urine Skin:  [] Rashes   [] Ulcers   [] Wounds Psychological:  [] History of anxiety   []  History of major depression.  Physical Examination  Vitals:   03/25/19 1100 03/25/19 1107 03/25/19 1200 03/25/19 1300  BP: 127/88  132/78 138/83  Pulse: 91  91 96  Resp: (!) 34  (!) 24 (!) 24  Temp:   99.4 F (37.4 C)   TempSrc:   Axillary   SpO2: 95% 95% 96% 93%  Weight:  Height:       Body mass index is 27.1 kg/m. Gen:  WD/WN, NAD Head: Onset/AT, No temporalis wasting. Prominent temp pulse not noted. Ear/Nose/Throat: Hearing grossly intact, nares w/o erythema or drainage, oropharynx w/o Erythema/Exudate Eyes: Sclera non-icteric, conjunctiva clear Neck: Trachea midline.  No JVD.  Pulmonary:  Good air movement, respirations not labored, equal bilaterally.  Cardiac: RRR, normal S1, S2. Vascular:  Vessel Right Left  Radial Palpable Palpable  Ulnar Palpable Palpable  Brachial Palpable Palpable  Carotid Palpable, without bruit Palpable, without bruit  Aorta Not palpable N/A  Femoral Palpable Palpable  Popliteal Palpable Palpable  PT Palpable Palpable  DP Palpable Palpable   Gastrointestinal: soft, non-tender/non-distended. No guarding/reflex.  Musculoskeletal: M/S 5/5 throughout.  Extremities without ischemic changes.  No deformity or atrophy. Mild edema. Neurologic: Sensation grossly intact in extremities.  Symmetrical.  Speech is fluent. Motor exam as listed above. Psychiatric: Judgment intact, Mood & affect appropriate for pt's clinical situation. Dermatologic: No rashes or ulcers noted.  No cellulitis or open wounds. Lymph : No Cervical, Axillary, or Inguinal lymphadenopathy.  CBC Lab Results  Component Value Date   WBC 13.1 (H) 03/25/2019   HGB 7.3 (L) 03/25/2019   HCT 22.3 (L) 03/25/2019   MCV 83.5 03/25/2019   PLT 375 03/25/2019   BMET    Component Value Date/Time   NA 152 (H) 03/25/2019  0512   K 4.4 03/25/2019 0512   CL 109 03/25/2019 0512   CO2 23 03/25/2019 0512   GLUCOSE 136 (H) 03/25/2019 0512   BUN 129 (H) 03/25/2019 0512   CREATININE 5.60 (H) 03/25/2019 0512   CALCIUM 8.7 (L) 03/25/2019 0512   GFRNONAA 13 (L) 03/25/2019 0512   GFRAA 15 (L) 03/25/2019 9450   Estimated Creatinine Clearance: 18 mL/min (A) (by C-G formula based on SCr of 5.6 mg/dL (H)).  COAG Lab Results  Component Value Date   INR 1.1 03/16/2019   INR 0.9 03/08/2019   Radiology Ct Abdomen Pelvis Wo Contrast  Result Date: 03/17/2019 CLINICAL DATA:  Overdose, abdominal distension EXAM: CT ABDOMEN AND PELVIS WITHOUT CONTRAST TECHNIQUE: Multidetector CT imaging of the abdomen and pelvis was performed following the standard protocol without IV contrast. COMPARISON:  04/27/2007 FINDINGS: Lower chest: Small bilateral pleural effusions and associated atelectasis or consolidation. Hepatobiliary: No focal liver abnormality is seen. Status post cholecystectomy. No biliary dilatation. Pancreas: Unremarkable. No pancreatic ductal dilatation or surrounding inflammatory changes. Spleen: Normal in size without significant abnormality. Adrenals/Urinary Tract: Adrenal glands are unremarkable. Kidneys are normal, without renal calculi, solid lesion, or hydronephrosis. Foley catheter in the bladder Stomach/Bowel: Appendix is surgically absent. The stomach, small bowel, and colon are diffusely distended by air and fluid, with fluid present to the rectum. Vascular/Lymphatic: Right femoral central venous catheter. No enlarged abdominal or pelvic lymph nodes. Reproductive: No mass or other significant abnormality. Other: No abdominal wall hernia or abnormality. Small volume ascites. Anasarca. Musculoskeletal: No acute or significant osseous findings. IMPRESSION: 1. The stomach, small bowel, and colon are diffusely distended by air and fluid, with fluid present to the rectum. Findings are consistent with ileus. 2.  Pleural  effusions, ascites, and anasarca. 3.  Status post cholecystectomy and appendectomy. Electronically Signed   By: Eddie Candle M.D.   On: 03/17/2019 08:35   Dg Abd 1 View  Result Date: 03/21/2019 CLINICAL DATA:  Ileus follow-up. EXAM: ABDOMEN - 1 VIEW COMPARISON:  March 19, 2019 FINDINGS: Air-filled dilated loops of primarily: Remain, similar to mildly improved in the interval. No  other interval changes. An NG tube terminates in the left upper quadrant. IMPRESSION: 1. Mild improvement in ileus. 2. The NG tube terminates in the left upper quadrant. Electronically Signed   By: Dorise Bullion III M.D   On: 03/21/2019 08:14   Dg Abd 1 View  Result Date: 03/19/2019 CLINICAL DATA:  Follow-up ileus EXAM: ABDOMEN - 1 VIEW COMPARISON:  03/18/2019 FINDINGS: Gastric catheter is again noted within the stomach. Right femoral central line is noted. Diffuse gaseous distension of the colon is again seen with improved appearance of the small bowel with only a few mildly prominent loops identified. No free air is seen. No bony abnormality is noted. IMPRESSION: Improving ileus with only colonic distension on the current exam. Electronically Signed   By: Inez Catalina M.D.   On: 03/19/2019 08:06   Dg Abd 1 View  Result Date: 03/18/2019 CLINICAL DATA:  Follow-up ileus EXAM: ABDOMEN - 1 VIEW COMPARISON:  Film from the previous day. FINDINGS: Gastric catheter is noted coiled within the stomach. There remains scattered large and small bowel gas. Some progression and small-bowel dilatation is noted when compare with the previous day. Right femoral central line is seen. No bony abnormality is noted. IMPRESSION: Gaseous distension of the large and small bowel with some small bowel dilatation. This would be consistent with a generalized ileus. Correlation with the physical exam is recommended. Electronically Signed   By: Inez Catalina M.D.   On: 03/18/2019 07:31   Dg Abd 1 View  Result Date: 03/17/2019 CLINICAL DATA:  Check gastric  catheter placement EXAM: ABDOMEN - 1 VIEW COMPARISON:  None. FINDINGS: Gastric catheter is noted coiled within the stomach. Scattered large and small bowel gas is seen similar to that seen on prior CT examination. No free air is noted. IMPRESSION: Gastric catheter coiled within the stomach. Electronically Signed   By: Inez Catalina M.D.   On: 03/17/2019 10:09   Dg Abd 1 View  Result Date: 03/14/2019 CLINICAL DATA:  28 year old male with history of abdominal pain. EXAM: ABDOMEN - 1 VIEW COMPARISON:  03/09/2019. FINDINGS: Right femoral central venous catheter with tip projecting over the expected location of the proximal right common femoral vein. Gas and stool are seen scattered throughout the colon extending to the level of the distal rectum. No pathologic distension of small bowel is noted. Several nondilated gas-filled loops of small bowel or noted projecting over the central abdomen. No gross evidence of pneumoperitoneum. IMPRESSION: 1. Nonspecific, nonobstructive bowel gas pattern, as above. 2. No pneumoperitoneum. Electronically Signed   By: Vinnie Langton M.D.   On: 03/14/2019 22:34   Dg Abd 1 View  Result Date: 03/09/2019 CLINICAL DATA:  28 year old male presenting after drug overdose. EXAM: ABDOMEN - 1 VIEW COMPARISON:  Portable chest today. CT Abdomen and Pelvis 05/02/2007. FINDINGS: Semi upright AP view at 0510 hours. Enteric tube side hole is at the level of the gastric body. Nonobstructed bowel-gas pattern. No osseous abnormality identified. IMPRESSION: Enteric tube side hole at the level of the gastric body. Normal visible bowel gas pattern. Electronically Signed   By: Genevie Ann M.D.   On: 03/09/2019 05:46   Ct Head Wo Contrast  Result Date: 03/13/2019 CLINICAL DATA:  Drug overdose EXAM: CT HEAD WITHOUT CONTRAST TECHNIQUE: Contiguous axial images were obtained from the base of the skull through the vertex without intravenous contrast. COMPARISON:  Head CT 12/29/2006 FINDINGS: Brain: There is no  mass, hemorrhage or extra-axial collection. The size and configuration of the ventricles and  extra-axial CSF spaces are normal. The brain parenchyma is normal, without acute or chronic infarction. Vascular: No abnormal hyperdensity of the major intracranial arteries or dural venous sinuses. No intracranial atherosclerosis. Skull: The visualized skull base, calvarium and extracranial soft tissues are normal. Sinuses/Orbits: No fluid levels or advanced mucosal thickening of the visualized paranasal sinuses. No mastoid or middle ear effusion. The orbits are normal. IMPRESSION: Normal head CT. Electronically Signed   By: Ulyses Jarred M.D.   On: 03/13/2019 02:30   Nm Pulmonary Perf And Vent  Result Date: 03/16/2019 CLINICAL DATA:  Shortness of breath EXAM: NUCLEAR MEDICINE VENTILATION - PERFUSION LUNG SCAN VIEWS: Anterior and posterior: Ventilation and perfusion. Patient was unable to tolerate additional imaging. RADIOPHARMACEUTICALS:  32.1 mCi of Tc-68m DTPA aerosol inhalation and 4.38 mCi Tc68m MAA IV COMPARISON:  Chest radiograph March 15, 2017 FINDINGS: Ventilation: Radiotracer uptake is homogeneous and symmetric bilaterally. No evident ventilation defects. Perfusion: Radiotracer uptake is homogeneous and symmetric bilaterally. No perfusion defects evident. IMPRESSION: No ventilation or perfusion defects. Very low probability of pulmonary embolus. Electronically Signed   By: Lowella Grip III M.D.   On: 03/16/2019 13:22   Dg Chest Port 1 View  Result Date: 03/22/2019 CLINICAL DATA:  Acute respiratory failure EXAM: PORTABLE CHEST 1 VIEW COMPARISON:  Radiograph 03/22/2019 FINDINGS: Interval extubation. NG tube remains in the stomach. Central venous lines in the mid SVC unchanged. Normal cardiac silhouette. Diffuse bilateral nodular airspace disease is not improved. Increased density in the RIGHT lower lobe. No pneumothorax IMPRESSION: Is 1. No improvement in diffuse bilateral nodular airspace disease. Some  increased focality in the RIGHT lower lobe. 2. Extubation. 3. Additional support apparatus stable. Electronically Signed   By: Suzy Bouchard M.D.   On: 03/22/2019 20:01   Dg Chest Port 1 View  Result Date: 03/22/2019 CLINICAL DATA:  Acute respiratory failure. Endotracheally intubated. EXAM: PORTABLE CHEST 1 VIEW COMPARISON:  03/20/2019 FINDINGS: Support lines and tubes in appropriate position. Low lung volumes are again seen. Moderate diffuse pulmonary airspace disease shows no significant change. Heart size is stable. IMPRESSION: Moderate diffuse pulmonary airspace disease, without significant change. Electronically Signed   By: Earle Gell M.D.   On: 03/22/2019 07:34   Dg Chest Port 1 View  Result Date: 03/20/2019 CLINICAL DATA:  Central line placement EXAM: PORTABLE CHEST 1 VIEW COMPARISON:  03/19/2019 FINDINGS: There is a newly placed right IJ central venous catheter with tip projecting over the proximal SVC. The left-sided central venous catheter is stable in positioning. The endotracheal tube terminates above the carina. The enteric tube extends below the left hemidiaphragm. There are diffuse bilateral hazy airspace opacities with no significant interval improvement. No pneumothorax. The heart size is stable. IMPRESSION: 1. Newly placed right-sided central venous catheter tip terminates over the proximal SVC. There is no pneumothorax. The remaining lines and tubes are as above. 2. Otherwise, no significant interval change in appearance of the lungs. Electronically Signed   By: Constance Holster M.D.   On: 03/20/2019 01:19   Dg Chest Port 1 View  Result Date: 03/19/2019 CLINICAL DATA:  Status post intubation EXAM: PORTABLE CHEST 1 VIEW COMPARISON:  03/19/2019 FINDINGS: Endotracheal tube is now seen in satisfactory position. Gastric catheter and left jugular central line are again seen. Cardiac shadow is stable. Stable bilateral infiltrates. IMPRESSION: Endotracheal tube in satisfactory position.  The remainder of the exam is stable. Electronically Signed   By: Inez Catalina M.D.   On: 03/19/2019 08:07   Dg Chest Port 1  View  Result Date: 03/19/2019 CLINICAL DATA:  Acute respiratory failure EXAM: PORTABLE CHEST 1 VIEW COMPARISON:  03/18/2019 FINDINGS: Cardiac shadow is stable. Gastric catheter and left jugular central line are again seen and stable. Patchy bilateral infiltrates are again identified and stable given some technical variation in the film. No sizable effusion or pneumothorax is noted. No bony abnormality is seen. IMPRESSION: Stable patchy infiltrates bilaterally. Electronically Signed   By: Inez Catalina M.D.   On: 03/19/2019 08:04   Dg Chest Port 1 View  Result Date: 03/18/2019 CLINICAL DATA:  Acute respiratory failure EXAM: PORTABLE CHEST 1 VIEW COMPARISON:  03/17/2019 FINDINGS: Cardiac shadow is stable. Left jugular central line is again seen and stable. Gastric catheter is noted coiled within the stomach. Patchy infiltrates are noted bilaterally stable from the prior exam. No new focal abnormality is noted. IMPRESSION: Stable bilateral infiltrates. Electronically Signed   By: Inez Catalina M.D.   On: 03/18/2019 07:30   Dg Chest Port 1 View  Result Date: 03/17/2019 CLINICAL DATA:  Respiratory failure, distress EXAM: PORTABLE CHEST 1 VIEW COMPARISON:  03/16/2019 FINDINGS: Left central line is unchanged. NG tube placement into the stomach. Bilateral interstitial and airspace opacities are similar prior study. Mild cardiomegaly. Small bilateral effusions. No acute bony abnormality. IMPRESSION: Diffuse interstitial and airspace opacities are similar prior study. Small effusions. Electronically Signed   By: Rolm Baptise M.D.   On: 03/17/2019 10:08   Dg Chest Port 1 View  Result Date: 03/16/2019 CLINICAL DATA:  Chest pain. EXAM: PORTABLE CHEST 1 VIEW COMPARISON:  One-view chest x-ray 03/13/2019 FINDINGS: A left IJ line is stable. Heart size is exaggerated by low lung volumes. Patchy  bilateral interstitial and airspace opacities have increased. Airspace disease is asymmetric on the left. No definite effusions are present. Visualized soft tissues and bony thorax are unremarkable. IMPRESSION: 1. Increasing interstitial and airspace opacities, left greater than right. Findings are compatible with ARDS or infection. 2. Stable left IJ line. Electronically Signed   By: San Morelle M.D.   On: 03/16/2019 04:57   Dg Chest Port 1 View  Result Date: 03/13/2019 CLINICAL DATA:  Acute respiratory failure EXAM: PORTABLE CHEST 1 VIEW COMPARISON:  03/11/2019 FINDINGS: The left-sided catheter tip projects over the SVC. The heart size is stable from prior study. There is worsening vascular congestion in worsening bilateral airspace opacities. There are likely bilateral pleural effusions. No evidence of a pneumothorax. The patient has been extubated. Enteric tube has been removed. IMPRESSION: 1. Status post extubation. 2. Remaining lines and tubes as above. 3. Worsening vascular congestion. Persistent small bilateral pleural effusions. Electronically Signed   By: Constance Holster M.D.   On: 03/13/2019 03:46   Dg Chest Port 1 View  Result Date: 03/11/2019 CLINICAL DATA:  Acute respiratory failure EXAM: PORTABLE CHEST 1 VIEW COMPARISON:  03/10/2019 FINDINGS: Cardiac shadow is stable. Endotracheal tube, gastric catheter and left jugular central line are again seen and stable. Increasing vascular congestion is noted when compare with the prior exam with enlarging left pleural effusion. No focal confluent infiltrate is seen. IMPRESSION: Worsening vascular congestion with left-sided pleural effusion. Tubes and lines as described. Electronically Signed   By: Inez Catalina M.D.   On: 03/11/2019 07:07   Dg Chest Port 1 View  Result Date: 03/10/2019 CLINICAL DATA:  Hypoxia.  Patient on a ventilator. EXAM: PORTABLE CHEST 1 VIEW COMPARISON:  Earlier film, same date. FINDINGS: The endotracheal tube is in good  position, 5 cm above the carina. The NG  tube is coursing down the esophagus and into the stomach. The left IJ central venous catheter is stable. The lungs demonstrate improved aeration compared to the earlier film. Less edema and atelectasis. No pneumothorax. IMPRESSION: 1. Support apparatus in good position without complicating features. 2. Improved lung aeration since earlier chest film. Electronically Signed   By: Marijo Sanes M.D.   On: 03/10/2019 20:35   Portable Chest Xray  Result Date: 03/10/2019 CLINICAL DATA:  Respiratory failure EXAM: PORTABLE CHEST 1 VIEW COMPARISON:  03/09/2019 FINDINGS: Endotracheal tube tip at the clavicular heads. The orogastric tube at least reaches the stomach. Left IJ dialysis catheter tip at the upper cavoatrial junction. Worsening aeration with obscured left diaphragm. There is diffuse hazy lung opacity. Suspect layering pleural effusions. Cardiomegaly and vascular pedicle widening. IMPRESSION: Stable, unremarkable hardware positioning. Worsening aeration, likely atelectasis, layering fluid, and edema. Electronically Signed   By: Monte Fantasia M.D.   On: 03/10/2019 06:22   Dg Chest Port 1 View  Result Date: 03/09/2019 CLINICAL DATA:  Central line placement EXAM: PORTABLE CHEST 1 VIEW COMPARISON:  03/09/2019 FINDINGS: The endotracheal tube terminates above the carina. The newly placed left-sided central venous catheter tip projects over the SVC/distal left brachiocephalic vein. The enteric tube extends below the left hemidiaphragm. There is no left-sided pneumothorax. The heart size is stable. No right-sided pneumothorax. Coarsened lung markings are again identified bilaterally. IMPRESSION: 1. Newly placed left-sided central venous catheter as above with no evidence of a pneumothorax. 2. Remaining lines and tubes as above. 3. Stable cardiac size. Persistent coarse airspace opacities bilaterally similar to prior study. Electronically Signed   By: Constance Holster M.D.    On: 03/09/2019 19:58   Dg Chest Port 1 View  Result Date: 03/09/2019 CLINICAL DATA:  28 year old male presenting after drug overdose. EXAM: PORTABLE CHEST 1 VIEW COMPARISON:  02/09/2019 and earlier. FINDINGS: Portable AP semi upright view at 0510 hours. Intubated, endotracheal tube tip at the level the clavicles. Enteric tube courses to the abdomen, tip not included. Normal cardiac size and mediastinal contours. Patchy and confluent perihilar and lung base opacity. No superimposed pneumothorax. No overt edema. No pleural effusion identified. Paucity of bowel gas in the upper abdomen. No osseous abnormality identified. IMPRESSION: 1. Endotracheal tube tip at the level the clavicles. Enteric tube courses to the abdomen, tip not included. 2. Patchy and confluent perihilar and lung base opacity. Consider aspiration. No pulmonary edema or pleural effusion. Electronically Signed   By: Genevie Ann M.D.   On: 03/09/2019 05:45   [place] Ekg Site Rite  Result Date: 03/19/2019 If Site Rite image not attached, placement could not be confirmed due to current cardiac rhythm.  Assessment/Plan The patient is a 28 year old male with a past medical history of irritable bowel syndrome, hypertension and major depressive disorder who presented to the [facility] emergency department on Mar 08, 2019 after a suicide attempt subsequent respiratory and renal failure. 1. Renal failure: The patient has been dialyzing through a temporary dialysis catheter placed on March 09, 2019.  The patient's renal function has not shown any improvement and he will continue to need hemodialysis moving forward.  In order to be able to dialyze in the outpatient setting recommend placing a PermCath.  This will allow the patient to dialyze while planning on a more permanent dialysis access.  Procedure, risks and benefits explained to the patient.  All questions answered.  The patient wishes to proceed.  We will plan on this with either  Dew or Schnier on Thursday / Friday schedule permitting. 2. Suicide Attempt: The patient does have a past medical history for major depressive disorder.  He is currently being followed by the psychiatry team. 3.  Hypertension: On appropriate medications. Encouraged good control as its slows the progression of atherosclerotic disease.  Discussed with Dew / Schnier  Sela Hua, PA-C  03/25/2019 2:26 PM  This note was created with Dragon medical transcription system.  Any error is purely unintentional

## 2019-03-25 NOTE — Progress Notes (Deleted)
Called [name] (patient's Mother) regarding consent for placement of a PermCath for long-term hemodialysis and eventual outpatient dialysis due to renal failure. Procedure is planned for Thursday or Friday as the schedule permits with either Dr. Lucky Cowboy or Dr. Delana Meyer. Patient is unable to consent himself due to altered mental status. All questions answered regarding procedure and the consent form was filled out and placed in the patient's chart. This RN signed as witness of consent.  Cameron Ali, RN

## 2019-03-25 NOTE — Progress Notes (Signed)
Pre HD Tx    03/25/19 1410  Hand-Off documentation  Report given to (Full Name) Beatris Ship, RN   Report received from (Full Name) Darci Needle, RN   Vital Signs  Temp 99.3 F (37.4 C)  Temp Source Oral  Pulse Rate 96  Pulse Rate Source Monitor  Resp (!) 26  BP 129/81  BP Location Right Arm  BP Method Automatic  Patient Position (if appropriate) Lying  Oxygen Therapy  SpO2 95 %  O2 Device Nasal Cannula;HFNC  O2 Flow Rate (L/min) 8 L/min  Pulse Oximetry Type Continuous  Pain Assessment  Pain Scale 0-10  Pain Score 0  Dialysis Weight  Weight 78.5 kg  Type of Weight Pre-Dialysis  Time-Out for Hemodialysis  What Procedure? HD  Pt Identifiers(min of two) First/Last Name;MRN/Account#  Correct Site? Yes  Correct Side? Yes  Correct Procedure? Yes  Consents Verified? Yes  Rad Studies Available? N/A  Safety Precautions Reviewed? Yes  Engineer, civil (consulting) Number 1  Station Number 1  UF/Alarm Test Passed  Conductivity: Meter 14  Conductivity: Machine  14  pH 7.4  Reverse Osmosis Main  Normal Saline Lot Number [id number]  Dialyzer Lot Number [id number]  Disposable Set Lot Number [id number]  Machine Temperature 98.6 F (37 C)  Musician and Audible Yes  Blood Lines Intact and Secured Yes  Pre Treatment Patient Checks  Vascular access used during treatment Catheter  HD catheter dressing before treatment WDL  Hepatitis B Surface Antigen Results Negative  Hepatitis B Surface Antibody  (>10)  Hemodialysis Consent Verified Yes  Hemodialysis Standing Orders Initiated Yes  ECG (Telemetry) Monitor On Yes  Prime Ordered Normal Saline  Length of  DialysisTreatment -hour(s) 3.5 Hour(s)  Dialysis Treatment Comments Na 140  Dialyzer Elisio 17H NR  Dialysate 3K;2.5 Ca  Dialysis Anticoagulant None  Dialysate Flow Ordered 800  Blood Flow Rate Ordered 400 mL/min  Ultrafiltration Goal 4 Liters  Dialysis Blood Pressure Support Ordered Normal Saline

## 2019-03-25 NOTE — Progress Notes (Signed)
HD Tx completed tolerated well, UF goal met (4067m)   03/25/19 1723  Hand-Off documentation  Report given to (Full Name) DHeather Roberts RN   Report received from (Full Name) TBeatris Ship RN   Vital Signs  Temp 99.1 F (37.3 C)  Temp Source Oral  Pulse Rate 87  Pulse Rate Source Monitor  Resp (!) 21  BP 113/77  BP Location Right Arm  BP Method Automatic  Patient Position (if appropriate) Lying  Oxygen Therapy  SpO2 98 %  O2 Device HFNC  O2 Flow Rate (L/min) 8 L/min  Pulse Oximetry Type Continuous  Pain Assessment  Pain Scale 0-10  Pain Score 0  Dialysis Weight  Weight 74.5 kg  Type of Weight Post-Dialysis  During Hemodialysis Assessment  HD Safety Checks Performed Yes  KECN 67.9 KECN  Dialysis Fluid Bolus Normal Saline  Bolus Amount (mL) 250 mL  Intra-Hemodialysis Comments Tx completed;Tolerated well  Post-Hemodialysis Assessment  Rinseback Volume (mL) 250 mL  KECN 67.9 V  Dialyzer Clearance Clear  Duration of HD Treatment -hour(s) 3.5 hour(s)  Hemodialysis Intake (mL) 500 mL  UF Total -Machine (mL) 4500 mL  Net UF (mL) 4000 mL  Tolerated HD Treatment Yes  Hemodialysis Catheter Left Internal jugular Double-lumen  Placement Date/Time: 03/09/19 2037   Placed prior to admission: No  Time Out: Correct patient;Correct site;Correct procedure  Maximum sterile barrier precautions: Sterile gown;Mask;Cap;Large sterile sheet;Sterile gloves;Hand hygiene  Site Prep: Chlorh...  Site Condition No complications  Blue Lumen Status Heparin locked  Red Lumen Status Heparin locked  Purple Lumen Status Capped (Central line)  Post treatment catheter status Capped and Clamped

## 2019-03-25 NOTE — Plan of Care (Signed)
  Problem: Education: Goal: Knowledge of warning signs, risks, and behaviors that relate to suicide ideation and self-harm behaviors will improve Outcome: Progressing   Problem: Health Behavior/Discharge (Transition) Planning: Goal: Ability to manage health-related needs will improve Outcome: Progressing   Problem: Clinical Measurements: Goal: Remain free from any harm during hospitalization Outcome: Progressing   Problem: Nutrition: Goal: Adequate fluids and nutrition will be maintained Outcome: Progressing   Problem: Coping: Goal: Ability to disclose and discuss thoughts of suicide and self-harm will improve Outcome: Progressing   Problem: Medication Management: Goal: Adhere to prescribed medication regimen Outcome: Progressing   Problem: Sleep Hygiene: Goal: Ability to obtain adequate restful sleep will improve Outcome: Progressing   Problem: Education: Goal: Knowledge of General Education information will improve Description: Including pain rating scale, medication(s)/side effects and non-pharmacologic comfort measures Outcome: Progressing   Problem: Health Behavior/Discharge Planning: Goal: Ability to manage health-related needs will improve Outcome: Progressing   Problem: Clinical Measurements: Goal: Ability to maintain clinical measurements within normal limits will improve Outcome: Progressing Goal: Will remain free from infection Outcome: Progressing Goal: Diagnostic test results will improve Outcome: Progressing Goal: Respiratory complications will improve Outcome: Progressing Goal: Cardiovascular complication will be avoided Outcome: Progressing   Problem: Activity: Goal: Risk for activity intolerance will decrease Outcome: Progressing   Problem: Nutrition: Goal: Adequate nutrition will be maintained Outcome: Progressing   Problem: Coping: Goal: Level of anxiety will decrease Outcome: Progressing   Problem: Elimination: Goal: Will not  experience complications related to bowel motility Outcome: Progressing Goal: Will not experience complications related to urinary retention Outcome: Progressing   Problem: Pain Managment: Goal: General experience of comfort will improve Outcome: Progressing   Problem: Safety: Goal: Ability to remain free from injury will improve Outcome: Progressing   Problem: Skin Integrity: Goal: Risk for impaired skin integrity will decrease Outcome: Progressing

## 2019-03-25 NOTE — Consult Note (Signed)
[facility] Face-to-Face Psychiatry Consult follow-up  Reason for Consult: Suicide attempt by overdose Referring Physician: ICU provider Patient Identification: Glenn Rasmussen MRN:  [id number] Principal Diagnosis: Multiple drug overdose Diagnosis:  Principal Problem:   Multiple drug overdose Active Problems:   Suicide attempt ([place])   HTN (hypertension)   AKI (acute kidney injury) ([place])   Acute respiratory failure ([place])   Antihypertensive agent overdose   Calcium channel blocker overdose   MDD (major depressive disorder), recurrent severe, without psychosis ([place])   Intentional benzodiazepine overdose ([place])  Patient is seen, chart is reviewed.  Patient has provided permission to speak with mother, [name] [phone]) Collateral obtained from ICU attending. Total Time spent with patient: 25 min  Subjective: Patient on high flow oxygen.  "  They are going to let me eat something today."  HPI:  Glenn Rasmussen is a 28 y.o. male patient admitted with drug overdose and suicide attempt.  Patient presents to the ED reporting multiple drug overdose.  He states that he is depressed and "just does not care about life anymore".  He states that he took a number of medications tonight including benzodiazepine, calcium channel blocker, Phenergan, amlodipine, and "just did not care what happened".  He is currently hypotensive, though awake and alert.  Treatment for his overdose was initiated in the ED and hospitalist were called for admission. Patient has been receiving dialysis treatments.  Early this morning 03/17/2019, patient had acute respiratory distress and abdominal pain and was returned to the intensive care unit.  Patient is now receiving ventilatory support, and has an NG tube in place to suction.  Surgical consultation is pending.  Prognosis remains guarded, patient may have end-stage renal disease.  Psychiatry consult is requested for evaluation of ongoing suicidal ideation and treatment  of depression.  Past Psychiatric History: Per medication review, patient appears to be treated for ADHD, anxiety, and insomnia. Patient reports being on multiple antidepressants in the past, and states, "none of them worked for me.  They all made me feel sick to my stomach and caused me to vomit."  Risk to Self:  Yes Risk to Others:  Not known Prior Inpatient Therapy:  Not known Prior Outpatient Therapy:  Not known  On evaluation today, patient is in bed.  He is encouraged about the possibility of being able to eat today.  He is reticent to talk about the circumstances surrounding his overdose.  He states he is tolerating medication okay, however medical administration record shows patient received last dose 03/22/2019.  Patient would like to continue this medication for his depression.  Patient is denying suicidal ideation at this time.  He denies HI and AVH.   Past Medical History:  Past Medical History:  Diagnosis Date  . Hypertension   . IBS (irritable bowel syndrome)     Past Surgical History:  Procedure Laterality Date  . APPENDECTOMY    . CHOLECYSTECTOMY    . TONSILLECTOMY     Family History:  Family History  Problem Relation Age of Onset  . Lung cancer Mother   . Lung cancer Father    Family Psychiatric  History: Unknown  Social History:  Social History   Substance and Sexual Activity  Alcohol Use No  . Frequency: Never     Social History   Substance and Sexual Activity  Drug Use No    Social History   Socioeconomic History  . Marital status: Single    Spouse name: Not on file  . Number  of children: Not on file  . Years of education: Not on file  . Highest education level: Not on file  Occupational History  . Not on file  Social Needs  . Financial resource strain: Not on file  . Food insecurity    Worry: Not on file    Inability: Not on file  . Transportation needs    Medical: Not on file    Non-medical: Not on file  Tobacco Use  . Smoking  status: Never Smoker  . Smokeless tobacco: Never Used  Substance and Sexual Activity  . Alcohol use: No    Frequency: Never  . Drug use: No  . Sexual activity: Not on file  Lifestyle  . Physical activity    Days per week: Not on file    Minutes per session: Not on file  . Stress: Not on file  Relationships  . Social Herbalist on phone: Not on file    Gets together: Not on file    Attends religious service: Not on file    Active member of club or organization: Not on file    Attends meetings of clubs or organizations: Not on file    Relationship status: Not on file  Other Topics Concern  . Not on file  Social History Narrative  . Not on file   Additional Social History:    Patient lives alone.  He reports his plan for discharge is to return to living independently.  Allergies:  No Known Allergies  Labs:  Results for orders placed or performed during the hospital encounter of 03/08/19 (from the past 48 hour(s))  Glucose, capillary     Status: Abnormal   Collection Time: 03/23/19 12:38 PM  Result Value Ref Range   Glucose-Capillary 108 (H) 70 - 99 mg/dL   Comment 1 Notify RN   Glucose, capillary     Status: Abnormal   Collection Time: 03/23/19  5:09 PM  Result Value Ref Range   Glucose-Capillary 123 (H) 70 - 99 mg/dL   Comment 1 Notify RN   Glucose, capillary     Status: Abnormal   Collection Time: 03/23/19 11:19 PM  Result Value Ref Range   Glucose-Capillary 103 (H) 70 - 99 mg/dL  Magnesium     Status: Abnormal   Collection Time: 03/24/19  4:46 AM  Result Value Ref Range   Magnesium 2.5 (H) 1.7 - 2.4 mg/dL    Comment: Performed at [facility], [address], [place] [address]  Basic metabolic panel     Status: Abnormal   Collection Time: 03/24/19  4:46 AM  Result Value Ref Range   Sodium 145 135 - 145 mmol/L   Potassium 4.9 3.5 - 5.1 mmol/L   Chloride 102 98 - 111 mmol/L   CO2 25 22 - 32 mmol/L   Glucose, Bld 124 (H) 70 - 99  mg/dL   BUN 99 (H) 6 - 20 mg/dL    Comment: RESULT CONFIRMED BY MANUAL DILUTION SDR   Creatinine, Ser 5.04 (H) 0.61 - 1.24 mg/dL   Calcium 8.3 (L) 8.9 - 10.3 mg/dL   GFR calc non Af Amer 14 (L) >60 mL/min   GFR calc Af Amer 16 (L) >60 mL/min   Anion gap 18 (H) 5 - 15    Comment: Performed at [facility], [address] [place], [place] [address]  Glucose, capillary     Status: Abnormal   Collection Time: 03/24/19  5:48 AM  Result Value Ref Range   Glucose-Capillary 105 (H) 70 - 99 mg/dL  Glucose, capillary     Status: Abnormal   Collection Time: 03/24/19  7:27 AM  Result Value Ref Range   Glucose-Capillary 117 (H) 70 - 99 mg/dL  Glucose, capillary     Status: Abnormal   Collection Time: 03/24/19 11:27 AM  Result Value Ref Range   Glucose-Capillary 108 (H) 70 - 99 mg/dL  Troponin I - ONCE - STAT     Status: Abnormal   Collection Time: 03/24/19  2:15 PM  Result Value Ref Range   Troponin I 0.06 (HH) <0.03 ng/mL    Comment: CRITICAL VALUE NOTED. VALUE IS CONSISTENT WITH PREVIOUSLY REPORTED/CALLED VALUE...[facility] Performed at [facility], [address] [place], [place] [address]   Glucose, capillary     Status: Abnormal   Collection Time: 03/24/19  3:43 PM  Result Value Ref Range   Glucose-Capillary 115 (H) 70 - 99 mg/dL  Glucose, capillary     Status: Abnormal   Collection Time: 03/24/19 11:50 PM  Result Value Ref Range   Glucose-Capillary 110 (H) 70 - 99 mg/dL   Comment 1 Notify RN   Magnesium     Status: Abnormal   Collection Time: 03/25/19  5:12 AM  Result Value Ref Range   Magnesium 2.7 (H) 1.7 - 2.4 mg/dL    Comment: Performed at [facility], [address], [place], [place] [address]  Renal function panel     Status: Abnormal   Collection Time: 03/25/19  5:12 AM  Result Value Ref Range   Sodium 152 (H) 135 - 145 mmol/L   Potassium 4.4 3.5 - 5.1 mmol/L   Chloride 109 98 - 111 mmol/L   CO2 23 22 - 32 mmol/L   Glucose, Bld 136 (H) 70 -  99 mg/dL   BUN 129 (H) 6 - 20 mg/dL    Comment: RESULT CONFIRMED BY MANUAL DILUTION. QSD   Creatinine, Ser 5.60 (H) 0.61 - 1.24 mg/dL   Calcium 8.7 (L) 8.9 - 10.3 mg/dL   Phosphorus 8.7 (H) 2.5 - 4.6 mg/dL   Albumin 3.9 3.5 - 5.0 g/dL   GFR calc non Af Amer 13 (L) >60 mL/min   GFR calc Af Amer 15 (L) >60 mL/min   Anion gap 20 (H) 5 - 15    Comment: Performed at [facility], [address] [place], [place] [address]  CBC     Status: Abnormal   Collection Time: 03/25/19  5:12 AM  Result Value Ref Range   WBC 13.1 (H) 4.0 - 10.5 K/uL   RBC 2.67 (L) 4.22 - 5.81 MIL/uL   Hemoglobin 7.3 (L) 13.0 - 17.0 g/dL   HCT 22.3 (L) 39.0 - 52.0 %   MCV 83.5 80.0 - 100.0 fL   MCH 27.3 26.0 - 34.0 pg   MCHC 32.7 30.0 - 36.0 g/dL   RDW 13.3 11.5 - 15.5 %   Platelets 375 150 - 400 K/uL   nRBC 0.0 0.0 - 0.2 %    Comment: Performed at [facility], [address] [place], [place] [address]  Glucose, capillary     Status: Abnormal   Collection Time: 03/25/19  5:36 AM  Result Value Ref Range   Glucose-Capillary 124 (H) 70 - 99 mg/dL    Current Facility-Administered Medications  Medication Dose Route Frequency Provider Last Rate Last Dose  . 0.9 %  sodium chloride infusion   Intravenous PRN Fritzi Mandes, MD  Stopped at 03/18/19 1430  . aspirin EC tablet 325 mg  325 mg Oral Daily Charlett Nose, RPH      . B-complex with vitamin C tablet 1 tablet  1 tablet Oral Daily Charlett Nose, RPH      . bisacodyl (DULCOLAX) suppository 10 mg  10 mg Rectal Daily PRN Tukov-Yual, Magdalene S, NP      . budesonide (PULMICORT) nebulizer solution 0.5 mg  0.5 mg Nebulization BID Flora Lipps, MD   0.5 mg at 03/25/19 0731  . Chlorhexidine Gluconate Cloth 2 % PADS 6 each  6 each Topical Q0600 Lance Coon, MD   6 each at 03/24/19 2155  . dexmedetomidine (PRECEDEX) 400 MCG/100ML (4 mcg/mL) infusion  0.4-1.2 mcg/kg/hr Intravenous Titrated Darel Hong D, NP 7.85 mL/hr at 03/25/19 1000 0.4  mcg/kg/hr at 03/25/19 1000  . haloperidol lactate (HALDOL) injection 2 mg  2 mg Intravenous Q6H PRN Tyler Pita, MD      . heparin injection 5,000 Units  5,000 Units Subcutaneous Q8H Tyler Pita, MD   5,000 Units at 03/25/19 0544  . insulin aspart (novoLOG) injection 0-9 Units  0-9 Units Subcutaneous Q6H Dallie Piles, RPH   1 Units at 03/25/19 0545  . ipratropium-albuterol (DUONEB) 0.5-2.5 (3) MG/3ML nebulizer solution 3 mL  3 mL Nebulization Q6H PRN Flora Lipps, MD   3 mL at 03/17/19 0818  . ipratropium-albuterol (DUONEB) 0.5-2.5 (3) MG/3ML nebulizer solution 3 mL  3 mL Nebulization Q4H Flora Lipps, MD   3 mL at 03/25/19 0731  . LORazepam (ATIVAN) injection 2-4 mg  2-4 mg Intravenous Q1H PRN Flora Lipps, MD   4 mg at 03/25/19 0307  . mirtazapine (REMERON SOL-TAB) disintegrating tablet 15 mg  15 mg Oral QHS Lavella Hammock, MD   15 mg at 03/22/19 2155  . multivitamin with minerals tablet 1 tablet  1 tablet Oral Daily Charlett Nose, RPH      . pantoprazole (PROTONIX) EC tablet 40 mg  40 mg Oral QHS Charlett Nose, RPH      . polyethylene glycol (MIRALAX / GLYCOLAX) packet 17 g  17 g Per Tube Daily PRN Flora Lipps, MD      . sodium chloride flush (NS) 0.9 % injection 10-40 mL  10-40 mL Intracatheter Q12H Darel Hong D, NP   10 mL at 03/24/19 2156  . sodium chloride flush (NS) 0.9 % injection 10-40 mL  10-40 mL Intracatheter PRN [name], NP        Musculoskeletal: Strength & Muscle Tone: decreased Gait & Station: Not assessed Patient leans: N/A  Psychiatric Specialty Exam: Physical Exam  Nursing note and vitals reviewed. Constitutional: He is oriented to person, place, and time. He appears well-developed and well-nourished. No distress.  HENT:  Head: Normocephalic and atraumatic.  Eyes: EOM are normal.  Neck: Normal range of motion.  Cardiovascular: Regular rhythm.  Tachycardia  Respiratory:  Labored breathing on high flow oxygen via nasal  cannula  Musculoskeletal: Normal range of motion.  Neurological: He is alert and oriented to person, place, and time.    Review of Systems  Constitutional: Positive for malaise/fatigue.  Respiratory: Positive for shortness of breath.   Cardiovascular: Negative.  Negative for chest pain and palpitations.  Gastrointestinal: Positive for abdominal pain, constipation and nausea.  Musculoskeletal: Positive for myalgias.  Neurological: Positive for weakness.  Psychiatric/Behavioral: Positive for depression. Negative for hallucinations, substance abuse and suicidal ideas. The patient is nervous/anxious. The patient does not have  insomnia.     Blood pressure 128/81, pulse 85, temperature 98.8 F (37.1 C), temperature source Axillary, resp. rate (!) 33, height 5' 7.01" (1.702 m), weight 78.5 kg, SpO2 96 %.Body mass index is 27.1 kg/m.  General Appearance: Fairly Groomed  Eye Contact:  Good  Speech:  Clear and Coherent  Volume:  Decreased  Mood:  Anxious and Depressed  Affect:  Depressed and Tearful  Thought Process:  Coherent  Orientation:  Full (Time, Place, and Person)  Thought Content:  Hallucinations: None  Suicidal Thoughts:  Denies currently, following suicide attempt by overdose  Homicidal Thoughts:  No  Memory:  NA  Judgement:  Poor  Insight:  Present and Shallow  Psychomotor Activity:  Decreased  Concentration:  Concentration: Fair  Recall:  [place] of Knowledge:  Good  Language:  Fair  Akathisia:  No  Handed:  Right  AIMS (if indicated):     Assets:  Communication Skills Desire for Improvement Social Support patient has given consent to talk to mother, [name] [phone]  ADL's:  Impaired  Cognition:  WNL  Sleep:   decreased     Treatment Plan Summary: Daily contact with patient to assess and evaluate symptoms and progress in treatment and Medication management  Continue Remeron SolTab 15 mg at bedtime for depression and anxiety.  Disposition: Recommend  psychiatric Inpatient admission when medically cleared. Supportive therapy provided about ongoing stressors.  patient has given consent to talk to mother, [name] [phone]  Psychiatry will continue to follow and advise medication management as indicated.  Lavella Hammock, MD 03/25/2019 10:43 AM

## 2019-03-25 NOTE — Progress Notes (Signed)
Nutrition Follow-up  DOCUMENTATION CODES:   Obesity unspecified  INTERVENTION:  Provide Nepro Shake po BID, each supplement provides 425 kcal and 19 grams protein.  Provide Rena-vite QHS.  NUTRITION DIAGNOSIS:   Inadequate oral intake related to inability to eat as evidenced by NPO status.  Resolving - diet advanced.  GOAL:   Patient will meet greater than or equal to 90% of their needs  Progressing.  MONITOR:   PO intake, Supplement acceptance, Labs, Weight trends, I & O's  REASON FOR ASSESSMENT:   Ventilator    ASSESSMENT:   28 year old male with PMHx of IBS, HTN who was admitted on 5/31 for multiple drug overdose, required intubation 5/31-6/4 and required CRRT, transferred out of ICU on 6/7 and now transferred back go ICU on 6/11 after developing acute pulmonary edema requiring emergent intubation.   Patient was extubated on 6/14. Required BiPAP intermittently but now able to be off BiPAP during the day. Following SLP evaluation today diet was advanced to dysphagia 2 with thin liquids. Plan for HD today. Plan for permcath placement.   Medications reviewed and include: B-complex with C, Epogen 10000 units with HD, Novolog 0-9 units Q6hrs, Remeron 15 mg QHS, MVI daily, pantoprazole, Precedex gtt.  Labs reviewed: CBG 110-207, Sodium 152, BUN 129, Creatinine 5/6, Phosphorus 8.7, Magnesium 2.7.  I/O: 3535 mL UOP yesterday (1.9 mL/kg/hr)  Weight trend: 78.5 kg on 6/17; -8.9 kg from yesterday which is not likely (weight from today probably entered incorrectly)  Diet Order:   Diet Order            Diet NPO time specified  Diet effective midnight        DIET DYS 2 Room service appropriate? Yes with Assist; Fluid consistency: Thin  Diet effective now             EDUCATION NEEDS:   No education needs have been identified at this time  Skin:  Skin Assessment: Reviewed RN Assessment  Last BM:  03/25/2019 - medium type 7  Height:   Ht Readings from Last 1  Encounters:  03/21/19 5' 7.01" (1.702 m)   Weight:   Wt Readings from Last 1 Encounters:  03/25/19 78.5 kg   Ideal Body Weight:  67.3 kg  BMI:  Body mass index is 27.1 kg/m.  Estimated Nutritional Needs:   Kcal:  2200-2400  Protein:  110-120 grams  Fluid:  UOP + 1 L  Willey Blade, MS, RD, LDN Office: [phone] Pager: [phone] After Hours/Weekend Pager: [phone]

## 2019-03-25 NOTE — Evaluation (Signed)
Clinical/Bedside Swallow Evaluation Patient Details  Name: Glenn Rasmussen MRN: [id number] Date of Birth: 1991-02-11  Today's Date: 03/25/2019 Time: SLP Start Time (ACUTE ONLY): 41 SLP Stop Time (ACUTE ONLY): 1145 SLP Time Calculation (min) (ACUTE ONLY): 55 min  Past Medical History:  Past Medical History:  Diagnosis Date  . Hypertension   . IBS (irritable bowel syndrome)    Past Surgical History:  Past Surgical History:  Procedure Laterality Date  . APPENDECTOMY    . CHOLECYSTECTOMY    . TONSILLECTOMY     HPI:  Pt is a 28 y.o. male who presents with chief complaint as above.  Patient presents to the ED reporting multiple drug overdose.  He states that he is depressed and "just does not care about life anymore".  He states that he took a number of medications tonight including benzodiazepine, calcium channel blocker, Phenergan, amlodipine, and "just did not care what happened".  He is currently hypotensive, though awake and alert.  Treatment for his overdose was initiated in the ED and hospitalist were called for admission."  The patient was intubated 03/09/2019 AM and extubated 03/12/2019 AM.  Pt was readmitted to the ICU on 03/17/2019 and orally intubated again from 03/19/2019-03/22/2019. He has tolerated extubation and currently on [place] O2 support; also HD.   Assessment / Plan / Recommendation Clinical Impression  Pt appears to present w/ grossly adequate oropharyngeal phase function of swallowing w/ trial consistencies assessed; min oral phase deficits noted c/b min incrased time/effort for full mastication of soft solids. Suspect impacted by pt's declined Pulmonary status and overall strength/stamina s/p oral intubations and hospitalization. Pt required Min verbal cues for follow through w/ all tasks. Pt positioned upright in bed and given trials of ice chips, thin liquids and purees/soft solids w/ no immediate, overt s/s of aspiration noted; clear vocal quality followed and no decline in O2  sats though min increased respiratory effort noted during/post trials w/ the exertion of tasks. Pt helped to hold cup when drinking - No straws utilized. Oral phase was c/b min increased mastication time/effort w/ trials of soft solids; he cleared orally given time. No deficits were noted w/ trials of thin liquids or purees; no oral residue or bolus loss noted. Pt helped to feed self by holding Cup but needed full supervision. OM exam during bolus management appeared grossly WFL; no unilateral weakness noted during individual movements. Pt exhibited min declined Cognitive attention during tasks and required cueing for full follow through. Recommend a Dysphagia level 2(minced foods) diet w/ thin liquids; aspiration precautions; Pills in Puree - Whole vs Crushed as needed for safer swallowing. Full feeding Supervision and assistance at meals d/t Cognitive status and decreased strength. ST services will continue to monitor pt's status and toleration of diet and provide trials to upgrade diet when appropriate. Dietician f/u recommended. MD updated.  SLP Visit Diagnosis: Dysphagia, unspecified (R13.10)    Aspiration Risk  Mild aspiration risk(but reduced following precautions)    Diet Recommendation  Dysphagia level 2 (MINCED foods) w/ thin liquids; NO STRAWS.  Aspiration precautions; feeding assistance and Supervision at meals  Medication Administration: Whole meds with puree    Other  Recommendations Recommended Consults: (Dietician f/u) Oral Care Recommendations: Oral care BID;Staff/trained caregiver to provide oral care Other Recommendations: (n/a)   Follow up Recommendations None(TBD)      Frequency and Duration min 2x/week  1 week       Prognosis Prognosis for Safe Diet Advancement: Good Barriers to Reach Goals: Cognitive deficits;Severity  of deficits(Medical and respiratory status; Mental status)      Swallow Study   General Date of Onset: 03/08/19(in ED) HPI: Pt is a 28 y.o. male who  presents with chief complaint as above.  Patient presents to the ED reporting multiple drug overdose.  He states that he is depressed and "just does not care about life anymore".  He states that he took a number of medications tonight including benzodiazepine, calcium channel blocker, Phenergan, amlodipine, and "just did not care what happened".  He is currently hypotensive, though awake and alert.  Treatment for his overdose was initiated in the ED and hospitalist were called for admission."  The patient was intubated 03/09/2019 AM and extubated 03/12/2019 AM.  Pt was readmitted to the ICU on 03/17/2019 and orally intubated again from 03/19/2019-03/22/2019. He has tolerated extubation and currently on [place] O2 support; also HD. Type of Study: Bedside Swallow Evaluation Previous Swallow Assessment: 03/13/2019 Diet Prior to this Study: Thin liquids;Dysphagia 2 (chopped) Temperature Spikes Noted: No(wbc 13.1) Respiratory Status: Nasal cannula(8 liters) History of Recent Intubation: Yes Length of Intubations (days): (3 days, then 4 days) Date extubated: 03/22/19 Behavior/Cognition: Alert;Cooperative;Pleasant mood;Distractible;Requires cueing Oral Cavity Assessment: Within Functional Limits Oral Care Completed by SLP: Recent completion by staff Oral Cavity - Dentition: Adequate natural dentition Vision: Functional for self-feeding Self-Feeding Abilities: Able to feed self;Needs assist;Needs set up;Total assist(overall weakness) Patient Positioning: Upright in bed(needed positioning) Baseline Vocal Quality: Normal;Low vocal intensity Volitional Cough: Strong Volitional Swallow: Able to elicit    Oral/Motor/Sensory Function Overall Oral Motor/Sensory Function: Within functional limits   Ice Chips Ice chips: Within functional limits Presentation: Spoon(fed; 3 trials)   Thin Liquid Thin Liquid: Within functional limits Presentation: Cup;Self Fed(~3 ozs total) Other Comments: water, then juice    Nectar Thick  Nectar Thick Liquid: Not tested   Honey Thick Honey Thick Liquid: Not tested   Puree Puree: Within functional limits Presentation: Spoon(fed; ~3 ozs)   Solid     Solid: Impaired(soft solid) Presentation: Spoon(fed; 5 trials) Oral Phase Impairments: Impaired mastication(min increased time) Oral Phase Functional Implications: Impaired mastication(mi increased time) Pharyngeal Phase Impairments: (none)       Orinda Kenner, MS, CCC-SLP Anwar Sakata 03/25/2019,4:48 PM

## 2019-03-25 NOTE — Progress Notes (Signed)
Post HD Assessment    03/25/19 1730  Neurological  Level of Consciousness Alert  Orientation Level Oriented to person;Oriented to place;Disoriented to time;Disoriented to situation  Respiratory  Respiratory Pattern Regular;Labored  Bilateral Breath Sounds Clear;Diminished  Cough None  Cardiac  Pulse Regular  Heart Sounds S1, S2  ECG Monitor Yes  Cardiac Rhythm NSR  Vascular  R Radial Pulse +2  L Radial Pulse +2  Generalized Edema +1  Psychosocial  Psychosocial (WDL) WDL  Patient Behaviors Cooperative;Calm

## 2019-03-25 NOTE — Progress Notes (Signed)
Follow up - Critical Care Medicine Note  Patient Details:    Glenn Rasmussen is an 28 y.o. male.admitted with overdose on benzodiazepine, ARB, calcium channel blocker, and Phenergan.  Required intubation and hemodialysis for respiratory failure and renal failure.  Subsequently transferred out of the ICU.    Readmitted to ICU  due to respiratory distress.  Lines, Airways, Drains: CVC Triple Lumen 03/09/19 Right Femoral (Active)  Indication for Insertion or Continuance of Line Prolonged intravenous therapies 03/17/2019 12:00 PM  Site Assessment Clean;Dry;Intact 03/17/2019 12:00 PM  Proximal Lumen Status Flushed;Saline locked;Blood return noted 03/17/2019 12:00 PM  Medial Lumen Status Flushed;Saline locked;Blood return noted 03/17/2019 12:00 PM  Distal Lumen Status Flushed;Saline locked;Blood return noted 03/17/2019 12:00 PM  Dressing Type Transparent;Occlusive 03/17/2019 12:00 PM  Dressing Status Clean;Dry;Intact 03/17/2019 12:00 PM  Line Care Connections checked and tightened 03/17/2019 12:00 PM  Dressing Intervention Other (Comment) 03/17/2019 12:00 PM  Dressing Change Due 03/20/19 03/17/2019 12:00 PM     NG/OG Tube Nasogastric Left nare Documented cm marking at nare/ corner of mouth 75 cm (Active)  Cm Marking at Nare/Corner of Mouth (if applicable) 75 cm 05/08/1913  4:00 PM  Site Assessment Clean;Dry;Intact 03/17/2019  4:00 PM  Ongoing Placement Verification No change in cm markings or external length of tube from initial placement;No change in respiratory status;No acute changes, not attributed to clinical condition 03/17/2019  4:00 PM  Status Suction-low intermittent 03/17/2019  4:00 PM  Drainage Appearance Brown 03/17/2019  4:00 PM  Output (mL) 50 mL 03/17/2019  4:00 PM     Urethral Catheter Wells Guiles RN Non-latex 14 Fr. (Active)  Indication for Insertion or Continuance of Catheter Therapy based on hourly urine output monitoring and documentation for critical condition (NOT STRICT I&O) 03/17/2019  4:00 PM  Site  Assessment Clean;Intact;Dry 03/17/2019  4:00 PM  Catheter Maintenance Bag below level of bladder;Catheter secured;Drainage bag/tubing not touching floor;No dependent loops;Insertion date on drainage bag;Seal intact 03/17/2019  4:00 PM  Collection Container Standard drainage bag 03/17/2019  4:00 PM  Securement Method Leg strap 03/17/2019  4:00 PM  Urinary Catheter Interventions Unclamped 03/17/2019  4:00 PM  Output (mL) 100 mL 03/17/2019  4:00 PM    Anti-infectives:  Anti-infectives (From admission, onward)   Start     Dose/Rate Route Frequency Ordered Stop   03/19/19 1400  piperacillin-tazobactam (ZOSYN) IVPB 3.375 g     3.375 g 12.5 mL/hr over 240 Minutes Intravenous Every 12 hours 03/19/19 1353 03/25/19 0900   03/19/19 0830  erythromycin 500 mg in sodium chloride 0.9 % 100 mL IVPB     500 mg 100 mL/hr over 60 Minutes Intravenous Every 8 hours 03/19/19 0824 03/22/19 0259   03/18/19 0200  Ampicillin-Sulbactam (UNASYN) 3 g in sodium chloride 0.9 % 100 mL IVPB  Status:  Discontinued     3 g 200 mL/hr over 30 Minutes Intravenous Every 12 hours 03/18/19 0044 03/19/19 1353   03/17/19 2100  metroNIDAZOLE (FLAGYL) IVPB 500 mg  Status:  Discontinued     500 mg 100 mL/hr over 60 Minutes Intravenous Every 8 hours 03/17/19 2048 03/18/19 0031   03/11/19 1800  cefTRIAXone (ROCEPHIN) 2 g in sodium chloride 0.9 % 100 mL IVPB  Status:  Discontinued     2 g 200 mL/hr over 30 Minutes Intravenous Every 24 hours 03/11/19 1243 03/18/19 0048   03/11/19 1200  vancomycin (VANCOCIN) 1,500 mg in sodium chloride 0.9 % 500 mL IVPB  Status:  Discontinued     1,500 mg 250  mL/hr over 120 Minutes Intravenous  Once 03/11/19 1020 03/11/19 1032   03/11/19 1200  vancomycin (VANCOCIN) 1,500 mg in sodium chloride 0.9 % 500 mL IVPB     1,500 mg 250 mL/hr over 120 Minutes Intravenous  Once 03/11/19 1030 03/11/19 1333   03/10/19 1200  vancomycin (VANCOCIN) 1,500 mg in sodium chloride 0.9 % 500 mL IVPB  Status:  Discontinued     1,500  mg 250 mL/hr over 120 Minutes Intravenous  Once 03/10/19 1055 03/10/19 1114   03/10/19 1200  vancomycin (VANCOCIN) 1,500 mg in sodium chloride 0.9 % 500 mL IVPB     1,500 mg 250 mL/hr over 120 Minutes Intravenous  Once 03/10/19 1114 03/10/19 1345   03/09/19 0515  piperacillin-tazobactam (ZOSYN) IVPB 3.375 g  Status:  Discontinued     3.375 g 12.5 mL/hr over 240 Minutes Intravenous Every 8 hours 03/09/19 0507 03/11/19 1243      Microbiology: Results for orders placed or performed during the hospital encounter of 03/08/19  SARS Coronavirus 2 (CEPHEID - Performed in [place] hospital lab), Hosp Order     Status: None   Collection Time: 03/08/19 11:48 PM   Specimen: Nasopharyngeal Swab  Result Value Ref Range Status   SARS Coronavirus 2 NEGATIVE NEGATIVE Final    Comment: (NOTE) If result is NEGATIVE SARS-CoV-2 target nucleic acids are NOT DETECTED. The SARS-CoV-2 RNA is generally detectable in upper and lower  respiratory specimens during the acute phase of infection. The lowest  concentration of SARS-CoV-2 viral copies this assay can detect is 250  copies / mL. A negative result does not preclude SARS-CoV-2 infection  and should not be used as the sole basis for treatment or other  patient management decisions.  A negative result may occur with  improper specimen collection / handling, submission of specimen other  than nasopharyngeal swab, presence of viral mutation(s) within the  areas targeted by this assay, and inadequate number of viral copies  (<250 copies / mL). A negative result must be combined with clinical  observations, patient history, and epidemiological information. If result is POSITIVE SARS-CoV-2 target nucleic acids are DETECTED. The SARS-CoV-2 RNA is generally detectable in upper and lower  respiratory specimens dur ing the acute phase of infection.  Positive  results are indicative of active infection with SARS-CoV-2.  Clinical  correlation with patient  history and other diagnostic information is  necessary to determine patient infection status.  Positive results do  not rule out bacterial infection or co-infection with other viruses. If result is PRESUMPTIVE POSTIVE SARS-CoV-2 nucleic acids MAY BE PRESENT.   A presumptive positive result was obtained on the submitted specimen  and confirmed on repeat testing.  While 2019 novel coronavirus  (SARS-CoV-2) nucleic acids may be present in the submitted sample  additional confirmatory testing may be necessary for epidemiological  and / or clinical management purposes  to differentiate between  SARS-CoV-2 and other Sarbecovirus currently known to infect humans.  If clinically indicated additional testing with an alternate test  methodology [phone]) is advised. The SARS-CoV-2 RNA is generally  detectable in upper and lower respiratory sp ecimens during the acute  phase of infection. The expected result is Negative. Fact Sheet for Patients:  StrictlyIdeas.no Fact Sheet for Healthcare Providers: BankingDealers.co.za This test is not yet approved or cleared by the [place] FDA and has been authorized for detection and/or diagnosis of SARS-CoV-2 by FDA under an Emergency Use Authorization (EUA).  This EUA will remain in effect (meaning this  test can be used) for the duration of the COVID-19 declaration under Section 564(b)(1) of the Act, 21 U.S.C. section 360bbb-3(b)(1), unless the authorization is terminated or revoked sooner. Performed at [facility], [address] [place], [place] [address]   MRSA PCR Screening     Status: None   Collection Time: 03/09/19  2:48 AM   Specimen: Nasal Mucosa; Nasopharyngeal  Result Value Ref Range Status   MRSA by PCR NEGATIVE NEGATIVE Final    Comment:        The GeneXpert MRSA Assay (FDA approved for NASAL specimens only), is one component of a comprehensive MRSA  colonization surveillance program. It is not intended to diagnose MRSA infection nor to guide or monitor treatment for MRSA infections. Performed at [facility], [address] [place], [place] [address]   Culture, respiratory (non-expectorated)     Status: None   Collection Time: 03/09/19  8:00 AM   Specimen: Tracheal Aspirate; Respiratory  Result Value Ref Range Status   Specimen Description   Final    TRACHEAL ASPIRATE Performed at [facility], [address] [place], [place] [address]    Special Requests   Final    NONE Performed at [facility], [address] [place], [place] [address]    Gram Stain   Final    ABUNDANT WBC PRESENT,BOTH PMN AND MONONUCLEAR MODERATE GRAM POSITIVE COCCI RARE YEAST    Culture   Final    MODERATE GROUP B STREP(S.AGALACTIAE)ISOLATED TESTING AGAINST S. AGALACTIAE NOT ROUTINELY PERFORMED DUE TO PREDICTABILITY OF AMP/PEN/VAN SUSCEPTIBILITY. Performed at [facility], [place] [address] [place], [place] [address]    Report Status 03/11/2019 FINAL  Final  CULTURE, BLOOD (ROUTINE X 2) w Reflex to ID Panel     Status: None   Collection Time: 03/09/19  2:53 PM   Specimen: BLOOD  Result Value Ref Range Status   Specimen Description   Final    BLOOD BLOOD RIGHT ARM Performed at [facility], [address] [place], [place] [address]    Special Requests   Final    BOTTLES DRAWN AEROBIC AND ANAEROBIC Blood Culture results may not be optimal due to an excessive volume of blood received in culture bottles Performed at [facility], [address], [place], [place] [address]    Culture   Final    NO GROWTH 5 DAYS Performed at [facility], [place] [address] [place], [place] [address]    Report Status 03/17/2019 FINAL  Final  CULTURE, BLOOD (ROUTINE X 2) w Reflex to ID Panel     Status: None   Collection Time: 03/09/19  5:00 PM   Specimen: BLOOD RIGHT ARM  Result Value Ref Range Status    Specimen Description   Final    BLOOD RIGHT ARM Performed at [facility], [address] [place], [place] [address]    Special Requests   Final    BOTTLES DRAWN AEROBIC AND ANAEROBIC Blood Culture results may not be optimal due to an excessive volume of blood received in culture bottles Performed at [facility], [address], [place], [place] [address]    Culture   Final    NO GROWTH 5 DAYS Performed at [facility], [place] [address] [place], [place] [address]    Report Status 03/15/2019 FINAL  Final  Urine Culture     Status: None   Collection Time: 03/11/19  4:33 AM   Specimen:  Nasal Mucosa; Urine  Result Value Ref Range Status   Specimen Description   Final    URINE, RANDOM Performed at [facility], [address], [place], [place] [address]    Special Requests   Final    NONE Performed at [facility], [address], [place], [place] [address]    Culture   Final    NO GROWTH Performed at [facility], [place] [address], [place], [place] [address]    Report Status 03/12/2019 FINAL  Final    Best Practice/Protocols:  VTE Prophylaxis: Heparin (SQ)   Events: 5/31 admitted for severe shock and resp failure DRUG OD 6/1 started CRRT multiorgan failure, resp failure, vasopressors 6/2 severe hypoxia UF rates increased to 500 6/3 DNR, multiorgan failure 6/4 CODE STATUS changed to full code,high risk for reintubation severe hypoxia on CRRT 6/5 high flow [place], on CRRT, high risk for intubation 6/7 resp failure resolved, off CRRT, transfer to gen med floor 6/9 readmitted to ICU due to respiratory distress, abdominal distention due to ileus. 6/11 reintubated due to acute pulmonary edema from volume overload. 6/12 new right IJ placed right femoral CVL removed 6/14 extubated 6/15 right IJ CVL removed, PIV in place  6/15 emergent hemodialysis due to volume overload   Studies: Ct Abdomen Pelvis Wo Contrast  Result Date:  03/17/2019 CLINICAL DATA:  Overdose, abdominal distension EXAM: CT ABDOMEN AND PELVIS WITHOUT CONTRAST TECHNIQUE: Multidetector CT imaging of the abdomen and pelvis was performed following the standard protocol without IV contrast. COMPARISON:  04/27/2007 FINDINGS: Lower chest: Small bilateral pleural effusions and associated atelectasis or consolidation. Hepatobiliary: No focal liver abnormality is seen. Status post cholecystectomy. No biliary dilatation. Pancreas: Unremarkable. No pancreatic ductal dilatation or surrounding inflammatory changes. Spleen: Normal in size without significant abnormality. Adrenals/Urinary Tract: Adrenal glands are unremarkable. Kidneys are normal, without renal calculi, solid lesion, or hydronephrosis. Foley catheter in the bladder Stomach/Bowel: Appendix is surgically absent. The stomach, small bowel, and colon are diffusely distended by air and fluid, with fluid present to the rectum. Vascular/Lymphatic: Right femoral central venous catheter. No enlarged abdominal or pelvic lymph nodes. Reproductive: No mass or other significant abnormality. Other: No abdominal wall hernia or abnormality. Small volume ascites. Anasarca. Musculoskeletal: No acute or significant osseous findings. IMPRESSION: 1. The stomach, small bowel, and colon are diffusely distended by air and fluid, with fluid present to the rectum. Findings are consistent with ileus. 2.  Pleural effusions, ascites, and anasarca. 3.  Status post cholecystectomy and appendectomy. Electronically Signed   By: Eddie Candle M.D.   On: 03/17/2019 08:35   Dg Abd 1 View  Result Date: 03/21/2019 CLINICAL DATA:  Ileus follow-up. EXAM: ABDOMEN - 1 VIEW COMPARISON:  March 19, 2019 FINDINGS: Air-filled dilated loops of primarily: Remain, similar to mildly improved in the interval. No other interval changes. An NG tube terminates in the left upper quadrant. IMPRESSION: 1. Mild improvement in ileus. 2. The NG tube terminates in the left upper  quadrant. Electronically Signed   By: Dorise Bullion III M.D   On: 03/21/2019 08:14   Dg Abd 1 View  Result Date: 03/19/2019 CLINICAL DATA:  Follow-up ileus EXAM: ABDOMEN - 1 VIEW COMPARISON:  03/18/2019 FINDINGS: Gastric catheter is again noted within the stomach. Right femoral central line is noted. Diffuse gaseous distension of the colon is again seen with improved appearance of the small bowel with only a few mildly prominent loops identified. No free air is seen. No bony abnormality is  noted. IMPRESSION: Improving ileus with only colonic distension on the current exam. Electronically Signed   By: Inez Catalina M.D.   On: 03/19/2019 08:06   Dg Abd 1 View  Result Date: 03/18/2019 CLINICAL DATA:  Follow-up ileus EXAM: ABDOMEN - 1 VIEW COMPARISON:  Film from the previous day. FINDINGS: Gastric catheter is noted coiled within the stomach. There remains scattered large and small bowel gas. Some progression and small-bowel dilatation is noted when compare with the previous day. Right femoral central line is seen. No bony abnormality is noted. IMPRESSION: Gaseous distension of the large and small bowel with some small bowel dilatation. This would be consistent with a generalized ileus. Correlation with the physical exam is recommended. Electronically Signed   By: Inez Catalina M.D.   On: 03/18/2019 07:31   Dg Abd 1 View  Result Date: 03/17/2019 CLINICAL DATA:  Check gastric catheter placement EXAM: ABDOMEN - 1 VIEW COMPARISON:  None. FINDINGS: Gastric catheter is noted coiled within the stomach. Scattered large and small bowel gas is seen similar to that seen on prior CT examination. No free air is noted. IMPRESSION: Gastric catheter coiled within the stomach. Electronically Signed   By: Inez Catalina M.D.   On: 03/17/2019 10:09   Dg Abd 1 View  Result Date: 03/14/2019 CLINICAL DATA:  28 year old male with history of abdominal pain. EXAM: ABDOMEN - 1 VIEW COMPARISON:  03/09/2019. FINDINGS: Right femoral  central venous catheter with tip projecting over the expected location of the proximal right common femoral vein. Gas and stool are seen scattered throughout the colon extending to the level of the distal rectum. No pathologic distension of small bowel is noted. Several nondilated gas-filled loops of small bowel or noted projecting over the central abdomen. No gross evidence of pneumoperitoneum. IMPRESSION: 1. Nonspecific, nonobstructive bowel gas pattern, as above. 2. No pneumoperitoneum. Electronically Signed   By: Vinnie Langton M.D.   On: 03/14/2019 22:34   Dg Abd 1 View  Result Date: 03/09/2019 CLINICAL DATA:  28 year old male presenting after drug overdose. EXAM: ABDOMEN - 1 VIEW COMPARISON:  Portable chest today. CT Abdomen and Pelvis 05/02/2007. FINDINGS: Semi upright AP view at 0510 hours. Enteric tube side hole is at the level of the gastric body. Nonobstructed bowel-gas pattern. No osseous abnormality identified. IMPRESSION: Enteric tube side hole at the level of the gastric body. Normal visible bowel gas pattern. Electronically Signed   By: Genevie Ann M.D.   On: 03/09/2019 05:46   Ct Head Wo Contrast  Result Date: 03/13/2019 CLINICAL DATA:  Drug overdose EXAM: CT HEAD WITHOUT CONTRAST TECHNIQUE: Contiguous axial images were obtained from the base of the skull through the vertex without intravenous contrast. COMPARISON:  Head CT 12/29/2006 FINDINGS: Brain: There is no mass, hemorrhage or extra-axial collection. The size and configuration of the ventricles and extra-axial CSF spaces are normal. The brain parenchyma is normal, without acute or chronic infarction. Vascular: No abnormal hyperdensity of the major intracranial arteries or dural venous sinuses. No intracranial atherosclerosis. Skull: The visualized skull base, calvarium and extracranial soft tissues are normal. Sinuses/Orbits: No fluid levels or advanced mucosal thickening of the visualized paranasal sinuses. No mastoid or middle ear  effusion. The orbits are normal. IMPRESSION: Normal head CT. Electronically Signed   By: Ulyses Jarred M.D.   On: 03/13/2019 02:30   Nm Pulmonary Perf And Vent  Result Date: 03/16/2019 CLINICAL DATA:  Shortness of breath EXAM: NUCLEAR MEDICINE VENTILATION - PERFUSION LUNG SCAN VIEWS: Anterior and posterior: Ventilation  and perfusion. Patient was unable to tolerate additional imaging. RADIOPHARMACEUTICALS:  32.1 mCi of Tc-70m DTPA aerosol inhalation and 4.38 mCi Tc16m MAA IV COMPARISON:  Chest radiograph March 15, 2017 FINDINGS: Ventilation: Radiotracer uptake is homogeneous and symmetric bilaterally. No evident ventilation defects. Perfusion: Radiotracer uptake is homogeneous and symmetric bilaterally. No perfusion defects evident. IMPRESSION: No ventilation or perfusion defects. Very low probability of pulmonary embolus. Electronically Signed   By: Lowella Grip III M.D.   On: 03/16/2019 13:22   Dg Chest Port 1 View  Result Date: 03/22/2019 CLINICAL DATA:  Acute respiratory failure EXAM: PORTABLE CHEST 1 VIEW COMPARISON:  Radiograph 03/22/2019 FINDINGS: Interval extubation. NG tube remains in the stomach. Central venous lines in the mid SVC unchanged. Normal cardiac silhouette. Diffuse bilateral nodular airspace disease is not improved. Increased density in the RIGHT lower lobe. No pneumothorax IMPRESSION: Is 1. No improvement in diffuse bilateral nodular airspace disease. Some increased focality in the RIGHT lower lobe. 2. Extubation. 3. Additional support apparatus stable. Electronically Signed   By: Suzy Bouchard M.D.   On: 03/22/2019 20:01   Dg Chest Port 1 View  Result Date: 03/22/2019 CLINICAL DATA:  Acute respiratory failure. Endotracheally intubated. EXAM: PORTABLE CHEST 1 VIEW COMPARISON:  03/20/2019 FINDINGS: Support lines and tubes in appropriate position. Low lung volumes are again seen. Moderate diffuse pulmonary airspace disease shows no significant change. Heart size is stable.  IMPRESSION: Moderate diffuse pulmonary airspace disease, without significant change. Electronically Signed   By: Earle Gell M.D.   On: 03/22/2019 07:34   Dg Chest Port 1 View  Result Date: 03/20/2019 CLINICAL DATA:  Central line placement EXAM: PORTABLE CHEST 1 VIEW COMPARISON:  03/19/2019 FINDINGS: There is a newly placed right IJ central venous catheter with tip projecting over the proximal SVC. The left-sided central venous catheter is stable in positioning. The endotracheal tube terminates above the carina. The enteric tube extends below the left hemidiaphragm. There are diffuse bilateral hazy airspace opacities with no significant interval improvement. No pneumothorax. The heart size is stable. IMPRESSION: 1. Newly placed right-sided central venous catheter tip terminates over the proximal SVC. There is no pneumothorax. The remaining lines and tubes are as above. 2. Otherwise, no significant interval change in appearance of the lungs. Electronically Signed   By: Constance Holster M.D.   On: 03/20/2019 01:19   Dg Chest Port 1 View  Result Date: 03/19/2019 CLINICAL DATA:  Status post intubation EXAM: PORTABLE CHEST 1 VIEW COMPARISON:  03/19/2019 FINDINGS: Endotracheal tube is now seen in satisfactory position. Gastric catheter and left jugular central line are again seen. Cardiac shadow is stable. Stable bilateral infiltrates. IMPRESSION: Endotracheal tube in satisfactory position. The remainder of the exam is stable. Electronically Signed   By: Inez Catalina M.D.   On: 03/19/2019 08:07   Dg Chest Port 1 View  Result Date: 03/19/2019 CLINICAL DATA:  Acute respiratory failure EXAM: PORTABLE CHEST 1 VIEW COMPARISON:  03/18/2019 FINDINGS: Cardiac shadow is stable. Gastric catheter and left jugular central line are again seen and stable. Patchy bilateral infiltrates are again identified and stable given some technical variation in the film. No sizable effusion or pneumothorax is noted. No bony  abnormality is seen. IMPRESSION: Stable patchy infiltrates bilaterally. Electronically Signed   By: Inez Catalina M.D.   On: 03/19/2019 08:04   Dg Chest Port 1 View  Result Date: 03/18/2019 CLINICAL DATA:  Acute respiratory failure EXAM: PORTABLE CHEST 1 VIEW COMPARISON:  03/17/2019 FINDINGS: Cardiac shadow is stable. Left jugular  central line is again seen and stable. Gastric catheter is noted coiled within the stomach. Patchy infiltrates are noted bilaterally stable from the prior exam. No new focal abnormality is noted. IMPRESSION: Stable bilateral infiltrates. Electronically Signed   By: Inez Catalina M.D.   On: 03/18/2019 07:30   Dg Chest Port 1 View  Result Date: 03/17/2019 CLINICAL DATA:  Respiratory failure, distress EXAM: PORTABLE CHEST 1 VIEW COMPARISON:  03/16/2019 FINDINGS: Left central line is unchanged. NG tube placement into the stomach. Bilateral interstitial and airspace opacities are similar prior study. Mild cardiomegaly. Small bilateral effusions. No acute bony abnormality. IMPRESSION: Diffuse interstitial and airspace opacities are similar prior study. Small effusions. Electronically Signed   By: Rolm Baptise M.D.   On: 03/17/2019 10:08   Dg Chest Port 1 View  Result Date: 03/16/2019 CLINICAL DATA:  Chest pain. EXAM: PORTABLE CHEST 1 VIEW COMPARISON:  One-view chest x-ray 03/13/2019 FINDINGS: A left IJ line is stable. Heart size is exaggerated by low lung volumes. Patchy bilateral interstitial and airspace opacities have increased. Airspace disease is asymmetric on the left. No definite effusions are present. Visualized soft tissues and bony thorax are unremarkable. IMPRESSION: 1. Increasing interstitial and airspace opacities, left greater than right. Findings are compatible with ARDS or infection. 2. Stable left IJ line. Electronically Signed   By: San Morelle M.D.   On: 03/16/2019 04:57   Dg Chest Port 1 View  Result Date: 03/13/2019 CLINICAL DATA:  Acute respiratory  failure EXAM: PORTABLE CHEST 1 VIEW COMPARISON:  03/11/2019 FINDINGS: The left-sided catheter tip projects over the SVC. The heart size is stable from prior study. There is worsening vascular congestion in worsening bilateral airspace opacities. There are likely bilateral pleural effusions. No evidence of a pneumothorax. The patient has been extubated. Enteric tube has been removed. IMPRESSION: 1. Status post extubation. 2. Remaining lines and tubes as above. 3. Worsening vascular congestion. Persistent small bilateral pleural effusions. Electronically Signed   By: Constance Holster M.D.   On: 03/13/2019 03:46   Dg Chest Port 1 View  Result Date: 03/11/2019 CLINICAL DATA:  Acute respiratory failure EXAM: PORTABLE CHEST 1 VIEW COMPARISON:  03/10/2019 FINDINGS: Cardiac shadow is stable. Endotracheal tube, gastric catheter and left jugular central line are again seen and stable. Increasing vascular congestion is noted when compare with the prior exam with enlarging left pleural effusion. No focal confluent infiltrate is seen. IMPRESSION: Worsening vascular congestion with left-sided pleural effusion. Tubes and lines as described. Electronically Signed   By: Inez Catalina M.D.   On: 03/11/2019 07:07   Dg Chest Port 1 View  Result Date: 03/10/2019 CLINICAL DATA:  Hypoxia.  Patient on a ventilator. EXAM: PORTABLE CHEST 1 VIEW COMPARISON:  Earlier film, same date. FINDINGS: The endotracheal tube is in good position, 5 cm above the carina. The NG tube is coursing down the esophagus and into the stomach. The left IJ central venous catheter is stable. The lungs demonstrate improved aeration compared to the earlier film. Less edema and atelectasis. No pneumothorax. IMPRESSION: 1. Support apparatus in good position without complicating features. 2. Improved lung aeration since earlier chest film. Electronically Signed   By: Marijo Sanes M.D.   On: 03/10/2019 20:35   Portable Chest Xray  Result Date:  03/10/2019 CLINICAL DATA:  Respiratory failure EXAM: PORTABLE CHEST 1 VIEW COMPARISON:  03/09/2019 FINDINGS: Endotracheal tube tip at the clavicular heads. The orogastric tube at least reaches the stomach. Left IJ dialysis catheter tip at the upper cavoatrial junction.  Worsening aeration with obscured left diaphragm. There is diffuse hazy lung opacity. Suspect layering pleural effusions. Cardiomegaly and vascular pedicle widening. IMPRESSION: Stable, unremarkable hardware positioning. Worsening aeration, likely atelectasis, layering fluid, and edema. Electronically Signed   By: Monte Fantasia M.D.   On: 03/10/2019 06:22   Dg Chest Port 1 View  Result Date: 03/09/2019 CLINICAL DATA:  Central line placement EXAM: PORTABLE CHEST 1 VIEW COMPARISON:  03/09/2019 FINDINGS: The endotracheal tube terminates above the carina. The newly placed left-sided central venous catheter tip projects over the SVC/distal left brachiocephalic vein. The enteric tube extends below the left hemidiaphragm. There is no left-sided pneumothorax. The heart size is stable. No right-sided pneumothorax. Coarsened lung markings are again identified bilaterally. IMPRESSION: 1. Newly placed left-sided central venous catheter as above with no evidence of a pneumothorax. 2. Remaining lines and tubes as above. 3. Stable cardiac size. Persistent coarse airspace opacities bilaterally similar to prior study. Electronically Signed   By: Constance Holster M.D.   On: 03/09/2019 19:58   Dg Chest Port 1 View  Result Date: 03/09/2019 CLINICAL DATA:  28 year old male presenting after drug overdose. EXAM: PORTABLE CHEST 1 VIEW COMPARISON:  02/09/2019 and earlier. FINDINGS: Portable AP semi upright view at 0510 hours. Intubated, endotracheal tube tip at the level the clavicles. Enteric tube courses to the abdomen, tip not included. Normal cardiac size and mediastinal contours. Patchy and confluent perihilar and lung base opacity. No superimposed pneumothorax.  No overt edema. No pleural effusion identified. Paucity of bowel gas in the upper abdomen. No osseous abnormality identified. IMPRESSION: 1. Endotracheal tube tip at the level the clavicles. Enteric tube courses to the abdomen, tip not included. 2. Patchy and confluent perihilar and lung base opacity. Consider aspiration. No pulmonary edema or pleural effusion. Electronically Signed   By: Genevie Ann M.D.   On: 03/09/2019 05:45   [place] Ekg Site Rite  Result Date: 03/19/2019 If Site Rite image not attached, placement could not be confirmed due to current cardiac rhythm.   Consults: Treatment Team:  Murlean Iba, MD Lavella Hammock, MD [facility], [facility], MD   Subjective:    Overnight Issues: Has transitioned to nasal cannula.  Overnight had issues with agitation and had to be placed on Precedex.  He is off Precedex now.  Appears calm.  Note that he has been on methylprednisolone for reasons that are not clear.  This may be adding to ICU delirium/psychosis.  Objective:  Vital signs for last 24 hours: Temp:  [98.8 F (37.1 C)-100.4 F (38 C)] 99.4 F (37.4 C) (06/17 1200) Pulse Rate:  [61-110] 96 (06/17 1300) Resp:  [17-34] 24 (06/17 1300) BP: (111-139)/(63-88) 138/83 (06/17 1300) SpO2:  [84 %-100 %] 93 % (06/17 1300) FiO2 (%):  [60 %-100 %] 60 % (06/17 0800) Weight:  [78.5 kg] 78.5 kg (06/17 0413)  Hemodynamic parameters for last 24 hours:    Intake/Output from previous day: 06/16 0701 - 06/17 0700 In: 138.6 [I.V.:18.6; NG/GT:70; IV Piggyback:50] Out: 7342 [Urine:3535]  Intake/Output this shift: Total I/O In: 1062.9 [P.O.:1000; I.V.:42.9; Other:20] Out: 1400 [Urine:1400]  Vent settings for last 24 hours: FiO2 (%):  [60 %-100 %] 60 %  Physical Exam:  General: Overweight male,on cell cannula, comfortable, calm.  Cooperative. HEENT: PERRLA, trachea midline, thick neck, left IJ dialysis catheter in place. Neuro:  Awake alert, no focal deficits, cooperative. Cardiovascular:   pulse regular, S1-S2, no murmur regurg or gallop, +2 pulses bilaterally, +1 anasarca  Lungs: Coarse breath sounds.  No increased  work of breathing, no respiratory distress. Abdomen: Soft, obese, normoactive bowel sounds, NG out tolerating clear liquids Musculoskeletal: No joint deformities, positive range of motion Skin: Warm and dry, no rash or lesions  Assessment/Plan:   1.  Acute respiratory failure: Has transitioned to nasal cannula, addressing volume overload is key.  He is to have hemodialysis.  May go to the hemodialysis unit.  Arrangements for PermCath per nephrology.  Vascular has been consulted.  Appreciate input.  2.  Ileus: Resolved.  Tolerating clear liquids, will advance diet.  Speech pathology notes no issues with swallowing.  3.  Acute renal failure: Nonoliguric. Continue Foley catheter due to need for strict I&O's.  Intermittent dialysis per nephrology.  4.  Group B strep pneumonia: Completed antibiotic therapy.  5.  Nausea and vomiting: Resolved.  6.  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  Continue Remeron at bedtime.  Will need inpatient management and psychiatric unit once medically cleared.   LOS: 16 days   Additional comments: Multidisciplinary rounds were performed with the ICU team.    Critical Care Total Time*:   C. Derrill Kay, MD [facility] 03/25/2019  *Care during the described time interval was provided by me and/or other providers on the critical care team.  I have reviewed this patient's available data, including medical history, events of note, physical examination and test results as part of my evaluation.

## 2019-03-25 NOTE — Progress Notes (Signed)
Updated patient's family at this time.

## 2019-03-25 NOTE — Progress Notes (Signed)
[place] [facility] [place] NAME: Glenn Rasmussen    MR#:  [id number]  DATE OF BIRTH:  July 04, 1991  SUBJECTIVE:   Patient still on BiPAP  REVIEW OF SYSTEMS:   CONSTITUTIONAL: No documented fever. No fatigue, weakness. No weight gain, no weight loss.  EYES: No blurry or double vision.  ENT: No tinnitus. No postnasal drip. No redness of the oropharynx.  RESPIRATORY: No cough, no wheeze, no hemoptysis.  Positive dyspnea.  CARDIOVASCULAR: No chest pain. No orthopnea. No palpitations. No syncope.  GASTROINTESTINAL: No nausea, no vomiting or diarrhea. No abdominal pain. No melena or hematochezia.  GENITOURINARY:  No urgency. No frequency. No dysuria. No hematuria. No obstructive symptoms. No discharge. No pain. No significant abnormal bleeding ENDOCRINE: No polyuria or nocturia. No heat or cold intolerance.  HEMATOLOGY: No anemia. No bruising. No bleeding. No purpura. No petechiae INTEGUMENTARY: No rashes. No lesions.  MUSCULOSKELETAL: No arthritis. No swelling. No gout.  NEUROLOGIC: No numbness, tingling, or ataxia. No seizure-type activity.  PSYCHIATRIC: No anxiety. No insomnia. No ADD.     DRUG ALLERGIES:  No Known Allergies  VITALS:  Blood pressure 132/78, pulse 91, temperature 99.4 F (37.4 C), temperature source Axillary, resp. rate (!) 24, height 5' 7.01" (1.702 m), weight 78.5 kg, SpO2 96 %.  PHYSICAL EXAMINATION:  Physical Exam   GENERAL:  28 y.o.-year-old critically ill-appearing patient lying in the bed on BiPAP EYES: Pupils equal, round, reactive to light and accommodation. No scleral icterus. Extraocular muscles intact.  HEENT: Head atraumatic, normocephalic. Oropharynx and nasopharynx clear.  NECK:  Supple, no jugular venous distention. No thyroid enlargement, no tenderness.   LUNGS: distant breath sounds bilaterally, no wheezing Mild use of accessory muscles of respiration.  Decreased bibasilar breath sounds CARDIOVASCULAR: S1,  S2 normal. No murmurs, rubs, or gallops.  ABDOMEN: Soft, nontender, nondistended. Bowel sounds present. No organomegaly or mass.  EXTREMITIES: No  cyanosis, or clubbing.  1+ pedal edema noted NEUROLOGIC: Patient sedated    PSYCHIATRIC: on the vent SKIN: No obvious rash, lesion, or ulcer.    LABORATORY PANEL:   CBC Recent Labs  Lab 03/25/19 0512  WBC 13.1*  HGB 7.3*  HCT 22.3*  PLT 375   ------------------------------------------------------------------------------------------------------------------  Chemistries  Recent Labs  Lab 03/23/19 0423  03/25/19 0512  NA 143   < > 152*  K 4.9   < > 4.4  CL 99   < > 109  CO2 24   < > 23  GLUCOSE 144*   < > 136*  BUN 141*   < > 129*  CREATININE 6.86*   < > 5.60*  CALCIUM 7.7*   < > 8.7*  MG 2.5*   < > 2.7*  AST 40  --   --   ALT 60*  --   --   ALKPHOS 97  --   --   BILITOT 0.8  --   --    < > = values in this interval not displayed.   ------------------------------------------------------------------------------------------------------------------  Cardiac Enzymes Recent Labs  Lab 03/24/19 1415  TROPONINI 0.06*   ------------------------------------------------------------------------------------------------------------------  RADIOLOGY:  No results found.  EKG:   Orders placed or performed during the hospital encounter of 03/08/19  . ED EKG  . ED EKG  . ED EKG  . ED EKG  . EKG 12-Lead  . EKG 12-Lead  . EKG 12-Lead  . EKG 12-Lead  . EKG 12-Lead  . EKG 12-Lead  . EKG 12-Lead  .  EKG 12-Lead  . EKG 12-Lead  . EKG 12-Lead  . EKG 12-Lead  . EKG 12-Lead  . EKG 12-Lead  . EKG 12-Lead  . EKG - 12 lead  . EKG - 12 lead  . EKG 12-Lead  . EKG 12-Lead  . EKG 12-Lead  . EKG 12-Lead  . EKG 12-Lead  . EKG 12-Lead    ASSESSMENT AND PLAN:   28 year old male with past medical history significant for hypertension, IBS admitted secondary to overdose on Phenergan, losartan, Norvasc and Klonopin  1.  Acute  Hypoxic respiratory failure due to volume overload/?ARDS -Patient is extubated on 03/12/2019--re-intubated on 03/19/2019, then extubated on 03/23/2019 now back on BiPAP -EKG--sinus tachycardia -VQ scan low probability for PE. D/ced heparin drip. -Continue hemodialysis as per nephrology  2.Intentional drug overdose -patient overdosed on 38 tablets of Phenergan, 19 tablets of losartan, 42 tablets of Norvasc and 78 tablets of Klonopin -Urine tox positive for benzos and tricyclics -Appreciate psych consult.  Once patient is medically stable, will need inpatient psychiatry admission.  3.  Acute renal failure-likely ATN from hypotension.  -received  CRRT, metabolic acidosis.  Appreciate nephrology consult Now having hemodialysis  4. Pneumonia-follow- - Currently on Iv Zosyn  5. Anemia due to anemia of chronic disease and current illness follow CBC may need transfusion in the future  6.  GERD-Protonix  7.  Elevated troponin-likely demand ischemia-  Most recent echo this admission showing normal LV function and no wall motion abnormalities.      CODE STATUS: Full code  TOTAL TIME TAKING CARE OF THIS PATIENT: 35 minutes.   POSSIBLE D/C IN ? DAYS, DEPENDING ON CLINICAL CONDITION.  Overall long-term prognosis poor  Dustin Flock M.D on 03/25/2019 at 12:45 PM  Between 7am to 6pm - Pager - [phone]  After 6pm go to [email or web address] - password [email or web address]  Office  [phone]  CC: Primary care physician; Maeola Sarah, MD

## 2019-03-25 NOTE — Progress Notes (Signed)
Pt was alert and sitting upright in the bed upon ch arrival. Glenn Rasmussen was present w/ pt ministering to him when ch arrived. Pt was concerned about having a port placed in his arm. Pt was tearful yet had improved since the last visit. Pt was no longer on bi pap and no longer NPO as reported by nurse. Ch provided words of encouragement w/ sitter present and paraphrased Matt 11:29-31 with him to remind him to be gentle with himself. Ch shared w/ pt that it was normal for him to experience moral injury and that it is likely that he will temporarily be on dialysis. Pt appreciated visit and welcomes future visits.   F/u care should include communicating w/ care team about d/c plan; seeing how well pt recovered from port placement; and sharing scriptures w/ pt.     03/25/19 2300  Clinical Encounter Type  Visited With Patient;Health care provider  Visit Type Follow-up;Psychological support;Spiritual support;Social support;Critical Care  Referral From Nurse  Consult/Referral To [place] text;Emotional;Grief support  Stress Factors  Patient Stress Factors Health changes;Exhausted;Loss of control;Major life changes  Family Stress Factors None identified

## 2019-03-25 NOTE — Progress Notes (Signed)
HD Tx started w/o complication    06/38/68 1415  Vital Signs  Pulse Rate 97  Pulse Rate Source Monitor  Resp (!) 33  BP 128/73  BP Location Right Arm  BP Method Automatic  Patient Position (if appropriate) Lying  Oxygen Therapy  SpO2 95 %  O2 Device Nasal Cannula;HFNC  O2 Flow Rate (L/min) 8 L/min  Pulse Oximetry Type Continuous  During Hemodialysis Assessment  Blood Flow Rate (mL/min) 400 mL/min  Arterial Pressure (mmHg) -90 mmHg  Venous Pressure (mmHg) 90 mmHg  Transmembrane Pressure (mmHg) 100 mmHg  Ultrafiltration Rate (mL/min) 1500 mL/min  Dialysate Flow Rate (mL/min) 800 ml/min  Conductivity: Machine  13.8  HD Safety Checks Performed Yes  Dialysis Fluid Bolus Normal Saline  Bolus Amount (mL) 250 mL  Intra-Hemodialysis Comments Tx initiated

## 2019-03-26 ENCOUNTER — Encounter
Admission: EM | Disposition: A | Discharge status: Other Acute Inpatient Hospital | Payer: Self-pay | Source: Home / Self Care | Attending: Internal Medicine

## 2019-03-26 DIAGNOSIS — N186 End stage renal disease: Secondary | ICD-10-CM

## 2019-03-26 HISTORY — PX: DIALYSIS/PERMA CATHETER INSERTION: CATH118288

## 2019-03-26 LAB — GLUCOSE, CAPILLARY
Glucose-Capillary: 126 mg/dL — ABNORMAL HIGH (ref 70–99)
Glucose-Capillary: 106 mg/dL — ABNORMAL HIGH (ref 70–99)
Glucose-Capillary: 97 mg/dL (ref 70–99)
Glucose-Capillary: 114 mg/dL — ABNORMAL HIGH (ref 70–99)
Glucose-Capillary: 166 mg/dL — ABNORMAL HIGH (ref 70–99)
Glucose-Capillary: 121 mg/dL — ABNORMAL HIGH (ref 70–99)

## 2019-03-26 LAB — BASIC METABOLIC PANEL
Anion gap: 16 — ABNORMAL HIGH (ref 5–15)
BUN: 85 mg/dL — ABNORMAL HIGH (ref 6–20)
CO2: 27 mmol/L (ref 22–32)
Calcium: 9.2 mg/dL (ref 8.9–10.3)
Chloride: 101 mmol/L (ref 98–111)
Creatinine, Ser: 4.28 mg/dL — ABNORMAL HIGH (ref 0.61–1.24)
GFR calc Af Amer: 20 mL/min — ABNORMAL LOW (ref >60)
GFR calc non Af Amer: 17 mL/min — ABNORMAL LOW (ref >60)
Glucose, Bld: 116 mg/dL — ABNORMAL HIGH (ref 70–99)
Potassium: 3.7 mmol/L (ref 3.5–5.1)
Sodium: 144 mmol/L (ref 135–145)

## 2019-03-26 LAB — CBC WITH DIFFERENTIAL/PLATELET
Abs Immature Granulocytes: 0.31 10*3/uL — ABNORMAL HIGH (ref 0.00–0.07)
Basophils Absolute: 0 10*3/uL (ref 0.0–0.1)
Basophils Relative: 0 %
Eosinophils Absolute: 0.5 10*3/uL (ref 0.0–0.5)
Eosinophils Relative: 3 %
HCT: 26.2 % — ABNORMAL LOW (ref 39.0–52.0)
Hemoglobin: 8.7 g/dL — ABNORMAL LOW (ref 13.0–17.0)
Immature Granulocytes: 2 %
Lymphocytes Relative: 15 %
Lymphs Abs: 2.4 10*3/uL (ref 0.7–4.0)
MCH: 27.8 pg (ref 26.0–34.0)
MCHC: 33.2 g/dL (ref 30.0–36.0)
MCV: 83.7 fL (ref 80.0–100.0)
Monocytes Absolute: 1 10*3/uL (ref 0.1–1.0)
Monocytes Relative: 6 %
Neutro Abs: 12.3 10*3/uL — ABNORMAL HIGH (ref 1.7–7.7)
Neutrophils Relative %: 74 %
Platelets: 390 10*3/uL (ref 150–400)
RBC: 3.13 MIL/uL — ABNORMAL LOW (ref 4.22–5.81)
RDW: 13.2 % (ref 11.5–15.5)
WBC: 16.6 10*3/uL — ABNORMAL HIGH (ref 4.0–10.5)
nRBC: 0 % (ref 0.0–0.2)

## 2019-03-26 LAB — MAGNESIUM: Magnesium: 2.5 mg/dL — ABNORMAL HIGH (ref 1.7–2.4)

## 2019-03-26 SURGERY — DIALYSIS/PERMA CATHETER INSERTION
Anesthesia: Choice

## 2019-03-26 MED ORDER — MIDAZOLAM HCL 2 MG/ML PO SYRP
8.0000 mg | ORAL_SOLUTION | Freq: Once | ORAL | Status: DC | PRN
  Filled 2019-03-26: qty 4

## 2019-03-26 MED ORDER — HYDROMORPHONE HCL 1 MG/ML IJ SOLN: 1.0000 mg | Freq: Once | INTRAMUSCULAR | Status: DC | PRN

## 2019-03-26 MED ORDER — ONDANSETRON HCL 4 MG/2ML IJ SOLN: 4.0000 mg | Freq: Four times a day (QID) | INTRAMUSCULAR | Status: DC | PRN

## 2019-03-26 MED ORDER — LIDOCAINE-EPINEPHRINE (PF) 1 %-1:200000 IJ SOLN
INTRAMUSCULAR | Status: AC
  Filled 2019-03-26: qty 30

## 2019-03-26 MED ORDER — IPRATROPIUM-ALBUTEROL 0.5-2.5 (3) MG/3ML IN SOLN
3.0000 mL | Freq: Four times a day (QID) | RESPIRATORY_TRACT | Status: DC
  Administered 2019-03-26 – 2019-03-27 (×2): 3 mL via RESPIRATORY_TRACT
  Filled 2019-03-26 (×2): qty 3

## 2019-03-26 MED ORDER — CEFAZOLIN SODIUM-DEXTROSE 1-4 GM/50ML-% IV SOLN
INTRAVENOUS | Status: AC
  Filled 2019-03-26: qty 50

## 2019-03-26 MED ORDER — CEFAZOLIN SODIUM-DEXTROSE 1-4 GM/50ML-% IV SOLN: 1.0000 g | Freq: Once | INTRAVENOUS | Status: DC

## 2019-03-26 MED ORDER — FENTANYL CITRATE (PF) 100 MCG/2ML IJ SOLN
INTRAMUSCULAR | Status: AC
  Filled 2019-03-26: qty 2

## 2019-03-26 MED ORDER — QUETIAPINE FUMARATE 25 MG PO TABS
100.0000 mg | ORAL_TABLET | Freq: Every evening | ORAL | Status: DC
  Administered 2019-03-27: 22:00:00 100 mg via ORAL
  Filled 2019-03-26 (×2): qty 4

## 2019-03-26 MED ORDER — SODIUM CHLORIDE 0.9 % IV SOLN
INTRAVENOUS | Status: DC
  Administered 2019-03-26: 08:00:00 via INTRAVENOUS

## 2019-03-26 MED ORDER — FAMOTIDINE 20 MG PO TABS: 40.0000 mg | ORAL_TABLET | Freq: Once | ORAL | Status: DC | PRN

## 2019-03-26 MED ORDER — QUETIAPINE FUMARATE 25 MG PO TABS
100.0000 mg | ORAL_TABLET | Freq: Every evening | ORAL | Status: DC | PRN
  Administered 2019-03-26: 100 mg via ORAL

## 2019-03-26 MED ORDER — VENLAFAXINE HCL ER 75 MG PO CP24
150.0000 mg | ORAL_CAPSULE | Freq: Every day | ORAL | Status: DC
  Administered 2019-03-27 – 2019-03-30 (×4): 150 mg via ORAL
  Filled 2019-03-26 (×4): qty 2

## 2019-03-26 MED ORDER — HEPARIN SODIUM (PORCINE) 10000 UNIT/ML IJ SOLN
INTRAMUSCULAR | Status: AC
  Filled 2019-03-26: qty 1

## 2019-03-26 MED ORDER — DIPHENHYDRAMINE HCL 50 MG/ML IJ SOLN: 50.0000 mg | Freq: Once | INTRAMUSCULAR | Status: DC | PRN

## 2019-03-26 MED ORDER — CEFAZOLIN SODIUM-DEXTROSE 1-4 GM/50ML-% IV SOLN
1.0000 g | Freq: Once | INTRAVENOUS | Status: AC
  Administered 2019-03-26: 01:00:00 1 g via INTRAVENOUS
  Filled 2019-03-26: qty 50

## 2019-03-26 MED ORDER — METHYLPREDNISOLONE SODIUM SUCC 125 MG IJ SOLR: 125.0000 mg | Freq: Once | INTRAMUSCULAR | Status: DC | PRN

## 2019-03-26 MED ORDER — MIDAZOLAM HCL 2 MG/2ML IJ SOLN
INTRAMUSCULAR | Status: AC
  Filled 2019-03-26: qty 4

## 2019-03-26 MED ORDER — SODIUM CHLORIDE FLUSH 0.9 % IV SOLN
INTRAVENOUS | Status: AC
  Filled 2019-03-26: qty 10

## 2019-03-26 MED ORDER — INSULIN ASPART 100 UNIT/ML ~~LOC~~ SOLN
0.0000 [IU] | Freq: Three times a day (TID) | SUBCUTANEOUS | Status: DC
  Administered 2019-03-26: 2 [IU] via SUBCUTANEOUS
  Administered 2019-03-28: 1 [IU] via SUBCUTANEOUS
  Filled 2019-03-26 (×2): qty 1

## 2019-03-26 MED ORDER — MIDAZOLAM HCL 2 MG/2ML IJ SOLN
INTRAMUSCULAR | Status: DC | PRN
  Administered 2019-03-26 (×2): 2 mg via INTRAVENOUS

## 2019-03-26 MED ORDER — FENTANYL CITRATE (PF) 100 MCG/2ML IJ SOLN
INTRAMUSCULAR | Status: DC | PRN
  Administered 2019-03-26 (×2): 50 ug via INTRAVENOUS

## 2019-03-26 SURGICAL SUPPLY — 7 items
CATH PALINDROME RT-P 15FX19CM (CATHETERS) ×3 IMPLANT
DERMABOND ADVANCED (GAUZE/BANDAGES/DRESSINGS) ×2
DERMABOND ADVANCED .7 DNX12 (GAUZE/BANDAGES/DRESSINGS) ×1 IMPLANT
PACK ANGIOGRAPHY (CUSTOM PROCEDURE TRAY) ×3 IMPLANT
SUT MNCRL AB 4-0 PS2 18 (SUTURE) ×3 IMPLANT
SUT PROLENE 0 CT 1 30 (SUTURE) ×3 IMPLANT
SUT VICRYL+ 3-0 36IN CT-1 (SUTURE) ×3 IMPLANT

## 2019-03-26 NOTE — Op Note (Signed)
OPERATIVE NOTE    PRE-OPERATIVE DIAGNOSIS: 1. ESRD   POST-OPERATIVE DIAGNOSIS: same as above  PROCEDURE: 1. Ultrasound guidance for vascular access to the right internal jugular vein 2. Fluoroscopic guidance for placement of catheter 3. Placement of a 19 cm tip to cuff tunneled hemodialysis catheter via the right internal jugular vein  SURGEON: Leotis Pain, MD  ANESTHESIA:  Local with Moderate conscious sedation for approximately 15 minutes using 4 mg of Versed and 100 mcg of Fentanyl  ESTIMATED BLOOD LOSS: 5 cc  FLUORO TIME: less than one minute  CONTRAST: none  FINDING(S): 1.  Patent right internal jugular vein  SPECIMEN(S):  None  INDICATIONS:   Glenn Rasmussen is a 28 y.o.male who presents with renal failure and a drug overdose.  The patient needs long term dialysis access for their ESRD, and a Permcath is necessary.  Risks and benefits are discussed and informed consent is obtained.    DESCRIPTION: After obtaining full informed written consent, the patient was brought back to the vascular suited. The patient's right neck and chest were sterilely prepped and draped in a sterile surgical field was created. Moderate conscious sedation was administered during a face to face encounter with the patient throughout the procedure with my supervision of the RN administering medicines and monitoring the patient's vital signs, pulse oximetry, telemetry and mental status throughout from the start of the procedure until the patient was taken to the recovery room.  The right internal jugular vein was visualized with ultrasound and found to be patent. It was then accessed under direct ultrasound guidance and a permanent image was recorded. A wire was placed. After skin nick and dilatation, the peel-away sheath was placed over the wire. I then turned my attention to an area under the clavicle. Approximately 1-2 fingerbreadths below the clavicle a small counterincision was created and tunneled  from the subclavicular incision to the access site. Using fluoroscopic guidance, a 19 centimeter tip to cuff tunneled hemodialysis catheter was selected, and tunneled from the subclavicular incision to the access site. It was then placed through the peel-away sheath and the peel-away sheath was removed. Using fluoroscopic guidance the catheter tips were parked in the right atrium. The appropriate distal connectors were placed. It withdrew blood well and flushed easily with heparinized saline and a concentrated heparin solution was then placed. It was secured to the chest wall with 2 Prolene sutures. The access incision was closed single 4-0 Monocryl. A 4-0 Monocryl pursestring suture was placed around the exit site. Sterile dressings were placed. The patient tolerated the procedure well and was taken to the recovery room in stable condition.  COMPLICATIONS: None  CONDITION: Stable  Leotis Pain, MD 03/26/2019 9:04 AM   This note was created with Dragon Medical transcription system. Any errors in dictation are purely unintentional.

## 2019-03-26 NOTE — Progress Notes (Signed)
   03/26/19 1100  Clinical Encounter Type  Visited With Patient  Visit Type Follow-up;Spiritual support  Referral From Chaplain  Consult/Referral To Chaplain  Spiritual Encounters  Spiritual Needs Prayer;Emotional  Stress Factors  Patient Stress Factors Exhausted;Health changes  Chaplain talk with patient on continual process of how he feels about himself. Chaplain help patient identify the good person that he is on the inside. Patient agree with Chaplain and thanked Chaplain for all she did for him now and previous visits. Patient seemed excited having one cath removed and moving to [facility]. Chaplain told patient that would be a great opportunity where he could process his thoughts, anxiety and concerns. Chaplain believe patient would benefit from visit with Chaplain the remainder of his stay.

## 2019-03-26 NOTE — Progress Notes (Signed)
Patient has not voided since foley removed at 1100 per CCU RN. Bladder scan showed approximately 350 mls. MD notified, order given to continue to monitor until > 400.

## 2019-03-26 NOTE — Plan of Care (Signed)
  Problem: Education: Goal: Knowledge of warning signs, risks, and behaviors that relate to suicide ideation and self-harm behaviors will improve Outcome: Progressing   Problem: Health Behavior/Discharge (Transition) Planning: Goal: Ability to manage health-related needs will improve Outcome: Progressing   Problem: Clinical Measurements: Goal: Remain free from any harm during hospitalization Outcome: Progressing   Problem: Nutrition: Goal: Adequate fluids and nutrition will be maintained Outcome: Progressing   Problem: Coping: Goal: Ability to disclose and discuss thoughts of suicide and self-harm will improve Outcome: Progressing   Problem: Medication Management: Goal: Adhere to prescribed medication regimen Outcome: Progressing   Problem: Sleep Hygiene: Goal: Ability to obtain adequate restful sleep will improve Outcome: Progressing   Problem: Self Esteem: Goal: Ability to verbalize positive feeling about self will improve Outcome: Progressing   Problem: Education: Goal: Knowledge of General Education information will improve Description: Including pain rating scale, medication(s)/side effects and non-pharmacologic comfort measures Outcome: Progressing   Problem: Health Behavior/Discharge Planning: Goal: Ability to manage health-related needs will improve Outcome: Progressing   Problem: Clinical Measurements: Goal: Ability to maintain clinical measurements within normal limits will improve Outcome: Progressing Goal: Will remain free from infection Outcome: Progressing Goal: Diagnostic test results will improve Outcome: Progressing Goal: Respiratory complications will improve Outcome: Progressing Goal: Cardiovascular complication will be avoided Outcome: Progressing   Problem: Activity: Goal: Risk for activity intolerance will decrease Outcome: Progressing   Problem: Nutrition: Goal: Adequate nutrition will be maintained Outcome: Progressing   Problem:  Coping: Goal: Level of anxiety will decrease Outcome: Progressing   Problem: Elimination: Goal: Will not experience complications related to bowel motility Outcome: Progressing Goal: Will not experience complications related to urinary retention Outcome: Progressing   Problem: Pain Managment: Goal: General experience of comfort will improve Outcome: Progressing   Problem: Safety: Goal: Ability to remain free from injury will improve Outcome: Progressing   Problem: Skin Integrity: Goal: Risk for impaired skin integrity will decrease Outcome: Progressing

## 2019-03-26 NOTE — Progress Notes (Signed)
After further assessment and discussion of patient's  status and medical condition during multidisciplinary rounds, the plan is outlined as below:  1.  Patient had PermCath placed today.  Dialysis per nephrology.   2.  Patient has been on nasal cannula.  Tolerating.  Using BiPAP at night.  Transfer to general medical ward with BiPAP nocturnally.  3.  Recommend PT OT consults.  4.  Will need inpatient behavioral health admission once medically cleared.   Renold Don, MD [facility]

## 2019-03-26 NOTE — Progress Notes (Signed)
1110 hrs: Patient's mother updated at this time.  1345 hrs: Report given to 2A RN.  1400 hrs: Patient's mother updated again at this time.

## 2019-03-26 NOTE — Progress Notes (Signed)
Assisted tele visit to patient with family member.  Glenn Rasmussen William, RN   

## 2019-03-26 NOTE — Progress Notes (Signed)
[facility] NAME: Glenn Rasmussen    MR#:  [id number]  DATE OF BIRTH:  07-13-1991  SUBJECTIVE:   Status post left IJ PermCath placement today.  No other acute events overnight.  Patient denies any suicidal or homicidal ideations.  REVIEW OF SYSTEMS:    Review of Systems  Constitutional: Negative for chills and fever.  HENT: Negative for congestion and tinnitus.   Eyes: Negative for blurred vision and double vision.  Respiratory: Negative for cough, shortness of breath and wheezing.   Cardiovascular: Negative for chest pain, orthopnea and PND.  Gastrointestinal: Negative for abdominal pain, diarrhea, nausea and vomiting.  Genitourinary: Negative for dysuria and hematuria.  Neurological: Negative for dizziness, sensory change and focal weakness.  All other systems reviewed and are negative.   Nutrition: Renal with fluid restriction.  Tolerating Diet: Yes Tolerating PT: Await Eval.   DRUG ALLERGIES:  No Known Allergies  VITALS:  Blood pressure 105/70, pulse 80, temperature 98.8 F (37.1 C), temperature source Axillary, resp. rate (!) 21, height 5\' 7"  (1.702 m), weight 90.7 kg, SpO2 96 %.  PHYSICAL EXAMINATION:   Physical Exam  GENERAL:  28 y.o.-year-old patient lying in bed in no acute distress.  EYES: Pupils equal, round, reactive to light and accommodation. No scleral icterus. Extraocular muscles intact.  HEENT: Head atraumatic, normocephalic. Oropharynx and nasopharynx clear.  NECK:  Supple, no jugular venous distention. No thyroid enlargement, no tenderness.  LUNGS: Normal breath sounds bilaterally, no wheezing, rales, rhonchi. No use of accessory muscles of respiration.  CARDIOVASCULAR: S1, S2 normal. No murmurs, rubs, or gallops.  ABDOMEN: Soft, nontender, nondistended. Bowel sounds present. No organomegaly or mass.  EXTREMITIES: No cyanosis, clubbing or edema b/l.    NEUROLOGIC: Cranial nerves II through XII are  intact. No focal Motor or sensory deficits b/l.   PSYCHIATRIC: The patient is alert and oriented x 3.  Flat affect SKIN: No obvious rash, lesion, or ulcer.    LABORATORY PANEL:   CBC Recent Labs  Lab 03/26/19 0503  WBC 16.6*  HGB 8.7*  HCT 26.2*  PLT 390   ------------------------------------------------------------------------------------------------------------------  Chemistries  Recent Labs  Lab 03/23/19 0423  03/26/19 0503  NA 143   < > 144  K 4.9   < > 3.7  CL 99   < > 101  CO2 24   < > 27  GLUCOSE 144*   < > 116*  BUN 141*   < > 85*  CREATININE 6.86*   < > 4.28*  CALCIUM 7.7*   < > 9.2  MG 2.5*   < > 2.5*  AST 40  --   --   ALT 60*  --   --   ALKPHOS 97  --   --   BILITOT 0.8  --   --    < > = values in this interval not displayed.   ------------------------------------------------------------------------------------------------------------------  Cardiac Enzymes Recent Labs  Lab 03/24/19 1415  TROPONINI 0.06*   ------------------------------------------------------------------------------------------------------------------  RADIOLOGY:  No results found.   ASSESSMENT AND PLAN:   28 year old male with past medical history of hypertension, IBS, depression who presented to the hospital due to altered mental status secondary to drug overdose and also noted to be in acute kidney injury.  1.  Acute Hypoxic respiratory failure due to volume overload/?ARDS -Patient is extubated on 03/12/2019--re-intubated on 03/19/2019, then extubated on 03/23/2019. -Weaned off BiPAP now respiratory status stable.  Continue fluid removal as per  hemodialysis.  2. Intentional drug overdose -patient overdosed on 38 tablets of Phenergan, 19 tablets of losartan, 42 tablets of Norvasc and 78 tablets of Klonopin -Urine tox positive for benzos and tricyclics -Appreciate psych consult.  Once patient is medically stable, will need inpatient psychiatry admission.  3.  Acute renal  failure-likely ATN from hypotension.  -Initially patient was on CRRT but now has been weaned over to hemodialysis. -Status post PermCath today.  Continue dialysis as per nephrology.  4. Pneumonia- resolved and off abx now.   5. Anemia due to anemia of chronic disease  - Hg stable and will cont. To monitor.  - no need for transfusion.   6.  GERD-Protonix  7.  Elevated troponin-likely demand ischemia-  Most recent echo this admission showing normal LV function and no wall motion abnormalities.  Transfer to floor and will need PT eval.     All the records are reviewed and case discussed with Care Management/Social Worker. Management plans discussed with the patient, family and they are in agreement.  CODE STATUS: Full code  DVT Prophylaxis: Hep. sQ  TOTAL TIME TAKING CARE OF THIS PATIENT: 30 minutes.   POSSIBLE D/C IN 3-4 DAYS, DEPENDING ON CLINICAL CONDITION.   Henreitta Leber M.D on 03/26/2019 at 3:44 PM  Between 7am to 6pm - Pager - [phone]  After 6pm go to [email or web address] - Proofreader  [facility]  Office  [phone]  CC: Primary care physician; Maeola Sarah, MD

## 2019-03-26 NOTE — Progress Notes (Signed)
[facility]  ROUNDING NOTE   Subjective:   Permcath placed earlier today.   Hemodialysis treatment yesterday. Tolerated treatment well.   Objective:  Vital signs in last 24 hours:  Temp:  [97.5 F (36.4 C)-99.6 F (37.6 C)] 97.5 F (36.4 C) (06/18 1659) Pulse Rate:  [70-95] 75 (06/18 1659) Resp:  [13-34] 21 (06/18 1000) BP: (104-128)/(59-83) 114/70 (06/18 1659) SpO2:  [94 %-100 %] 97 % (06/18 1659) FiO2 (%):  [40 %] 40 % (06/18 0300) Weight:  [74.5 kg-90.7 kg] 90.7 kg (06/18 0739)  Weight change: 0 kg Filed Weights   03/25/19 1723 03/26/19 0133 03/26/19 0739  Weight: 74.5 kg 78 kg 90.7 kg    Intake/Output: I/O last 3 completed shifts: In: 1131.5 [P.O.:1000; I.V.:61.5; Other:20; IV Piggyback:50] Out: 7408 [Urine:3265; Other:4000]   Intake/Output this shift:  Total I/O In: 1200 [P.O.:720; NG/GT:480] Out: 250 [Urine:250]  Physical Exam: General: ill  Head: [place]/AT  Eyes: Anicteric, PERRL  Neck: Trachea midline  Lungs:  Bilateral wheezing  Heart: tachycardia  Abdomen:  +distended, + hypoactive bowel sounds.   Extremities:  no peripheral edema.  Neurologic: Nonfocal, moving all four extremities  Skin: No lesions  Access: RIJ permcath 1/44    Basic Metabolic Panel: Recent Labs  Lab 03/19/19 1930  03/21/19 1100 03/22/19 0452 03/23/19 0423 03/24/19 0446 03/25/19 0512 03/26/19 0503  NA  --    < >  --  141 143 145 152* 144  K  --    < >  --  5.1 4.9 4.9 4.4 3.7  CL  --    < >  --  95* 99 102 109 101  CO2  --    < >  --  26 24 25 23 27   GLUCOSE  --    < >  --  172* 144* 124* 136* 116*  BUN  --    < >  --  99* 141* 99* 129* 85*  CREATININE  --    < >  --  5.61* 6.86* 5.04* 5.60* 4.28*  CALCIUM  --    < >  --  8.0* 7.7* 8.3* 8.7* 9.2  MG  --    < >  --  2.7* 2.5* 2.5* 2.7* 2.5*  PHOS 9.0*  --  11.8* 9.1* 9.2*  --  8.7*  --    < > = values in this interval not displayed.    Liver Function Tests: Recent Labs  Lab 03/23/19 0423 03/25/19 0512   AST 40  --   ALT 60*  --   ALKPHOS 97  --   BILITOT 0.8  --   PROT 6.4*  --   ALBUMIN 3.9 3.9   No results for input(s): LIPASE, AMYLASE in the last 168 hours. No results for input(s): AMMONIA in the last 168 hours.  CBC: Recent Labs  Lab 03/20/19 0343 03/21/19 0349 03/22/19 0452 03/23/19 0423 03/25/19 0512 03/26/19 0503  WBC 14.5* 12.6* 10.8* 14.6* 13.1* 16.6*  NEUTROABS 13.4* 11.4* 9.4*  --   --  12.3*  HGB 6.0* 6.2* 7.8* 7.5* 7.3* 8.7*  HCT 18.9* 19.0* 24.0* 22.6* 22.3* 26.2*  MCV 85.5 84.1 82.5 82.2 83.5 83.7  PLT 333 373 453* 394 375 390    Cardiac Enzymes: Recent Labs  Lab 03/24/19 1415  TROPONINI 0.06*    BNP: Invalid input(s): POCBNP  CBG: Recent Labs  Lab 03/26/19 0518 03/26/19 0756 03/26/19 0924 03/26/19 1139 03/26/19 1651  GLUCAP 126* 106* 97 114* 166*  Microbiology: Results for orders placed or performed during the hospital encounter of 03/08/19  SARS Coronavirus 2 (CEPHEID - Performed in [facility] hospital lab), Hosp Order     Status: None   Collection Time: 03/08/19 11:48 PM   Specimen: Nasopharyngeal Swab  Result Value Ref Range Status   SARS Coronavirus 2 NEGATIVE NEGATIVE Final    Comment: (NOTE) If result is NEGATIVE SARS-CoV-2 target nucleic acids are NOT DETECTED. The SARS-CoV-2 RNA is generally detectable in upper and lower  respiratory specimens during the acute phase of infection. The lowest  concentration of SARS-CoV-2 viral copies this assay can detect is 250  copies / mL. A negative result does not preclude SARS-CoV-2 infection  and should not be used as the sole basis for treatment or other  patient management decisions.  A negative result may occur with  improper specimen collection / handling, submission of specimen other  than nasopharyngeal swab, presence of viral mutation(s) within the  areas targeted by this assay, and inadequate number of viral copies  (<250 copies / mL). A negative result must be combined with  clinical  observations, patient history, and epidemiological information. If result is POSITIVE SARS-CoV-2 target nucleic acids are DETECTED. The SARS-CoV-2 RNA is generally detectable in upper and lower  respiratory specimens dur ing the acute phase of infection.  Positive  results are indicative of active infection with SARS-CoV-2.  Clinical  correlation with patient history and other diagnostic information is  necessary to determine patient infection status.  Positive results do  not rule out bacterial infection or co-infection with other viruses. If result is PRESUMPTIVE POSTIVE SARS-CoV-2 nucleic acids MAY BE PRESENT.   A presumptive positive result was obtained on the submitted specimen  and confirmed on repeat testing.  While 2019 novel coronavirus  (SARS-CoV-2) nucleic acids may be present in the submitted sample  additional confirmatory testing may be necessary for epidemiological  and / or clinical management purposes  to differentiate between  SARS-CoV-2 and other Sarbecovirus currently known to infect humans.  If clinically indicated additional testing with an alternate test  methodology [phone]) is advised. The SARS-CoV-2 RNA is generally  detectable in upper and lower respiratory sp ecimens during the acute  phase of infection. The expected result is Negative. Fact Sheet for Patients:  StrictlyIdeas.no Fact Sheet for Healthcare Providers: BankingDealers.co.za This test is not yet approved or cleared by the [place] FDA and has been authorized for detection and/or diagnosis of SARS-CoV-2 by FDA under an Emergency Use Authorization (EUA).  This EUA will remain in effect (meaning this test can be used) for the duration of the COVID-19 declaration under Section 564(b)(1) of the Act, 21 U.S.C. section 360bbb-3(b)(1), unless the authorization is terminated or revoked sooner. Performed at [facility], [address] [place], [place] [address]   MRSA PCR Screening     Status: None   Collection Time: 03/09/19  2:48 AM   Specimen: Nasal Mucosa; Nasopharyngeal  Result Value Ref Range Status   MRSA by PCR NEGATIVE NEGATIVE Final    Comment:        The GeneXpert MRSA Assay (FDA approved for NASAL specimens only), is one component of a comprehensive MRSA colonization surveillance program. It is not intended to diagnose MRSA infection nor to guide or monitor treatment for MRSA infections. Performed at [facility], [address], [place], [place] [address]   Culture, respiratory (non-expectorated)     Status: None   Collection Time: 03/09/19  8:00  AM   Specimen: Tracheal Aspirate; Respiratory  Result Value Ref Range Status   Specimen Description   Final    TRACHEAL ASPIRATE Performed at [facility], [address] [place], [place] [address]    Special Requests   Final    NONE Performed at [facility], [address] [place], [place] [address]    Gram Stain   Final    ABUNDANT WBC PRESENT,BOTH PMN AND MONONUCLEAR MODERATE GRAM POSITIVE COCCI RARE YEAST    Culture   Final    MODERATE GROUP B STREP(S.AGALACTIAE)ISOLATED TESTING AGAINST S. AGALACTIAE NOT ROUTINELY PERFORMED DUE TO PREDICTABILITY OF AMP/PEN/VAN SUSCEPTIBILITY. Performed at [facility], [place] [address] [place], [place] [address]    Report Status 03/11/2019 FINAL  Final  CULTURE, BLOOD (ROUTINE X 2) w Reflex to ID Panel     Status: None   Collection Time: 03/09/19  2:53 PM   Specimen: BLOOD  Result Value Ref Range Status   Specimen Description   Final    BLOOD BLOOD RIGHT ARM Performed at [facility], [address], [place], [place] [address]    Special Requests   Final    BOTTLES DRAWN AEROBIC AND ANAEROBIC Blood Culture results may not be optimal due to an excessive volume of blood received in culture bottles Performed at [facility], [address] [place], [place] [address]    Culture   Final    NO GROWTH 5 DAYS Performed at [facility], [place] [address], [place], [place] [address]    Report Status 03/17/2019 FINAL  Final  CULTURE, BLOOD (ROUTINE X 2) w Reflex to ID Panel     Status: None   Collection Time: 03/09/19  5:00 PM   Specimen: BLOOD RIGHT ARM  Result Value Ref Range Status   Specimen Description   Final    BLOOD RIGHT ARM Performed at [facility], [address] [place], [place] [address]    Special Requests   Final    BOTTLES DRAWN AEROBIC AND ANAEROBIC Blood Culture results may not be optimal due to an excessive volume of blood received in culture bottles Performed at [facility], [address], [place], [place] [address]    Culture   Final    NO GROWTH 5 DAYS Performed at [facility], [place] [address] [place], [place] [address]    Report Status 03/15/2019 FINAL  Final  Urine Culture     Status: None   Collection Time: 03/11/19  4:33 AM   Specimen: Nasal Mucosa; Urine  Result Value Ref Range Status   Specimen Description   Final    URINE, RANDOM Performed at [facility], [address] [place], [place] [address]    Special Requests   Final    NONE Performed at [facility], [address], [place], [place] [address]    Culture   Final    NO GROWTH Performed at [facility], [place] [address], [place], [place] [address]    Report Status 03/12/2019 FINAL  Final    Coagulation Studies: No results for input(s): LABPROT, INR in the last 72 hours.  Urinalysis: No results for input(s): COLORURINE, LABSPEC, PHURINE, GLUCOSEU, HGBUR, BILIRUBINUR, KETONESUR, PROTEINUR, UROBILINOGEN, NITRITE, LEUKOCYTESUR in the last 72 hours.  Invalid input(s): APPERANCEUR    Imaging: No results found.   Medications:   . sodium chloride Stopped (03/18/19 1430)   . aspirin EC  325 mg  Oral Daily  . budesonide (PULMICORT) nebulizer solution  0.5 mg  Nebulization BID  . Chlorhexidine Gluconate Cloth  6 each Topical Q0600  . feeding supplement (NEPRO CARB STEADY)  237 mL Oral BID BM  . heparin      . heparin injection (subcutaneous)  5,000 Units Subcutaneous Q8H  . insulin aspart  0-9 Units Subcutaneous TID AC & HS  . ipratropium-albuterol  3 mL Nebulization Q4H  . lidocaine-EPINEPHrine      . multivitamin  1 tablet Oral QHS  . pantoprazole  40 mg Oral QHS  . QUEtiapine  100 mg Oral QPM  . sodium chloride flush  10-40 mL Intracatheter Q12H  . venlafaxine XR  150 mg Oral Q breakfast   sodium chloride, bisacodyl, haloperidol lactate, HYDROmorphone (DILAUDID) injection, ipratropium-albuterol, LORazepam, ondansetron (ZOFRAN) IV, polyethylene glycol, QUEtiapine, sodium chloride flush  Assessment/ Plan:  Mr. Glenn Rasmussen is a 28 y.o. white male with hypertension, irritable bowel syndrome, depression with overdose. Now with acute renal failure requiring hemodialysis.   1. Acute renal failure: Nonoliguric urine output.   Creatinine on admission 1.26, normal GFR Due to uremia, hypernatremia, and worsening creatinine - will schedule hemodialysis for tomorrow.  Nonoliguric urine output.  - Hold diuretics.   2. Anemia with renal failure:   - EPO with HD treatment MWF.    LOS: 17 Edilberto [place] 6/18/20205:16 PM

## 2019-03-26 NOTE — Evaluation (Signed)
Physical Therapy Evaluation Patient Details Name: Glenn Rasmussen MRN: [id number] DOB: Jul 17, 1991 Today's Date: 03/26/2019   History of Present Illness  Glenn Rasmussen is a 25yoM with a past medical history of irritable bowel syndrome, hypertension and major depressive disorder who presented to the [facility] emergency department on Mar 08, 2019 after a suicide attempt.  At the time, the patient admitted taking 38 Phenergan, 19 losartan, 42 amlodipine, 78 Klonopin pills.  The patient states that he felt at that time "he did not want to go on living".  The patient had texted a friend to say "goodbye" who had then called EMS who brought him [facility] emergency department emergently for care.  Upon arrival to the ICU from the emergency department the patient became agitated requiring Precedex.  He had multiple episodes of emesis with overt aspiration and was emergently intubated to protect his airway. Creatinine upon admission was 1.26 which had been increased to 3.38 in less than 24 hours.  With continued decline in renal function a temporary dialysis catheter was placed and the patient was initiated on hemodialysis. Pt moved to permacath placement on 6/18.  Clinical Impression  Pt admitted with above diagnosis. Pt currently with functional limitations due to the deficits listed below (see "PT Problem List"). Upon entry, pt in bed, awake and agreeable to participate, safety sitting in room. The pt is alert and oriented x3, pleasant, conversational, and generally a good historian. Heavy effort for bed mobility and transfers, modA for STS and weight shift/support for stepping over to chair. Balance at EOB is difficult with trunk weakness and ataxia noted. Functional mobility assessment demonstrates increased effort/time requirements, poor tolerance, and need for physical assistance, whereas the patient performed these at a higher level of independence PTA.  Anticipate extensive rehab needs with rapid progression over 4-8 weeks. Pt is a good CIR candidate d/t high PLOF and high motivation to return to recovery function. Pt will benefit from skilled PT intervention to increase independence and safety with basic mobility in preparation for discharge to the venue listed below.       Follow Up Recommendations CIR;Supervision for mobility/OOB    Equipment Recommendations       Recommendations for Other Services OT consult     Precautions / Restrictions Precautions Precautions: Fall;Other (comment) Precaution Comments: suicide precautions      Mobility  Bed Mobility Overal bed mobility: Needs Assistance Bed Mobility: Supine to Sit     Supine to sit: Supervision     General bed mobility comments: first time up in 17 days: difficulty with trunk control upon sitting EOB needs min-modA iniitally.  Transfers Overall transfer level: Needs assistance Equipment used: 2 person hand held assist Transfers: Sit to/from [facility] Sit to Stand: Mod assist Stand pivot transfers: Mod assist(heavy assist needed for weight shifting and stability 2/2 'rubbery legs', difficulty with progressing RLE during stepping;)          Ambulation/Gait Ambulation/Gait assistance: (not attempted at this time, pt very weak and unsteady)              Stairs            Wheelchair Mobility    Modified Rankin (Stroke Patients Only)       Balance Overall balance assessment: Needs assistance Sitting-balance support: Bilateral upper extremity supported;Feet supported Sitting balance-Leahy Scale: Poor     Standing balance support: Bilateral upper extremity supported;During functional activity Standing balance-Leahy Scale: Poor  Pertinent Vitals/Pain Pain Assessment: No/denies pain    Home Living Family/patient expects to be discharged to:: Private residence Living Arrangements:  Alone Available Help at Discharge: (father lives 40 minutes away in [facility]. Has church friends nearby who might be able to help.) Type of Home: Apartment Home Access: Stairs to enter   Technical brewer of Steps: 1 Home Layout: One level Home Equipment: None      Prior Function                 Hand Dominance        Extremity/Trunk Assessment   Upper Extremity Assessment Upper Extremity Assessment: Generalized weakness    Lower Extremity Assessment Lower Extremity Assessment: Generalized weakness    Cervical / Trunk Assessment Cervical / Trunk Assessment: Normal  Communication      Cognition Arousal/Alertness: Awake/alert Behavior During Therapy: WFL for tasks assessed/performed Overall Cognitive Status: Within Functional Limits for tasks assessed                                 General Comments: some mild difficulty following commands for exercise and mobility.      General Comments      Exercises Total Joint Exercises Marching in Standing: 10 reps;Both;Seated;AROM General Exercises - Lower Extremity Long Arc Quad: AROM;Both;10 reps;Seated   Assessment/Plan    PT Assessment Patient needs continued PT services  PT Problem List Decreased strength;Decreased activity tolerance;Decreased balance;Decreased mobility;Decreased knowledge of precautions       PT Treatment Interventions DME instruction;Gait training;Therapeutic exercise;Functional mobility training;Therapeutic activities;Patient/family education;Balance training    PT Goals (Current goals can be found in the Care Plan section)  Acute Rehab PT Goals Patient Stated Goal: regain strength and be able to urinate PT Goal Formulation: With patient Time For Goal Achievement: 04/09/19 Potential to Achieve Goals: Fair    Frequency Min 2X/week   Barriers to discharge Decreased caregiver support      Co-evaluation               AM-PAC PT "6 Clicks" Mobility   Outcome Measure Help needed turning from your back to your side while in a flat bed without using bedrails?: A Little Help needed moving from lying on your back to sitting on the side of a flat bed without using bedrails?: A Little Help needed moving to and from a bed to a chair (including a wheelchair)?: A Little Help needed standing up from a chair using your arms (e.g., wheelchair or bedside chair)?: A Lot Help needed to walk in hospital room?: A Lot Help needed climbing 3-5 steps with a railing? : A Lot 6 Click Score: 15    End of Session Equipment Utilized During Treatment: Gait belt;Oxygen Activity Tolerance: Patient tolerated treatment well;Patient limited by fatigue Patient left: in chair;with call bell/phone within reach;with nursing/sitter in room Nurse Communication: Mobility status(tolerating 3L via [place]) PT Visit Diagnosis: Unsteadiness on feet (R26.81);Other abnormalities of gait and mobility (R26.89);Muscle weakness (generalized) (M62.81)    Time: 5374-8270 PT Time Calculation (min) (ACUTE ONLY): 21 min   Charges:   PT Evaluation $PT Eval Moderate Complexity: 1 Mod PT Treatments $Therapeutic Exercise: 8-22 mins        7:24 PM, 03/26/19 Etta Grandchild, PT, DPT Physical Therapist - [facility]  [phone] ([place])    Burak Zerbe C 03/26/2019, 7:22 PM

## 2019-03-26 NOTE — Consult Note (Signed)
[facility] Face-to-Face Psychiatry Consult follow-up  Reason for Consult: Suicide attempt by overdose Referring Physician: ICU provider Patient Identification: ELIAH MARQUARD MRN:  [id number] Principal Diagnosis: Multiple drug overdose Diagnosis:  Principal Problem:   Multiple drug overdose Active Problems:   Suicide attempt ([place])   HTN (hypertension)   AKI (acute kidney injury) ([place])   Acute respiratory failure ([place])   Antihypertensive agent overdose   Calcium channel blocker overdose   MDD (major depressive disorder), recurrent severe, without psychosis ([place])   Intentional benzodiazepine overdose ([place])  Patient is seen, chart is reviewed.   Patient has provided permission to speak with mother, [name] [phone]) Total Time spent with patient: 35 minutes  Subjective: "I'm depressed about why I am here."   03/26/2019 On evaluation today, patient has been moved from ICU to medical stepdown unit.  Patient feels optimistic that he is improving, however has an overwhelming sense of remorse about his suicide attempt that caused him to be in this position.  He is tearful during evaluation, and states that "prior to my overdose I was just so tired.  I have seen so much trauma."  Patient denies that he is ever sought treatment for his past traumas.  He reports that he has nightmares and flashbacks.  He had previously tried trazodone which was not helpful for sleep.  He states that he has not found mirtazapine to be helpful for sleep, and notes that he felt his depression was somewhat better when taking venlafaxine, however he had required higher than recommended doses.  Patient appears to prefer to keep conversation superficial, he shuts down with probing related to events leading up to suicide attempt and past traumas.  Patient describes ruminations that keep him from sleeping at night.  He requests having medication earlier to "stop the thoughts before at bedtime."   HPI:  NEFTALI ABAIR  is a 28 y.o. male patient admitted with drug overdose and suicide attempt.  Patient presents to the ED reporting multiple drug overdose.  He states that he is depressed and "just does not care about life anymore".  He states that he took a number of medications tonight including benzodiazepine, calcium channel blocker, Phenergan, amlodipine, and "just did not care what happened".  He is currently hypotensive, though awake and alert.  Treatment for his overdose was initiated in the ED and hospitalist were called for admission. Patient has been receiving dialysis treatments.  Early this morning 03/17/2019, patient had acute respiratory distress and abdominal pain and was returned to the intensive care unit.  Patient is now receiving ventilatory support, and has an NG tube in place to suction.  Surgical consultation is pending.  Prognosis remains guarded, patient may have end-stage renal disease.  Psychiatry consult is requested for evaluation of ongoing suicidal ideation and treatment of depression.  Past Psychiatric History: Per medication review, patient appears to be treated for ADHD, anxiety, and insomnia. Patient reports being on multiple antidepressants in the past, and states, "none of them worked for me.  They all made me feel sick to my stomach and caused me to vomit."  Risk to Self:  Yes Risk to Others:  Not known Prior Inpatient Therapy:  Not known Prior Outpatient Therapy:  Not known   Past Medical History:  Past Medical History:  Diagnosis Date  . Hypertension   . IBS (irritable bowel syndrome)     Past Surgical History:  Procedure Laterality Date  . APPENDECTOMY    . CHOLECYSTECTOMY    .  TONSILLECTOMY     Family History:  Family History  Problem Relation Age of Onset  . Lung cancer Mother   . Lung cancer Father    Family Psychiatric  History: Unknown  Social History:  Social History   Substance and Sexual Activity  Alcohol Use No  . Frequency: Never     Social  History   Substance and Sexual Activity  Drug Use No    Social History   Socioeconomic History  . Marital status: Single    Spouse name: Not on file  . Number of children: Not on file  . Years of education: Not on file  . Highest education level: Not on file  Occupational History  . Not on file  Social Needs  . Financial resource strain: Not on file  . Food insecurity    Worry: Not on file    Inability: Not on file  . Transportation needs    Medical: Not on file    Non-medical: Not on file  Tobacco Use  . Smoking status: Never Smoker  . Smokeless tobacco: Never Used  Substance and Sexual Activity  . Alcohol use: No    Frequency: Never  . Drug use: No  . Sexual activity: Not on file  Lifestyle  . Physical activity    Days per week: Not on file    Minutes per session: Not on file  . Stress: Not on file  Relationships  . Social Herbalist on phone: Not on file    Gets together: Not on file    Attends religious service: Not on file    Active member of club or organization: Not on file    Attends meetings of clubs or organizations: Not on file    Relationship status: Not on file  Other Topics Concern  . Not on file  Social History Narrative  . Not on file   Additional Social History:    Patient lives alone.  He reports his plan for discharge is to return to living independently.  Allergies:  No Known Allergies  Labs:  Results for orders placed or performed during the hospital encounter of 03/08/19 (from the past 48 hour(s))  Glucose, capillary     Status: Abnormal   Collection Time: 03/24/19 11:50 PM  Result Value Ref Range   Glucose-Capillary 110 (H) 70 - 99 mg/dL   Comment 1 Notify RN   Magnesium     Status: Abnormal   Collection Time: 03/25/19  5:12 AM  Result Value Ref Range   Magnesium 2.7 (H) 1.7 - 2.4 mg/dL    Comment: Performed at [facility], [address] [place], [place] [address]  Renal function panel     Status:  Abnormal   Collection Time: 03/25/19  5:12 AM  Result Value Ref Range   Sodium 152 (H) 135 - 145 mmol/L   Potassium 4.4 3.5 - 5.1 mmol/L   Chloride 109 98 - 111 mmol/L   CO2 23 22 - 32 mmol/L   Glucose, Bld 136 (H) 70 - 99 mg/dL   BUN 129 (H) 6 - 20 mg/dL    Comment: RESULT CONFIRMED BY MANUAL DILUTION. QSD   Creatinine, Ser 5.60 (H) 0.61 - 1.24 mg/dL   Calcium 8.7 (L) 8.9 - 10.3 mg/dL   Phosphorus 8.7 (H) 2.5 - 4.6 mg/dL   Albumin 3.9 3.5 - 5.0 g/dL   GFR calc non Af Amer 13 (L) >60 mL/min   GFR calc Af Amer 15 (  L) >60 mL/min   Anion gap 20 (H) 5 - 15    Comment: Performed at [facility], [address] [place], [place] [address]  CBC     Status: Abnormal   Collection Time: 03/25/19  5:12 AM  Result Value Ref Range   WBC 13.1 (H) 4.0 - 10.5 K/uL   RBC 2.67 (L) 4.22 - 5.81 MIL/uL   Hemoglobin 7.3 (L) 13.0 - 17.0 g/dL   HCT 22.3 (L) 39.0 - 52.0 %   MCV 83.5 80.0 - 100.0 fL   MCH 27.3 26.0 - 34.0 pg   MCHC 32.7 30.0 - 36.0 g/dL   RDW 13.3 11.5 - 15.5 %   Platelets 375 150 - 400 K/uL   nRBC 0.0 0.0 - 0.2 %    Comment: Performed at [facility], [address] [place], [place] [address]  Glucose, capillary     Status: Abnormal   Collection Time: 03/25/19  5:36 AM  Result Value Ref Range   Glucose-Capillary 124 (H) 70 - 99 mg/dL  Glucose, capillary     Status: Abnormal   Collection Time: 03/25/19 11:38 AM  Result Value Ref Range   Glucose-Capillary 207 (H) 70 - 99 mg/dL  Glucose, capillary     Status: Abnormal   Collection Time: 03/25/19  5:41 PM  Result Value Ref Range   Glucose-Capillary 137 (H) 70 - 99 mg/dL  Glucose, capillary     Status: Abnormal   Collection Time: 03/25/19 11:24 PM  Result Value Ref Range   Glucose-Capillary 160 (H) 70 - 99 mg/dL  Magnesium     Status: Abnormal   Collection Time: 03/26/19  5:03 AM  Result Value Ref Range   Magnesium 2.5 (H) 1.7 - 2.4 mg/dL    Comment: Performed at [facility], [address] [place], [place] [address]  Basic metabolic panel     Status: Abnormal   Collection Time: 03/26/19  5:03 AM  Result Value Ref Range   Sodium 144 135 - 145 mmol/L    Comment: RESULTS VERIFIED BY REPEAT TESTING   Potassium 3.7 3.5 - 5.1 mmol/L   Chloride 101 98 - 111 mmol/L   CO2 27 22 - 32 mmol/L   Glucose, Bld 116 (H) 70 - 99 mg/dL   BUN 85 (H) 6 - 20 mg/dL   Creatinine, Ser 4.28 (H) 0.61 - 1.24 mg/dL   Calcium 9.2 8.9 - 10.3 mg/dL   GFR calc non Af Amer 17 (L) >60 mL/min   GFR calc Af Amer 20 (L) >60 mL/min   Anion gap 16 (H) 5 - 15    Comment: Performed at [facility], [address] [place],  [address]  CBC with Differential/Platelet     Status: Abnormal   Collection Time: 03/26/19  5:03 AM  Result Value Ref Range   WBC 16.6 (H) 4.0 - 10.5 K/uL   RBC 3.13 (L) 4.22 - 5.81 MIL/uL   Hemoglobin 8.7 (L) 13.0 - 17.0 g/dL   HCT 26.2 (L) 39.0 - 52.0 %   MCV 83.7 80.0 - 100.0 fL   MCH 27.8 26.0 - 34.0 pg   MCHC 33.2 30.0 - 36.0 g/dL   RDW 13.2 11.5 - 15.5 %   Platelets 390 150 - 400 K/uL   nRBC 0.0 0.0 - 0.2 %   Neutrophils Relative % 74 %   Neutro Abs 12.3 (H) 1.7 - 7.7 K/uL   Lymphocytes Relative 15 %   Lymphs Abs 2.4  0.7 - 4.0 K/uL   Monocytes Relative 6 %   Monocytes Absolute 1.0 0.1 - 1.0 K/uL   Eosinophils Relative 3 %   Eosinophils Absolute 0.5 0.0 - 0.5 K/uL   Basophils Relative 0 %   Basophils Absolute 0.0 0.0 - 0.1 K/uL   Immature Granulocytes 2 %   Abs Immature Granulocytes 0.31 (H) 0.00 - 0.07 K/uL    Comment: Performed at [facility], [address] [place], [place] [address]  Glucose, capillary     Status: Abnormal   Collection Time: 03/26/19  5:18 AM  Result Value Ref Range   Glucose-Capillary 126 (H) 70 - 99 mg/dL  Glucose, capillary     Status: Abnormal   Collection Time: 03/26/19  7:56 AM  Result Value Ref Range   Glucose-Capillary 106 (H) 70 - 99 mg/dL  Glucose, capillary     Status: None   Collection Time: 03/26/19  9:24  AM  Result Value Ref Range   Glucose-Capillary 97 70 - 99 mg/dL  Glucose, capillary     Status: Abnormal   Collection Time: 03/26/19 11:39 AM  Result Value Ref Range   Glucose-Capillary 114 (H) 70 - 99 mg/dL    Current Facility-Administered Medications  Medication Dose Route Frequency Provider Last Rate Last Dose  . 0.9 %  sodium chloride infusion   Intravenous PRN Algernon Huxley, MD   Stopped at 03/18/19 1430  . aspirin EC tablet 325 mg  325 mg Oral Daily Algernon Huxley, MD   325 mg at 03/26/19 1026  . bisacodyl (DULCOLAX) suppository 10 mg  10 mg Rectal Daily PRN Algernon Huxley, MD      . budesonide (PULMICORT) nebulizer solution 0.5 mg  0.5 mg Nebulization BID Algernon Huxley, MD   0.5 mg at 03/25/19 1915  . Chlorhexidine Gluconate Cloth 2 % PADS 6 each  6 each Topical Q0600 Algernon Huxley, MD   6 each at 03/24/19 2155  . feeding supplement (NEPRO CARB STEADY) liquid 237 mL  237 mL Oral BID BM Algernon Huxley, MD   237 mL at 03/26/19 1423  . haloperidol lactate (HALDOL) injection 2 mg  2 mg Intravenous Q6H PRN Algernon Huxley, MD      . heparin 10000 UNIT/ML injection           . heparin injection 5,000 Units  5,000 Units Subcutaneous Q8H Algernon Huxley, MD   5,000 Units at 03/26/19 1420  . HYDROmorphone (DILAUDID) injection 1 mg  1 mg Intravenous Once PRN Algernon Huxley, MD      . insulin aspart (novoLOG) injection 0-9 Units  0-9 Units Subcutaneous TID AC & HS Dew, Erskine Squibb, MD      . ipratropium-albuterol (DUONEB) 0.5-2.5 (3) MG/3ML nebulizer solution 3 mL  3 mL Nebulization Q6H PRN Algernon Huxley, MD   3 mL at 03/17/19 0818  . ipratropium-albuterol (DUONEB) 0.5-2.5 (3) MG/3ML nebulizer solution 3 mL  3 mL Nebulization Q4H Algernon Huxley, MD   3 mL at 03/26/19 1155  . lidocaine-EPINEPHrine (XYLOCAINE-EPINEPHrine) 1 %-1:200000 (PF) injection           . LORazepam (ATIVAN) injection 2-4 mg  2-4 mg Intravenous Q1H PRN Algernon Huxley, MD   2 mg at 03/26/19 0148  . mirtazapine (REMERON SOL-TAB) disintegrating tablet  15 mg  15 mg Oral QHS Algernon Huxley, MD   15 mg at 03/25/19 2157  . multivitamin (RENA-VIT) tablet 1 tablet  1 tablet  Oral QHS Algernon Huxley, MD   1 tablet at 03/25/19 2157  . ondansetron (ZOFRAN) injection 4 mg  4 mg Intravenous Q6H PRN Algernon Huxley, MD      . pantoprazole (PROTONIX) EC tablet 40 mg  40 mg Oral QHS Algernon Huxley, MD   40 mg at 03/25/19 2157  . polyethylene glycol (MIRALAX / GLYCOLAX) packet 17 g  17 g Per Tube Daily PRN Algernon Huxley, MD      . sodium chloride flush (NS) 0.9 % injection 10-40 mL  10-40 mL Intracatheter Q12H Algernon Huxley, MD   10 mL at 03/25/19 2201  . sodium chloride flush (NS) 0.9 % injection 10-40 mL  10-40 mL Intracatheter PRN Dew, Erskine Squibb, MD        Musculoskeletal: Strength & Muscle Tone: decreased Gait & Station: Not assessed Patient leans: N/A  Psychiatric Specialty Exam: Physical Exam  Nursing note and vitals reviewed. Constitutional: He is oriented to person, place, and time. He appears well-developed and well-nourished. No distress.  HENT:  Head: Normocephalic and atraumatic.  Eyes: EOM are normal.  Neck: Normal range of motion.  Cardiovascular: Regular rhythm.  Tachycardia  Respiratory:  Labored breathing on high flow oxygen via nasal cannula  Musculoskeletal: Normal range of motion.  Neurological: He is alert and oriented to person, place, and time.    Review of Systems  Constitutional: Positive for malaise/fatigue.  Respiratory: Positive for shortness of breath.   Cardiovascular: Negative.  Negative for chest pain and palpitations.  Gastrointestinal: Positive for abdominal pain, constipation and nausea.  Musculoskeletal: Positive for myalgias.  Neurological: Positive for weakness.  Psychiatric/Behavioral: Positive for depression. Negative for hallucinations, substance abuse and suicidal ideas. The patient is nervous/anxious. The patient does not have insomnia.     Blood pressure 105/70, pulse 80, temperature 98.8 F (37.1 C),  temperature source Axillary, resp. rate (!) 21, height 5\' 7"  (1.702 m), weight 90.7 kg, SpO2 96 %.Body mass index is 31.32 kg/m.  General Appearance: Fairly Groomed  Eye Contact:  Good  Speech:  Clear and Coherent  Volume:  Decreased  Mood:  Anxious and Depressed  Affect:  Depressed and Tearful  Thought Process:  Coherent  Orientation:  Full (Time, Place, and Person)  Thought Content:  Hallucinations: None  Suicidal Thoughts:  Denies currently, following suicide attempt by overdose  Homicidal Thoughts:  No  Memory:  NA  Judgement:  Poor  Insight:  Present and Shallow  Psychomotor Activity:  Decreased  Concentration:  Concentration: Fair  Recall:  [place] of Knowledge:  Good  Language:  Fair  Akathisia:  No  Handed:  Right  AIMS (if indicated):     Assets:  Communication Skills Desire for Improvement Social Support patient has given consent to talk to mother, [name] [phone]  ADL's:  Impaired  Cognition:  WNL  Sleep:   decreased     Treatment Plan Summary: Daily contact with patient to assess and evaluate symptoms and progress in treatment and Medication management  Discontinue Remeron SolTab 15 mg at bedtime for depression and anxiety. Start venlafaxine X are 150 mg daily for depression and anxiety. Start Seroquel 100 mg at 8 PM for ruminations.  Patient may repeat 100 mg at bedtime as needed for ongoing ruminations/anxiety or sleep  Disposition: Recommend psychiatric Inpatient admission when medically cleared. Supportive therapy provided about ongoing stressors.  patient has given consent to talk to mother, [name] [phone]  Psychiatry will continue to follow and  advise medication management as indicated.  Lavella Hammock, MD 03/26/2019 4:31 PM

## 2019-03-26 NOTE — Progress Notes (Signed)
Speech Therapy note: reviewed chart notes; consulted pt, NSG and Sitter in room. When pt returned from his procedure this morning, MD placed an upgraded diet consistency in orders for pt(regular consistency w/ thin liquids). NSG reported pt had tolerated the minced diet s/p eval yesterday, and that pt had fed self at the Lunch meal today tolerating the regular consistency foods adequately. Sitter denied any overt s/s of aspiration; pt himself denied any difficulty masticating or swallowing the solid food consistency; he also denied any coughing or choking w/ liquids/foods.  As pt's diet has been upgraded in consistency to a recommended consistency, and pt is tolerating the diet well, per NSG staff and pt, no further skilled ST services indicated at this time. Recommend continue ongoing aspiration precautions for support including Pills in puree as needed for easier swallowing.  NSG to reconsult if any decline in status while admitted. NSG agreed. Pt may be transferring to new floor today.     Orinda Kenner, [place], CCC-SLP

## 2019-03-26 NOTE — H&P (Signed)
[facility] History & Physical Update  The patient was interviewed and re-examined.  The patient's previous History and Physical has been reviewed and is unchanged.  There is no change in the plan of care. We plan to proceed with the scheduled procedure.  Leotis Pain, MD  03/26/2019, 8:36 AM

## 2019-03-27 ENCOUNTER — Encounter: Payer: Self-pay | Admitting: Vascular Surgery

## 2019-03-27 LAB — CBC WITH DIFFERENTIAL/PLATELET
Abs Immature Granulocytes: 0.23 10*3/uL — ABNORMAL HIGH (ref 0.00–0.07)
Basophils Absolute: 0 10*3/uL (ref 0.0–0.1)
Basophils Relative: 0 %
Eosinophils Absolute: 0.9 10*3/uL — ABNORMAL HIGH (ref 0.0–0.5)
Eosinophils Relative: 6 %
HCT: 26.7 % — ABNORMAL LOW (ref 39.0–52.0)
Hemoglobin: 9 g/dL — ABNORMAL LOW (ref 13.0–17.0)
Immature Granulocytes: 2 %
Lymphocytes Relative: 21 %
Lymphs Abs: 3 10*3/uL (ref 0.7–4.0)
MCH: 27.3 pg (ref 26.0–34.0)
MCHC: 33.7 g/dL (ref 30.0–36.0)
MCV: 80.9 fL (ref 80.0–100.0)
Monocytes Absolute: 1.1 10*3/uL — ABNORMAL HIGH (ref 0.1–1.0)
Monocytes Relative: 8 %
Neutro Abs: 8.7 10*3/uL — ABNORMAL HIGH (ref 1.7–7.7)
Neutrophils Relative %: 63 %
Platelets: 362 10*3/uL (ref 150–400)
RBC: 3.3 MIL/uL — ABNORMAL LOW (ref 4.22–5.81)
RDW: 12.9 % (ref 11.5–15.5)
WBC: 13.9 10*3/uL — ABNORMAL HIGH (ref 4.0–10.5)
nRBC: 0 % (ref 0.0–0.2)

## 2019-03-27 LAB — BASIC METABOLIC PANEL
Anion gap: 14 (ref 5–15)
BUN: 88 mg/dL — ABNORMAL HIGH (ref 6–20)
CO2: 24 mmol/L (ref 22–32)
Calcium: 8.8 mg/dL — ABNORMAL LOW (ref 8.9–10.3)
Chloride: 95 mmol/L — ABNORMAL LOW (ref 98–111)
Creatinine, Ser: 3.65 mg/dL — ABNORMAL HIGH (ref 0.61–1.24)
GFR calc Af Amer: 24 mL/min — ABNORMAL LOW (ref >60)
GFR calc non Af Amer: 21 mL/min — ABNORMAL LOW (ref >60)
Glucose, Bld: 97 mg/dL (ref 70–99)
Potassium: 3.6 mmol/L (ref 3.5–5.1)
Sodium: 133 mmol/L — ABNORMAL LOW (ref 135–145)

## 2019-03-27 LAB — GLUCOSE, CAPILLARY
Glucose-Capillary: 90 mg/dL (ref 70–99)
Glucose-Capillary: 103 mg/dL — ABNORMAL HIGH (ref 70–99)
Glucose-Capillary: 117 mg/dL — ABNORMAL HIGH (ref 70–99)

## 2019-03-27 LAB — MAGNESIUM: Magnesium: 1.9 mg/dL (ref 1.7–2.4)

## 2019-03-27 MED ORDER — IPRATROPIUM-ALBUTEROL 0.5-2.5 (3) MG/3ML IN SOLN
3.0000 mL | Freq: Three times a day (TID) | RESPIRATORY_TRACT | Status: DC
  Administered 2019-03-27 – 2019-03-28 (×2): 3 mL via RESPIRATORY_TRACT
  Filled 2019-03-27 (×2): qty 3

## 2019-03-27 NOTE — Progress Notes (Signed)
   03/27/19 1500  Clinical Encounter Type  Visited With Patient;Other (Comment)  Visit Type Follow-up;Spiritual support  Referral From Chaplain  Consult/Referral To Chaplain  Spiritual Encounters  Spiritual Needs Prayer;Emotional;Grief support  Stress Factors  Patient Stress Factors Loss of control;Major life changes  Chaplain visit patient and patient was sitting on the side of the bed. The nurses were changing shifts and the nurse invited chaplain to come in. Chaplain said "Hey Buddy you look good to me" the nurse stated "that is why we said come on in, there is a party going on". Patient had the biggest smile on his face. Chaplain express to patient how he had so many people support his progress. Chaplain encourage patient to use this to be his goal to continue to feel good about his overcoming those obstacles, of how he felt he was a failure. People do not celebrate failures. Chaplain help patient process his thoughts of guilt and shame and how he can forgive hisself through his faith in God. Patient relationship with God is built on his peace of him taking a overdose, so Chaplain help patient look at that as forgiving himself as he believes God forgives him. Chaplain gave patient a prayer shawl as a reminder of the forgiveness that he has received today and every time he feels discouraged he can be reminded he forgave himself and he does not have to hold that space any longer. Chaplin had patient pray and then she prayed and patient said "I feel so much better".

## 2019-03-27 NOTE — Progress Notes (Signed)
HD Tx End  Pt removed 789mL of fluid.   8 beat run of Vtach on monitor, with pt experiencing symptoms of "fluttering" in chest and dizziness with 45 mins left in tx. UF turned off early in tx d/t the c/o dizziness, and blood flow rate reduced (with Dr. Juleen China @ bedside) during episode of Vtach. Subsided with decrease in blood flow rate with UF off. Tx still ends 15 mins early per MD d/t the event.     03/27/19 1300  Hand-Off documentation  Report given to (Full Name) Serenity RN 2A  Report received from (Full Name) Trellis Paganini RN  Vital Signs  Temp 98.8 F (37.1 C)  Temp Source Oral  Pulse Rate 77  Pulse Rate Source Monitor  Resp (!) 25  BP 118/77  BP Location Left Arm  BP Method Automatic  Patient Position (if appropriate) Lying  Oxygen Therapy  SpO2 97 %  O2 Device Nasal Cannula  O2 Flow Rate (L/min) 2 L/min  Pain Assessment  Pain Scale 0-10  Pain Score 0  Dialysis Weight  Weight  (unable to weigh, pt in chair)  During Hemodialysis Assessment  Blood Flow Rate (mL/min) 200 mL/min  Arterial Pressure (mmHg) -190 mmHg  Venous Pressure (mmHg) 240 mmHg  Transmembrane Pressure (mmHg) 50 mmHg  Ultrafiltration Rate (mL/min) 0 mL/min  Dialysate Flow Rate (mL/min) 600 ml/min  Conductivity: Machine  14  HD Safety Checks Performed Yes  Intra-Hemodialysis Comments Progressing as prescribed (BFR decreased to 200d/t run of Vtach,Tx ends early per MD)  Post-Hemodialysis Assessment  Rinseback Volume (mL) 250 mL  Dialyzer Clearance Lightly streaked  Duration of HD Treatment -hour(s) 2.75 hour(s) (ended early per nephro d/t vtach episode)  Hemodialysis Intake (mL) 500 mL  UF Total -Machine (mL) 1224 mL  Net UF (mL) 724 mL  Hemodialysis Catheter Right Other (Comment) Double lumen Permanent (Tunneled)  Placement Date/Time: 03/26/19 1135   Placed prior to admission: No  Person Inserting Catheter: IR  Orientation: Right  Access Location: (c) Other (Comment)  Hemodialysis Catheter  Type: Double lumen Permanent (Tunneled)  Site Condition No complications  Blue Lumen Status Flushed;Capped (Central line);Heparin locked  Red Lumen Status Flushed;Capped (Central line);Heparin locked  Purple Lumen Status N/A  Catheter fill solution Heparin 1000 units/ml  Dressing Type Biopatch;Occlusive  Dressing Status Clean;Dry;Intact;Dressing changed;Antimicrobial disc changed  Interventions Dressing changed;New dressing  Drainage Description None  Dressing Change Due 04/03/19  Post treatment catheter status Capped and Clamped

## 2019-03-27 NOTE — Progress Notes (Signed)
Ch f/u with pt while making rounds on 2A. Pt was sitting upright in bed and appeared to have just finished eating breakfast. Pt was more alert and presented significant improvement after being transferred from ICU. Pt shared that he was preparing to go to dialysis and showed the ch where his port was placed. Ch shared w/ the pt that she was excited to see how well the pt has improved and would f/u at a later time as the pt prepared for dialysis. Ch checked in w/ pt's nurse to determine if pt would hv to transfer to [facility] unit which the nurse shared that it was not known at this point. Ch is hopeful that bedside companionship would suffice for pt rather than BM placement.  F/u should include attending progressive rounds to hear update on pt and to refer ch's on call to f/u with pt when time permits to provided spiritual support that includes scripture and words of encouragement.    03/27/19 0900  Clinical Encounter Type  Visited With Patient;Health care provider  Visit Type Follow-up  Spiritual Encounters  Spiritual Needs Emotional;Grief support  Stress Factors  Patient Stress Factors Major life changes  Family Stress Factors None identified

## 2019-03-27 NOTE — Progress Notes (Signed)
[facility]  ROUNDING NOTE   Subjective:   Hemodialysis treatment today. Started to have feeling of earfullness and had a run of ventricular tachycardia.   BFR was decreased and ultrafiltration was discontinued.   Sitter at bedside.   Treatment done in chair   HEMODIALYSIS FLOWSHEET:  Blood Flow Rate (mL/min): 200 mL/min Arterial Pressure (mmHg): -190 mmHg Venous Pressure (mmHg): 240 mmHg Transmembrane Pressure (mmHg): 50 mmHg Ultrafiltration Rate (mL/min): 0 mL/min Dialysate Flow Rate (mL/min): 600 ml/min Conductivity: Machine : 14 Conductivity: Machine : 14 Dialysis Fluid Bolus: Normal Saline Bolus Amount (mL): 250 mL    Objective:  Vital signs in last 24 hours:  Temp:  [97.5 F (36.4 C)-98.8 F (37.1 C)] 98.8 F (37.1 C) (06/19 1300) Pulse Rate:  [72-85] 77 (06/19 1300) Resp:  [16-31] 25 (06/19 1300) BP: (105-123)/(68-85) 118/77 (06/19 1300) SpO2:  [95 %-100 %] 97 % (06/19 1300) FiO2 (%):  [3 %] 3 % (06/19 0623) Weight:  [89.9 kg] 89.9 kg (06/19 1015)  Weight change: 12.2 kg Filed Weights   03/26/19 0739 03/27/19 0500 03/27/19 1015  Weight: 90.7 kg 89.9 kg 89.9 kg    Intake/Output: I/O last 3 completed shifts: In: 9892 [P.O.:1200; NG/GT:480; IV Piggyback:50] Out: 475 [Urine:475]   Intake/Output this shift:  Total I/O In: 120 [P.O.:120] Out: 724 [Other:724]  Physical Exam: General: Sitting in a chair  Head: [place]/AT  Eyes: Anicteric, PERRL  Neck: Trachea midline  Lungs:  Bilateral wheezing  Heart: tachycardia  Abdomen:  +distended, + hypoactive bowel sounds.   Extremities:  no peripheral edema.  Neurologic: Nonfocal, moving all four extremities  Skin: No lesions  Access: RIJ permcath 1/19    Basic Metabolic Panel: Recent Labs  Lab 03/21/19 1100 03/22/19 0452 03/23/19 0423 03/24/19 0446 03/25/19 0512 03/26/19 0503 03/27/19 0556  NA  --  141 143 145 152* 144 133*  K  --  5.1 4.9 4.9 4.4 3.7 3.6  CL  --  95* 99 102 109 101  95*  CO2  --  26 24 25 23 27 24   GLUCOSE  --  172* 144* 124* 136* 116* 97  BUN  --  99* 141* 99* 129* 85* 88*  CREATININE  --  5.61* 6.86* 5.04* 5.60* 4.28* 3.65*  CALCIUM  --  8.0* 7.7* 8.3* 8.7* 9.2 8.8*  MG  --  2.7* 2.5* 2.5* 2.7* 2.5* 1.9  PHOS 11.8* 9.1* 9.2*  --  8.7*  --   --     Liver Function Tests: Recent Labs  Lab 03/23/19 0423 03/25/19 0512  AST 40  --   ALT 60*  --   ALKPHOS 97  --   BILITOT 0.8  --   PROT 6.4*  --   ALBUMIN 3.9 3.9   No results for input(s): LIPASE, AMYLASE in the last 168 hours. No results for input(s): AMMONIA in the last 168 hours.  CBC: Recent Labs  Lab 03/21/19 0349 03/22/19 0452 03/23/19 0423 03/25/19 0512 03/26/19 0503 03/27/19 0556  WBC 12.6* 10.8* 14.6* 13.1* 16.6* 13.9*  NEUTROABS 11.4* 9.4*  --   --  12.3* 8.7*  HGB 6.2* 7.8* 7.5* 7.3* 8.7* 9.0*  HCT 19.0* 24.0* 22.6* 22.3* 26.2* 26.7*  MCV 84.1 82.5 82.2 83.5 83.7 80.9  PLT 373 453* 394 375 390 362    Cardiac Enzymes: Recent Labs  Lab 03/24/19 1415  TROPONINI 0.06*    BNP: Invalid input(s): POCBNP  CBG: Recent Labs  Lab 03/26/19 0924 03/26/19 1139 03/26/19 1651 03/26/19  1950 03/27/19 [place]    Microbiology: Results for orders placed or performed during the hospital encounter of 03/08/19  SARS Coronavirus 2 (CEPHEID - Performed in [place] hospital lab), Hosp Order     Status: None   Collection Time: 03/08/19 11:48 PM   Specimen: Nasopharyngeal Swab  Result Value Ref Range Status   SARS Coronavirus 2 NEGATIVE NEGATIVE Final    Comment: (NOTE) If result is NEGATIVE SARS-CoV-2 target nucleic acids are NOT DETECTED. The SARS-CoV-2 RNA is generally detectable in upper and lower  respiratory specimens during the acute phase of infection. The lowest  concentration of SARS-CoV-2 viral copies this assay can detect is 250  copies / mL. A negative result does not preclude SARS-CoV-2 infection  and should not be used as the sole  basis for treatment or other  patient management decisions.  A negative result may occur with  improper specimen collection / handling, submission of specimen other  than nasopharyngeal swab, presence of viral mutation(s) within the  areas targeted by this assay, and inadequate number of viral copies  (<250 copies / mL). A negative result must be combined with clinical  observations, patient history, and epidemiological information. If result is POSITIVE SARS-CoV-2 target nucleic acids are DETECTED. The SARS-CoV-2 RNA is generally detectable in upper and lower  respiratory specimens dur ing the acute phase of infection.  Positive  results are indicative of active infection with SARS-CoV-2.  Clinical  correlation with patient history and other diagnostic information is  necessary to determine patient infection status.  Positive results do  not rule out bacterial infection or co-infection with other viruses. If result is PRESUMPTIVE POSTIVE SARS-CoV-2 nucleic acids MAY BE PRESENT.   A presumptive positive result was obtained on the submitted specimen  and confirmed on repeat testing.  While 2019 novel coronavirus  (SARS-CoV-2) nucleic acids may be present in the submitted sample  additional confirmatory testing may be necessary for epidemiological  and / or clinical management purposes  to differentiate between  SARS-CoV-2 and other Sarbecovirus currently known to infect humans.  If clinically indicated additional testing with an alternate test  methodology [phone]) is advised. The SARS-CoV-2 RNA is generally  detectable in upper and lower respiratory sp ecimens during the acute  phase of infection. The expected result is Negative. Fact Sheet for Patients:  StrictlyIdeas.no Fact Sheet for Healthcare Providers: BankingDealers.co.za This test is not yet approved or cleared by the [place] FDA and has been authorized for detection  and/or diagnosis of SARS-CoV-2 by FDA under an Emergency Use Authorization (EUA).  This EUA will remain in effect (meaning this test can be used) for the duration of the COVID-19 declaration under Section 564(b)(1) of the Act, 21 U.S.C. section 360bbb-3(b)(1), unless the authorization is terminated or revoked sooner. Performed at [facility], [address] [place], [place] [address]   MRSA PCR Screening     Status: None   Collection Time: 03/09/19  2:48 AM   Specimen: Nasal Mucosa; Nasopharyngeal  Result Value Ref Range Status   MRSA by PCR NEGATIVE NEGATIVE Final    Comment:        The GeneXpert MRSA Assay (FDA approved for NASAL specimens only), is one component of a comprehensive MRSA colonization surveillance program. It is not intended to diagnose MRSA infection nor to guide or monitor treatment for MRSA infections. Performed at [facility], [address], [place], [place] [address]   Culture, respiratory (non-expectorated)  Status: None   Collection Time: 03/09/19  8:00 AM   Specimen: Tracheal Aspirate; Respiratory  Result Value Ref Range Status   Specimen Description   Final    TRACHEAL ASPIRATE Performed at [facility], [address] [place], [place] [address]    Special Requests   Final    NONE Performed at [facility], [address] [place], [place] [address]    Gram Stain   Final    ABUNDANT WBC PRESENT,BOTH PMN AND MONONUCLEAR MODERATE GRAM POSITIVE COCCI RARE YEAST    Culture   Final    MODERATE GROUP B STREP(S.AGALACTIAE)ISOLATED TESTING AGAINST S. AGALACTIAE NOT ROUTINELY PERFORMED DUE TO PREDICTABILITY OF AMP/PEN/VAN SUSCEPTIBILITY. Performed at [facility], [place] [address] [place], [place] [address]    Report Status 03/11/2019 FINAL  Final  CULTURE, BLOOD (ROUTINE X 2) w Reflex to ID Panel     Status: None   Collection Time: 03/09/19  2:53 PM   Specimen: BLOOD  Result Value Ref Range  Status   Specimen Description   Final    BLOOD BLOOD RIGHT ARM Performed at [facility], [address] [place], [place] [address]    Special Requests   Final    BOTTLES DRAWN AEROBIC AND ANAEROBIC Blood Culture results may not be optimal due to an excessive volume of blood received in culture bottles Performed at [facility], [address], [place], [place] [address]    Culture   Final    NO GROWTH 5 DAYS Performed at [facility], [place] [address], [place], [place] [address]    Report Status 03/17/2019 FINAL  Final  CULTURE, BLOOD (ROUTINE X 2) w Reflex to ID Panel     Status: None   Collection Time: 03/09/19  5:00 PM   Specimen: BLOOD RIGHT ARM  Result Value Ref Range Status   Specimen Description   Final    BLOOD RIGHT ARM Performed at [facility], [address] [place], [place] [address]    Special Requests   Final    BOTTLES DRAWN AEROBIC AND ANAEROBIC Blood Culture results may not be optimal due to an excessive volume of blood received in culture bottles Performed at [facility], [address], [place], [place] [address]    Culture   Final    NO GROWTH 5 DAYS Performed at [facility], [place] [address], [place], [place] [address]    Report Status 03/15/2019 FINAL  Final  Urine Culture     Status: None   Collection Time: 03/11/19  4:33 AM   Specimen: Nasal Mucosa; Urine  Result Value Ref Range Status   Specimen Description   Final    URINE, RANDOM Performed at [facility], [address], [place], [place] [address]    Special Requests   Final    NONE Performed at [facility], [address] [place], [place] [address]    Culture   Final    NO GROWTH Performed at [facility], [place] [address], [place],  [address]    Report Status 03/12/2019 FINAL  Final    Coagulation Studies: No results for input(s): LABPROT, INR in the last 72 hours.  Urinalysis: No results for  input(s): COLORURINE, LABSPEC, PHURINE, GLUCOSEU, HGBUR, BILIRUBINUR, KETONESUR, PROTEINUR, UROBILINOGEN, NITRITE, LEUKOCYTESUR in the last 72 hours.  Invalid input(s): APPERANCEUR    Imaging: No results found.   Medications:   . sodium chloride Stopped (  03/18/19 1430)   . aspirin EC  325 mg Oral Daily  . budesonide (PULMICORT) nebulizer solution  0.5 mg Nebulization BID  . Chlorhexidine Gluconate Cloth  6 each Topical Q0600  . feeding supplement (NEPRO CARB STEADY)  237 mL Oral BID BM  . heparin injection (subcutaneous)  5,000 Units Subcutaneous Q8H  . insulin aspart  0-9 Units Subcutaneous TID AC & HS  . ipratropium-albuterol  3 mL Nebulization TID  . multivitamin  1 tablet Oral QHS  . pantoprazole  40 mg Oral QHS  . QUEtiapine  100 mg Oral QPM  . sodium chloride flush  10-40 mL Intracatheter Q12H  . venlafaxine XR  150 mg Oral Q breakfast   sodium chloride, bisacodyl, haloperidol lactate, HYDROmorphone (DILAUDID) injection, ipratropium-albuterol, LORazepam, ondansetron (ZOFRAN) IV, polyethylene glycol, QUEtiapine, sodium chloride flush  Assessment/ Plan:  Glenn Rasmussen is a 28 y.o. white male with hypertension, irritable bowel syndrome, depression with overdose. Now with acute renal failure requiring hemodialysis.   1. Acute renal failure: CRRT on 6/1 -6/6. First intermittent hemodialysis treatment was 6/8. Hemodialysis treatments on 6/10, 6/12, 6/13, 6/15, 6/17 and then today 6/19.  Permcath used for the first time on 6/19 Nonoliguric urine output.   Creatinine on admission 1.26, normal GFR Nonoliguric urine output.  - Hold diuretics.  - Monitor over the weekend for renal recovery.   2. Anemia with renal failure:   - EPO with HD treatment MWF.    LOS: 18 Artina Minella 6/19/20201:53 PM

## 2019-03-27 NOTE — Progress Notes (Addendum)
HD Tx Start     03/27/19 1015  Vital Signs  Pulse Rate 76  Resp 18  BP 121/71  BP Location Left Arm  BP Method Automatic  Patient Position (if appropriate) Sitting  Oxygen Therapy  SpO2 99 %  O2 Device Nasal Cannula  O2 Flow Rate (L/min) 2 L/min  During Hemodialysis Assessment  Blood Flow Rate (mL/min) 400 mL/min  Arterial Pressure (mmHg) -180 mmHg  Venous Pressure (mmHg) 130 mmHg  Transmembrane Pressure (mmHg) 50 mmHg  Ultrafiltration Rate (mL/min) 1120 mL/min (1141mL fluid removed per HOUR)  Dialysate Flow Rate (mL/min) 600 ml/min  Conductivity: Machine  14  HD Safety Checks Performed Yes  Dialysis Fluid Bolus Normal Saline  Bolus Amount (mL) 250 mL  Intra-Hemodialysis Comments Tx initiated  Education / Care Plan  Dialysis Education Provided Yes  Documented Education in Care Plan Yes  Hemodialysis Catheter Right Other (Comment) Double lumen Permanent (Tunneled)  Placement Date/Time: 03/26/19 1135   Placed prior to admission: No  Person Inserting Catheter: IR  Orientation: Right  Access Location: (c) Other (Comment)  Hemodialysis Catheter Type: Double lumen Permanent (Tunneled)  Site Condition No complications  Blue Lumen Status Infusing  Red Lumen Status Infusing  Purple Lumen Status N/A  Dressing Type Occlusive;Biopatch  Dressing Status Clean;Dry;Intact  Drainage Description None

## 2019-03-27 NOTE — Progress Notes (Signed)
Patient declined Bipap QHS at this time. Patient resting comfortably in bed on 2L [place], clear bilateral breath sounds, no distress noted. Sitter in room with patient. Will continue to monitor.

## 2019-03-27 NOTE — Progress Notes (Signed)
[place] [facility] [place] NAME: Glenn Rasmussen    MR#:  [id number]  DATE OF BIRTH:  Oct 03, 1991  SUBJECTIVE:   Patient seen at hemodialysis today.  Tolerating HD well.  No other acute events overnight.  Patient denies any complaints presently.  REVIEW OF SYSTEMS:    Review of Systems  Constitutional: Negative for chills and fever.  HENT: Negative for congestion and tinnitus.   Eyes: Negative for blurred vision and double vision.  Respiratory: Negative for cough, shortness of breath and wheezing.   Cardiovascular: Negative for chest pain, orthopnea and PND.  Gastrointestinal: Negative for abdominal pain, diarrhea, nausea and vomiting.  Genitourinary: Negative for dysuria and hematuria.  Neurological: Negative for dizziness, sensory change and focal weakness.  All other systems reviewed and are negative.   Nutrition: Renal with fluid restriction.  Tolerating Diet: Yes Tolerating PT: Await Eval.   DRUG ALLERGIES:  No Known Allergies  VITALS:  Blood pressure 118/77, pulse 77, temperature 98.8 F (37.1 C), temperature source Oral, resp. rate (!) 25, height 5\' 7"  (1.702 m), weight 89.9 kg, SpO2 97 %.  PHYSICAL EXAMINATION:   Physical Exam  GENERAL:  28 y.o.-year-old patient lying in bed in no acute distress.  EYES: Pupils equal, round, reactive to light and accommodation. No scleral icterus. Extraocular muscles intact.  HEENT: Head atraumatic, normocephalic. Oropharynx and nasopharynx clear.  NECK:  Supple, no jugular venous distention. No thyroid enlargement, no tenderness.  LUNGS: Normal breath sounds bilaterally, no wheezing, rales, rhonchi. No use of accessory muscles of respiration.  CARDIOVASCULAR: S1, S2 normal. No murmurs, rubs, or gallops.  ABDOMEN: Soft, nontender, nondistended. Bowel sounds present. No organomegaly or mass.  EXTREMITIES: No cyanosis, clubbing or edema b/l.    NEUROLOGIC: Cranial nerves II through XII are  intact. No focal Motor or sensory deficits b/l.   PSYCHIATRIC: The patient is alert and oriented x 3.  Flat affect SKIN: No obvious rash, lesion, or ulcer.   Right chest wall perm-cath in place.   LABORATORY PANEL:   CBC Recent Labs  Lab 03/27/19 0556  WBC 13.9*  HGB 9.0*  HCT 26.7*  PLT 362   ------------------------------------------------------------------------------------------------------------------  Chemistries  Recent Labs  Lab 03/23/19 0423  03/27/19 0556  NA 143   < > 133*  K 4.9   < > 3.6  CL 99   < > 95*  CO2 24   < > 24  GLUCOSE 144*   < > 97  BUN 141*   < > 88*  CREATININE 6.86*   < > 3.65*  CALCIUM 7.7*   < > 8.8*  MG 2.5*   < > 1.9  AST 40  --   --   ALT 60*  --   --   ALKPHOS 97  --   --   BILITOT 0.8  --   --    < > = values in this interval not displayed.   ------------------------------------------------------------------------------------------------------------------  Cardiac Enzymes Recent Labs  Lab 03/24/19 1415  TROPONINI 0.06*   ------------------------------------------------------------------------------------------------------------------  RADIOLOGY:  No results found.   ASSESSMENT AND PLAN:   28 year old male with past medical history of hypertension, IBS, depression who presented to the hospital due to altered mental status secondary to drug overdose and also noted to be in acute kidney injury.  1.  Acute Hypoxic respiratory failure due to volume overload/ARDS and now resolved. -Patient was extubated on 03/12/2019--re-intubated on 03/19/2019, then extubated on 03/23/2019. -Weaned off BiPAP  now respiratory status stable.  Continue fluid removal as per hemodialysis. - currently on RA and doing well.   2. Intentional drug overdose -patient overdosed on 38 tablets of Phenergan, 19 tablets of losartan, 42 tablets of Norvasc and 78 tablets of Klonopin - Urine tox positive for benzos and tricyclics - Appreciate psych consult.   Once patient is medically stable, will need inpatient psychiatry admission. -I contacted psychiatry to see if sitter can be discontinued.  Await input from them.  3.  Acute renal failure-likely ATN from hypotension.  -Initially patient was on CRRT but now has been weaned over to hemodialysis. -Status post PermCath yesterday.  Continue dialysis as per nephrology.  4. Pneumonia- resolved and off abx now.   5. Anemia due to anemia of chronic disease  - Hg stable and will cont. To monitor.  - no need for transfusion.   6.  GERD-Protonix  7.  Elevated troponin-likely demand ischemia. Most recent echo this admission showing normal LV function and no wall motion abnormalities.  Await PT eval.    All the records are reviewed and case discussed with Care Management/Social Worker. Management plans discussed with the patient, family and they are in agreement.  CODE STATUS: Full code  DVT Prophylaxis: Hep. sQ  TOTAL TIME TAKING CARE OF THIS PATIENT: 30 minutes.   POSSIBLE D/C IN 3-4 DAYS, DEPENDING ON CLINICAL CONDITION.   Henreitta Leber M.D on 03/27/2019 at 3:02 PM  Between 7am to 6pm - Pager - [phone]  After 6pm go to [email or web address] - Proofreader  [facility]  Office  [phone]  CC: Primary care physician; Maeola Sarah, MD

## 2019-03-27 NOTE — Progress Notes (Signed)
Post HD Assessment   8 beat run of Vtach on monitor, with pt experiencing symptoms of "fluttering" in chest and dizziness with 45 mins left in tx. UF turned off early in tx d/t the c/o dizziness, and blood flow rate reduced (with Dr. Juleen China @ bedside) during episode of Vtach. Subsided with decrease in blood flow rate with UF off. Tx still ends 15 mins early per MD d/t the event.    03/27/19 1315  Neurological  Level of Consciousness Alert  Orientation Level Oriented X4  Respiratory  Respiratory Pattern Regular  Chest Assessment Chest expansion symmetrical  Bilateral Breath Sounds Diminished  Cardiac  Pulse Regular  Heart Sounds S1, S2  ECG Monitor Yes  Cardiac Rhythm NSR  Ectopy Multifocal PVC's  Ectopy Frequency Occasional  Vascular  R Radial Pulse +2  L Radial Pulse +2  Edema Generalized  Generalized Edema +1  Integumentary  Integumentary (WDL) X  Rash Location Abdomen;Back  Rash Location Orientation Other (Comment)  Musculoskeletal  Musculoskeletal (WDL) X  Generalized Weakness Yes  Gastrointestinal  Bowel Sounds Assessment Active  GU Assessment  Genitourinary (WDL) X  Genitourinary Symptoms Oliguria (HD pt acute)  Psychosocial  Psychosocial (WDL) X  Patient Behaviors Calm;Cooperative;Sad  Needs Expressed Emotional;Physical  Emotional support given Given to patient

## 2019-03-27 NOTE — Consult Note (Addendum)
[facility] Face-to-Face Psychiatry Consult follow-up  Reason for Consult: Suicide attempt by overdose Referring Physician: ICU provider Patient Identification: Glenn Rasmussen MRN:  [id number] Principal Diagnosis: Multiple drug overdose Diagnosis:  Principal Problem:   Multiple drug overdose Active Problems:   MDD (major depressive disorder), recurrent severe, without psychosis ([place])   Suicide attempt ([place])   HTN (hypertension)   AKI (acute kidney injury) ([place])   Acute respiratory failure ([place])   Antihypertensive agent overdose   Calcium channel blocker overdose   Intentional benzodiazepine overdose ([place])  Patient is seen, chart is reviewed.   Patient has provided permission to speak with mother, [name] [phone]) Total Time spent with patient: 25 minutes Subjective: "I'm just cold (in dialysis), feel weird."   03/27/2019 Patient is emotional labile on assessment with tearing up at times.  Initially, he was seen in dialysis as the MD called for the consult early.  Pearley states the new dialysis method feel "weird" and he was cold.  Nurse brought an additional blanket who reported later that he was depressed and tearful with her at times.  He was assessed later in his room and was enjoying listening to music with his sitter but teared up when talking.  Pleasant and polite in all interactions but lability of mood is worse than it was originally.    03/26/2019 On evaluation today, patient has been moved from ICU to medical stepdown unit.  Patient feels optimistic that he is improving, however has an overwhelming sense of remorse about his suicide attempt that caused him to be in this position.  He is tearful during evaluation, and states that "prior to my overdose I was just so tired.  I have seen so much trauma."  Patient denies that he is ever sought treatment for his past traumas.  He reports that he has nightmares and flashbacks.  He had previously tried trazodone which was not helpful  for sleep.  He states that he has not found mirtazapine to be helpful for sleep, and notes that he felt his depression was somewhat better when taking venlafaxine, however he had required higher than recommended doses.  Patient appears to prefer to keep conversation superficial, he shuts down with probing related to events leading up to suicide attempt and past traumas.  Patient describes ruminations that keep him from sleeping at night.  He requests having medication earlier to "stop the thoughts before at bedtime."  HPI:  Glenn Rasmussen is a 28 y.o. male patient admitted with drug overdose and suicide attempt.  Patient presents to the ED reporting multiple drug overdose.  He states that he is depressed and "just does not care about life anymore".  He states that he took a number of medications tonight including benzodiazepine, calcium channel blocker, Phenergan, amlodipine, and "just did not care what happened".  He is currently hypotensive, though awake and alert.  Treatment for his overdose was initiated in the ED and hospitalist were called for admission. Patient has been receiving dialysis treatments.  Early this morning 03/17/2019, patient had acute respiratory distress and abdominal pain and was returned to the intensive care unit.  Patient is now receiving ventilatory support, and has an NG tube in place to suction.  Surgical consultation is pending.  Prognosis remains guarded, patient may have end-stage renal disease.  Psychiatry consult is requested for evaluation of ongoing suicidal ideation and treatment of depression.  Past Psychiatric History: Per medication review, patient appears to be treated for ADHD, anxiety, and insomnia. Patient reports  being on multiple antidepressants in the past, and states, "none of them worked for me.  They all made me feel sick to my stomach and caused me to vomit."  Risk to Self:  Yes Risk to Others:  Not known Prior Inpatient Therapy:  Not known Prior  Outpatient Therapy:  Not known   Past Medical History:  Past Medical History:  Diagnosis Date  . Hypertension   . IBS (irritable bowel syndrome)     Past Surgical History:  Procedure Laterality Date  . APPENDECTOMY    . CHOLECYSTECTOMY    . DIALYSIS/PERMA CATHETER INSERTION N/A 03/26/2019   Procedure: DIALYSIS/PERMA CATHETER INSERTION;  Surgeon: Algernon Huxley, MD;  Location: [place] [facility];  Service: Cardiovascular;  Laterality: N/A;  . TONSILLECTOMY     Family History:  Family History  Problem Relation Age of Onset  . Lung cancer Mother   . Lung cancer Father    Family Psychiatric  History: Unknown  Social History:  Social History   Substance and Sexual Activity  Alcohol Use No  . Frequency: Never     Social History   Substance and Sexual Activity  Drug Use No    Social History   Socioeconomic History  . Marital status: Single    Spouse name: Not on file  . Number of children: Not on file  . Years of education: Not on file  . Highest education level: Not on file  Occupational History  . Not on file  Social Needs  . Financial resource strain: Not on file  . Food insecurity    Worry: Not on file    Inability: Not on file  . Transportation needs    Medical: Not on file    Non-medical: Not on file  Tobacco Use  . Smoking status: Never Smoker  . Smokeless tobacco: Never Used  Substance and Sexual Activity  . Alcohol use: No    Frequency: Never  . Drug use: No  . Sexual activity: Not on file  Lifestyle  . Physical activity    Days per week: Not on file    Minutes per session: Not on file  . Stress: Not on file  Relationships  . Social Herbalist on phone: Not on file    Gets together: Not on file    Attends religious service: Not on file    Active member of club or organization: Not on file    Attends meetings of clubs or organizations: Not on file    Relationship status: Not on file  Other Topics Concern  . Not on file  Social  History Narrative  . Not on file   Additional Social History:    Patient lives alone.  He reports his plan for discharge is to return to living independently.  Allergies:  No Known Allergies  Labs:  Results for orders placed or performed during the hospital encounter of 03/08/19 (from the past 48 hour(s))  Glucose, capillary     Status: Abnormal   Collection Time: 03/25/19 11:24 PM  Result Value Ref Range   Glucose-Capillary 160 (H) 70 - 99 mg/dL  Magnesium     Status: Abnormal   Collection Time: 03/26/19  5:03 AM  Result Value Ref Range   Magnesium 2.5 (H) 1.7 - 2.4 mg/dL    Comment: Performed at [facility], [address] [place], [place] [address]  Basic metabolic panel     Status: Abnormal   Collection Time: 03/26/19  5:03 AM  Result Value Ref Range   Sodium 144 135 - 145 mmol/L    Comment: RESULTS VERIFIED BY REPEAT TESTING   Potassium 3.7 3.5 - 5.1 mmol/L   Chloride 101 98 - 111 mmol/L   CO2 27 22 - 32 mmol/L   Glucose, Bld 116 (H) 70 - 99 mg/dL   BUN 85 (H) 6 - 20 mg/dL   Creatinine, Ser 4.28 (H) 0.61 - 1.24 mg/dL   Calcium 9.2 8.9 - 10.3 mg/dL   GFR calc non Af Amer 17 (L) >60 mL/min   GFR calc Af Amer 20 (L) >60 mL/min   Anion gap 16 (H) 5 - 15    Comment: Performed at [facility], [address] [place], [place] [address]  CBC with Differential/Platelet     Status: Abnormal   Collection Time: 03/26/19  5:03 AM  Result Value Ref Range   WBC 16.6 (H) 4.0 - 10.5 K/uL   RBC 3.13 (L) 4.22 - 5.81 MIL/uL   Hemoglobin 8.7 (L) 13.0 - 17.0 g/dL   HCT 26.2 (L) 39.0 - 52.0 %   MCV 83.7 80.0 - 100.0 fL   MCH 27.8 26.0 - 34.0 pg   MCHC 33.2 30.0 - 36.0 g/dL   RDW 13.2 11.5 - 15.5 %   Platelets 390 150 - 400 K/uL   nRBC 0.0 0.0 - 0.2 %   Neutrophils Relative % 74 %   Neutro Abs 12.3 (H) 1.7 - 7.7 K/uL   Lymphocytes Relative 15 %   Lymphs Abs 2.4 0.7 - 4.0 K/uL   Monocytes Relative 6 %   Monocytes Absolute 1.0 0.1 - 1.0 K/uL   Eosinophils  Relative 3 %   Eosinophils Absolute 0.5 0.0 - 0.5 K/uL   Basophils Relative 0 %   Basophils Absolute 0.0 0.0 - 0.1 K/uL   Immature Granulocytes 2 %   Abs Immature Granulocytes 0.31 (H) 0.00 - 0.07 K/uL    Comment: Performed at [facility], [address] [place], [place] [address]  Glucose, capillary     Status: Abnormal   Collection Time: 03/26/19  5:18 AM  Result Value Ref Range   Glucose-Capillary 126 (H) 70 - 99 mg/dL  Glucose, capillary     Status: Abnormal   Collection Time: 03/26/19  7:56 AM  Result Value Ref Range   Glucose-Capillary 106 (H) 70 - 99 mg/dL  Glucose, capillary     Status: None   Collection Time: 03/26/19  9:24 AM  Result Value Ref Range   Glucose-Capillary 97 70 - 99 mg/dL  Glucose, capillary     Status: Abnormal   Collection Time: 03/26/19 11:39 AM  Result Value Ref Range   Glucose-Capillary 114 (H) 70 - 99 mg/dL  Glucose, capillary     Status: Abnormal   Collection Time: 03/26/19  4:51 PM  Result Value Ref Range   Glucose-Capillary 166 (H) 70 - 99 mg/dL  Glucose, capillary     Status: Abnormal   Collection Time: 03/26/19  7:50 PM  Result Value Ref Range   Glucose-Capillary 121 (H) 70 - 99 mg/dL  Magnesium     Status: None   Collection Time: 03/27/19  5:56 AM  Result Value Ref Range   Magnesium 1.9 1.7 - 2.4 mg/dL    Comment: Performed at [facility], [address], [place], [place] [address]  CBC with Differential/Platelet     Status: Abnormal   Collection Time: 03/27/19  5:56 AM  Result Value Ref  Range   WBC 13.9 (H) 4.0 - 10.5 K/uL   RBC 3.30 (L) 4.22 - 5.81 MIL/uL   Hemoglobin 9.0 (L) 13.0 - 17.0 g/dL   HCT 26.7 (L) 39.0 - 52.0 %   MCV 80.9 80.0 - 100.0 fL   MCH 27.3 26.0 - 34.0 pg   MCHC 33.7 30.0 - 36.0 g/dL   RDW 12.9 11.5 - 15.5 %   Platelets 362 150 - 400 K/uL   nRBC 0.0 0.0 - 0.2 %   Neutrophils Relative % 63 %   Neutro Abs 8.7 (H) 1.7 - 7.7 K/uL   Lymphocytes Relative 21 %   Lymphs Abs 3.0 0.7 - 4.0 K/uL    Monocytes Relative 8 %   Monocytes Absolute 1.1 (H) 0.1 - 1.0 K/uL   Eosinophils Relative 6 %   Eosinophils Absolute 0.9 (H) 0.0 - 0.5 K/uL   Basophils Relative 0 %   Basophils Absolute 0.0 0.0 - 0.1 K/uL   Immature Granulocytes 2 %   Abs Immature Granulocytes 0.23 (H) 0.00 - 0.07 K/uL    Comment: Performed at [facility], [address] [place], [place] [address]  Basic metabolic panel     Status: Abnormal   Collection Time: 03/27/19  5:56 AM  Result Value Ref Range   Sodium 133 (L) 135 - 145 mmol/L   Potassium 3.6 3.5 - 5.1 mmol/L   Chloride 95 (L) 98 - 111 mmol/L   CO2 24 22 - 32 mmol/L   Glucose, Bld 97 70 - 99 mg/dL   BUN 88 (H) 6 - 20 mg/dL    Comment: RESULT CONFIRMED BY MANUAL DILUTION SG/DS   Creatinine, Ser 3.65 (H) 0.61 - 1.24 mg/dL   Calcium 8.8 (L) 8.9 - 10.3 mg/dL   GFR calc non Af Amer 21 (L) >60 mL/min   GFR calc Af Amer 24 (L) >60 mL/min   Anion gap 14 5 - 15    Comment: Performed at [facility], [address] [place], [place] [address]  Glucose, capillary     Status: None   Collection Time: 03/27/19  8:09 AM  Result Value Ref Range   Glucose-Capillary 90 70 - 99 mg/dL  Glucose, capillary     Status: Abnormal   Collection Time: 03/27/19  4:23 PM  Result Value Ref Range   Glucose-Capillary 103 (H) 70 - 99 mg/dL    Current Facility-Administered Medications  Medication Dose Route Frequency Provider Last Rate Last Dose  . 0.9 %  sodium chloride infusion   Intravenous PRN Algernon Huxley, MD   Stopped at 03/18/19 1430  . aspirin EC tablet 325 mg  325 mg Oral Daily Algernon Huxley, MD   325 mg at 03/27/19 0911  . bisacodyl (DULCOLAX) suppository 10 mg  10 mg Rectal Daily PRN Algernon Huxley, MD      . budesonide (PULMICORT) nebulizer solution 0.5 mg  0.5 mg Nebulization BID Algernon Huxley, MD   0.5 mg at 03/27/19 0728  . Chlorhexidine Gluconate Cloth 2 % PADS 6 each  6 each Topical Q0600 Algernon Huxley, MD   6 each at 03/27/19 0617  . feeding  supplement (NEPRO CARB STEADY) liquid 237 mL  237 mL Oral BID BM Algernon Huxley, MD   237 mL at 03/27/19 0912  . haloperidol lactate (HALDOL) injection 2 mg  2 mg Intravenous Q6H PRN Algernon Huxley, MD      . heparin injection 5,000 Units  5,000 Units Subcutaneous  Q8H Algernon Huxley, MD   5,000 Units at 03/27/19 1403  . HYDROmorphone (DILAUDID) injection 1 mg  1 mg Intravenous Once PRN Algernon Huxley, MD      . insulin aspart (novoLOG) injection 0-9 Units  0-9 Units Subcutaneous TID AC & HS Algernon Huxley, MD   2 Units at 03/26/19 1731  . ipratropium-albuterol (DUONEB) 0.5-2.5 (3) MG/3ML nebulizer solution 3 mL  3 mL Nebulization Q6H PRN Algernon Huxley, MD   3 mL at 03/17/19 0818  . ipratropium-albuterol (DUONEB) 0.5-2.5 (3) MG/3ML nebulizer solution 3 mL  3 mL Nebulization TID Henreitta Leber, MD      . LORazepam (ATIVAN) injection 2-4 mg  2-4 mg Intravenous Q1H PRN Algernon Huxley, MD   2 mg at 03/27/19 1403  . multivitamin (RENA-VIT) tablet 1 tablet  1 tablet Oral QHS Algernon Huxley, MD   1 tablet at 03/26/19 2110  . ondansetron (ZOFRAN) injection 4 mg  4 mg Intravenous Q6H PRN Algernon Huxley, MD      . pantoprazole (PROTONIX) EC tablet 40 mg  40 mg Oral QHS Algernon Huxley, MD   40 mg at 03/26/19 2111  . polyethylene glycol (MIRALAX / GLYCOLAX) packet 17 g  17 g Per Tube Daily PRN Algernon Huxley, MD      . QUEtiapine (SEROQUEL) tablet 100 mg  100 mg Oral QPM Lavella Hammock, MD      . QUEtiapine (SEROQUEL) tablet 100 mg  100 mg Oral QHS PRN Lavella Hammock, MD   100 mg at 03/26/19 2110  . sodium chloride flush (NS) 0.9 % injection 10-40 mL  10-40 mL Intracatheter Q12H Algernon Huxley, MD   10 mL at 03/26/19 2115  . sodium chloride flush (NS) 0.9 % injection 10-40 mL  10-40 mL Intracatheter PRN Algernon Huxley, MD   10 mL at 03/27/19 0912  . venlafaxine XR (EFFEXOR-XR) 24 hr capsule 150 mg  150 mg Oral Q breakfast Lavella Hammock, MD   150 mg at 03/27/19 0911    Musculoskeletal: Strength & Muscle Tone: decreased Gait  & Station: Not assessed Patient leans: N/A  Psychiatric Specialty Exam: Physical Exam  Nursing note and vitals reviewed. Constitutional: He is oriented to person, place, and time. He appears well-developed and well-nourished. No distress.  HENT:  Head: Normocephalic and atraumatic.  Eyes: EOM are normal.  Neck: Normal range of motion.  Cardiovascular: Regular rhythm.  Tachycardia  Respiratory:  Labored breathing on high flow oxygen via nasal cannula  Musculoskeletal: Normal range of motion.  Neurological: He is alert and oriented to person, place, and time.  Psychiatric: His speech is normal and behavior is normal. Judgment and thought content normal. His mood appears anxious. Cognition and memory are impaired. He exhibits a depressed mood.    Review of Systems  Constitutional: Positive for malaise/fatigue.  Respiratory: Negative.   Cardiovascular: Negative.  Negative for chest pain and palpitations.  Gastrointestinal: Positive for nausea.  Musculoskeletal: Positive for myalgias.  Neurological: Positive for weakness.  Psychiatric/Behavioral: Positive for depression. Negative for hallucinations, substance abuse and suicidal ideas. The patient is nervous/anxious. The patient does not have insomnia.     Blood pressure 110/74, pulse 68, temperature 98.8 F (37.1 C), temperature source Oral, resp. rate 18, height 5\' 7"  (1.702 m), weight 89.9 kg, SpO2 98 %.Body mass index is 31.04 kg/m.  General Appearance: Fairly Groomed  Eye Contact:  Good  Speech:  Clear and Coherent  Volume:  Decreased  Mood:  Anxious and Depressed  Affect:  Depressed and Tearful  Thought Process:  Coherent  Orientation:  Full (Time, Place, and Person)  Thought Content:  Hallucinations: None  Suicidal Thoughts:  Denies currently, following suicide attempt by overdose  Homicidal Thoughts:  No  Memory:  NA  Judgement:  Poor  Insight:  Present and Shallow  Psychomotor Activity:  Decreased  Concentration:   Concentration: Fair  Recall:  [place] of Knowledge:  Good  Language:  Fair  Akathisia:  No  Handed:  Right  AIMS (if indicated):     Assets:  Communication Skills Desire for Improvement Social Support patient has given consent to talk to mother, [name] [phone]  ADL's:  Impaired  Cognition:  WNL  Sleep:   decreased     Treatment Plan Summary: Daily contact with patient to assess and evaluate symptoms and progress in treatment and Medication management  Major depressive disorder, recurrent, severe without psychosis: -Continue venlafaxine X are 150 mg daily for depression and anxiety. -Continue Seroquel 100 mg at 8 PM for ruminations.  Patient may repeat 100 mg at bedtime as needed for ongoing ruminations/anxiety or sleep -Admit to inpatient when medically cleared  Anxiety: -Continue Seroquel 100 mg at bedtime   Disposition: Recommend psychiatric Inpatient admission when medically cleared. Supportive therapy provided about ongoing stressors.  patient has given consent to talk to mother, [name] [phone]  Psychiatry will continue to follow and advise medication management as indicated.  Waylan Boga, NP 03/27/2019 6:14 PM

## 2019-03-27 NOTE — TOC Progression Note (Signed)
Transition of Care [facility]) - Progression Note    Patient Details  Name: Glenn Rasmussen MRN: [id number] Date of Birth: 1991-09-11  Transition of Care [facility]) CM/SW Contact  Ross Ludwig, [place] Phone Number: 03/27/2019, 5:02 PM  Clinical Narrative:     CSW continuing to follow patient's progress, PT recommending CIR.   Expected Discharge Plan: [place] Barriers to Discharge: Continued Medical Work up  Expected Discharge Plan and Services Expected Discharge Plan: [place]   Discharge Planning Services: CM Consult   Living arrangements for the past 2 months: Apartment                                       Social Determinants of Health (SDOH) Interventions    Readmission Risk Interventions No flowsheet data found.

## 2019-03-27 NOTE — Progress Notes (Signed)
Patient accepted at [facility], Chair time TTS 11:20. Please let DC know when patient will be medically stable for discharged to set up first day arrival.

## 2019-03-27 NOTE — Progress Notes (Signed)
OT Cancellation Note  Patient Details Name: Glenn Rasmussen MRN: [id number] DOB: [birth year]   Cancelled Evaluation:    Reason Eval/Treat Not Completed: Patient at procedure or test/ unavailable. Order received and chart reviewed. Pt noted to be at HD at this time. Will follow acutely and re-attempt at a later time/date as available and pt medically appropriate for OT eval.   Shara Blazing, M.S., OTR/L Ascom: [phone] 03/27/19, 11:23 AM

## 2019-03-27 NOTE — Progress Notes (Signed)
Pre HD Assessment    03/27/19 0915  Neurological  Level of Consciousness Alert  Orientation Level Oriented X4  Respiratory  Respiratory Pattern Regular;Unlabored  Chest Assessment Chest expansion symmetrical  Bilateral Breath Sounds Diminished  Cardiac  Pulse Regular  Heart Sounds S1, S2  ECG Monitor Yes  Cardiac Rhythm NSR  Ectopy Multifocal PVC's  Ectopy Frequency Occasional  Vascular  R Radial Pulse +2  L Radial Pulse +2  Edema Generalized  Generalized Edema +1  Integumentary  Integumentary (WDL) X  Rash Location Abdomen;Back  Rash Location Orientation Other (Comment)  Musculoskeletal  Musculoskeletal (WDL) X  Generalized Weakness Yes  Gastrointestinal  Bowel Sounds Assessment Active  GU Assessment  Genitourinary (WDL) X  Genitourinary Symptoms Oliguria (HD pt acute)  Psychosocial  Psychosocial (WDL) X  Patient Behaviors Calm;Cooperative;Sad  Needs Expressed Emotional;Physical  Emotional support given Given to patient

## 2019-03-28 LAB — GLUCOSE, CAPILLARY
Glucose-Capillary: 116 mg/dL — ABNORMAL HIGH (ref 70–99)
Glucose-Capillary: 79 mg/dL (ref 70–99)
Glucose-Capillary: 133 mg/dL — ABNORMAL HIGH (ref 70–99)
Glucose-Capillary: 91 mg/dL (ref 70–99)

## 2019-03-28 LAB — MAGNESIUM: Magnesium: 1.9 mg/dL (ref 1.7–2.4)

## 2019-03-28 MED ORDER — IPRATROPIUM-ALBUTEROL 0.5-2.5 (3) MG/3ML IN SOLN
3.0000 mL | Freq: Two times a day (BID) | RESPIRATORY_TRACT | Status: DC
  Filled 2019-03-28 (×2): qty 3

## 2019-03-28 MED ORDER — QUETIAPINE FUMARATE 25 MG PO TABS
125.0000 mg | ORAL_TABLET | Freq: Every evening | ORAL | Status: DC
  Administered 2019-03-28 – 2019-03-29 (×2): 125 mg via ORAL
  Filled 2019-03-28 (×3): qty 5

## 2019-03-28 NOTE — Progress Notes (Signed)
[place] [facility] [place] NAME: Glenn Rasmussen    MR#:  [id number]  DATE OF BIRTH:  [birth year]  SUBJECTIVE:   Patient had hemodialysis yesterday. He is on room air. Sats 96 to 97%. He is out in the chair eating his breakfast. Denies any complaints.  wondering how long he will have to stay in inpatient behavioral medicine.  REVIEW OF SYSTEMS:    Review of Systems  Constitutional: Negative for chills and fever.  HENT: Negative for congestion and tinnitus.   Eyes: Negative for blurred vision and double vision.  Respiratory: Negative for cough, shortness of breath and wheezing.   Cardiovascular: Negative for chest pain, orthopnea and PND.  Gastrointestinal: Negative for abdominal pain, diarrhea, nausea and vomiting.  Genitourinary: Negative for dysuria and hematuria.  Neurological: Negative for dizziness, sensory change and focal weakness.  All other systems reviewed and are negative.   Nutrition: Renal with fluid restriction.  Tolerating Diet: Yes Tolerating PT: Await Eval.   DRUG ALLERGIES:  No Known Allergies  VITALS:  Blood pressure 118/82, pulse 82, temperature 98.3 F (36.8 C), temperature source Oral, resp. rate 20, height 5\' 7"  (1.702 m), weight 77.4 kg, SpO2 95 %.  PHYSICAL EXAMINATION:   Physical Exam  GENERAL:  28 y.o.-year-old patient lying in bed in no acute distress.  EYES: Pupils equal, round, reactive to light and accommodation. No scleral icterus. Extraocular muscles intact.  HEENT: Head atraumatic, normocephalic. Oropharynx and nasopharynx clear.  NECK:  Supple, no jugular venous distention. No thyroid enlargement, no tenderness.  LUNGS: Normal breath sounds bilaterally, no wheezing, rales, rhonchi. No use of accessory muscles of respiration.  CARDIOVASCULAR: S1, S2 normal. No murmurs, rubs, or gallops.  ABDOMEN: Soft, nontender, nondistended. Bowel sounds present. No organomegaly or mass.  EXTREMITIES: No  cyanosis, clubbing or edema b/l.    NEUROLOGIC: Cranial nerves II through XII are intact. No focal Motor or sensory deficits b/l.   PSYCHIATRIC:  patient is alert and oriented x 3.  Flat affect SKIN: No obvious rash, lesion, or ulcer.   Right chest wall perm-cath in place.   LABORATORY PANEL:   CBC Recent Labs  Lab 03/27/19 0556  WBC 13.9*  HGB 9.0*  HCT 26.7*  PLT 362   ------------------------------------------------------------------------------------------------------------------  Chemistries  Recent Labs  Lab 03/23/19 0423  03/27/19 0556 03/28/19 0726  NA 143   < > 133*  --   K 4.9   < > 3.6  --   CL 99   < > 95*  --   CO2 24   < > 24  --   GLUCOSE 144*   < > 97  --   BUN 141*   < > 88*  --   CREATININE 6.86*   < > 3.65*  --   CALCIUM 7.7*   < > 8.8*  --   MG 2.5*   < > 1.9 1.9  AST 40  --   --   --   ALT 60*  --   --   --   ALKPHOS 97  --   --   --   BILITOT 0.8  --   --   --    < > = values in this interval not displayed.   ------------------------------------------------------------------------------------------------------------------  Cardiac Enzymes Recent Labs  Lab 03/24/19 1415  TROPONINI 0.06*   ------------------------------------------------------------------------------------------------------------------  RADIOLOGY:  No results found.   ASSESSMENT AND PLAN:   28 year old male with past medical history  of hypertension, IBS, depression who presented to the hospital due to altered mental status secondary to drug overdose and also noted to be in acute kidney injury.  1. Acute Hypoxic respiratory failure due to volume overload/ARDS and now resolved. -Patient was extubated on 03/12/2019--re-intubated on 03/19/2019, then extubated on 03/23/2019. -Weaned off BiPAP now respiratory status stable.   -Continue fluid removal as per hemodialysis as needed. Pt making urine - currently on RA and doing well.   2. Intentional drug overdose -patient  overdosed on 38 tablets of Phenergan, 19 tablets of losartan, 42 tablets of Norvasc and 78 tablets of Klonopin - Urine tox positive for benzos and tricyclics - Appreciate psych consult.  Once patient is medically stable, will need inpatient psychiatry admission--pt is improving and best at baseline. D/w Carmelina Paddock. Added today on the list for transfer to inpt psych  3.  Acute non oliguric renal failure-likely ATN from hypotension.  -Initially patient was on CRRT but now has been weaned over to hemodialysis. -Status post PermCath June 19th, 2020.   -Continue dialysis as per nephrology.  4. Pneumonia - resolved and off abx now.   5. Anemia due to anemia of chronic disease  - Hg stable  - no need for transfusion.   6. GERD-Protonix  7.  Elevated troponin-likely demand ischemia. Most recent echo this admission showing normal LV function and no wall motion abnormalities.  Patient seen by PT. Recommends inpatient rehab. Patient moving around well. He uses bedside commode.   CODE STATUS: Full code  DVT Prophylaxis: Hep. SQ  TOTAL TIME TAKING CARE OF THIS PATIENT: 30 minutes.   Pt will discharge to inpt psych rehab when bed opens up.   Fritzi Mandes M.D on 03/28/2019 at 1:11 PM  Between 7am to 6pm - Pager - [phone]  After 6pm go to [email or web address] - Proofreader  [facility]  Office  [phone]  CC: Primary care physician; Maeola Sarah, MD

## 2019-03-28 NOTE — Progress Notes (Signed)
Pt states that IV Ativan given earlier was very helpful for his anxiety. Also states he is open to trying po meds whenever appropriate.

## 2019-03-28 NOTE — Progress Notes (Signed)
Rehab Admissions Coordinator Note:  Per OT recommendation, this patient was screened by Jhonnie Garner for appropriateness for an Inpatient Acute Rehab Consult.  At this time, it appears the plan is for pt to transfer to inpatient psych once medically cleared. Anticipate pt will continue to progress physically while medical workup progresses. Will follow at a distance if plan changes.   Jhonnie Garner 03/28/2019, 1:44 PM  I can be reached at [phone].

## 2019-03-28 NOTE — Consult Note (Signed)
[facility] Face-to-Face Psychiatry Consult follow-up  Reason for Consult: Suicide attempt by overdose Referring Physician: ICU provider Patient Identification: Glenn Rasmussen MRN:  [id number] Principal Diagnosis: Multiple drug overdose Diagnosis:  Principal Problem:   Multiple drug overdose Active Problems:   MDD (major depressive disorder), recurrent severe, without psychosis ([place])   Suicide attempt ([place])   HTN (hypertension)   AKI (acute kidney injury) ([place])   Acute respiratory failure ([place])   Antihypertensive agent overdose   Calcium channel blocker overdose   Intentional benzodiazepine overdose ([place])  Patient is seen, chart is reviewed.   Patient has provided permission to speak with mother, Glenn Rasmussen [phone]) Total Time spent with patient: 25 minutes Subjective: "I'm feeling better today, I just cant sleep well."   03/28/2019: Patient presents smiling and speaking with sitter upon assessment.  When asked to rate his depression and anxiety he put both at a 2 smiling stating he is feeling better.  Patient reported feeling better after speaking to his dad last night about possibly going home next week.  He stated that he is excited to get back to his "new normal".  He is forward thinking and speaks of engaging in current treatment referencing OT consults and learning how to take care of himself again.  He does additonally report that he is only getting about 3 hours of sleep at night having difficulty staying asleep, and requests something to help with this.  "I cant shut off my brain and I keep waking up and not being able to go back to sleep."  He reports his baseline for sleep at home is 6-8 hours of sleep a night with minimal waking.  Patient reported that the bipap was origonally the thing that was waking him up, but he tried sleeping without it today. Patient given medication education and requests and increased dose of seraquil for sleep. Awaiting admission to  psy.   03/27/2019 Patient is emotional labile on assessment with tearing up at times.  Initially, he was seen in dialysis as the MD called for the consult early.  Glenn Rasmussen states the new dialysis method feel "weird" and he was cold.  Nurse brought an additional blanket who reported later that he was depressed and tearful with her at times.  He was assessed later in his room and was enjoying listening to music with his sitter but teared up when talking.  Pleasant and polite in all interactions but lability of mood is worse than it was originally.    03/26/2019 On evaluation today, patient has been moved from ICU to medical stepdown unit.  Patient feels optimistic that he is improving, however has an overwhelming sense of remorse about his suicide attempt that caused him to be in this position.  He is tearful during evaluation, and states that "prior to my overdose I was just so tired.  I have seen so much trauma."  Patient denies that he is ever sought treatment for his past traumas.  He reports that he has nightmares and flashbacks.  He had previously tried trazodone which was not helpful for sleep.  He states that he has not found mirtazapine to be helpful for sleep, and notes that he felt his depression was somewhat better when taking venlafaxine, however he had required higher than recommended doses.  Patient appears to prefer to keep conversation superficial, he shuts down with probing related to events leading up to suicide attempt and past traumas.  Patient describes ruminations that keep him from sleeping at night.  He requests having medication earlier to "stop the thoughts before at bedtime."  HPI:  Glenn Rasmussen is a 28 y.o. male patient admitted with drug overdose and suicide attempt.  Patient presents to the ED reporting multiple drug overdose.  He states that he is depressed and "just does not care about life anymore".  He states that he took a number of medications tonight including  benzodiazepine, calcium channel blocker, Phenergan, amlodipine, and "just did not care what happened".  He is currently hypotensive, though awake and alert.  Treatment for his overdose was initiated in the ED and hospitalist were called for admission. Patient has been receiving dialysis treatments.  Early this morning 03/17/2019, patient had acute respiratory distress and abdominal pain and was returned to the intensive care unit.  Patient is now receiving ventilatory support, and has an NG tube in place to suction.  Surgical consultation is pending.  Prognosis remains guarded, patient may have end-stage renal disease.  Psychiatry consult is requested for evaluation of ongoing suicidal ideation and treatment of depression.  Past Psychiatric History: Per medication review, patient appears to be treated for ADHD, anxiety, and insomnia. Patient reports being on multiple antidepressants in the past, and states, "none of them worked for me.  They all made me feel sick to my stomach and caused me to vomit."  Risk to Self:  Yes Risk to Others:  Not known Prior Inpatient Therapy:  Not known Prior Outpatient Therapy:  Not known  Past Medical History:  Past Medical History:  Diagnosis Date  . Hypertension   . IBS (irritable bowel syndrome)     Past Surgical History:  Procedure Laterality Date  . APPENDECTOMY    . CHOLECYSTECTOMY    . DIALYSIS/PERMA CATHETER INSERTION N/A 03/26/2019   Procedure: DIALYSIS/PERMA CATHETER INSERTION;  Surgeon: Algernon Huxley, MD;  Location: [facility];  Service: Cardiovascular;  Laterality: N/A;  . TONSILLECTOMY     Family History:  Family History  Problem Relation Age of Onset  . Lung cancer Mother   . Lung cancer Father    Family Psychiatric  History: Unknown  Social History:  Social History   Substance and Sexual Activity  Alcohol Use No  . Frequency: Never     Social History   Substance and Sexual Activity  Drug Use No    Social History    Socioeconomic History  . Marital status: Single    Spouse name: Not on file  . Number of children: Not on file  . Years of education: Not on file  . Highest education level: Not on file  Occupational History  . Not on file  Social Needs  . Financial resource strain: Not on file  . Food insecurity    Worry: Not on file    Inability: Not on file  . Transportation needs    Medical: Not on file    Non-medical: Not on file  Tobacco Use  . Smoking status: Never Smoker  . Smokeless tobacco: Never Used  Substance and Sexual Activity  . Alcohol use: No    Frequency: Never  . Drug use: No  . Sexual activity: Not on file  Lifestyle  . Physical activity    Days per week: Not on file    Minutes per session: Not on file  . Stress: Not on file  Relationships  . Social Herbalist on phone: Not on file    Gets together: Not on file    Attends  religious service: Not on file    Active member of club or organization: Not on file    Attends meetings of clubs or organizations: Not on file    Relationship status: Not on file  Other Topics Concern  . Not on file  Social History Narrative  . Not on file   Additional Social History:    Patient lives alone.  He reports his plan for discharge is to return to living independently.  Allergies:  No Known Allergies  Labs:  Results for orders placed or performed during the hospital encounter of 03/08/19 (from the past 48 hour(s))  Glucose, capillary     Status: Abnormal   Collection Time: 03/26/19  4:51 PM  Result Value Ref Range   Glucose-Capillary 166 (H) 70 - 99 mg/dL  Glucose, capillary     Status: Abnormal   Collection Time: 03/26/19  7:50 PM  Result Value Ref Range   Glucose-Capillary 121 (H) 70 - 99 mg/dL  Magnesium     Status: None   Collection Time: 03/27/19  5:56 AM  Result Value Ref Range   Magnesium 1.9 1.7 - 2.4 mg/dL    Comment: Performed at [facility], [address] [place], [place] [address]   CBC with Differential/Platelet     Status: Abnormal   Collection Time: 03/27/19  5:56 AM  Result Value Ref Range   WBC 13.9 (H) 4.0 - 10.5 K/uL   RBC 3.30 (L) 4.22 - 5.81 MIL/uL   Hemoglobin 9.0 (L) 13.0 - 17.0 g/dL   HCT 26.7 (L) 39.0 - 52.0 %   MCV 80.9 80.0 - 100.0 fL   MCH 27.3 26.0 - 34.0 pg   MCHC 33.7 30.0 - 36.0 g/dL   RDW 12.9 11.5 - 15.5 %   Platelets 362 150 - 400 K/uL   nRBC 0.0 0.0 - 0.2 %   Neutrophils Relative % 63 %   Neutro Abs 8.7 (H) 1.7 - 7.7 K/uL   Lymphocytes Relative 21 %   Lymphs Abs 3.0 0.7 - 4.0 K/uL   Monocytes Relative 8 %   Monocytes Absolute 1.1 (H) 0.1 - 1.0 K/uL   Eosinophils Relative 6 %   Eosinophils Absolute 0.9 (H) 0.0 - 0.5 K/uL   Basophils Relative 0 %   Basophils Absolute 0.0 0.0 - 0.1 K/uL   Immature Granulocytes 2 %   Abs Immature Granulocytes 0.23 (H) 0.00 - 0.07 K/uL    Comment: Performed at [facility], [address] [place], [place] [address]  Basic metabolic panel     Status: Abnormal   Collection Time: 03/27/19  5:56 AM  Result Value Ref Range   Sodium 133 (L) 135 - 145 mmol/L   Potassium 3.6 3.5 - 5.1 mmol/L   Chloride 95 (L) 98 - 111 mmol/L   CO2 24 22 - 32 mmol/L   Glucose, Bld 97 70 - 99 mg/dL   BUN 88 (H) 6 - 20 mg/dL    Comment: RESULT CONFIRMED BY MANUAL DILUTION SG/DS   Creatinine, Ser 3.65 (H) 0.61 - 1.24 mg/dL   Calcium 8.8 (L) 8.9 - 10.3 mg/dL   GFR calc non Af Amer 21 (L) >60 mL/min   GFR calc Af Amer 24 (L) >60 mL/min   Anion gap 14 5 - 15    Comment: Performed at [facility], [address] [place], [place] [address]  Glucose, capillary     Status: None   Collection Time: 03/27/19  8:09 AM  Result  Value Ref Range   Glucose-Capillary 90 70 - 99 mg/dL  Glucose, capillary     Status: Abnormal   Collection Time: 03/27/19  4:23 PM  Result Value Ref Range   Glucose-Capillary 103 (H) 70 - 99 mg/dL  Glucose, capillary     Status: Abnormal   Collection Time: 03/27/19  8:32 PM  Result  Value Ref Range   Glucose-Capillary 117 (H) 70 - 99 mg/dL   Comment 1 Notify RN   Magnesium     Status: None   Collection Time: 03/28/19  7:26 AM  Result Value Ref Range   Magnesium 1.9 1.7 - 2.4 mg/dL    Comment: Performed at [facility], [address] [place], [place] [address]  Glucose, capillary     Status: Abnormal   Collection Time: 03/28/19  8:08 AM  Result Value Ref Range   Glucose-Capillary 116 (H) 70 - 99 mg/dL  Glucose, capillary     Status: None   Collection Time: 03/28/19 11:58 AM  Result Value Ref Range   Glucose-Capillary 79 70 - 99 mg/dL    Current Facility-Administered Medications  Medication Dose Route Frequency Provider Last Rate Last Dose  . 0.9 %  sodium chloride infusion   Intravenous PRN Algernon Huxley, MD   Stopped at 03/18/19 1430  . aspirin EC tablet 325 mg  325 mg Oral Daily Algernon Huxley, MD   325 mg at 03/28/19 1215  . bisacodyl (DULCOLAX) suppository 10 mg  10 mg Rectal Daily PRN Algernon Huxley, MD      . budesonide (PULMICORT) nebulizer solution 0.5 mg  0.5 mg Nebulization BID Algernon Huxley, MD   0.5 mg at 03/28/19 0740  . Chlorhexidine Gluconate Cloth 2 % PADS 6 each  6 each Topical Q0600 Algernon Huxley, MD   6 each at 03/28/19 0516  . feeding supplement (NEPRO CARB STEADY) liquid 237 mL  237 mL Oral BID BM Algernon Huxley, MD   237 mL at 03/28/19 1217  . haloperidol lactate (HALDOL) injection 2 mg  2 mg Intravenous Q6H PRN Algernon Huxley, MD      . heparin injection 5,000 Units  5,000 Units Subcutaneous Q8H Algernon Huxley, MD   5,000 Units at 03/28/19 1404  . HYDROmorphone (DILAUDID) injection 1 mg  1 mg Intravenous Once PRN Algernon Huxley, MD      . insulin aspart (novoLOG) injection 0-9 Units  0-9 Units Subcutaneous TID AC & HS Algernon Huxley, MD   2 Units at 03/26/19 1731  . ipratropium-albuterol (DUONEB) 0.5-2.5 (3) MG/3ML nebulizer solution 3 mL  3 mL Nebulization Q6H PRN Algernon Huxley, MD   3 mL at 03/17/19 0818  . ipratropium-albuterol (DUONEB) 0.5-2.5  (3) MG/3ML nebulizer solution 3 mL  3 mL Nebulization BID Fritzi Mandes, MD      . LORazepam (ATIVAN) injection 2-4 mg  2-4 mg Intravenous Q1H PRN Algernon Huxley, MD   2 mg at 03/28/19 1406  . multivitamin (RENA-VIT) tablet 1 tablet  1 tablet Oral QHS Algernon Huxley, MD   1 tablet at 03/27/19 2215  . ondansetron (ZOFRAN) injection 4 mg  4 mg Intravenous Q6H PRN Algernon Huxley, MD      . pantoprazole (PROTONIX) EC tablet 40 mg  40 mg Oral QHS Algernon Huxley, MD   40 mg at 03/27/19 2215  . polyethylene glycol (MIRALAX / GLYCOLAX) packet 17 g  17 g Per Tube Daily PRN Leotis Pain  S, MD      . QUEtiapine (SEROQUEL) tablet 100 mg  100 mg Oral QPM Lavella Hammock, MD   100 mg at 03/27/19 2214  . QUEtiapine (SEROQUEL) tablet 100 mg  100 mg Oral QHS PRN Lavella Hammock, MD   100 mg at 03/26/19 2110  . sodium chloride flush (NS) 0.9 % injection 10-40 mL  10-40 mL Intracatheter Q12H Algernon Huxley, MD   10 mL at 03/28/19 1220  . sodium chloride flush (NS) 0.9 % injection 10-40 mL  10-40 mL Intracatheter PRN Algernon Huxley, MD   10 mL at 03/27/19 0912  . venlafaxine XR (EFFEXOR-XR) 24 hr capsule 150 mg  150 mg Oral Q breakfast Lavella Hammock, MD   150 mg at 03/28/19 1216    Musculoskeletal: Strength & Muscle Tone: decreased Gait & Station: Not assessed Patient leans: N/A  Psychiatric Specialty Exam: Physical Exam  Nursing note and vitals reviewed. Constitutional: He is oriented to person, place, and time. He appears well-developed and well-nourished. No distress.  HENT:  Head: Normocephalic and atraumatic.  Eyes: EOM are normal.  Neck: Normal range of motion.  Cardiovascular: Regular rhythm.  Tachycardia  Respiratory:  Labored breathing on high flow oxygen via nasal cannula  Musculoskeletal: Normal range of motion.  Neurological: He is alert and oriented to person, place, and time.  Psychiatric: His speech is normal and behavior is normal. Judgment and thought content normal. His mood appears anxious.  Cognition and memory are impaired. He exhibits a depressed mood.    Review of Systems  Constitutional: Positive for malaise/fatigue.  Respiratory: Negative.   Cardiovascular: Negative.  Negative for chest pain and palpitations.  Gastrointestinal: Positive for nausea.  Musculoskeletal: Positive for myalgias.  Neurological: Positive for weakness.  Psychiatric/Behavioral: Positive for depression. Negative for hallucinations, substance abuse and suicidal ideas. The patient is nervous/anxious. The patient does not have insomnia.     Blood pressure 118/82, pulse 82, temperature 98.3 F (36.8 C), temperature source Oral, resp. rate 20, height 5\' 7"  (1.702 m), weight 77.4 kg, SpO2 95 %.Body mass index is 26.74 kg/m.  General Appearance: Fairly Groomed  Eye Contact:  Good  Speech:  Clear and Coherent  Volume:  Decreased  Mood:  Anxious and Depressed  Affect:  Depressed and Tearful  Thought Process:  Coherent  Orientation:  Full (Time, Place, and Person)  Thought Content:  Hallucinations: None  Suicidal Thoughts:  Denies currently, following suicide attempt by overdose  Homicidal Thoughts:  No  Memory:  NA  Judgement:  Poor  Insight:  Present and Shallow  Psychomotor Activity:  Decreased  Concentration:  Concentration: Fair  Recall:  [place] of Knowledge:  Good  Language:  Fair  Akathisia:  No  Handed:  Right  AIMS (if indicated):     Assets:  Communication Skills Desire for Improvement Social Support patient has given consent to talk to mother, Glenn Rasmussen [phone]  ADL's:  Impaired  Cognition:  WNL  Sleep:   decreased     Treatment Plan Summary: Daily contact with patient to assess and evaluate symptoms and progress in treatment and Medication management  Major depressive disorder, recurrent, severe without psychosis: -Continue venlafaxine XR are 150 mg daily for depression and anxiety. -Discontinue Seroquel to 100 mg at 8 PM for ruminations.   -Admit to inpatient  when medically cleared  Anxiety/Insomnia: -Increase Seroquel 125 mg at bedtime   Disposition: Recommend psychiatric Inpatient admission when medically cleared. Supportive therapy provided about ongoing  stressors.  patient has given consent to talk to mother, Glenn Rasmussen [phone]  Psychiatry will continue to follow and advise medication management as indicated.  Waylan Boga, NP 03/28/2019 2:30 PM

## 2019-03-28 NOTE — Progress Notes (Signed)
[facility]  ROUNDING NOTE   Subjective:   Hemodialysis treatment yesterday. Run of ventricular tachycardia during treatment. UF of 756mL.   Sitter at bedside.   Patient states he is making urine but unable to void in a urinal  Objective:  Vital signs in last 24 hours:  Temp:  [97.8 F (36.6 C)-98.8 F (37.1 C)] 98.3 F (36.8 C) (06/20 0715) Pulse Rate:  [68-87] 82 (06/20 0715) Resp:  [18-31] 20 (06/20 0715) BP: (110-123)/(68-82) 118/82 (06/20 0715) SpO2:  [95 %-100 %] 95 % (06/20 0740) Weight:  [77.4 kg] 77.4 kg (06/20 0500)  Weight change: -0.819 kg Filed Weights   03/27/19 0500 03/27/19 1015 03/28/19 0500  Weight: 89.9 kg 89.9 kg 77.4 kg    Intake/Output: I/O last 3 completed shifts: In: 1018 [P.O.:1018] Out: 724 [Other:724]   Intake/Output this shift:  Total I/O In: 360 [P.O.:360] Out: -   Physical Exam: General: Sitting in bed  Head: [place]/AT  Eyes: Anicteric, PERRL  Neck: Trachea midline  Lungs:  clear  Heart: regular  Abdomen:  Soft, obese   Extremities:  no peripheral edema.  Neurologic: Nonfocal, moving all four extremities  Skin: No lesions  Access: RIJ permcath 7/20    Basic Metabolic Panel: Recent Labs  Lab 03/22/19 0452 03/23/19 0423 03/24/19 0446 03/25/19 0512 03/26/19 0503 03/27/19 0556 03/28/19 0726  NA 141 143 145 152* 144 133*  --   K 5.1 4.9 4.9 4.4 3.7 3.6  --   CL 95* 99 102 109 101 95*  --   CO2 26 24 25 23 27 24   --   GLUCOSE 172* 144* 124* 136* 116* 97  --   BUN 99* 141* 99* 129* 85* 88*  --   CREATININE 5.61* 6.86* 5.04* 5.60* 4.28* 3.65*  --   CALCIUM 8.0* 7.7* 8.3* 8.7* 9.2 8.8*  --   MG 2.7* 2.5* 2.5* 2.7* 2.5* 1.9 1.9  PHOS 9.1* 9.2*  --  8.7*  --   --   --     Liver Function Tests: Recent Labs  Lab 03/23/19 0423 03/25/19 0512  AST 40  --   ALT 60*  --   ALKPHOS 97  --   BILITOT 0.8  --   PROT 6.4*  --   ALBUMIN 3.9 3.9   No results for input(s): LIPASE, AMYLASE in the last 168 hours. No  results for input(s): AMMONIA in the last 168 hours.  CBC: Recent Labs  Lab 03/22/19 0452 03/23/19 0423 03/25/19 0512 03/26/19 0503 03/27/19 0556  WBC 10.8* 14.6* 13.1* 16.6* 13.9*  NEUTROABS 9.4*  --   --  12.3* 8.7*  HGB 7.8* 7.5* 7.3* 8.7* 9.0*  HCT 24.0* 22.6* 22.3* 26.2* 26.7*  MCV 82.5 82.2 83.5 83.7 80.9  PLT 453* 394 375 390 362    Cardiac Enzymes: Recent Labs  Lab 03/24/19 1415  TROPONINI 0.06*    BNP: Invalid input(s): POCBNP  CBG: Recent Labs  Lab 03/26/19 1950 03/27/19 0809 03/27/19 1623 03/27/19 2032 03/28/19 0808  GLUCAP 121* 90 103* 117* 116*    Microbiology: Results for orders placed or performed during the hospital encounter of 03/08/19  SARS Coronavirus 2 (CEPHEID - Performed in [place] hospital lab), Hosp Order     Status: None   Collection Time: 03/08/19 11:48 PM   Specimen: Nasopharyngeal Swab  Result Value Ref Range Status   SARS Coronavirus 2 NEGATIVE NEGATIVE Final    Comment: (NOTE) If result is NEGATIVE SARS-CoV-2 target nucleic acids are  NOT DETECTED. The SARS-CoV-2 RNA is generally detectable in upper and lower  respiratory specimens during the acute phase of infection. The lowest  concentration of SARS-CoV-2 viral copies this assay can detect is 250  copies / mL. A negative result does not preclude SARS-CoV-2 infection  and should not be used as the sole basis for treatment or other  patient management decisions.  A negative result may occur with  improper specimen collection / handling, submission of specimen other  than nasopharyngeal swab, presence of viral mutation(s) within the  areas targeted by this assay, and inadequate number of viral copies  (<250 copies / mL). A negative result must be combined with clinical  observations, patient history, and epidemiological information. If result is POSITIVE SARS-CoV-2 target nucleic acids are DETECTED. The SARS-CoV-2 RNA is generally detectable in upper and lower   respiratory specimens dur ing the acute phase of infection.  Positive  results are indicative of active infection with SARS-CoV-2.  Clinical  correlation with patient history and other diagnostic information is  necessary to determine patient infection status.  Positive results do  not rule out bacterial infection or co-infection with other viruses. If result is PRESUMPTIVE POSTIVE SARS-CoV-2 nucleic acids MAY BE PRESENT.   A presumptive positive result was obtained on the submitted specimen  and confirmed on repeat testing.  While 2019 novel coronavirus  (SARS-CoV-2) nucleic acids may be present in the submitted sample  additional confirmatory testing may be necessary for epidemiological  and / or clinical management purposes  to differentiate between  SARS-CoV-2 and other Sarbecovirus currently known to infect humans.  If clinically indicated additional testing with an alternate test  methodology [phone]) is advised. The SARS-CoV-2 RNA is generally  detectable in upper and lower respiratory sp ecimens during the acute  phase of infection. The expected result is Negative. Fact Sheet for Patients:  StrictlyIdeas.no Fact Sheet for Healthcare Providers: BankingDealers.co.za This test is not yet approved or cleared by the [place] FDA and has been authorized for detection and/or diagnosis of SARS-CoV-2 by FDA under an Emergency Use Authorization (EUA).  This EUA will remain in effect (meaning this test can be used) for the duration of the COVID-19 declaration under Section 564(b)(1) of the Act, 21 U.S.C. section 360bbb-3(b)(1), unless the authorization is terminated or revoked sooner. Performed at [facility], [address] [place], [place] [address]   MRSA PCR Screening     Status: None   Collection Time: 03/09/19  2:48 AM   Specimen: Nasal Mucosa; Nasopharyngeal  Result Value Ref Range Status   MRSA by PCR NEGATIVE  NEGATIVE Final    Comment:        The GeneXpert MRSA Assay (FDA approved for NASAL specimens only), is one component of a comprehensive MRSA colonization surveillance program. It is not intended to diagnose MRSA infection nor to guide or monitor treatment for MRSA infections. Performed at [facility], [address] [place], [place] [address]   Culture, respiratory (non-expectorated)     Status: None   Collection Time: 03/09/19  8:00 AM   Specimen: Tracheal Aspirate; Respiratory  Result Value Ref Range Status   Specimen Description   Final    TRACHEAL ASPIRATE Performed at [facility], [address], [place], [place] [address]    Special Requests   Final    NONE Performed at [facility], [address] [place], [place] [address]    Gram Stain   Final    ABUNDANT  WBC PRESENT,BOTH PMN AND MONONUCLEAR MODERATE GRAM POSITIVE COCCI RARE YEAST    Culture   Final    MODERATE GROUP B STREP(S.AGALACTIAE)ISOLATED TESTING AGAINST S. AGALACTIAE NOT ROUTINELY PERFORMED DUE TO PREDICTABILITY OF AMP/PEN/VAN SUSCEPTIBILITY. Performed at [facility], [place] [address] [place], [place] [address]    Report Status 03/11/2019 FINAL  Final  CULTURE, BLOOD (ROUTINE X 2) w Reflex to ID Panel     Status: None   Collection Time: 03/09/19  2:53 PM   Specimen: BLOOD  Result Value Ref Range Status   Specimen Description   Final    BLOOD BLOOD RIGHT ARM Performed at [facility], [address], [place], [place] [address]    Special Requests   Final    BOTTLES DRAWN AEROBIC AND ANAEROBIC Blood Culture results may not be optimal due to an excessive volume of blood received in culture bottles Performed at [facility], [address], [place], [place] [address]    Culture   Final    NO GROWTH 5 DAYS Performed at [facility], [place] [address], [place], [place] [address]    Report Status 03/17/2019 FINAL  Final  CULTURE, BLOOD  (ROUTINE X 2) w Reflex to ID Panel     Status: None   Collection Time: 03/09/19  5:00 PM   Specimen: BLOOD RIGHT ARM  Result Value Ref Range Status   Specimen Description   Final    BLOOD RIGHT ARM Performed at [facility], [address], [place], [place] [address]    Special Requests   Final    BOTTLES DRAWN AEROBIC AND ANAEROBIC Blood Culture results may not be optimal due to an excessive volume of blood received in culture bottles Performed at [facility], [address] [place], [place] [address]    Culture   Final    NO GROWTH 5 DAYS Performed at [facility], [place] [address], [place], [place] [address]    Report Status 03/15/2019 FINAL  Final  Urine Culture     Status: None   Collection Time: 03/11/19  4:33 AM   Specimen: Nasal Mucosa; Urine  Result Value Ref Range Status   Specimen Description   Final    URINE, RANDOM Performed at [facility], [address], [place], [place] [address]    Special Requests   Final    NONE Performed at [facility], [address] [place], [place] [address]    Culture   Final    NO GROWTH Performed at [facility], [place] [address] [place], [place] [address]    Report Status 03/12/2019 FINAL  Final    Coagulation Studies: No results for input(s): LABPROT, INR in the last 72 hours.  Urinalysis: No results for input(s): COLORURINE, LABSPEC, PHURINE, GLUCOSEU, HGBUR, BILIRUBINUR, KETONESUR, PROTEINUR, UROBILINOGEN, NITRITE, LEUKOCYTESUR in the last 72 hours.  Invalid input(s): APPERANCEUR    Imaging: No results found.   Medications:   . sodium chloride Stopped (03/18/19 1430)   . aspirin EC  325 mg Oral Daily  . budesonide (PULMICORT) nebulizer solution  0.5 mg Nebulization BID  . Chlorhexidine Gluconate Cloth  6 each Topical Q0600  . feeding supplement (NEPRO CARB STEADY)  237 mL Oral BID BM  . heparin injection (subcutaneous)  5,000 Units Subcutaneous Q8H  . insulin  aspart  0-9 Units Subcutaneous TID AC & HS  . ipratropium-albuterol  3 mL Nebulization BID  . multivitamin  1 tablet  Oral QHS  . pantoprazole  40 mg Oral QHS  . QUEtiapine  100 mg Oral QPM  . sodium chloride flush  10-40 mL Intracatheter Q12H  . venlafaxine XR  150 mg Oral Q breakfast   sodium chloride, bisacodyl, haloperidol lactate, HYDROmorphone (DILAUDID) injection, ipratropium-albuterol, LORazepam, ondansetron (ZOFRAN) IV, polyethylene glycol, QUEtiapine, sodium chloride flush  Assessment/ Plan:  Mr. RANDOM DOBROWSKI is a 28 y.o. white male with hypertension, irritable bowel syndrome, depression with overdose. Now with acute renal failure requiring hemodialysis.   1. Acute renal failure: CRRT on 6/1 -6/6. First intermittent hemodialysis treatment was 6/8. Hemodialysis treatments on 6/10, 6/12, 6/13, 6/15, 6/17 and 6/19.  Permcath used for the first time on 6/19 Nonoliguric urine output.   Creatinine on admission 1.26, normal GFR Nonoliguric urine output.  - Hold diuretics.  - Monitor over the weekend for renal recovery.  - Place condom catheter for more accurate urine output  2. Anemia with renal failure:  Hemoglobin 9.  - EPO with HD treatment MWF.    LOS: 19 Jaquavian Firkus 6/20/202011:49 AM

## 2019-03-28 NOTE — Evaluation (Addendum)
Occupational Therapy Evaluation Patient Details Name: Glenn Rasmussen MRN: [id number] DOB: [birth year] Today's Date: 03/28/2019    History of Present Illness Emmanuell Kantz is a 69yoM with a past medical history of irritable bowel syndrome, hypertension and major depressive disorder who presented to the [facility] emergency department on Mar 08, 2019 after a suicide attempt.  At the time, the patient admitted taking 38 Phenergan, 19 losartan, 42 amlodipine, 78 Klonopin pills.  The patient states that he felt at that time "he did not want to go on living".  The patient had texted a friend to say "goodbye" who had then called EMS who brought him to the [facility] emergency department emergently for care.  Upon arrival to the ICU from the emergency department the patient became agitated requiring Precedex.  He had multiple episodes of emesis with overt aspiration and was emergently intubated to protect his airway. Creatinine upon admission was 1.26 which had been increased to 3.38 in less than 24 hours.  With continued decline in renal function a temporary dialysis catheter was placed and the patient was initiated on hemodialysis. Pt moved to permacath placement on 6/18.   Clinical Impression   Pt admitted with above diagnoses, severe dynamic balance deficits and generalized weakness limiting ability to engage in BADL at desired level of ind. He presents motivated and willing to work with OT. PTA pt living alone and fully independent. At time of eval he requires min- mod A for OOB transfers due to dynamic balance compromises. Dynamic standing balance tasks given, pt requiring min-mod A to maintain standing. Pt has wide BOS and needing frequent cues to correct. He compromises by holding onto external objects/trying to lean on bed. He voices a current therapy goal of being able to stand to urinate. At this time recommend d/c to CIR for pt to receive intensive  therapy to return to independent baseline with good social support he mentions. Will continue to follow per POC listed below.   Follow Up Recommendations  CIR    Equipment Recommendations  Other (comment)    Recommendations for Other Services Rehab consult     Precautions / Restrictions Precautions Precautions: Fall;Other (comment) Precaution Comments: suicide precautions Restrictions Weight Bearing Restrictions: No      Mobility Bed Mobility Overal bed mobility: Needs Assistance Bed Mobility: Supine to Sit     Supine to sit: Min guard     General bed mobility comments: close min guard for safety  Transfers Overall transfer level: Needs assistance Equipment used: 1 person hand held assist Transfers: Sit to/from Stand Sit to Stand: Min assist;Mod assist         General transfer comment: min A for power to standing and steadying throughout t/f, up to mod A to maintain standing due to poor dynamic balance; cues for safe decent, does not guide self    Balance Overall balance assessment: Needs assistance Sitting-balance support: Bilateral upper extremity supported;Feet supported Sitting balance-Leahy Scale: Fair     Standing balance support: Bilateral upper extremity supported;During functional activity Standing balance-Leahy Scale: Poor Standing balance comment: reliance on external support                           ADL either performed or assessed with clinical judgement   ADL Overall ADL's : Needs assistance/impaired Eating/Feeding: Sitting;Modified independent   Grooming: Minimal assistance;Standing Grooming Details (indicate cue type and reason): support for standing balance, reliant on external support  Upper Body Bathing: Min guard;Sitting   Lower Body Bathing: Moderate assistance;Sit to/from stand;Sitting/lateral leans   Upper Body Dressing : Min guard;Standing   Lower Body Dressing: Moderate assistance;Sitting/lateral leans;Sit to/from  stand Lower Body Dressing Details (indicate cue type and reason): compromised dynamic standing balance Toilet Transfer: Minimal assistance;Moderate assistance;Grab bars Toilet Transfer Details (indicate cue type and reason): simulated with recliner; required corrective cues for safe decent Toileting- Clothing Manipulation and Hygiene: Sit to/from stand;Sitting/lateral lean;Min guard;Minimal assistance Toileting - Clothing Manipulation Details (indicate cue type and reason): min A- mod A if standing to urinate Tub/ Banker: Moderate assistance;Shower seat;Ambulation   Functional mobility during ADLs: Minimal assistance General ADL Comments: pt needing min A for dynamic balance tasks     Vision Baseline Vision/History: No visual deficits Patient Visual Report: No change from baseline       Perception     Praxis      Pertinent Vitals/Pain       Hand Dominance     Extremity/Trunk Assessment Upper Extremity Assessment Upper Extremity Assessment: Generalized weakness(grossly 4/5, fatigues easily)   Lower Extremity Assessment Lower Extremity Assessment: Generalized weakness       Communication Communication Communication: No difficulties   Cognition Arousal/Alertness: Awake/alert Behavior During Therapy: WFL for tasks assessed/performed Overall Cognitive Status: Within Functional Limits for tasks assessed                                     General Comments       Exercises     Shoulder Instructions      Home Living Family/patient expects to be discharged to:: Private residence Living Arrangements: Alone Available Help at Discharge: Family;Friend(s) Type of Home: Apartment Home Access: Stairs to enter Entrance Stairs-Number of Steps: 1   Home Layout: One level               Home Equipment: None          Prior Functioning/Environment Level of Independence: Independent        Comments: no prior physical limitations in pt  history        OT Problem List: Decreased strength;Decreased knowledge of use of DME or AE;Decreased coordination;Decreased activity tolerance;Impaired balance (sitting and/or standing)      OT Treatment/Interventions: Self-care/ADL training;Therapeutic exercise;Patient/family education;Balance training;Energy conservation;Therapeutic activities;DME and/or AE instruction;Cognitive remediation/compensation    OT Goals(Current goals can be found in the care plan section) Acute Rehab OT Goals Patient Stated Goal: be able to stand and urinate OT Goal Formulation: With patient Time For Goal Achievement: 04/11/19 Potential to Achieve Goals: Good ADL Goals Pt Will Perform Grooming: with min guard assist;standing Pt Will Perform Upper Body Bathing: with modified independence;sitting Pt Will Perform Lower Body Bathing: with min assist;sitting/lateral leans;sit to/from stand Pt Will Perform Upper Body Dressing: with modified independence;sitting Pt Will Perform Lower Body Dressing: with min assist;sitting/lateral leans;sit to/from stand Pt Will Transfer to Toilet: with min assist;regular height toilet;ambulating Pt Will Perform Tub/Shower Transfer: with min assist;ambulating;shower seat  OT Frequency: Min 3X/week   Barriers to D/C:            Co-evaluation              AM-PAC OT "6 Clicks" Daily Activity     Outcome Measure Help from another person eating meals?: None Help from another person taking care of personal grooming?: A Little Help from another person toileting, which includes using toliet,  bedpan, or urinal?: A Lot Help from another person bathing (including washing, rinsing, drying)?: A Lot Help from another person to put on and taking off regular upper body clothing?: None Help from another person to put on and taking off regular lower body clothing?: A Lot 6 Click Score: 17   End of Session Equipment Utilized During Treatment: Gait belt  Activity Tolerance: Patient  tolerated treatment well Patient left: in chair;with call bell/phone within reach;with nursing/sitter in room  OT Visit Diagnosis: Other abnormalities of gait and mobility (R26.89);Muscle weakness (generalized) (M62.81)                Time: 4383-7793 OT Time Calculation (min): 24 min Charges:  OT General Charges $OT Visit: 1 Visit OT Evaluation $OT Eval Low Complexity: 1 Low OT Treatments $Self Care/Home Management : 8-22 mins  Zenovia Jarred, MSOT, OTR/L [place] OT/ Acute Relief OT [facility] [phone]  Zenovia Jarred 03/28/2019, 1:14 PM

## 2019-03-29 LAB — RENAL FUNCTION PANEL
Albumin: 4.6 g/dL (ref 3.5–5.0)
Anion gap: 13 (ref 5–15)
BUN: 56 mg/dL — ABNORMAL HIGH (ref 6–20)
CO2: 22 mmol/L (ref 22–32)
Calcium: 9.5 mg/dL (ref 8.9–10.3)
Chloride: 100 mmol/L (ref 98–111)
Creatinine, Ser: 2.56 mg/dL — ABNORMAL HIGH (ref 0.61–1.24)
GFR calc Af Amer: 37 mL/min — ABNORMAL LOW (ref >60)
GFR calc non Af Amer: 32 mL/min — ABNORMAL LOW (ref >60)
Glucose, Bld: 83 mg/dL (ref 70–99)
Phosphorus: 3.7 mg/dL (ref 2.5–4.6)
Potassium: 3.8 mmol/L (ref 3.5–5.1)
Sodium: 135 mmol/L (ref 135–145)

## 2019-03-29 LAB — GLUCOSE, CAPILLARY: Glucose-Capillary: 104 mg/dL — ABNORMAL HIGH (ref 70–99)

## 2019-03-29 LAB — MAGNESIUM: Magnesium: 2.1 mg/dL (ref 1.7–2.4)

## 2019-03-29 MED ORDER — ACETAMINOPHEN 325 MG PO TABS
650.0000 mg | ORAL_TABLET | Freq: Four times a day (QID) | ORAL | Status: DC | PRN
  Administered 2019-03-29: 650 mg via ORAL
  Filled 2019-03-29: qty 2

## 2019-03-29 MED ORDER — CLONAZEPAM 0.5 MG PO TABS
0.5000 mg | ORAL_TABLET | Freq: Every day | ORAL | Status: DC | PRN
  Administered 2019-03-29: 0.5 mg via ORAL
  Filled 2019-03-29: qty 1

## 2019-03-29 NOTE — Progress Notes (Signed)
Patient ID: Glenn Rasmussen, male   DOB: 26-Mar-1991, 28 y.o.   MRN: [id number] discussed with [facility] psychiatry. Patient has been stable otherwise. No more suicidal ideation. He has been remorseful for his actions. Will discontinue sitter and continue to observe.

## 2019-03-29 NOTE — Progress Notes (Signed)
[facility] NAME: Karston Hyland    MR#:  [id number]  DATE OF BIRTH:  June 14, 1991  SUBJECTIVE:   He is on room air. Sats 96 to 97%. Some headache  wondering how long he will have to stay in inpatient behavioral medicine.  REVIEW OF SYSTEMS:    Review of Systems  Constitutional: Negative for chills and fever.  HENT: Negative for congestion and tinnitus.   Eyes: Negative for blurred vision and double vision.  Respiratory: Negative for cough, shortness of breath and wheezing.   Cardiovascular: Negative for chest pain, orthopnea and PND.  Gastrointestinal: Negative for abdominal pain, diarrhea, nausea and vomiting.  Genitourinary: Negative for dysuria and hematuria.  Neurological: Negative for dizziness, sensory change and focal weakness.  All other systems reviewed and are negative.   Nutrition: Renal with fluid restriction.  Tolerating Diet: Yes Tolerating PT: ambulating in the room  DRUG ALLERGIES:  No Known Allergies  VITALS:  Blood pressure 128/81, pulse 81, temperature 98.3 F (36.8 C), temperature source Oral, resp. rate (!) 22, height 5\' 7"  (1.702 m), weight 78.4 kg, SpO2 100 %.  PHYSICAL EXAMINATION:   Physical Exam  GENERAL:  28 y.o.-year-old patient lying in bed in no acute distress.  EYES: Pupils equal, round, reactive to light and accommodation. No scleral icterus. Extraocular muscles intact.  HEENT: Head atraumatic, normocephalic. Oropharynx and nasopharynx clear.  NECK:  Supple, no jugular venous distention. No thyroid enlargement, no tenderness.  LUNGS: Normal breath sounds bilaterally, no wheezing, rales, rhonchi. No use of accessory muscles of respiration.  CARDIOVASCULAR: S1, S2 normal. No murmurs, rubs, or gallops.  ABDOMEN: Soft, nontender, nondistended. Bowel sounds present. No organomegaly or mass.  EXTREMITIES: No cyanosis, clubbing or edema b/l.    NEUROLOGIC: Cranial nerves II through XII are  intact. No focal Motor or sensory deficits b/l.   PSYCHIATRIC:  patient is alert and oriented x 3.  Flat affect SKIN: No obvious rash, lesion, or ulcer.   Right chest wall perm-cath in place.   LABORATORY PANEL:   CBC Recent Labs  Lab 03/27/19 0556  WBC 13.9*  HGB 9.0*  HCT 26.7*  PLT 362   ------------------------------------------------------------------------------------------------------------------  Chemistries  Recent Labs  Lab 03/23/19 0423  03/29/19 0614  NA 143   < > 135  K 4.9   < > 3.8  CL 99   < > 100  CO2 24   < > 22  GLUCOSE 144*   < > 83  BUN 141*   < > 56*  CREATININE 6.86*   < > 2.56*  CALCIUM 7.7*   < > 9.5  MG 2.5*   < > 2.1  AST 40  --   --   ALT 60*  --   --   ALKPHOS 97  --   --   BILITOT 0.8  --   --    < > = values in this interval not displayed.   ------------------------------------------------------------------------------------------------------------------  Cardiac Enzymes Recent Labs  Lab 03/24/19 1415  TROPONINI 0.06*   ------------------------------------------------------------------------------------------------------------------  RADIOLOGY:  No results found.   ASSESSMENT AND PLAN:   28 year old male with past medical history of hypertension, IBS, depression who presented to the hospital due to altered mental status secondary to drug overdose and also noted to be in acute kidney injury.  1. Acute Hypoxic respiratory failure due to volume overload/ARDS and now resolved. -Patient was extubated on 03/12/2019--re-intubated on 03/19/2019, then extubated on 03/23/2019. -  Weaned off BiPAP now respiratory status stable.   -Continue fluid removal as per hemodialysis as needed. Pt making urine about 1900 cc over 24 hours - currently on RA and doing well.   2. Intentional drug overdose -patient overdosed on 38 tablets of Phenergan, 19 tablets of losartan, 42 tablets of Norvasc and 78 tablets of Klonopin - Urine tox positive for  benzos and tricyclics - Appreciate psych consult.   patient is medically best at baseline at present/ stable for transfer to inpatient psych per Psych NP pt has been placed on the transfer list  -will defer oral anxiety meds per psych. -d/c iv ativan and haldol  3.  Acute non oliguric renal failure-likely ATN from hypotension.  -Initially patient was on CRRT but now has been weaned over to hemodialysis. -Status post PermCath June 19th, 2020.   -Continue dialysis as per nephrology now assessed on daily basis -labs stable -making good urine  4. Pneumonia - resolved and off abx now.   5. Anemia due to anemia of chronic disease  - Hg stable  - no need for transfusion.   6. GERD-Protonix  7.  Elevated troponin-likely demand ischemia. Most recent echo this admission showing normal LV function and no wall motion abnormalities.  Patient seen by PT. Recommends inpatient rehab. Patient moving around well. He uses bedside commode.   CODE STATUS: Full code  DVT Prophylaxis: Hep. SQ  TOTAL TIME TAKING CARE OF THIS PATIENT: 30 minutes.   Pt will discharge to inpt psych rehab when bed opens up.   Fritzi Mandes M.D on 03/29/2019 at 9:04 AM  Between 7am to 6pm - Pager - [phone]  After 6pm go to [email or web address] - Proofreader  [facility]  Office  [phone]  CC: Primary care physician; Maeola Sarah, MD

## 2019-03-29 NOTE — Progress Notes (Signed)
[facility]  ROUNDING NOTE   Subjective:   Sitter at bedside this morning.   Patient with no complaints. UOP 2400  Objective:  Vital signs in last 24 hours:  Temp:  [98.2 F (36.8 C)-98.9 F (37.2 C)] 98.3 F (36.8 C) (06/21 0744) Pulse Rate:  [81-90] 81 (06/21 0744) Resp:  [20-22] 22 (06/21 0744) BP: (120-128)/(77-83) 128/81 (06/21 0744) SpO2:  [96 %-100 %] 100 % (06/21 0744) Weight:  [78.4 kg] 78.4 kg (06/21 0452)  Weight change: -11.5 kg Filed Weights   03/27/19 1015 03/28/19 0500 03/29/19 0452  Weight: 89.9 kg 77.4 kg 78.4 kg    Intake/Output: I/O last 3 completed shifts: In: 9030 [P.O.:1438] Out: 2400 [Urine:2400]   Intake/Output this shift:  Total I/O In: 280 [P.O.:280] Out: 300 [Urine:300]  Physical Exam: General: Sitting in bed  Head: [place]/AT  Eyes: Anicteric, PERRL  Neck: Trachea midline  Lungs:  clear  Heart: regular  Abdomen:  Soft, obese   Extremities:  no peripheral edema.  Neurologic: Nonfocal, moving all four extremities  Skin: No lesions  Access: RIJ permcath 0/92    Basic Metabolic Panel: Recent Labs  Lab 03/23/19 0423 03/24/19 0446 03/25/19 0512 03/26/19 0503 03/27/19 0556 03/28/19 0726 03/29/19 0614  NA 143 145 152* 144 133*  --  135  K 4.9 4.9 4.4 3.7 3.6  --  3.8  CL 99 102 109 101 95*  --  100  CO2 24 25 23 27 24   --  22  GLUCOSE 144* 124* 136* 116* 97  --  83  BUN 141* 99* 129* 85* 88*  --  56*  CREATININE 6.86* 5.04* 5.60* 4.28* 3.65*  --  2.56*  CALCIUM 7.7* 8.3* 8.7* 9.2 8.8*  --  9.5  MG 2.5* 2.5* 2.7* 2.5* 1.9 1.9 2.1  PHOS 9.2*  --  8.7*  --   --   --  3.7    Liver Function Tests: Recent Labs  Lab 03/23/19 0423 03/25/19 0512 03/29/19 0614  AST 40  --   --   ALT 60*  --   --   ALKPHOS 97  --   --   BILITOT 0.8  --   --   PROT 6.4*  --   --   ALBUMIN 3.9 3.9 4.6   No results for input(s): LIPASE, AMYLASE in the last 168 hours. No results for input(s): AMMONIA in the last 168  hours.  CBC: Recent Labs  Lab 03/23/19 0423 03/25/19 0512 03/26/19 0503 03/27/19 0556  WBC 14.6* 13.1* 16.6* 13.9*  NEUTROABS  --   --  12.3* 8.7*  HGB 7.5* 7.3* 8.7* 9.0*  HCT 22.6* 22.3* 26.2* 26.7*  MCV 82.2 83.5 83.7 80.9  PLT 394 375 390 362    Cardiac Enzymes: Recent Labs  Lab 03/24/19 1415  TROPONINI 0.06*    BNP: Invalid input(s): POCBNP  CBG: Recent Labs  Lab 03/28/19 0808 03/28/19 1158 03/28/19 1630 03/28/19 2054 03/29/19 0744  GLUCAP 116* 79 133* 91 104*    Microbiology: Results for orders placed or performed during the hospital encounter of 03/08/19  SARS Coronavirus 2 (CEPHEID - Performed in [facility] hospital lab), Hosp Order     Status: None   Collection Time: 03/08/19 11:48 PM   Specimen: Nasopharyngeal Swab  Result Value Ref Range Status   SARS Coronavirus 2 NEGATIVE NEGATIVE Final    Comment: (NOTE) If result is NEGATIVE SARS-CoV-2 target nucleic acids are NOT DETECTED. The SARS-CoV-2 RNA is generally detectable in  upper and lower  respiratory specimens during the acute phase of infection. The lowest  concentration of SARS-CoV-2 viral copies this assay can detect is 250  copies / mL. A negative result does not preclude SARS-CoV-2 infection  and should not be used as the sole basis for treatment or other  patient management decisions.  A negative result may occur with  improper specimen collection / handling, submission of specimen other  than nasopharyngeal swab, presence of viral mutation(s) within the  areas targeted by this assay, and inadequate number of viral copies  (<250 copies / mL). A negative result must be combined with clinical  observations, patient history, and epidemiological information. If result is POSITIVE SARS-CoV-2 target nucleic acids are DETECTED. The SARS-CoV-2 RNA is generally detectable in upper and lower  respiratory specimens dur ing the acute phase of infection.  Positive  results are indicative of active  infection with SARS-CoV-2.  Clinical  correlation with patient history and other diagnostic information is  necessary to determine patient infection status.  Positive results do  not rule out bacterial infection or co-infection with other viruses. If result is PRESUMPTIVE POSTIVE SARS-CoV-2 nucleic acids MAY BE PRESENT.   A presumptive positive result was obtained on the submitted specimen  and confirmed on repeat testing.  While 2019 novel coronavirus  (SARS-CoV-2) nucleic acids may be present in the submitted sample  additional confirmatory testing may be necessary for epidemiological  and / or clinical management purposes  to differentiate between  SARS-CoV-2 and other Sarbecovirus currently known to infect humans.  If clinically indicated additional testing with an alternate test  methodology [phone]) is advised. The SARS-CoV-2 RNA is generally  detectable in upper and lower respiratory sp ecimens during the acute  phase of infection. The expected result is Negative. Fact Sheet for Patients:  StrictlyIdeas.no Fact Sheet for Healthcare Providers: BankingDealers.co.za This test is not yet approved or cleared by the [place] FDA and has been authorized for detection and/or diagnosis of SARS-CoV-2 by FDA under an Emergency Use Authorization (EUA).  This EUA will remain in effect (meaning this test can be used) for the duration of the COVID-19 declaration under Section 564(b)(1) of the Act, 21 U.S.C. section 360bbb-3(b)(1), unless the authorization is terminated or revoked sooner. Performed at [facility], [address] [place], [place] [address]   MRSA PCR Screening     Status: None   Collection Time: 03/09/19  2:48 AM   Specimen: Nasal Mucosa; Nasopharyngeal  Result Value Ref Range Status   MRSA by PCR NEGATIVE NEGATIVE Final    Comment:        The GeneXpert MRSA Assay (FDA approved for NASAL specimens only), is  one component of a comprehensive MRSA colonization surveillance program. It is not intended to diagnose MRSA infection nor to guide or monitor treatment for MRSA infections. Performed at [facility], [address] [place], [place] [address]   Culture, respiratory (non-expectorated)     Status: None   Collection Time: 03/09/19  8:00 AM   Specimen: Tracheal Aspirate; Respiratory  Result Value Ref Range Status   Specimen Description   Final    TRACHEAL ASPIRATE Performed at [facility], [address], [place], [place] [address]    Special Requests   Final    NONE Performed at [facility], [address] [place], [place] [address]    Gram Stain   Final    ABUNDANT WBC PRESENT,BOTH PMN AND MONONUCLEAR MODERATE GRAM POSITIVE COCCI  RARE YEAST    Culture   Final    MODERATE GROUP B STREP(S.AGALACTIAE)ISOLATED TESTING AGAINST S. AGALACTIAE NOT ROUTINELY PERFORMED DUE TO PREDICTABILITY OF AMP/PEN/VAN SUSCEPTIBILITY. Performed at [facility], [place] [address], [place], [place] [address]    Report Status 03/11/2019 FINAL  Final  CULTURE, BLOOD (ROUTINE X 2) w Reflex to ID Panel     Status: None   Collection Time: 03/09/19  2:53 PM   Specimen: BLOOD  Result Value Ref Range Status   Specimen Description   Final    BLOOD BLOOD RIGHT ARM Performed at [facility], [address], [place], [place] [address]    Special Requests   Final    BOTTLES DRAWN AEROBIC AND ANAEROBIC Blood Culture results may not be optimal due to an excessive volume of blood received in culture bottles Performed at [facility], [address], [place], [place] [address]    Culture   Final    NO GROWTH 5 DAYS Performed at [facility], [place] [address] [place], [place] [address]    Report Status 03/17/2019 FINAL  Final  CULTURE, BLOOD (ROUTINE X 2) w Reflex to ID Panel     Status: None   Collection Time: 03/09/19  5:00 PM   Specimen: BLOOD  RIGHT ARM  Result Value Ref Range Status   Specimen Description   Final    BLOOD RIGHT ARM Performed at [facility], [address], [place], [place] [address]    Special Requests   Final    BOTTLES DRAWN AEROBIC AND ANAEROBIC Blood Culture results may not be optimal due to an excessive volume of blood received in culture bottles Performed at [facility], [address] [place], [place] [address]    Culture   Final    NO GROWTH 5 DAYS Performed at [facility], [place] [address] [place], [place] [address]    Report Status 03/15/2019 FINAL  Final  Urine Culture     Status: None   Collection Time: 03/11/19  4:33 AM   Specimen: Nasal Mucosa; Urine  Result Value Ref Range Status   Specimen Description   Final    URINE, RANDOM Performed at [facility], [address], [place], [place] [address]    Special Requests   Final    NONE Performed at [facility], [address], [place], [place] [address]    Culture   Final    NO GROWTH Performed at [facility], [place] [address] [place], [place] [address]    Report Status 03/12/2019 FINAL  Final    Coagulation Studies: No results for input(s): LABPROT, INR in the last 72 hours.  Urinalysis: No results for input(s): COLORURINE, LABSPEC, PHURINE, GLUCOSEU, HGBUR, BILIRUBINUR, KETONESUR, PROTEINUR, UROBILINOGEN, NITRITE, LEUKOCYTESUR in the last 72 hours.  Invalid input(s): APPERANCEUR    Imaging: No results found.   Medications:   . sodium chloride Stopped (03/18/19 1430)   . aspirin EC  325 mg Oral Daily  . budesonide (PULMICORT) nebulizer solution  0.5 mg Nebulization BID  . Chlorhexidine Gluconate Cloth  6 each Topical Q0600  . feeding supplement (NEPRO CARB STEADY)  237 mL Oral BID BM  . heparin injection (subcutaneous)  5,000 Units Subcutaneous Q8H  . multivitamin  1 tablet Oral QHS  . pantoprazole  40 mg Oral QHS  . QUEtiapine  125 mg Oral QPM  . sodium chloride  flush  10-40 mL Intracatheter Q12H  .  venlafaxine XR  150 mg Oral Q breakfast   sodium chloride, acetaminophen, bisacodyl, clonazePAM, ipratropium-albuterol, ondansetron (ZOFRAN) IV, polyethylene glycol, sodium chloride flush  Assessment/ Plan:  Mr. Glenn Rasmussen is a 28 y.o. white male with hypertension, irritable bowel syndrome, depression with overdose. Complicated with acute respiratory failure requiring mechanical ventilation, ileus and acute renal failure requiring renal replacement therapy. Now with acute renal failure requiring hemodialysis.   1. Acute renal failure: CRRT on 6/1 -6/6. First intermittent hemodialysis treatment was 6/8. Hemodialysis treatments on 6/10, 6/12, 6/13, 6/15, 6/17 and 6/19.  Permcath used for the first time on 6/19 Nonoliguric urine output.   Creatinine on admission 1.26, normal GFR Nonoliguric urine output.  - Hold diuretics.  - Monitor over the weekend for renal recovery.   2. Anemia with renal failure:  Hemoglobin 9.  - EPO with HD treatment MWF.   3. Depression:  - Appreciate psych input.    LOS: [place] 6/21/202012:41 PM

## 2019-03-29 NOTE — Consult Note (Signed)
[facility] Face-to-Face Psychiatry Consult follow-up  Reason for Consult: Suicide attempt by overdose Referring Physician: ICU provider Patient Identification: Glenn Rasmussen MRN:  [id number] Principal Diagnosis: Multiple drug overdose Diagnosis:  Principal Problem:   Multiple drug overdose Active Problems:   MDD (major depressive disorder), recurrent severe, without psychosis ([place])   Suicide attempt ([place])   HTN (hypertension)   AKI (acute kidney injury) ([place])   Acute respiratory failure ([place])   Antihypertensive agent overdose   Calcium channel blocker overdose   Intentional benzodiazepine overdose ([place])  Patient is seen, chart is reviewed.   Patient has provided permission to speak with mother, [name] [phone]) Total Time spent with patient: 25 minutes Subjective: "I'm feeling OK Im a little anxious because my little brother is going to boot camp today."  03/29/2019: Patient is sitting up in bed and greets staff with a half smile when entering the room.   He states "I don't have a sitter any more you see".  When asked if he felt safe with this he nodded his head and smiled stating yes.  He denied any suicidal and homicidal ideation at this time.  When asked to rate his depression and anxiety he put depression at a 2/10 smiling stating he is feeling better, but reported his anxiety was between a 3-4/10 due to his brother starting boot camp having left on a plane at 4 am this morning.  He reports good contact with his adoptive father but remains tearful when speaking about his biological mother and her substance use disorder.  "She still does not check on me or anything".  He expresses both excitement and apprehension for transfer to behavioral health, but responds well to education and reassurance.   "I am going to give it everything when I go down there I spoke about it with my dad Im going to do what I need to do" Patient is given medication education regarding the change from Ativan to  Klonopin, and reports he takes Klonopin at home and this works well for him.  He continues to report increase in mobility and brightly speaks of updates from his physicians "I don't have to do dialysis yet I might not have to anymore they said my numbers are holding good".  He continue to report that he is only getting about 3 hours of sleep at night and did not fall asleep till 2am last night with the added disturbance of having difficulty staying asleep, and requests something to help with this.  "I don't know why but the medicine is not working yet to help me sleep."  Patient given medication education on Seroquel the potential for increased dose of seraquil for sleep. Awaiting admission to psy.  03/28/2019: Patient presents smiling and speaking with sitter upon assessment.  When asked to rate his depression and anxiety he put both at a 2 smiling stating he is feeling better.  Patient reported feeling better after speaking to his dad last night about possibly going home next week.  He stated that he is excited to get back to his "new normal".  He is forward thinking and speaks of engaging in current treatment referencing OT consults and learning how to take care of himself again.  He does additonally report that he is only getting about 3 hours of sleep at night having difficulty staying asleep, and requests something to help with this.  "I cant shut off my brain and I keep waking up and not being able to go back to  sleep."  He reports his baseline for sleep at home is 6-8 hours of sleep a night with minimal waking.  Patient reported that the bipap was origonally the thing that was waking him up, but he tried sleeping without it today. Patient given medication education and requests and increased dose of seraquil for sleep. Awaiting admission to psy.  03/27/2019 Patient is emotional labile on assessment with tearing up at times.  Initially, he was seen in dialysis as the MD called for the consult early.   Glenn Rasmussen states the new dialysis method feel "weird" and he was cold.  Nurse brought an additional blanket who reported later that he was depressed and tearful with her at times.  He was assessed later in his room and was enjoying listening to music with his sitter but teared up when talking.  Pleasant and polite in all interactions but lability of mood is worse than it was originally.    03/26/2019 On evaluation today, patient has been moved from ICU to medical stepdown unit.  Patient feels optimistic that he is improving, however has an overwhelming sense of remorse about his suicide attempt that caused him to be in this position.  He is tearful during evaluation, and states that "prior to my overdose I was just so tired.  I have seen so much trauma."  Patient denies that he is ever sought treatment for his past traumas.  He reports that he has nightmares and flashbacks.  He had previously tried trazodone which was not helpful for sleep.  He states that he has not found mirtazapine to be helpful for sleep, and notes that he felt his depression was somewhat better when taking venlafaxine, however he had required higher than recommended doses.  Patient appears to prefer to keep conversation superficial, he shuts down with probing related to events leading up to suicide attempt and past traumas.  Patient describes ruminations that keep him from sleeping at night.  He requests having medication earlier to "stop the thoughts before at bedtime."  HPI:  Glenn Rasmussen is a 28 y.o. male patient admitted with drug overdose and suicide attempt.  Patient presents to the ED reporting multiple drug overdose.  He states that he is depressed and "just does not care about life anymore".  He states that he took a number of medications tonight including benzodiazepine, calcium channel blocker, Phenergan, amlodipine, and "just did not care what happened".  He is currently hypotensive, though awake and alert.  Treatment for his  overdose was initiated in the ED and hospitalist were called for admission. Patient has been receiving dialysis treatments.  Early this morning 03/17/2019, patient had acute respiratory distress and abdominal pain and was returned to the intensive care unit.  Patient is now receiving ventilatory support, and has an NG tube in place to suction.  Surgical consultation is pending.  Prognosis remains guarded, patient may have end-stage renal disease.  Psychiatry consult is requested for evaluation of ongoing suicidal ideation and treatment of depression.  Past Psychiatric History: Per medication review, patient appears to be treated for ADHD, anxiety, and insomnia. Patient reports being on multiple antidepressants in the past, and states, "none of them worked for me.  They all made me feel sick to my stomach and caused me to vomit."  Risk to Self:  Yes Risk to Others:  Not known Prior Inpatient Therapy:  Not known Prior Outpatient Therapy:  Not known  Past Medical History:  Past Medical History:  Diagnosis Date  .  Hypertension   . IBS (irritable bowel syndrome)     Past Surgical History:  Procedure Laterality Date  . APPENDECTOMY    . CHOLECYSTECTOMY    . DIALYSIS/PERMA CATHETER INSERTION N/A 03/26/2019   Procedure: DIALYSIS/PERMA CATHETER INSERTION;  Surgeon: Algernon Huxley, MD;  Location: [facility];  Service: Cardiovascular;  Laterality: N/A;  . TONSILLECTOMY     Family History:  Family History  Problem Relation Age of Onset  . Lung cancer Mother   . Lung cancer Father    Family Psychiatric  History: Unknown  Social History:  Social History   Substance and Sexual Activity  Alcohol Use No  . Frequency: Never     Social History   Substance and Sexual Activity  Drug Use No    Social History   Socioeconomic History  . Marital status: Single    Spouse name: Not on file  . Number of children: Not on file  . Years of education: Not on file  . Highest education  level: Not on file  Occupational History  . Not on file  Social Needs  . Financial resource strain: Not on file  . Food insecurity    Worry: Not on file    Inability: Not on file  . Transportation needs    Medical: Not on file    Non-medical: Not on file  Tobacco Use  . Smoking status: Never Smoker  . Smokeless tobacco: Never Used  Substance and Sexual Activity  . Alcohol use: No    Frequency: Never  . Drug use: No  . Sexual activity: Not on file  Lifestyle  . Physical activity    Days per week: Not on file    Minutes per session: Not on file  . Stress: Not on file  Relationships  . Social Herbalist on phone: Not on file    Gets together: Not on file    Attends religious service: Not on file    Active member of club or organization: Not on file    Attends meetings of clubs or organizations: Not on file    Relationship status: Not on file  Other Topics Concern  . Not on file  Social History Narrative  . Not on file   Additional Social History:    Patient lives alone.  He reports his plan for discharge is to return to living independently.  Allergies:  No Known Allergies  Labs:  Results for orders placed or performed during the hospital encounter of 03/08/19 (from the past 48 hour(s))  Glucose, capillary     Status: Abnormal   Collection Time: 03/27/19  4:23 PM  Result Value Ref Range   Glucose-Capillary 103 (H) 70 - 99 mg/dL  Glucose, capillary     Status: Abnormal   Collection Time: 03/27/19  8:32 PM  Result Value Ref Range   Glucose-Capillary 117 (H) 70 - 99 mg/dL   Comment 1 Notify RN   Magnesium     Status: None   Collection Time: 03/28/19  7:26 AM  Result Value Ref Range   Magnesium 1.9 1.7 - 2.4 mg/dL    Comment: Performed at [facility], [address] [place], [place] [address]  Glucose, capillary     Status: Abnormal   Collection Time: 03/28/19  8:08 AM  Result Value Ref Range   Glucose-Capillary 116 (H) 70 - 99 mg/dL   Glucose, capillary     Status: None   Collection Time: 03/28/19 11:58  AM  Result Value Ref Range   Glucose-Capillary 79 70 - 99 mg/dL  Glucose, capillary     Status: Abnormal   Collection Time: 03/28/19  4:30 PM  Result Value Ref Range   Glucose-Capillary 133 (H) 70 - 99 mg/dL  Glucose, capillary     Status: None   Collection Time: 03/28/19  8:54 PM  Result Value Ref Range   Glucose-Capillary 91 70 - 99 mg/dL  Magnesium     Status: None   Collection Time: 03/29/19  6:14 AM  Result Value Ref Range   Magnesium 2.1 1.7 - 2.4 mg/dL    Comment: Performed at [facility], [address] [place], [place] [address]  Renal function panel     Status: Abnormal   Collection Time: 03/29/19  6:14 AM  Result Value Ref Range   Sodium 135 135 - 145 mmol/L   Potassium 3.8 3.5 - 5.1 mmol/L   Chloride 100 98 - 111 mmol/L   CO2 22 22 - 32 mmol/L   Glucose, Bld 83 70 - 99 mg/dL   BUN 56 (H) 6 - 20 mg/dL   Creatinine, Ser 2.56 (H) 0.61 - 1.24 mg/dL   Calcium 9.5 8.9 - 10.3 mg/dL   Phosphorus 3.7 2.5 - 4.6 mg/dL   Albumin 4.6 3.5 - 5.0 g/dL   GFR calc non Af Amer 32 (L) >60 mL/min   GFR calc Af Amer 37 (L) >60 mL/min   Anion gap 13 5 - 15    Comment: Performed at [facility], [address] [place], [place] [address]  Glucose, capillary     Status: Abnormal   Collection Time: 03/29/19  7:44 AM  Result Value Ref Range   Glucose-Capillary 104 (H) 70 - 99 mg/dL    Current Facility-Administered Medications  Medication Dose Route Frequency Provider Last Rate Last Dose  . 0.9 %  sodium chloride infusion   Intravenous PRN Algernon Huxley, MD   Stopped at 03/18/19 1430  . acetaminophen (TYLENOL) tablet 650 mg  650 mg Oral Q6H PRN Fritzi Mandes, MD   650 mg at 03/29/19 0956  . aspirin EC tablet 325 mg  325 mg Oral Daily Algernon Huxley, MD   325 mg at 03/29/19 0934  . bisacodyl (DULCOLAX) suppository 10 mg  10 mg Rectal Daily PRN Algernon Huxley, MD      . budesonide (PULMICORT) nebulizer  solution 0.5 mg  0.5 mg Nebulization BID Algernon Huxley, MD   0.5 mg at 03/28/19 0740  . Chlorhexidine Gluconate Cloth 2 % PADS 6 each  6 each Topical Q0600 Algernon Huxley, MD   6 each at 03/29/19 0533  . clonazePAM (KLONOPIN) tablet 0.5 mg  0.5 mg Oral Daily PRN Patrecia Pour, NP      . feeding supplement (NEPRO CARB STEADY) liquid 237 mL  237 mL Oral BID BM Algernon Huxley, MD   237 mL at 03/29/19 0952  . heparin injection 5,000 Units  5,000 Units Subcutaneous Q8H Algernon Huxley, MD   5,000 Units at 03/29/19 0532  . ipratropium-albuterol (DUONEB) 0.5-2.5 (3) MG/3ML nebulizer solution 3 mL  3 mL Nebulization Q6H PRN Algernon Huxley, MD   3 mL at 03/17/19 0818  . multivitamin (RENA-VIT) tablet 1 tablet  1 tablet Oral QHS Algernon Huxley, MD   1 tablet at 03/28/19 2107  . ondansetron (ZOFRAN) injection 4 mg  4 mg Intravenous Q6H PRN Algernon Huxley, MD      .  pantoprazole (PROTONIX) EC tablet 40 mg  40 mg Oral QHS Algernon Huxley, MD   40 mg at 03/28/19 2107  . polyethylene glycol (MIRALAX / GLYCOLAX) packet 17 g  17 g Per Tube Daily PRN Algernon Huxley, MD      . QUEtiapine (SEROQUEL) tablet 125 mg  125 mg Oral QPM Patrecia Pour, NP   125 mg at 03/28/19 2219  . sodium chloride flush (NS) 0.9 % injection 10-40 mL  10-40 mL Intracatheter Q12H Algernon Huxley, MD   3 mL at 03/29/19 0937  . sodium chloride flush (NS) 0.9 % injection 10-40 mL  10-40 mL Intracatheter PRN Algernon Huxley, MD   10 mL at 03/27/19 0912  . venlafaxine XR (EFFEXOR-XR) 24 hr capsule 150 mg  150 mg Oral Q breakfast Lavella Hammock, MD   150 mg at 03/29/19 8676    Musculoskeletal: Strength & Muscle Tone: decreased Gait & Station: Not assessed Patient leans: N/A  Psychiatric Specialty Exam: Physical Exam  Nursing note and vitals reviewed. Constitutional: He is oriented to person, place, and time. He appears well-developed and well-nourished. No distress.  HENT:  Head: Normocephalic and atraumatic.  Eyes: EOM are normal.  Neck: Normal range of  motion.  Cardiovascular: Regular rhythm.  Tachycardia  Respiratory:  Labored breathing on high flow oxygen via nasal cannula  Musculoskeletal: Normal range of motion.  Neurological: He is alert and oriented to person, place, and time.  Psychiatric: His speech is normal and behavior is normal. Judgment and thought content normal. His mood appears anxious. Cognition and memory are impaired. He exhibits a depressed mood.    Review of Systems  Constitutional: Positive for malaise/fatigue.  Respiratory: Negative.   Cardiovascular: Negative.  Negative for chest pain and palpitations.  Gastrointestinal: Positive for nausea.  Musculoskeletal: Positive for myalgias.  Neurological: Positive for weakness.  Psychiatric/Behavioral: Positive for depression. Negative for hallucinations, substance abuse and suicidal ideas. The patient is nervous/anxious. The patient does not have insomnia.     Blood pressure 128/81, pulse 81, temperature 98.3 F (36.8 C), temperature source Oral, resp. rate (!) 22, height 5\' 7"  (1.702 m), weight 78.4 kg, SpO2 100 %.Body mass index is 27.08 kg/m.  General Appearance: Fairly Groomed  Eye Contact:  Good  Speech:  Clear and Coherent  Volume:  Decreased  Mood:  Anxious and Depressed  Affect:  Depressed and Tearful  Thought Process:  Coherent  Orientation:  Full (Time, Place, and Person)  Thought Content:  Hallucinations: None  Suicidal Thoughts:  Denies currently, following suicide attempt by overdose  Homicidal Thoughts:  No  Memory:  NA  Judgement:  Poor  Insight:  Present and Shallow  Psychomotor Activity:  Decreased  Concentration:  Concentration: Fair  Recall:  [place] of Knowledge:  Good  Language:  Fair  Akathisia:  No  Handed:  Right  AIMS (if indicated):     Assets:  Communication Skills Desire for Improvement Social Support patient has given consent to talk to mother, [name] [phone]  ADL's:  Impaired  Cognition:  WNL  Sleep:    decreased     Treatment Plan Summary: Daily contact with patient to assess and evaluate symptoms and progress in treatment and Medication management  Major depressive disorder, recurrent, severe without psychosis: -Continue venlafaxine XR are 150 mg daily for depression and anxiety.  Insomnia: -Increased Seroquel from 125 mg to 150 mg -Admit to inpatient when medically cleared  Anxiety/Insomnia: -Increase Seroquel 150 mg at  bedtime   Disposition: Recommend psychiatric Inpatient admission when medically cleared. Supportive therapy provided about ongoing stressors.  patient has given consent to talk to mother, [name] [phone]  Psychiatry will continue to follow and advise medication management as indicated.  Waylan Boga, NP 03/29/2019 10:57 AM

## 2019-03-29 NOTE — Plan of Care (Signed)
  Problem: Clinical Measurements: Goal: Remain free from any harm during hospitalization Outcome: Progressing   Problem: Coping: Goal: Ability to disclose and discuss thoughts of suicide and self-harm will improve Outcome: Progressing   Problem: Sleep Hygiene: Goal: Ability to obtain adequate restful sleep will improve Outcome: Progressing   Problem: Education: Goal: Knowledge of General Education information will improve Description: Including pain rating scale, medication(s)/side effects and non-pharmacologic comfort measures Outcome: Progressing   Problem: Activity: Goal: Risk for activity intolerance will decrease Outcome: Progressing   Problem: Coping: Goal: Level of anxiety will decrease Outcome: Progressing

## 2019-03-30 ENCOUNTER — Other Ambulatory Visit: Payer: Self-pay

## 2019-03-30 ENCOUNTER — Inpatient Hospital Stay
Admission: AD | Admit: 2019-03-30 | Discharge: 2019-04-03 | DRG: 885 | Disposition: A | Discharge status: Home / Self Care | Payer: BC Managed Care – PPO | Source: Intra-hospital | Attending: Psychiatry | Admitting: Psychiatry

## 2019-03-30 DIAGNOSIS — Z801 Family history of malignant neoplasm of trachea, bronchus and lung: Secondary | ICD-10-CM | POA: Diagnosis not present

## 2019-03-30 DIAGNOSIS — T1491XA Suicide attempt, initial encounter: Secondary | ICD-10-CM | POA: Diagnosis present

## 2019-03-30 DIAGNOSIS — E785 Hyperlipidemia, unspecified: Secondary | ICD-10-CM | POA: Diagnosis present

## 2019-03-30 DIAGNOSIS — D649 Anemia, unspecified: Secondary | ICD-10-CM | POA: Diagnosis present

## 2019-03-30 DIAGNOSIS — Z87448 Personal history of other diseases of urinary system: Secondary | ICD-10-CM | POA: Diagnosis not present

## 2019-03-30 DIAGNOSIS — K219 Gastro-esophageal reflux disease without esophagitis: Secondary | ICD-10-CM | POA: Diagnosis present

## 2019-03-30 DIAGNOSIS — I10 Essential (primary) hypertension: Secondary | ICD-10-CM | POA: Diagnosis present

## 2019-03-30 DIAGNOSIS — E669 Obesity, unspecified: Secondary | ICD-10-CM | POA: Diagnosis present

## 2019-03-30 DIAGNOSIS — F410 Panic disorder [episodic paroxysmal anxiety] without agoraphobia: Secondary | ICD-10-CM | POA: Diagnosis present

## 2019-03-30 DIAGNOSIS — N179 Acute kidney failure, unspecified: Secondary | ICD-10-CM | POA: Diagnosis present

## 2019-03-30 DIAGNOSIS — Z452 Encounter for adjustment and management of vascular access device: Secondary | ICD-10-CM | POA: Diagnosis not present

## 2019-03-30 DIAGNOSIS — G4700 Insomnia, unspecified: Secondary | ICD-10-CM | POA: Diagnosis present

## 2019-03-30 DIAGNOSIS — Z6826 Body mass index (BMI) 26.0-26.9, adult: Secondary | ICD-10-CM

## 2019-03-30 DIAGNOSIS — T50911A Poisoning by multiple unspecified drugs, medicaments and biological substances, accidental (unintentional), initial encounter: Secondary | ICD-10-CM | POA: Diagnosis present

## 2019-03-30 DIAGNOSIS — Z9049 Acquired absence of other specified parts of digestive tract: Secondary | ICD-10-CM | POA: Diagnosis not present

## 2019-03-30 DIAGNOSIS — F332 Major depressive disorder, recurrent severe without psychotic features: Secondary | ICD-10-CM | POA: Diagnosis not present

## 2019-03-30 DIAGNOSIS — T50902A Poisoning by unspecified drugs, medicaments and biological substances, intentional self-harm, initial encounter: Secondary | ICD-10-CM | POA: Diagnosis present

## 2019-03-30 MED ORDER — PANTOPRAZOLE SODIUM 40 MG PO TBEC: 40.0000 mg | DELAYED_RELEASE_TABLET | Freq: Every day | ORAL | 0 refills | Status: DC

## 2019-03-30 MED ORDER — QUETIAPINE FUMARATE 25 MG PO TABS
125.0000 mg | ORAL_TABLET | Freq: Every evening | ORAL | Status: DC
  Administered 2019-03-30: 125 mg via ORAL
  Filled 2019-03-30: qty 1

## 2019-03-30 MED ORDER — ONDANSETRON HCL 4 MG/2ML IJ SOLN
4.0000 mg | Freq: Four times a day (QID) | INTRAMUSCULAR | Status: DC | PRN
  Filled 2019-03-30: qty 2

## 2019-03-30 MED ORDER — NEPRO/CARBSTEADY PO LIQD: 237.0000 mL | Freq: Two times a day (BID) | ORAL | Status: DC

## 2019-03-30 MED ORDER — POLYETHYLENE GLYCOL 3350 17 G PO PACK: 17.0000 g | PACK | Freq: Every day | ORAL | Status: DC | PRN

## 2019-03-30 MED ORDER — ALUM & MAG HYDROXIDE-SIMETH 200-200-20 MG/5ML PO SUSP: 30.0000 mL | ORAL | Status: DC | PRN

## 2019-03-30 MED ORDER — ASPIRIN EC 325 MG PO TBEC
325.0000 mg | DELAYED_RELEASE_TABLET | Freq: Every day | ORAL | Status: DC
  Administered 2019-03-30 – 2019-04-02 (×3): 325 mg via ORAL
  Filled 2019-03-30 (×4): qty 1

## 2019-03-30 MED ORDER — ACETAMINOPHEN 325 MG PO TABS
650.0000 mg | ORAL_TABLET | Freq: Four times a day (QID) | ORAL | Status: DC | PRN
  Administered 2019-03-31 – 2019-04-03 (×5): 650 mg via ORAL
  Filled 2019-03-30 (×6): qty 2

## 2019-03-30 MED ORDER — CLONAZEPAM 0.5 MG PO TABS
0.5000 mg | ORAL_TABLET | Freq: Every day | ORAL | Status: DC | PRN
  Administered 2019-03-30 – 2019-04-02 (×4): 0.5 mg via ORAL
  Filled 2019-03-30 (×4): qty 1

## 2019-03-30 MED ORDER — RENA-VITE PO TABS
1.0000 | ORAL_TABLET | Freq: Every day | ORAL | Status: DC
  Administered 2019-03-30: 1 via ORAL
  Filled 2019-03-30 (×2): qty 1

## 2019-03-30 MED ORDER — VENLAFAXINE HCL ER 150 MG PO CP24: 150.0000 mg | ORAL_CAPSULE | Freq: Every day | ORAL | 0 refills | Status: DC

## 2019-03-30 MED ORDER — VENLAFAXINE HCL ER 75 MG PO CP24
150.0000 mg | ORAL_CAPSULE | Freq: Every day | ORAL | Status: DC
  Administered 2019-03-31 – 2019-04-03 (×4): 150 mg via ORAL
  Filled 2019-03-30 (×4): qty 2

## 2019-03-30 MED ORDER — CHLORHEXIDINE GLUCONATE CLOTH 2 % EX PADS
6.0000 | MEDICATED_PAD | Freq: Every day | CUTANEOUS | Status: DC
  Administered 2019-03-31: 6 via TOPICAL

## 2019-03-30 MED ORDER — HEPARIN SODIUM (PORCINE) 5000 UNIT/ML IJ SOLN
5000.0000 [IU] | Freq: Three times a day (TID) | INTRAMUSCULAR | Status: DC
  Administered 2019-03-30 – 2019-04-02 (×7): 5000 [IU] via SUBCUTANEOUS
  Filled 2019-03-30 (×10): qty 1

## 2019-03-30 MED ORDER — MAGNESIUM HYDROXIDE 400 MG/5ML PO SUSP: 30.0000 mL | Freq: Every day | ORAL | Status: DC | PRN

## 2019-03-30 MED ORDER — QUETIAPINE FUMARATE 25 MG PO TABS: 125.0000 mg | ORAL_TABLET | Freq: Every evening | ORAL | 0 refills | Status: DC

## 2019-03-30 MED ORDER — PANTOPRAZOLE SODIUM 40 MG PO TBEC
40.0000 mg | DELAYED_RELEASE_TABLET | Freq: Every day | ORAL | Status: DC
  Administered 2019-03-30 – 2019-04-02 (×4): 40 mg via ORAL
  Filled 2019-03-30 (×4): qty 1

## 2019-03-30 NOTE — Progress Notes (Signed)
   03/30/19 1000  Clinical Encounter Type  Visited With Patient  Visit Type Follow-up;Spiritual support  Referral From Chaplain  Consult/Referral To Chaplain  Spiritual Encounters  Spiritual Needs Prayer;Emotional  Stress Factors  Patient Stress Factors Major life changes  Family Stress Factors None identified  Chaplain visit patient he was sitting up in bed with a big smile on his face. Patient greeted Chaplain with a hello that was so amazing. Patient was concerned about if he would go to [facility] today or not. He had questions about what to expect.Patient is excited about moving forward in his process. Patient believes that he can start a treatment plan that will help him add to his own thoughts of coping with the trauma of life. 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 He feels that the county did not do their part with helping him debrief. Patient has really impressed the Chaplain by being presence in his process. He has shown up with accountability of his part in his journey. This is an assessment from the initial visit. Patient has improved emotionally. Chaplain will follow patient to [facility].

## 2019-03-30 NOTE — Discharge Summary (Signed)
[facility] NAME: Glenn Rasmussen    MR#:  [id number]  DATE OF BIRTH:  05-16-1991  DATE OF ADMISSION:  03/08/2019 ADMITTING PHYSICIAN: Lance Coon, MD  DATE OF DISCHARGE: 03/30/2019--pt being transferred to inpt psych unit  PRIMARY CARE PHYSICIAN: Maeola Sarah, MD    ADMISSION DIAGNOSIS:  Suicidal ideation [R45.851] Suicide attempt [facility]) [T14.91XA] Intentional benzodiazepine overdose, initial encounter ([place]) [T42.4X2A] Calcium channel blocker overdose, intentional self-harm, initial encounter ([place]) [T46.1X2A] Antihypertensive agent overdose, intentional self-harm, initial encounter ([place]) [T46.5X2A] Hypotension, unspecified hypotension type [I95.9] Depression, unspecified depression type [F32.9]  DISCHARGE DIAGNOSIS:  *acute hypoxic respiratory failure due to pulmonary edema/ARDS requiring mechanical ventilation *intentional drug overdose-- polypharmacy *acute non-nonoliguric renal failure/ATN from hypotension requiring temporary CR RT and hemodialysis *pneumonia resolved  SECONDARY DIAGNOSIS:   Past Medical History:  Diagnosis Date  . Hypertension   . IBS (irritable bowel syndrome)     HOSPITAL COURSE:    28 year old male with past medical history of hypertension, IBS, depression who presented to the hospital due to altered mental status secondary to drug overdose and also noted to be in acute kidney injury.  1.Acute Hypoxic respiratory failure due to volume overload/ARDS and now resolved. -Patient was extubated on 03/12/2019--re-intubated on 03/19/2019, then extubated on 03/23/2019. -Weaned off BiPAP now respiratory status stable.   -Continue fluid removal as per hemodialysis as needed. Pt making urine about 1900 cc over 24 hours - currently on RA and doing well.   2. Intentional drug overdose -patient overdosed on 38 tablets of Phenergan, 19 tablets of losartan, 42 tablets of Norvasc and 78 tablets of Klonopin -  Urine tox positive for benzos and tricyclics - Appreciate psych consult.  patient is medically best at baseline at present/ stable for transfer to inpatient psych  -will defer oral anxiety meds per psych. -d/c iv ativan and haldol  3. Acute non oliguric renal failure-likely ATN from hypotension.  -Initially patient was on CRRT but now has been weaned over to hemodialysis. -Status post PermCath June 19th, 2020--leave it for now till final decision made by Nephrology  -Continue dialysis as per nephrology on prn bases -labs stable -- creat 2.56 -making good urine  4. Pneumonia - resolved and off abx now.   5. Anemiadue to anemia of chronic disease  - Hg stable  - no need for transfusion.   6. GERD-Protonix  7. Elevated troponin-likely demand ischemia. Most recent echo this admission showing normal LV function and no wall motion abnormalities.  D/c to inpatient rehab today when bed available. Pt agreeable  CONSULTS OBTAINED:  Treatment Team:  Murlean Iba, MD Lavella Hammock, MD  DRUG ALLERGIES:  No Known Allergies  DISCHARGE MEDICATIONS:   Allergies as of 03/30/2019   No Known Allergies     Medication List    STOP taking these medications   amLODipine 10 MG tablet Commonly known as: NORVASC   Eszopiclone 3 MG Tabs   hydrochlorothiazide 12.5 MG tablet Commonly known as: HYDRODIURIL   hydrOXYzine 25 MG tablet Commonly known as: ATARAX/VISTARIL   LaMICtal Starter 42 x 25 MG & 7 x 100 MG Kit Generic drug: Lamotrigine   losartan 100 MG tablet Commonly known as: COZAAR   promethazine 25 MG tablet Commonly known as: PHENERGAN   rizatriptan 10 MG tablet Commonly known as: MAXALT   Vyvanse 70 MG capsule Generic drug: lisdexamfetamine     TAKE these medications   clonazePAM 0.5 MG tablet Commonly known as: Bobbye [place]  Take 0.5-1 mg by mouth See admin instructions. Take 1 tablet (0.'5mg'$ ) by mouth every morning and 2 tablets ('1mg'$ ) by mouth every  night   dicyclomine 20 MG tablet Commonly known as: BENTYL Take 20 mg by mouth every 6 (six) hours as needed for spasms.   pantoprazole 40 MG tablet Commonly known as: PROTONIX Take 1 tablet (40 mg total) by mouth at bedtime.   QUEtiapine 25 MG tablet Commonly known as: SEROQUEL Take 5 tablets (125 mg total) by mouth every evening.   venlafaxine XR 150 MG 24 hr capsule Commonly known as: EFFEXOR-XR Take 1 capsule (150 mg total) by mouth daily with breakfast. Start taking on: March 31, 2019 What changed:   medication strength  how much to take  when to take this       If you experience worsening of your admission symptoms, develop shortness of breath, life threatening emergency, suicidal or homicidal thoughts you must seek medical attention immediately by calling 911 or calling your MD immediately  if symptoms less severe.  You Must read complete instructions/literature along with all the possible adverse reactions/side effects for all the Medicines you take and that have been prescribed to you. Take any new Medicines after you have completely understood and accept all the possible adverse reactions/side effects.   Please note  You were cared for by a hospitalist during your hospital stay. If you have any questions about your discharge medications or the care you received while you were in the hospital after you are discharged, you can call the unit and asked to speak with the hospitalist on call if the hospitalist that took care of you is not available. Once you are discharged, your primary care physician will handle any further medical issues. Please note that NO REFILLS for any discharge medications will be authorized once you are discharged, as it is imperative that you return to your primary care physician (or establish a relationship with a primary care physician if you do not have one) for your aftercare needs so that they can reassess your need for medications and monitor  your lab values. Today   SUBJECTIVE    No new complaitns VITAL SIGNS:  Blood pressure 133/89, pulse 78, temperature 97.9 F (36.6 C), temperature source Oral, resp. rate 19, height '5\' 7"'$  (1.702 m), weight 78.1 kg, SpO2 99 %.  I/O:    Intake/Output Summary (Last 24 hours) at 03/30/2019 1142 Last data filed at 03/30/2019 1000 Gross per 24 hour  Intake 480 ml  Output 1550 ml  Net -1070 ml    PHYSICAL EXAMINATION:  GENERAL:  28 y.o.-year-old patient lying in the bed with no acute distress. obese EYES: Pupils equal, round, reactive to light and accommodation. No scleral icterus. Extraocular muscles intact.  HEENT: Head atraumatic, normocephalic. Oropharynx and nasopharynx clear.  NECK:  Supple, no jugular venous distention. No thyroid enlargement, no tenderness.  LUNGS: Normal breath sounds bilaterally, no wheezing, rales,rhonchi or crepitation. No use of accessory muscles of respiration.  CARDIOVASCULAR: S1, S2 normal. No murmurs, rubs, or gallops.  ABDOMEN: Soft, non-tender, non-distended. Bowel sounds present. No organomegaly or mass.  EXTREMITIES: No pedal edema, cyanosis, or clubbing.  NEUROLOGIC: Cranial nerves II through XII are intact. Muscle strength 5/5 in all extremities. Sensation intact. Gait not checked.  PSYCHIATRIC: The patient is alert and oriented x 3.  SKIN: No obvious rash, lesion, or ulcer.   DATA REVIEW:   CBC  Recent Labs  Lab 03/27/19 0556  WBC 13.9*  HGB  9.0*  HCT 26.7*  PLT 362    Chemistries  Recent Labs  Lab 03/29/19 0614  NA 135  K 3.8  CL 100  CO2 22  GLUCOSE 83  BUN 56*  CREATININE 2.56*  CALCIUM 9.5  MG 2.1    Microbiology Results   No results found for this or any previous visit (from the past 240 hour(s)).  RADIOLOGY:  No results found.   CODE STATUS:     Code Status Orders  (From admission, onward)         Start     Ordered   03/17/19 0950  Full code  Continuous     03/17/19 0949        Code Status History     Date Active Date Inactive Code Status Order ID Comments User Context   03/11/2019 0031 03/12/2019 1021 DNR [id number]  [name], NP Inpatient   03/09/2019 0245 03/11/2019 0031 Full Code [id number]  Lance Coon, MD Inpatient   Advance Care Planning Activity      TOTAL TIME TAKING CARE OF THIS PATIENT: *40* minutes.    Fritzi Mandes M.D on 03/30/2019 at 11:42 AM  Between 7am to 6pm - Pager - [phone] After 6pm go to [email or web address] - password [facility]  Office  [phone]  CC: Primary care physician; Maeola Sarah, MD

## 2019-03-30 NOTE — Progress Notes (Signed)
Admission Note:   Report was received from [place], [place] on a 28 year male who presents IVC in no acute distress for the treatment of SI and Depression. 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 Patient also states that he has been having trouble sleeping. Patient appears flat and depressed, but was calm and cooperative with admission process. Patient rated his depression a "2/10" and his anxiety a "6/10" stating that "this new thing right now" is why he's feeling this way. Patient denies any signs/symptoms of SI/HI/AVH and pain to this Probation officer. Patient's goals while he's here is "helping with coping mechanisms and sleeping issues". Patient has a past medical history of HTN. Skin was assessed with T'Y[place], RN and found to be clear of any abnormal marks apart from acne to his back. Patient searched and no contraband found and unit policies explained and understanding verbalized. Consents obtained. Food and fluids offered, and fluids accepted. Patient had no additional questions or concerns at this time.

## 2019-03-30 NOTE — BH Assessment (Addendum)
Patient has been accepted to [facility].  Accepting physician is Dr. Einar Grad.  Attending Physician will be Dr. Weber Cooks.  Patient has been assigned to room 305, by [place] Charge Nurse.  Call report to [phone].  Representative/Transfer Coordinator is Print production planner [facility] TTS) Patient pre-admitted by [facility] Patient Access Butch Penny)   Unit (2A) staff made aware of acceptance.

## 2019-03-30 NOTE — Progress Notes (Signed)
   03/30/19 1500  Clinical Encounter Type  Visited With Patient  Visit Type Follow-up;Spiritual support  Referral From Chaplain  Consult/Referral To Chaplain  Spiritual Encounters  Spiritual Needs Emotional  Stress Factors  Patient Stress Factors Major life changes  Chaplain visit patient to see if he had been transferred to [facility]. He had not left yet still waiting. Patient is very nervous about the move. Chaplain gave encouraging words and told him she will check on him shortly. Chaplain will give patient time to transfer.

## 2019-03-30 NOTE — Tx Team (Signed)
Initial Treatment Plan 03/30/2019 5:44 PM Glenn Rasmussen OVP:[id number]    PATIENT STRESSORS: Marital or family conflict Traumatic event   PATIENT STRENGTHS: General fund of knowledge Motivation for treatment/growth Supportive family/friends Work skills   PATIENT IDENTIFIED PROBLEMS: PTSD from being a Paramedic  Family issues  Anxiety  Depression               DISCHARGE CRITERIA:  Ability to meet basic life and health needs Improved stabilization in mood, thinking, and/or behavior Motivation to continue treatment in a less acute level of care Reduction of life-threatening or endangering symptoms to within safe limits  PRELIMINARY DISCHARGE PLAN: Outpatient therapy Return to previous living arrangement Return to previous work or school arrangements  PATIENT/FAMILY INVOLVEMENT: This treatment plan has been presented to and reviewed with the patient, Glenn Rasmussen. The patient has been given the opportunity to ask questions and make suggestions.  [name], RN 03/30/2019, 5:44 PM

## 2019-03-30 NOTE — Progress Notes (Signed)
Pastoral Care Visit   03/30/19 1700  Clinical Encounter Type  Visited With Patient  Visit Type Follow-up;Psychological support  Referral From Chaplain  Consult/Referral To Chaplain  Spiritual Encounters  Spiritual Needs Emotional  Stress Factors  Patient Stress Factors Major life changes   Chap visited pt while he was sitting in [place] room.  Pt was eager for pastoral care visit.  Pt presents as bright, happy, and confident.  Pt expressed that while he was concerned about coming to [facility], it is actually way better than he expected.  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 This chap asked if telling the stories helped and pt indicated that retelling the stories helps him cope with the trauma. This chap believes pt suffers from PTSD and would benefit from long-term cognitive therapy to better cope with what he has witnessed. Pt was very appreciative of visit.  This chap indicated that he would visit again on Thursday.  Pt was thankful.  Darcey Nora, Chaplain

## 2019-03-30 NOTE — Progress Notes (Signed)
[facility]  ROUNDING NOTE   Subjective:   Patient is doing well.  Urine output 1500 cc Serum creatinine down to 2.56 No edema Patient states he is very thirsty and is drinking lots of fluid.  Requesting fluid restriction to be removed  Objective:  Vital signs in last 24 hours:  Temp:  [97.9 F (36.6 C)-99.5 F (37.5 C)] 97.9 F (36.6 C) (06/22 0719) Pulse Rate:  [77-82] 78 (06/22 0719) Resp:  [16-19] 19 (06/22 0719) BP: (116-140)/(77-93) 133/89 (06/22 0719) SpO2:  [97 %-99 %] 99 % (06/22 0719) Weight:  [78.1 kg] 78.1 kg (06/22 0715)  Weight change:  Filed Weights   03/28/19 0500 03/29/19 0452 03/30/19 0715  Weight: 77.4 kg 78.4 kg 78.1 kg    Intake/Output: I/O last 3 completed shifts: In: 59 [P.O.:1480] Out: 3000 [Urine:3000]   Intake/Output this shift:  Total I/O In: 240 [P.O.:240] Out: 300 [Urine:300]  Physical Exam: General: Sitting in bed  Head: /AT  Eyes: Anicteric, PERRL  Neck: Trachea midline  Lungs:  clear  Heart: regular  Abdomen:  Soft, obese   Extremities:  no peripheral edema.  Neurologic: Nonfocal, moving all four extremities  Skin: No lesions  Access: RIJ permcath 8/58    Basic Metabolic Panel: Recent Labs  Lab 03/24/19 0446 03/25/19 0512 03/26/19 0503 03/27/19 0556 03/28/19 0726 03/29/19 0614  NA 145 152* 144 133*  --  135  K 4.9 4.4 3.7 3.6  --  3.8  CL 102 109 101 95*  --  100  CO2 25 23 27 24   --  22  GLUCOSE 124* 136* 116* 97  --  83  BUN 99* 129* 85* 88*  --  56*  CREATININE 5.04* 5.60* 4.28* 3.65*  --  2.56*  CALCIUM 8.3* 8.7* 9.2 8.8*  --  9.5  MG 2.5* 2.7* 2.5* 1.9 1.9 2.1  PHOS  --  8.7*  --   --   --  3.7    Liver Function Tests: Recent Labs  Lab 03/25/19 0512 03/29/19 0614  ALBUMIN 3.9 4.6   No results for input(s): LIPASE, AMYLASE in the last 168 hours. No results for input(s): AMMONIA in the last 168 hours.  CBC: Recent Labs  Lab 03/25/19 0512 03/26/19 0503 03/27/19 0556  WBC 13.1*  16.6* 13.9*  NEUTROABS  --  12.3* 8.7*  HGB 7.3* 8.7* 9.0*  HCT 22.3* 26.2* 26.7*  MCV 83.5 83.7 80.9  PLT 375 390 362    Cardiac Enzymes: Recent Labs  Lab 03/24/19 1415  TROPONINI 0.06*    BNP: Invalid input(s): POCBNP  CBG: Recent Labs  Lab 03/28/19 0808 03/28/19 1158 03/28/19 1630 03/28/19 2054 03/29/19 0744  GLUCAP 116* 79 133* 91 104*    Microbiology: Results for orders placed or performed during the hospital encounter of 03/08/19  SARS Coronavirus 2 (CEPHEID - Performed in [place] hospital lab), Hosp Order     Status: None   Collection Time: 03/08/19 11:48 PM   Specimen: Nasopharyngeal Swab  Result Value Ref Range Status   SARS Coronavirus 2 NEGATIVE NEGATIVE Final    Comment: (NOTE) If result is NEGATIVE SARS-CoV-2 target nucleic acids are NOT DETECTED. The SARS-CoV-2 RNA is generally detectable in upper and lower  respiratory specimens during the acute phase of infection. The lowest  concentration of SARS-CoV-2 viral copies this assay can detect is 250  copies / mL. A negative result does not preclude SARS-CoV-2 infection  and should not be used as the sole basis for  treatment or other  patient management decisions.  A negative result may occur with  improper specimen collection / handling, submission of specimen other  than nasopharyngeal swab, presence of viral mutation(s) within the  areas targeted by this assay, and inadequate number of viral copies  (<250 copies / mL). A negative result must be combined with clinical  observations, patient history, and epidemiological information. If result is POSITIVE SARS-CoV-2 target nucleic acids are DETECTED. The SARS-CoV-2 RNA is generally detectable in upper and lower  respiratory specimens dur ing the acute phase of infection.  Positive  results are indicative of active infection with SARS-CoV-2.  Clinical  correlation with patient history and other diagnostic information is  necessary to determine  patient infection status.  Positive results do  not rule out bacterial infection or co-infection with other viruses. If result is PRESUMPTIVE POSTIVE SARS-CoV-2 nucleic acids MAY BE PRESENT.   A presumptive positive result was obtained on the submitted specimen  and confirmed on repeat testing.  While 2019 novel coronavirus  (SARS-CoV-2) nucleic acids may be present in the submitted sample  additional confirmatory testing may be necessary for epidemiological  and / or clinical management purposes  to differentiate between  SARS-CoV-2 and other Sarbecovirus currently known to infect humans.  If clinically indicated additional testing with an alternate test  methodology [phone]) is advised. The SARS-CoV-2 RNA is generally  detectable in upper and lower respiratory sp ecimens during the acute  phase of infection. The expected result is Negative. Fact Sheet for Patients:  StrictlyIdeas.no Fact Sheet for Healthcare Providers: BankingDealers.co.za This test is not yet approved or cleared by the [place] FDA and has been authorized for detection and/or diagnosis of SARS-CoV-2 by FDA under an Emergency Use Authorization (EUA).  This EUA will remain in effect (meaning this test can be used) for the duration of the COVID-19 declaration under Section 564(b)(1) of the Act, 21 U.S.C. section 360bbb-3(b)(1), unless the authorization is terminated or revoked sooner. Performed at [facility], [address] [place], [place] [address]   MRSA PCR Screening     Status: None   Collection Time: 03/09/19  2:48 AM   Specimen: Nasal Mucosa; Nasopharyngeal  Result Value Ref Range Status   MRSA by PCR NEGATIVE NEGATIVE Final    Comment:        The GeneXpert MRSA Assay (FDA approved for NASAL specimens only), is one component of a comprehensive MRSA colonization surveillance program. It is not intended to diagnose MRSA infection nor to  guide or monitor treatment for MRSA infections. Performed at [facility], [address] [place], [place] [address]   Culture, respiratory (non-expectorated)     Status: None   Collection Time: 03/09/19  8:00 AM   Specimen: Tracheal Aspirate; Respiratory  Result Value Ref Range Status   Specimen Description   Final    TRACHEAL ASPIRATE Performed at [facility], [address] [place], [place] [address]    Special Requests   Final    NONE Performed at [facility], [address] [place], [place] [address]    Gram Stain   Final    ABUNDANT WBC PRESENT,BOTH PMN AND MONONUCLEAR MODERATE GRAM POSITIVE COCCI RARE YEAST    Culture   Final    MODERATE GROUP B STREP(S.AGALACTIAE)ISOLATED TESTING AGAINST S. AGALACTIAE NOT ROUTINELY PERFORMED DUE TO PREDICTABILITY OF AMP/PEN/VAN SUSCEPTIBILITY. Performed at [facility], [place] [address] [place],  [address]    Report Status 03/11/2019 FINAL  Final  CULTURE, BLOOD (ROUTINE X 2) w Reflex to ID Panel     Status: None   Collection Time: 03/09/19  2:53 PM   Specimen: BLOOD  Result Value Ref Range Status   Specimen Description   Final    BLOOD BLOOD RIGHT ARM Performed at [facility], [address] [place], [place] [address]    Special Requests   Final    BOTTLES DRAWN AEROBIC AND ANAEROBIC Blood Culture results may not be optimal due to an excessive volume of blood received in culture bottles Performed at [facility], [address] [place], [place] [address]    Culture   Final    NO GROWTH 5 DAYS Performed at [facility], [place] [address] [place], [place] [address]    Report Status 03/17/2019 FINAL  Final  CULTURE, BLOOD (ROUTINE X 2) w Reflex to ID Panel     Status: None   Collection Time: 03/09/19  5:00 PM   Specimen: BLOOD RIGHT ARM  Result Value Ref Range Status   Specimen Description   Final    BLOOD RIGHT ARM Performed at [facility],  [address] [place], [place] [address]    Special Requests   Final    BOTTLES DRAWN AEROBIC AND ANAEROBIC Blood Culture results may not be optimal due to an excessive volume of blood received in culture bottles Performed at [facility], [address] [place], [place] [address]    Culture   Final    NO GROWTH 5 DAYS Performed at [facility], [place] [address] [place], [place] [address]    Report Status 03/15/2019 FINAL  Final  Urine Culture     Status: None   Collection Time: 03/11/19  4:33 AM   Specimen: Nasal Mucosa; Urine  Result Value Ref Range Status   Specimen Description   Final    URINE, RANDOM Performed at [facility], [address] [place], [place] [address]    Special Requests   Final    NONE Performed at [facility], [address], [place], [place] [address]    Culture   Final    NO GROWTH Performed at [facility], [place] [address], [place], [place] [address]    Report Status 03/12/2019 FINAL  Final    Coagulation Studies: No results for input(s): LABPROT, INR in the last 72 hours.  Urinalysis: No results for input(s): COLORURINE, LABSPEC, PHURINE, GLUCOSEU, HGBUR, BILIRUBINUR, KETONESUR, PROTEINUR, UROBILINOGEN, NITRITE, LEUKOCYTESUR in the last 72 hours.  Invalid input(s): APPERANCEUR    Imaging: No results found.   Medications:   . sodium chloride Stopped (03/18/19 1430)   . aspirin EC  325 mg Oral Daily  . Chlorhexidine Gluconate Cloth  6 each Topical Q0600  . feeding supplement (NEPRO CARB STEADY)  237 mL Oral BID BM  . heparin injection (subcutaneous)  5,000 Units Subcutaneous Q8H  . multivitamin  1 tablet Oral QHS  . pantoprazole  40 mg Oral QHS  . QUEtiapine  125 mg Oral QPM  . sodium chloride flush  10-40 mL Intracatheter Q12H  . venlafaxine XR  150 mg Oral Q breakfast   sodium chloride, acetaminophen, bisacodyl, clonazePAM, ipratropium-albuterol, ondansetron (ZOFRAN) IV, polyethylene  glycol, sodium chloride flush  Assessment/ Plan:  Mr. Glenn Rasmussen is a 28 y.o. white male with hypertension, irritable bowel syndrome, depression with overdose. Complicated with acute respiratory failure requiring mechanical ventilation, ileus and acute renal failure requiring  renal replacement therapy. Now with acute renal failure requiring hemodialysis.   1. Acute renal failure: CRRT on 6/1 -6/6. First intermittent hemodialysis treatment was 6/8. Hemodialysis treatments on 6/10, 6/12, 6/13, 6/15, 6/17 and 6/19.  Permcath used for the first time on 6/19 Nonoliguric urine output.   Creatinine on admission 1.26, normal GFR Nonoliguric urine output.  >1500 cc - Hold diuretics.  -Renal function has recovered nicely.  Serum creatinine improved to 2.56 -If serum creatinine continues to improve and urine output remains good, will get the PermCath removed by the end of the week.  2. Anemia with renal failure:   Lab Results  Component Value Date   HGB 9.0 (L) 03/27/2019   Monitor closely  3. Depression:  - Appreciate psych input.    LOS: [place] 6/22/202011:00 AM

## 2019-03-30 NOTE — Plan of Care (Signed)
New admission.  Problem: Education: Goal: Knowledge of [facility] Education information/materials will improve Outcome: Not Progressing Goal: Emotional status will improve Outcome: Not Progressing Goal: Mental status will improve Outcome: Not Progressing Goal: Verbalization of understanding the information provided will improve Outcome: Not Progressing   Problem: Health Behavior/Discharge Planning: Goal: Compliance with treatment plan for underlying cause of condition will improve Outcome: Not Progressing   Problem: Safety: Goal: Periods of time without injury will increase Outcome: Not Progressing   Problem: Coping: Goal: Coping ability will improve Outcome: Not Progressing Goal: Will verbalize feelings Outcome: Not Progressing   Problem: Self-Concept: Goal: Ability to identify factors that promote anxiety will improve Outcome: Not Progressing Goal: Level of anxiety will decrease Outcome: Not Progressing

## 2019-03-30 NOTE — Progress Notes (Signed)
   03/30/19 1600  Clinical Encounter Type  Visited With Patient not available  Visit Type Follow-up;Spiritual support  Referral From Chaplain  Consult/Referral To Chaplain  Spiritual Encounters  Spiritual Needs Emotional  Stress Factors  Patient Stress Factors Major life changes  Chaplain went to visit patient, and he was still being assess. Chaplain will try to return before leaving today if not will have on call Chaplain to follow up on patient's transfer.

## 2019-03-31 DIAGNOSIS — F332 Major depressive disorder, recurrent severe without psychotic features: Principal | ICD-10-CM

## 2019-03-31 LAB — BASIC METABOLIC PANEL
Anion gap: 12 (ref 5–15)
BUN: 42 mg/dL — ABNORMAL HIGH (ref 6–20)
CO2: 19 mmol/L — ABNORMAL LOW (ref 22–32)
Calcium: 9.6 mg/dL (ref 8.9–10.3)
Chloride: 106 mmol/L (ref 98–111)
Creatinine, Ser: 2.17 mg/dL — ABNORMAL HIGH (ref 0.61–1.24)
GFR calc Af Amer: 46 mL/min — ABNORMAL LOW (ref >60)
GFR calc non Af Amer: 39 mL/min — ABNORMAL LOW (ref >60)
Glucose, Bld: 93 mg/dL (ref 70–99)
Potassium: 4.4 mmol/L (ref 3.5–5.1)
Sodium: 137 mmol/L (ref 135–145)

## 2019-03-31 MED ORDER — ONDANSETRON 4 MG PO TBDP
4.0000 mg | ORAL_TABLET | Freq: Four times a day (QID) | ORAL | Status: DC | PRN
  Administered 2019-03-31 – 2019-04-02 (×3): 4 mg via ORAL
  Filled 2019-03-31 (×3): qty 1

## 2019-03-31 MED ORDER — IBUPROFEN 600 MG PO TABS
600.0000 mg | ORAL_TABLET | Freq: Four times a day (QID) | ORAL | Status: DC | PRN
  Administered 2019-03-31: 600 mg via ORAL
  Filled 2019-03-31: qty 1

## 2019-03-31 MED ORDER — ENSURE ENLIVE PO LIQD
237.0000 mL | Freq: Three times a day (TID) | ORAL | Status: DC
  Administered 2019-04-01 (×2): 237 mL via ORAL

## 2019-03-31 MED ORDER — ADULT MULTIVITAMIN W/MINERALS CH
1.0000 | ORAL_TABLET | Freq: Every day | ORAL | Status: DC
  Administered 2019-03-31 – 2019-04-03 (×4): 1 via ORAL
  Filled 2019-03-31 (×4): qty 1

## 2019-03-31 MED ORDER — TRAZODONE HCL 100 MG PO TABS
200.0000 mg | ORAL_TABLET | Freq: Every day | ORAL | Status: DC
  Administered 2019-03-31 – 2019-04-02 (×3): 200 mg via ORAL
  Filled 2019-03-31 (×5): qty 2

## 2019-03-31 NOTE — Evaluation (Signed)
Occupational Therapy Evaluation Patient Details Name: Glenn Rasmussen MRN: [id number] DOB: [birth year] Today's Date: 03/31/2019    History of Present Illness Glenn Rasmussen is a 89yoM with a past medical history of irritable bowel syndrome, hypertension and major depressive disorder who presented to the [facility] emergency department on Mar 08, 2019 after a suicide attempt.  At the time, the patient admitted taking 38 Phenergan, 19 losartan, 42 amlodipine, 78 Klonopin pills.  The patient states that he felt at that time "he did not want to go on living".  The patient had texted a friend to say "goodbye" who had then called EMS who brought him to the [facility] emergency department emergently for care.  Upon arrival to the ICU from the emergency department the patient became agitated requiring Precedex.  He had multiple episodes of emesis with overt aspiration and was emergently intubated to protect his airway. Creatinine upon admission was 1.26 which had been increased to 3.38 in less than 24 hours.  With continued decline in renal function a temporary dialysis catheter was placed and the patient was initiated on hemodialysis. Pt moved to permacath placement on 6/18. Transitioned to Behavioral Medicine on 03/30/19.   Clinical Impression   Pt seen for OT evaluation this date. Pt pleasant and eager to participate. Pt previously evaluated while admitted inpatient at [facility] prior to transition to Behavioral Medicine. Prior to hospital admission, pt was independent. Pt lives by himself but reports new plan is to stay with his father for a period of time while he recovers his strength and balance prior to living by himself.  Currently pt demonstrates impairments in activity tolerance, balance, and knowledge of AE/DME requiring supervision assist for  LB ADL and close supervision for functional mobility using a RW. Pt endorses increased confidence and strength since  initial evaluation conducted prior to transition to [facility]. Pt educated in energy conservation strategies, AE/DME, pursed lip breathing, and falls prevention strategies to maximize safety and independence. Pt verbalized understanding. Pt would benefit from skilled OT to address noted impairments and functional limitations (see below for any additional details) in order to maximize safety and independence while minimizing falls risk and caregiver burden.  Upon hospital discharge, recommend pt discharge to father's home, per pt's reported plan, and with [facility] services to address impairments as well as identifying and implementing new ADL and IADL routines, stress mgt, ILS.    Follow Up Recommendations  Home health OT;Supervision - Intermittent    Equipment Recommendations  None recommended by OT    Recommendations for Other Services       Precautions / Restrictions Precautions Precautions: Fall;Other (comment) Precaution Comments: suicide precautions Restrictions Weight Bearing Restrictions: No      Mobility Bed Mobility Overal bed mobility: Modified Independent                Transfers Overall transfer level: Needs assistance Equipment used: Rolling walker (2 wheeled);None Transfers: Sit to/from Stand Sit to Stand: Supervision              Balance Overall balance assessment: Needs assistance Sitting-balance support: No upper extremity supported;Feet supported Sitting balance-Leahy Scale: Fair     Standing balance support: Bilateral upper extremity supported;During functional activity Standing balance-Leahy Scale: Fair                             ADL either performed or assessed with clinical judgement   ADL Overall  ADL's : Needs assistance/impaired Eating/Feeding: Independent   Grooming: Standing;Supervision/safety   Upper Body Bathing: Sitting;Standing;Supervision/ safety   Lower Body Bathing: Sit to/from stand;Supervison/ safety   Upper Body  Dressing : Standing;Sitting;Supervision/safety   Lower Body Dressing: Sit to/from stand;Supervision/safety   Toilet Transfer: Ambulation;Supervision/safety                   Vision Baseline Vision/History: No visual deficits Patient Visual Report: No change from baseline       Perception     Praxis      Pertinent Vitals/Pain       Hand Dominance     Extremity/Trunk Assessment Upper Extremity Assessment Upper Extremity Assessment: Overall WFL for tasks assessed(grossly at least 4+/5 bilaterally)   Lower Extremity Assessment Lower Extremity Assessment: Overall WFL for tasks assessed;Generalized weakness(grossly 4+/5 bilaterally, fatigues quickly)   Cervical / Trunk Assessment Cervical / Trunk Assessment: Normal   Communication Communication Communication: No difficulties   Cognition Arousal/Alertness: Awake/alert Behavior During Therapy: WFL for tasks assessed/performed Overall Cognitive Status: Within Functional Limits for tasks assessed                                     General Comments       Exercises Other Exercises Other Exercises: pt educated in energy conservation strategies including home/routines modifications, activity pacing, engaging in meaningful activities, work simplification, AE/DME, and falls prevention Other Exercises: pt educate in use of pursed lip breathing to support exertional recovery as well as as a stress mgt tool   Shoulder Instructions      Home Living Family/patient expects to be discharged to:: Private residence Living Arrangements: (pt lives alone typically, but verbalizes plan to move in with father for a period of time to recover) Available Help at Discharge: Family Type of Home: House(father's home) Home Access: Stairs to enter Technical brewer of Steps: "a coupleDesigner, industrial/product: Left;Right Home Layout: Two level;Full bath on main level;Able to live on main level with bedroom/bathroom      Bathroom Shower/Tub: Teacher, early years/pre: Standard     Home Equipment: Shower seat;Grab bars - tub/shower   Additional Comments: shower chair was his mother's before she passed away ~1YA      Prior Functioning/Environment Level of Independence: Independent        Comments: Pt independent before admission, 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        OT Problem List: Decreased activity tolerance;Impaired balance (sitting and/or standing);Decreased knowledge of use of DME or AE      OT Treatment/Interventions: Self-care/ADL training;Therapeutic exercise;Patient/family education;Balance training;Energy conservation;Therapeutic activities;DME and/or AE instruction;Cognitive remediation/compensation    OT Goals(Current goals can be found in the care plan section) Acute Rehab OT Goals Patient Stated Goal: to go home to father's house to regain strength and return to PLOF OT Goal Formulation: With patient Time For Goal Achievement: 04/14/19 Potential to Achieve Goals: Good ADL Goals Additional ADL Goal #1: Pt will perform daily morning routine ADL tasks at supervision level incorporating learned energy conservation strategies to maximize safety and independence. Additional ADL Goal #2: Pt will verbalize plan to implement at least 2 learned energy conservation strategies into his daily routine to maximize safety and independence. Additional ADL Goal #3: Pt will verbalize at least 2 learned stress mgt strategies to utilize when faced with challenging situation.  OT Frequency: Min 1X/week   Barriers to D/C:  Co-evaluation              AM-PAC OT "6 Clicks" Daily Activity     Outcome Measure Help from another person eating meals?: None Help from another person taking care of personal grooming?: None Help from another person toileting, which includes using toliet, bedpan, or urinal?: A Little Help from another person bathing (including  washing, rinsing, drying)?: A Little Help from another person to put on and taking off regular upper body clothing?: None Help from another person to put on and taking off regular lower body clothing?: A Little 6 Click Score: 21   End of Session    Activity Tolerance: Patient tolerated treatment well Patient left: Other (comment)(in transport chair with RN)  OT Visit Diagnosis: Other abnormalities of gait and mobility (R26.89)                Time: 2831-5176 OT Time Calculation (min): 31 min Charges:  OT General Charges $OT Visit: 1 Visit OT Evaluation $OT Eval Low Complexity: 1 Low OT Treatments $Self Care/Home Management : 8-22 mins $Therapeutic Activity: 8-22 mins  Jeni Salles, MPH, MS, OTR/L ascom [phone] 03/31/19, 3:00 PM

## 2019-03-31 NOTE — [facility] Suicide Risk Assessment (Signed)
[facility] INPATIENT:  Family/Significant Other Suicide Prevention Education  Suicide Prevention Education:  Patient Refusal for Family/Significant Other Suicide Prevention Education: The patient Glenn Rasmussen has refused to provide written consent for family/significant other to be provided Family/Significant Other Suicide Prevention Education during admission and/or prior to discharge.  Physician notified.  SPE completed with pt, as pt refused to consent to family contact. SPI pamphlet provided to pt and pt was encouraged to share information with support network, ask questions, and talk about any concerns relating to SPE. Pt denies access to guns/firearms and verbalized understanding of information provided. Mobile Crisis information also provided to pt.    [place] MSW LCSW 03/31/2019, 10:43 AM

## 2019-03-31 NOTE — Progress Notes (Signed)
Recreation Therapy Notes   Date: 03/31/2019  Time: 9:30 am  Location: Craft room  Behavioral response: Appropriate  Intervention Topic: Goals  Discussion/Intervention:  Group content on today was focused on goals. Patients described what goals are and how they define goals. Individuals expressed how they go about setting goals and reaching them. The group identified how important goals are and if they make short term goals to reach long term goals. Patients described how many goals they work on at a time and what affects them not reaching their goal. Individuals described how much time they put into planning and obtaining their goals. The group participated in the intervention "My Goal Board" and made personal goal boards to help them achieve their goal. Clinical Observations/Feedback:  Patient came to group and was defined goals as something you want to achieve. He identified short term goals as steeping stones to get to long term goals. Participant explained that goals keep him determined and accountable. Individual was social with peers and staff while participating in the intervention. Allysson Rinehimer LRT/CTRS         Zanylah Hardie 03/31/2019 11:04 AM

## 2019-03-31 NOTE — Progress Notes (Signed)
Recreation Therapy Notes  INPATIENT RECREATION THERAPY ASSESSMENT  Patient Details Name: Glenn Rasmussen MRN: [id number] DOB: Dec 29, 1990 Today's Date: 03/31/2019       Information Obtained From: Patient  Able to Participate in Assessment/Interview: Yes  Patient Presentation: Responsive  Reason for Admission (Per Patient): Active Symptoms, Suicidal Ideation, Suicide Attempt  Patient Stressors: Family, Work  Radiographer, therapeutic:      Leisure Interests (2+):  Nature - Insurance underwriter, Individual - Reading, Individual - TV(Cook)  Frequency of Recreation/Participation: Monthly  Awareness of Community Resources:  Yes  Community Resources:  [employer]  Current Use: Yes  If no, Barriers?:    Expressed Interest in [place] of Residence:  Insurance underwriter  Patient Main Form of Transportation: Musician  Patient Strengths:  Honest, Therapist, sports worthy, Dependable  Patient Identified Areas of Improvement:  My perception  Patient Goal for Hospitalization:  Get my walking back  Current SI (including self-harm):  No  Current HI:  No  Current AVH: No  Staff Intervention Plan: Group Attendance, Collaborate with Interdisciplinary Treatment Team  Consent to Intern Participation: N/A  Dmitry Macomber 03/31/2019, 12:00 PM

## 2019-03-31 NOTE — Plan of Care (Signed)
  Problem: Education: Goal: Knowledge of [facility] Education information/materials will improve Outcome: Progressing Goal: Emotional status will improve Outcome: Progressing Goal: Mental status will improve Outcome: Progressing Goal: Verbalization of understanding the information provided will improve Outcome: Progressing  D: Patient is awake, alert and oriented x4. Sitting in dayroom, MHT within reach. Denies SI, HI and AVH. Complains of anxiety. Affect and mood are sad. Interacting appropriately with patients and staff. Calm and cooperative. Had snack in dayroom.  A: Continue 1:1 for safety. R: Safety maintained.

## 2019-03-31 NOTE — Plan of Care (Signed)
D- Patient alert and oriented. Patient presents in a pleasant mood on assessment stating that he slept "decently" last night until he woke up in pain aroung 3am. Patient rated his depression a "2/10", stating "it's not as bad". Patient also endorsed anxiety, rating it a "4/10", reporting that "just today" is why he's anxious. Patient denies SI, HI, AVH, and pain at this time. Patient's biggest goal for today is "walking".  A- Scheduled medications administered to patient, per MD orders. Support and encouragement provided.  Routine safety checks conducted every 15 minutes.  Patient informed to notify staff with problems or concerns.  R- No adverse drug reactions noted. Patient contracts for safety at this time. Patient compliant with medications and treatment plan. Patient receptive, calm, and cooperative. Patient interacts well with others on the unit.  Patient remains safe at this time.   Problem: Education: Goal: Knowledge of [facility] Education information/materials will improve Outcome: Progressing Goal: Emotional status will improve Outcome: Progressing Goal: Mental status will improve Outcome: Progressing Goal: Verbalization of understanding the information provided will improve Outcome: Progressing   Problem: Health Behavior/Discharge Planning: Goal: Compliance with treatment plan for underlying cause of condition will improve Outcome: Progressing   Problem: Safety: Goal: Periods of time without injury will increase Outcome: Progressing   Problem: Coping: Goal: Coping ability will improve Outcome: Progressing Goal: Will verbalize feelings Outcome: Progressing   Problem: Self-Concept: Goal: Ability to identify factors that promote anxiety will improve Outcome: Progressing Goal: Level of anxiety will decrease Outcome: Progressing

## 2019-03-31 NOTE — H&P (Signed)
Psychiatric Admission Assessment Adult  Patient Identification: Glenn Rasmussen MRN:  [id number] Date of Evaluation:  03/31/2019 Chief Complaint:  Major depressive Disorder Principal Diagnosis: Major depressive disorder, recurrent severe without psychotic features ([place]) Diagnosis:  Principal Problem:   Major depressive disorder, recurrent severe without psychotic features ([place]) Active Problems:   Multiple drug overdose   Suicide attempt ([place])   HTN (hypertension)  History of Present Illness: Patient seen and chart reviewed.  Patient transferred from the medical service.  He presented to the hospital on 1 June with a overdose of multiple medications.  He tells me that the medicines he had overdosed on were just blood pressure medications.  He was admitted to the medical service and has had a extended course they are complicated by renal failure pneumonia and being ventilator dependent.  He is finally medically stable enough for transfer to the psychiatric ward.  Patient admits to having overdosed on medication.  He is limited in how much he can say to describe what his thinking was around it but has admitted that it was a suicide attempt.  Patient says that he feels anxious much of the time.  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  Patient otherwise minimizes most symptoms leading up to the overdose.  He denies any psychotic symptoms.  Denied that he was drinking or abusing any drugs.  Patient lives by himself.  He is employed.  He indicates to me that he does feel that he has a good social system around him.  Has some medical problems including dyslipidemia and high blood pressure but overall in good health.  Had been taking psychiatric medicine including clonazepam, Effexor and Vyvanse and trazodone prior to coming into the hospital which had been prescribed by his primary care doctor.   Was not seeing a therapist. Associated Signs/Symptoms: Depression Symptoms:  insomnia, suicidal attempt, anxiety, (Hypo) Manic Symptoms:  None reported Anxiety Symptoms:  Excessive Worry, Psychotic Symptoms:  Denies any PTSD Symptoms: Had a traumatic exposure:  Patient states in the time he worked as an Probation officer he observed multiple horrifying and upsetting situations which continue to bother him.  He is not able to be specific about whether anything recently triggered him in this regard. Total Time spent with patient: 1.5 hours  Past Psychiatric History: No previous psychiatric hospitalization.  Had reportedly seen a therapist years ago but had not been following up.  He is prescribed psychiatric medicine by his primary care doctor but otherwise had no previous psychiatric treatment.  No prior suicide attempts no history of violence no substance abuse.  Previous medicines include Cymbalta and Zoloft as well.  Is the patient at risk to self? Yes.    Has the patient been a risk to self in the past 6 months? Yes.    Has the patient been a risk to self within the distant past? No.  Is the patient a risk to others? No.  Has the patient been a risk to others in the past 6 months? No.  Has the patient been a risk to others within the distant past? No.   Prior Inpatient Therapy:   Prior Outpatient Therapy:    Alcohol Screening: 1. How often do you have a drink containing alcohol?: Never 2. How many drinks containing alcohol do you have on a typical day when you are drinking?: 1 or 2 3. How often  do you have six or more drinks on one occasion?: Never AUDIT-C Score: 0 4. How often during the last year have you found that you were not able to stop drinking once you had started?: Never 5. How often during the last year have you failed to do what was normally expected from you becasue of drinking?: Never 6. How often during the last year have you needed a first drink in the  morning to get yourself going after a heavy drinking session?: Never 7. How often during the last year have you had a feeling of guilt of remorse after drinking?: Never 8. How often during the last year have you been unable to remember what happened the night before because you had been drinking?: Never 9. Have you or someone else been injured as a result of your drinking?: No 10. Has a relative or friend or a doctor or another health worker been concerned about your drinking or suggested you cut down?: No Alcohol Use Disorder Identification Test Final Score (AUDIT): 0 Alcohol Brief Interventions/Follow-up: AUDIT Score <7 follow-up not indicated Substance Abuse History in the last 12 months:  No. Consequences of Substance Abuse: Negative Previous Psychotropic Medications: Yes  Psychological Evaluations: Yes  Past Medical History:  Past Medical History:  Diagnosis Date  . Hypertension   . IBS (irritable bowel syndrome)     Past Surgical History:  Procedure Laterality Date  . APPENDECTOMY    . CHOLECYSTECTOMY    . DIALYSIS/PERMA CATHETER INSERTION N/A 03/26/2019   Procedure: DIALYSIS/PERMA CATHETER INSERTION;  Surgeon: Algernon Huxley, MD;  Location: [place] [facility];  Service: Cardiovascular;  Laterality: N/A;  . TONSILLECTOMY     Family History:  Family History  Problem Relation Age of Onset  . Lung cancer Mother   . Lung cancer Father    Family Psychiatric  History: Patient describes his mother as having a drug abuse problem Tobacco Screening: Have you used any form of tobacco in the last 30 days? (Cigarettes, Smokeless Tobacco, Cigars, and/or Pipes): No Social History:  Social History   Substance and Sexual Activity  Alcohol Use No  . Frequency: Never     Social History   Substance and Sexual Activity  Drug Use No    Additional Social History: Marital status: Single Does patient have children?: No    Pain Medications: see PTA Prescriptions: see PTA Over the  Counter: see PTA                    Allergies:  No Known Allergies Lab Results: No results found for this or any previous visit (from the past 48 hour(s)).  Blood Alcohol level:  Lab Results  Component Value Date   ETH <10 83/38/2505    Metabolic Disorder Labs:  Lab Results  Component Value Date   HGBA1C 5.5 03/13/2019   MPG 111 03/13/2019   No results found for: PROLACTIN Lab Results  Component Value Date   CHOL 154 03/13/2019   TRIG 263 (H) 03/23/2019   HDL 32 (L) 03/13/2019   CHOLHDL 4.8 03/13/2019   VLDL 52 (H) 03/13/2019   LDLCALC 70 03/13/2019    Current Medications: Current Facility-Administered Medications  Medication Dose Route Frequency Provider Last Rate Last Dose  . acetaminophen (TYLENOL) tablet 650 mg  650 mg Oral Q6H PRN Patrecia Pour, NP   650 mg at 03/31/19 0347  . alum & mag hydroxide-simeth (MAALOX/MYLANTA) 200-200-20 MG/5ML suspension 30 mL  30 mL Oral Q4H PRN Lord,  Asa Saunas, NP      . aspirin EC tablet 325 mg  325 mg Oral Daily Patrecia Pour, NP   325 mg at 03/30/19 1948  . Chlorhexidine Gluconate Cloth 2 % PADS 6 each  6 each Topical Q0600 Patrecia Pour, NP   6 each at 03/31/19 0601  . clonazePAM (KLONOPIN) tablet 0.5 mg  0.5 mg Oral Daily PRN Patrecia Pour, NP   0.5 mg at 03/30/19 2138  . feeding supplement (ENSURE ENLIVE) (ENSURE ENLIVE) liquid 237 mL  237 mL Oral TID BM Orren Pietsch T, MD      . feeding supplement (NEPRO CARB STEADY) liquid 237 mL  237 mL Oral BID BM Patrecia Pour, NP      . heparin injection 5,000 Units  5,000 Units Subcutaneous Q8H Patrecia Pour, NP   5,000 Units at 03/31/19 1441  . ibuprofen (ADVIL) tablet 600 mg  600 mg Oral Q6H PRN Lamont Dowdy, NP   600 mg at 03/31/19 0452  . magnesium hydroxide (MILK OF MAGNESIA) suspension 30 mL  30 mL Oral Daily PRN Patrecia Pour, NP      . multivitamin with minerals tablet 1 tablet  1 tablet Oral Daily Terell Kincy, Madie Reno, MD   1 tablet at 03/31/19 1440  .  ondansetron (ZOFRAN-ODT) disintegrating tablet 4 mg  4 mg Oral Q6H PRN Lamont Dowdy, NP   4 mg at 03/31/19 0641  . pantoprazole (PROTONIX) EC tablet 40 mg  40 mg Oral QHS Patrecia Pour, NP   40 mg at 03/30/19 2117  . polyethylene glycol (MIRALAX / GLYCOLAX) packet 17 g  17 g Per Tube Daily PRN Reita Cliche, Asa Saunas, NP      . traZODone (DESYREL) tablet 200 mg  200 mg Oral QHS Rashida Ladouceur T, MD      . venlafaxine XR (EFFEXOR-XR) 24 hr capsule 150 mg  150 mg Oral Q breakfast Patrecia Pour, NP   150 mg at 03/31/19 0908   PTA Medications: Medications Prior to Admission  Medication Sig Dispense Refill Last Dose  . clonazePAM (KLONOPIN) 0.5 MG tablet Take 0.5-1 mg by mouth See admin instructions. Take 1 tablet (0.5mg ) by mouth every morning and 2 tablets (1mg ) by mouth every night     . dicyclomine (BENTYL) 20 MG tablet Take 20 mg by mouth every 6 (six) hours as needed for spasms.      . pantoprazole (PROTONIX) 40 MG tablet Take 1 tablet (40 mg total) by mouth at bedtime. 30 tablet 0   . QUEtiapine (SEROQUEL) 25 MG tablet Take 5 tablets (125 mg total) by mouth every evening. 30 tablet 0   . venlafaxine XR (EFFEXOR-XR) 150 MG 24 hr capsule Take 1 capsule (150 mg total) by mouth daily with breakfast. 30 capsule 0     Musculoskeletal: Strength & Muscle Tone: decreased Gait & Station: unsteady Patient leans: N/A  Psychiatric Specialty Exam: Physical Exam  Nursing note and vitals reviewed. Constitutional: He appears well-developed and well-nourished.  HENT:  Head: Normocephalic and atraumatic.  Eyes: Pupils are equal, round, and reactive to light. Conjunctivae are normal.  Neck: Normal range of motion.  Cardiovascular: Regular rhythm and normal heart sounds.  Respiratory: Effort normal. No respiratory distress.  GI: Soft.  Musculoskeletal: Normal range of motion.  Neurological: He is alert.  Skin: Skin is warm and dry.  Psychiatric: His speech is normal and behavior is normal.  Judgment and thought content normal. His mood appears anxious. Cognition  and memory are normal.    Review of Systems  Constitutional: Negative.   HENT: Negative.   Eyes: Negative.   Respiratory: Negative.   Cardiovascular: Negative.   Gastrointestinal: Negative.   Musculoskeletal: Negative.   Skin: Negative.   Neurological: Negative.   Psychiatric/Behavioral: Negative for depression, hallucinations, memory loss, substance abuse and suicidal ideas. The patient is nervous/anxious. The patient does not have insomnia.     Blood pressure 129/89, pulse 90, temperature 98.2 F (36.8 C), temperature source Oral, resp. rate 16, height 5\' 7"  (1.702 m), weight 78 kg, SpO2 98 %.Body mass index is 26.94 kg/m.  General Appearance: Casual  Eye Contact:  Fair  Speech:  Slow  Volume:  Decreased  Mood:  Anxious  Affect:  Congruent  Thought Process:  Goal Directed  Orientation:  Full (Time, Place, and Person)  Thought Content:  Logical  Suicidal Thoughts:  No  Homicidal Thoughts:  No  Memory:  Immediate;   Fair Recent;   Fair Remote;   Fair  Judgement:  Fair  Insight:  Shallow  Psychomotor Activity:  Decreased  Concentration:  Concentration: Fair  Recall:  [employer] of Knowledge:  Fair  Language:  Fair  Akathisia:  No  Handed:  Right  AIMS (if indicated):     Assets:  Desire for Improvement Housing Resilience  ADL's:  Impaired  Cognition:  WNL  Sleep:  Number of Hours: 5.75    Treatment Plan Summary: Daily contact with patient to assess and evaluate symptoms and progress in treatment, Medication management and Plan Patient status post an overdose suicide attempt that resulted in significant medical comorbidity.  Recovering from his medical condition but still with weakened musculature some difficulty walking.  Patient is currently minimizing symptoms of anxiety and depression.  He denies any suicidal thoughts.  It sounds like he probably has chronic anxiety problems and depressive  symptoms which she is minimizing.  We reviewed medication.  He has not been restarted on the Vyvanse.  He was on Effexor and Seroquel when he came downstairs.  After review with the patient there does not appear to be a specific indication for the Seroquel so I have discontinued that and replaced it with trazodone.  Continue the Effexor and the clonazepam.  Full treatment team evaluation.  Work on making sure he is medically stable and has appropriate outpatient treatment at the time of discharge.  Likely length of stay somewhere around 2 to 3 days  Observation Level/Precautions:  15 minute checks  Laboratory:  Chemistry Profile  Psychotherapy:    Medications:    Consultations:    Discharge Concerns:    Estimated LOS:  Other:     Physician Treatment Plan for Primary Diagnosis: Major depressive disorder, recurrent severe without psychotic features ([place]) Long Term Goal(s): Improvement in symptoms so as ready for discharge  Short Term Goals: Ability to verbalize feelings will improve and Ability to disclose and discuss suicidal ideas  Physician Treatment Plan for Secondary Diagnosis: Principal Problem:   Major depressive disorder, recurrent severe without psychotic features ([place]) Active Problems:   Multiple drug overdose   Suicide attempt ([place])   HTN (hypertension)  Long Term Goal(s): Improvement in symptoms so as ready for discharge  Short Term Goals: Ability to maintain clinical measurements within normal limits will improve and Compliance with prescribed medications will improve  I certify that inpatient services furnished can reasonably be expected to improve the patient's condition.    Alethia Berthold, MD 6/23/20202:49 PM

## 2019-03-31 NOTE — Progress Notes (Signed)
1900-2300 1:1 observation note D: Patient is awake, alert and oriented x4. Sitting in dayroom, MHT within reach. Denies SI, HI and AVH. Complains of anxiety. Affect and mood are sad. Interacting appropriately with patients and staff. Calm and cooperative. Had snack in dayroom.  A: Continue 1:1 for safety. R: Safety maintained. 0000-0400 D: Patient awakened at 0334 with pain to right porta cath site 8/10. No redness, swelling, drainage or heat noted around site. Porta cath is intact. Patient denies any other chest pain or shortness of breath, or dizziness. MHT within reach. A: Continue 1:1 for safety. Medicated for pain per prn order. R: Safety maintained. 0400-0700 D: Notified NP Grandville Silos about pain 8/10 in porta cath site. Ordered Ibuprofen 600 mg q6h prn Patient is awake and continuing to complain of pain, unrelieved by Tylenol given earlier. Denies SI, HI and AVH. MHT within reach. A: Continue 1:1 for safety. Ibuprofen given per prn order R: Safety maintained.

## 2019-03-31 NOTE — Evaluation (Signed)
Physical Therapy Evaluation Patient Details Name: Glenn Rasmussen MRN: [id number] DOB: 1991/03/25 Today's Date: 03/31/2019   History of Present Illness  66yoM with a past medical history of irritable bowel syndrome, hypertension and major depressive disorder who came to [facility] Mar 08, 2019 after a suicide attempt.  At the time, the patient admitted taking 38 Phenergan, 19 losartan, 42 amlodipine, 78 Klonopin pills.  The patient states that he felt at that time "he did not want to go on living".  The patient had texted a friend to say "goodbye" who had then called EMS who brought him to the [facility] emergency department emergently for care.  Upon arrival to the ICU from the emergency department the patient became agitated requiring Precedex.  He had multiple episodes of emesis with overt aspiration and was emergently intubated to protect his airway. Creatinine upon admission was 1.26 which had been increased to 3.38 in less than 24 hours.  With continued decline in renal function a temporary dialysis catheter was placed and the patient was initiated on hemodialysis. Pt moved to permacath placement on 6/18. Transitioned to Behavioral Medicine on 03/30/19.  Clinical Impression  Pt very eager to work with PT and motivated to do all he can.  Pt clearly with some frustration with unsteadiness during ambulation/standing balance tasks, however he was able to ambulate confidently and w/o stagger steps using walker, did have tandem ambulation/NBOS default that he struggled to address despite cuing.  He was less steady with decreased UE use and did have stagger stepping without UE use and need to slow gait. Pt scored 41/56 on the Berg Balance Scale indicating need for AD at this time.  Pt confident that he will be able to discharge to father's home and be safe until he regains strength, balance, confidence. Pt will need to use walker while walking independently at this time and will therefore  need one to d/c safely.      Follow Up Recommendations Home health PT    Equipment Recommendations  Rolling walker with 5" wheels    Recommendations for Other Services       Precautions / Restrictions Precautions Precautions: Fall;Other (comment) Precaution Comments: suicide precautions Restrictions Weight Bearing Restrictions: No      Mobility  Bed Mobility Overal bed mobility: Modified Independent Bed Mobility: Supine to Sit     Supine to sit: Modified independent (Device/Increase time)     General bed mobility comments: Pt easily gets up to side of bed w/o assist, maintains sitting balance  Transfers Overall transfer level: Modified independent Equipment used: Rolling walker (2 wheeled);None Transfers: Sit to/from Stand Sit to Stand: Min guard         General transfer comment: Pt with some initial unsteadiness but was able to maintain balance w/o AD and showed improved confidence with standing a few moments  Ambulation/Gait Ambulation/Gait assistance: Supervision Gait Distance (Feet): 250 Feet Assistive device: Rolling walker (2 wheeled);None(hallway rail)       General Gait Details: Pt with narrow BOS with frequent scissoring of steps (L more than R) and though he did not have a lot of overt sagger steps he did need to stop/slow at times to assure steadiness while walking w/o UE support.  ~ 100 ft with walker, ~100 ft with handrail and ~50 ft w/o UEs.    Stairs            Wheelchair Mobility    Modified Rankin (Stroke Patients Only)  Balance Overall balance assessment: Modified Independent Sitting-balance support: No upper extremity supported;Feet supported Sitting balance-Leahy Scale: Good     Standing balance support: Bilateral upper extremity supported;During functional activity Standing balance-Leahy Scale: Fair                   Standardized Balance Assessment Standardized Balance Assessment : Berg Balance Test Berg  Balance Test Sit to Stand: Able to stand  independently using hands Standing Unsupported: Able to stand safely 2 minutes Sitting with Back Unsupported but Feet Supported on Floor or Stool: Able to sit safely and securely 2 minutes Stand to Sit: Sits safely with minimal use of hands Transfers: Able to transfer safely, minor use of hands Standing Unsupported with Eyes Closed: Able to stand 10 seconds safely Standing Ubsupported with Feet Together: Able to place feet together independently and stand for 1 minute with supervision From Standing, Reach Forward with Outstretched Arm: Can reach forward >5 cm safely (2") From Standing Position, Pick up Object from Floor: Able to pick up shoe safely and easily From Standing Position, Turn to Look Behind Over each Shoulder: Turn sideways only but maintains balance Turn 360 Degrees: Able to turn 360 degrees safely one side only in 4 seconds or less Standing Unsupported, Alternately Place Feet on Step/Stool: Able to complete 4 steps without aid or supervision Standing Unsupported, One Foot in Front: Needs help to step but can hold 15 seconds Standing on One Leg: Tries to lift leg/unable to hold 3 seconds but remains standing independently Total Score: 41         Pertinent Vitals/Pain Pain Assessment: No/denies pain    Home Living Family/patient expects to be discharged to:: Private residence Living Arrangements: Alone Available Help at Discharge: (plans on staying with father in [place] initially) Type of Home: House(Pt lives in 2nd story apartment) Home Access: Stairs to enter Entrance Stairs-Rails: Can reach both Entrance Stairs-Number of Steps: 5 Home Layout: Two level;Full bath on main level;Able to live on main level with bedroom/bathroom Home Equipment: Shower seat;Grab bars - tub/shower Additional Comments: shower chair was his mother's before she passed away ~1YA    Prior Function Level of Independence: Independent          Comments: Pt independent before admission, 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, 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 independently active     Journalist, newspaper        Extremity/Trunk Assessment   Upper Extremity Assessment Upper Extremity Assessment: Overall WFL for tasks assessed    Lower Extremity Assessment Lower Extremity Assessment: Overall WFL for tasks assessed    Cervical / Trunk Assessment Cervical / Trunk Assessment: Normal  Communication   Communication: No difficulties  Cognition Arousal/Alertness: Awake/alert Behavior During Therapy: WFL for tasks assessed/performed Overall Cognitive Status: Within Functional Limits for tasks assessed                                 General Comments: Pt very pleasant t/o the session, eager to participate      General Comments      Exercises Other Exercises Other Exercises: pt educated in energy conservation strategies including home/routines modifications, activity pacing, engaging in meaningful activities, work simplification, AE/DME, and falls prevention Other Exercises: pt educate in use of pursed lip breathing to support exertional recovery as well as as a stress mgt tool   Assessment/Plan    PT Assessment Patient needs continued PT services  PT Problem  List Decreased strength;Decreased activity tolerance;Decreased balance;Decreased mobility;Decreased knowledge of precautions       PT Treatment Interventions DME instruction;Gait training;Therapeutic exercise;Functional mobility training;Therapeutic activities;Patient/family education;Balance training    PT Goals (Current goals can be found in the Care Plan section)  Acute Rehab PT Goals Patient Stated Goal: to go home to father's house to regain strength and return to PLOF PT Goal Formulation: With patient Time For Goal Achievement: 04/14/19 Potential to Achieve Goals: Good    Frequency Min 2X/week   Barriers to discharge        Co-evaluation                AM-PAC PT "6 Clicks" Mobility  Outcome Measure Help needed turning from your back to your side while in a flat bed without using bedrails?: None Help needed moving from lying on your back to sitting on the side of a flat bed without using bedrails?: None Help needed moving to and from a bed to a chair (including a wheelchair)?: None Help needed standing up from a chair using your arms (e.g., wheelchair or bedside chair)?: A Little Help needed to walk in hospital room?: A Little Help needed climbing 3-5 steps with a railing? : A Little 6 Click Score: 21    End of Session Equipment Utilized During Treatment: Gait belt Activity Tolerance: Patient tolerated treatment well Patient left: in bed Nurse Communication: Mobility status PT Visit Diagnosis: Unsteadiness on feet (R26.81);Other abnormalities of gait and mobility (R26.89);Muscle weakness (generalized) (M62.81)    Time: 8309-4076 PT Time Calculation (min) (ACUTE ONLY): 20 min   Charges:   PT Evaluation $PT Eval Low Complexity: 1 Low PT Treatments $Gait Training: 8-22 mins        Kreg Shropshire, DPT 03/31/2019, 5:58 PM

## 2019-03-31 NOTE — [facility] Group Notes (Signed)
  LCSW Group Therapy Note  03/31/2019 12:53 PM   Type of Therapy/Topic:  Group Therapy:  Feelings about Diagnosis  Participation Level:  Active   Description of Group:   This group will allow patients to explore their thoughts and feelings about diagnoses they have received. Patients will be guided to explore their level of understanding and acceptance of these diagnoses. Facilitator will encourage patients to process their thoughts and feelings about the reactions of others to their diagnosis and will guide patients in identifying ways to discuss their diagnosis with significant others in their lives. This group will be process-oriented, with patients participating in exploration of their own experiences, giving and receiving support, and processing challenge from other group members.   Therapeutic Goals: 1. Patient will demonstrate understanding of diagnosis as evidenced by identifying two or more symptoms of the disorder 2. Patient will be able to express two feelings regarding the diagnosis 3. Patient will demonstrate their ability to communicate their needs through discussion and/or role play  Summary of Patient Progress: Pt was appropriate and respectful in group. Pt was able to identify what his mental health diagnosis is and reported that a diagnosis is a medical term for what you have. Pt discussed certain diagnosis such as depression and anxiety being viewed more positively than other mental health diagnosis. Pt discussed society viewing medical diagnosis more positively then mental health diagnosis. Pt discussed having a supportive family and identified some coping skills he uses.   Therapeutic Modalities:   Cognitive Behavioral Therapy Brief Therapy Feelings Identification    Evalina Field, MSW, LCSW Clinical Social Work 03/31/2019 12:53 PM

## 2019-03-31 NOTE — Progress Notes (Signed)
1:1 Patient Hourly Rounding  1000: Patient is attending and actively participating in recreational therapy with his assigned safety sitter present.  1400: Patient is in a consult with the occupational therapist, with his assigned safety sitter present.  1800: Patient is in the dayroom, interacting with other members on the unit, with his assigned safety sitter present.

## 2019-03-31 NOTE — [facility] Counselor (Signed)
Adult Comprehensive Assessment  Patient ID: Glenn Rasmussen, male   DOB: 1990/12/31, 28 y.o.   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 Housing / Lack of housing: has his own home Substance abuse: Pt denies Bereavement / Loss: Both pts grandparents who raised him are deceased  Living/Environment/Situation:  Living Arrangements: Alone Living conditions (as described by patient or guardian): "good" Who else lives in the home?: pt lives alone How long has patient lived in current situation?: 3 years  Family History:  Marital status: Single Does patient have children?: No  Childhood History:  By whom was/is the patient raised?: Grandparents Additional childhood history information: Pt was raised by his maternal grandparents Description of patient's relationship with caregiver when they were a child: "great" Patient's description of current relationship with people who raised him/her: they are currently deceased Does patient have siblings?: Yes Number of Siblings: 1 Description of patient's current relationship with siblings: Pt has a younger brother who he reports a "very good" relationship with Did patient suffer any verbal/emotional/physical/sexual abuse as a child?: No(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) Did patient suffer from severe childhood neglect?: No Has patient ever been sexually abused/assaulted/raped as an adolescent or adult?: No Was the patient ever a victim of a crime or a  disaster?: No Witnessed domestic violence?: No Has patient been effected by domestic violence as an adult?: No  Education:  Highest grade of school patient has completed: BA in emergency medicine Currently a student?: No Learning disability?: No  Employment/Work Situation:   Employment situation: Employed Where is patient currently employed?: [employer] long has patient been employed?: 3 years Patient's job has been impacted by current illness: No What is the longest time patient has a held a job?: 8-9 years 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 Did You Receive Any Psychiatric Treatment/Services While in the [employer]?: No Are There Guns or Other Weapons in [place]?: No Are These Psychologist, educational?: (N/A)  Financial Resources:   Financial resources: Income from employment, Private insurance Does patient have a representative payee or guardian?: No  Alcohol/Substance Abuse:   What has been your use of drugs/alcohol within the last 12 months?: Pt denies Alcohol/Substance Abuse Treatment Hx: Denies past history Has alcohol/substance abuse ever caused legal problems?: No  Social Support System:   Pensions consultant Support System: Good Describe Community Support System: Family Type of faith/religion: Baptist How does patient's faith help to cope with current illness?: "Yes, I have a chaplin that comes to see me"  Leisure/Recreation:   Leisure and Hobbies: "fishing, camping, cooking, puzzles, music, and driving"  Strengths/Needs:   What is the patient's perception of their strengths?: "dependable and punctual" Patient states they can use these personal strengths during their treatment to contribute to their recovery: "I just want to learn coping skills" Patient states these barriers may affect/interfere with their treatment: pt denies Patient states these barriers may affect their return to the community: pt denies Other important information  patient would like considered in planning for their treatment: Pt is agreeable to [facility] referral  Discharge Plan:   Currently receiving community mental health services: No Patient states concerns and preferences for aftercare planning are: Pt is agreeable to [facility] referral Patient states they will know when they are safe and ready for discharge when: "I will mentally know when I'm ready, and people telling me how much better I got already and I am more confident now" Does patient have access to transportation?: Yes Does patient have financial barriers related to discharge medications?: No Will patient be returning to same living situation after discharge?: Yes  Summary/Recommendations:   Summary and Recommendations (to be completed by the evaluator): Pt is a 28 yo male living [place] [facility] Pt presents to the hospital seeking treatment for SI, depression, and medication stabilization. Pt has a diagnosis of MDD, recurrent, severe, without psychotic features. Pt is single with no children, employed of 3 years, has BCBS, and reports a good support system. Pt is agreeable to referral to [facility] in [place]. Recommendations for pt include: crisis stabilization, therapeutic milieu, encourage group attendance and participation, medication management for mood stabilization, and development for comprehensive mental wellness plan. CSW assessing for appropriate referrals.  Glenn Rasmussen MSW LCSW 03/31/2019 10:50 AM

## 2019-03-31 NOTE — [facility] Suicide Risk Assessment (Signed)
[facility] Admission Suicide Risk Assessment   Nursing information obtained from:  Patient Demographic factors:  Male, Adolescent or young adult, Caucasian Current Mental Status:  NA Loss Factors:  NA Historical Factors:  Family history of suicide Risk Reduction Factors:  Employed  Total Time spent with patient: 1 hour Principal Problem: Major depressive disorder, recurrent severe without psychotic features (Glenn Rasmussen) Diagnosis:  Principal Problem:   Major depressive disorder, recurrent severe without psychotic features (Glenn Rasmussen) Active Problems:   Multiple drug overdose   Suicide attempt ([place])   HTN (hypertension)  Subjective Data: Patient seen.  Patient denies any current suicidal thoughts.  States that he has a plan to go back to work and continue with his life.  Denies depressed mood.  Admits to some degree of chronic anxiety.  Denies psychotic symptoms.  Patient is currently cooperative with treatment and has been cooperative with medical treatment throughout his hospitalization  Continued Clinical Symptoms:  Alcohol Use Disorder Identification Test Final Score (AUDIT): 0 The "Alcohol Use Disorders Identification Test", Guidelines for Use in Primary Care, Second Edition.  World Pharmacologist [facility]). Score between 0-7:  no or low risk or alcohol related problems. Score between 8-15:  moderate risk of alcohol related problems. Score between 16-19:  high risk of alcohol related problems. Score 20 or above:  warrants further diagnostic evaluation for alcohol dependence and treatment.   CLINICAL FACTORS:   Depression:   Impulsivity   Musculoskeletal: Strength & Muscle Tone: within normal limits Gait & Station: unsteady Patient leans: N/A  Psychiatric Specialty Exam: Physical Exam  Nursing note and vitals reviewed. Constitutional: He appears well-developed and well-nourished.  HENT:  Head: Normocephalic and atraumatic.  Eyes: Pupils are equal, round, and reactive to light.  Conjunctivae are normal.  Neck: Normal range of motion.  Cardiovascular: Regular rhythm and normal heart sounds.  Respiratory: Effort normal.  GI: Soft.  Musculoskeletal: Normal range of motion.  Neurological: He is alert.  Skin: Skin is warm and dry.  Psychiatric: His behavior is normal. Judgment and thought content normal. His mood appears anxious. His speech is delayed. Cognition and memory are normal.    Review of Systems  Constitutional: Negative.   HENT: Negative.   Eyes: Negative.   Respiratory: Negative.   Cardiovascular: Negative.   Gastrointestinal: Negative.   Musculoskeletal: Negative.   Skin: Negative.   Neurological: Negative.   Psychiatric/Behavioral: Negative for suicidal ideas. The patient is nervous/anxious.     Blood pressure 129/89, pulse 90, temperature 98.2 F (36.8 C), temperature source Oral, resp. rate 16, height 5\' 7"  (1.702 m), weight 78 kg, SpO2 98 %.Body mass index is 26.94 kg/m.  General Appearance: Casual  Eye Contact:  Fair  Speech:  Slow  Volume:  Decreased  Mood:  Anxious  Affect:  Congruent  Thought Process:  Coherent  Orientation:  Full (Time, Place, and Person)  Thought Content:  Logical  Suicidal Thoughts:  No  Homicidal Thoughts:  No  Memory:  Immediate;   Fair Recent;   Fair Remote;   Fair  Judgement:  Fair  Insight:  Shallow  Psychomotor Activity:  Decreased  Concentration:  Concentration: Fair  Recall:  [employer] of Knowledge:  Fair  Language:  Fair  Akathisia:  No  Handed:  Right  AIMS (if indicated):     Assets:  Desire for Improvement  ADL's:  Impaired  Cognition:  WNL  Sleep:  Number of Hours: 5.75      COGNITIVE FEATURES THAT CONTRIBUTE TO RISK:  Loss  of executive function    SUICIDE RISK:   Mild:  Suicidal ideation of limited frequency, intensity, duration, and specificity.  There are no identifiable plans, no associated intent, mild dysphoria and related symptoms, good self-control (both objective and  subjective assessment), few other risk factors, and identifiable protective factors, including available and accessible social support.  PLAN OF CARE: Patient with recent suicide attempt.  3 weeks ago.  Currently denying suicidal ideation and has been cooperative with treatment.  Continue 15-minute checks.  Some medication adjustment.  Engage in individual and group assessment.  Work on arranging for appropriate outpatient care at discharge.  I certify that inpatient services furnished can reasonably be expected to improve the patient's condition.   Alethia Berthold, MD 03/31/2019, 3:06 PM

## 2019-03-31 NOTE — Progress Notes (Signed)
Initial Nutrition Assessment  DOCUMENTATION CODES:   Not applicable  INTERVENTION:   Ensure Enlive po TID, each supplement provides 350 kcal and 20 grams of protein  MVI daily   NUTRITION DIAGNOSIS:   Inadequate oral intake related to social / environmental circumstances as evidenced by meal completion < 50%.  GOAL:   Patient will meet greater than or equal to 90% of their needs  MONITOR:   PO intake, Supplement acceptance  REASON FOR ASSESSMENT:   Malnutrition Screening Tool    ASSESSMENT:   28 y.o. white male with hypertension, irritable bowel syndrome and depression. Pt with recent admit to [facility] for SI by overdose. Hospital course complicated by acute respiratory failure requiring mechanical ventilation, ileus and acute renal failure requiring renal replacement therapy and intermittent hemodialysis.  RD working remotely.  Pt with poor appetite and oral intake since admit; Pt eating <50% of meals in hospital currently. RD will add supplements and MVI to help pt meet his estimated needs. Pt did receive tube feeding while intubated and ventilated. Per chart, pt with 28lb(14%) weight loss over the past 6 months; this is significant. Pt's weights have remained stable since admit.   Medications reviewed and include: aspirin, heaprin, rena-vite, protonix  Labs reviewed:  Unable to complete Nutrition-Focused physical exam at this time.   Diet Order:   Diet Order            Diet regular Room service appropriate? Yes; Fluid consistency: Thin  Diet effective now             EDUCATION NEEDS:   No education needs have been identified at this time  Skin:  Skin Assessment: Reviewed RN Assessment  Last BM:  6/22  Height:   Ht Readings from Last 1 Encounters:  03/30/19 5\' 7"  (1.702 m)    Weight:   Wt Readings from Last 1 Encounters:  03/30/19 78 kg    Ideal Body Weight:  67 kg  BMI:  Body mass index is 26.94 kg/m.  Estimated Nutritional Needs:   Kcal:   1800-2100kcal/day  Protein:  86-102g/day  Fluid:  >2L/day  Koleen Distance MS, RD, LDN Pager #- [phone] Office#- [phone] After Hours Pager: [phone]

## 2019-04-01 NOTE — Plan of Care (Signed)
  Problem: Coping: Goal: Will verbalize feelings Outcome: Progressing  Patient verbalized feelings to staff and has some remorse over suicidal ideation.

## 2019-04-01 NOTE — Progress Notes (Signed)
7-8 am: Patient went down to eat breakfast, denies any pain 0/10. Morning medications given. MHT within reach.  9-10 am: Patient attended community group, appropriate with peers.  10-11am: Patient in dayroom with peers, watching television, No concerns.  12-1pm: Patient ate lunch, no complaints  1pm-2pm: patient attended social work group, appropriate in group.  2-3pm: patient  Martin Majestic outside, listened to music, no concerns.  3pm-4pm: Patient in dayroom with peers talking and watching television.  4-5 PM: Patient ate dinner, voiced no concerns, patient told nurse plan was to hopefully leave Friday.  During shift patient has remained safe, no falls, no obstructive behaviors. Mental Health tech within reach entire shift.

## 2019-04-01 NOTE — Plan of Care (Signed)
Patient is alert and oriented X 4, denies SI, HI and AVH. Patient gait is still unsteady, but patient is using a front wheel walker and a MHT is helping with safety. Patient is animated on the unit, telling jokes with other peers. Patient is eating well, sleeping well, patient states," Now that I am off Seroquel I can sleep much better, because the trazodone usually helps at night." Patient denies pain 0/10. Safety 1:1 to continue as well as 15 minute checks. Problem: Education: Goal: Knowledge of [facility] Education information/materials will improve Outcome: Progressing Goal: Emotional status will improve Outcome: Progressing Goal: Mental status will improve Outcome: Progressing Goal: Verbalization of understanding the information provided will improve Outcome: Progressing   Problem: Safety: Goal: Periods of time without injury will increase Outcome: Progressing   Problem: Coping: Goal: Coping ability will improve Outcome: Progressing Goal: Will verbalize feelings Outcome: Progressing

## 2019-04-01 NOTE — Progress Notes (Signed)
Patient remains 1:1 for safety with a safety sitter present. Patient is in the day room and denies SI/HI/AVH, pain, anxiety and depression with this Probation officer. Patient is pleasant and without complaint at this time. Patient remains safe on the unit.

## 2019-04-01 NOTE — Progress Notes (Signed)
Recreation Therapy Notes  Date: 04/01/2019  Time: 9:30 am  Location: Craft room  Behavioral response: Appropriate  Intervention Topic: Time Management  Discussion/Intervention:  Group content today was focused on time management. The group defined time management and identified healthy ways to manage time. Individuals expressed how much of the 24 hours they use in a day. Patients expressed how much time they use just for themselves personally. The group expressed how they have managed their time in the past. Individuals participated in the intervention "Managing Life" where they had a chance to see how much of the 24 hours they use and where it goes. Clinical Observations/Feedback:  Patient came to group and defined time management as how time is divided up to conquer a task.  Individual was social with peers and staff while participating in the intervention. Glenn Rasmussen LRT/CTRS         Glenn Rasmussen 04/01/2019 11:46 AM

## 2019-04-01 NOTE — Progress Notes (Signed)
Patient alert and oriented x 4, affect is flat but she brightens upon approach , no distress noted, on 1:1 for safety, he appears less anxious, interacting appropriately with peers and staff, compliant with medication regimen. Patient denies SI/HI/AVH, no distress noted, 15 minutes safety checks maintained will continue to monitor.

## 2019-04-01 NOTE — Progress Notes (Signed)
[facility] MD Progress Note  04/01/2019 4:10 PM Glenn Rasmussen  MRN:  [id number] Subjective: Follow-up for this gentleman with depression and a recent suicide attempt.  Patient seen chart reviewed.  Patient has been attending groups and working with physical therapy on improving his strength.  On interview today he says his mood feels better.  He denies feeling depressed.  Denies any suicidal thoughts at all.  Affectively he does look a bit anxious but not to an extreme degree and has been appropriately interactive and seems to have good insight.  No complaints of any side effects. Principal Problem: Major depressive disorder, recurrent severe without psychotic features ([place]) Diagnosis: Principal Problem:   Major depressive disorder, recurrent severe without psychotic features ([place]) Active Problems:   Multiple drug overdose   Suicide attempt ([place])   HTN (hypertension)  Total Time spent with patient: 30 minutes  Past Psychiatric History: Past history of what sounds like longstanding depression and anxiety  Past Medical History:  Past Medical History:  Diagnosis Date  . Hypertension   . IBS (irritable bowel syndrome)     Past Surgical History:  Procedure Laterality Date  . APPENDECTOMY    . CHOLECYSTECTOMY    . DIALYSIS/PERMA CATHETER INSERTION N/A 03/26/2019   Procedure: DIALYSIS/PERMA CATHETER INSERTION;  Surgeon: Algernon Huxley, MD;  Location: [place] [facility];  Service: Cardiovascular;  Laterality: N/A;  . TONSILLECTOMY     Family History:  Family History  Problem Relation Age of Onset  . Lung cancer Mother   . Lung cancer Father    Family Psychiatric  History: See previous Social History:  Social History   Substance and Sexual Activity  Alcohol Use No  . Frequency: Never     Social History   Substance and Sexual Activity  Drug Use No    Social History   Socioeconomic History  . Marital status: Single    Spouse name: Not on file  . Number of children: Not on file   . Years of education: Not on file  . Highest education level: Not on file  Occupational History  . Not on file  Social Needs  . Financial resource strain: Not on file  . Food insecurity    Worry: Not on file    Inability: Not on file  . Transportation needs    Medical: Not on file    Non-medical: Not on file  Tobacco Use  . Smoking status: Never Smoker  . Smokeless tobacco: Never Used  Substance and Sexual Activity  . Alcohol use: No    Frequency: Never  . Drug use: No  . Sexual activity: Not on file  Lifestyle  . Physical activity    Days per week: Not on file    Minutes per session: Not on file  . Stress: Not on file  Relationships  . Social Herbalist on phone: Not on file    Gets together: Not on file    Attends religious service: Not on file    Active member of club or organization: Not on file    Attends meetings of clubs or organizations: Not on file    Relationship status: Not on file  Other Topics Concern  . Not on file  Social History Narrative  . Not on file   Additional Social History:    Pain Medications: see PTA Prescriptions: see PTA Over the Counter: see PTA  Sleep: Fair  Appetite:  Fair  Current Medications: Current Facility-Administered Medications  Medication Dose Route Frequency Provider Last Rate Last Dose  . acetaminophen (TYLENOL) tablet 650 mg  650 mg Oral Q6H PRN Lord, Jamison Y, NP   650 mg at 04/01/19 0640  . alum & mag hydroxide-simeth (MAALOX/MYLANTA) 200-200-20 MG/5ML suspension 30 mL  30 mL Oral Q4H PRN Lord, Jamison Y, NP      . aspirin EC tablet 325 mg  325 mg Oral Daily Lord, Jamison Y, NP   325 mg at 03/31/19 2000  . Chlorhexidine Gluconate Cloth 2 % PADS 6 each  6 each Topical Q0600 Lord, Jamison Y, NP   6 each at 03/31/19 0601  . clonazePAM (KLONOPIN) tablet 0.5 mg  0.5 mg Oral Daily PRN Lord, Jamison Y, NP   0.5 mg at 04/01/19 0250  . feeding supplement (ENSURE ENLIVE) (ENSURE ENLIVE)  liquid 237 mL  237 mL Oral TID BM ,  T, MD      . feeding supplement (NEPRO CARB STEADY) liquid 237 mL  237 mL Oral BID BM Lord, Jamison Y, NP      . heparin injection 5,000 Units  5,000 Units Subcutaneous Q8H Lord, Jamison Y, NP   5,000 Units at 04/01/19 0629  . ibuprofen (ADVIL) tablet 600 mg  600 mg Oral Q6H PRN Thomspon, Jacqueline, NP   600 mg at 03/31/19 0452  . magnesium hydroxide (MILK OF MAGNESIA) suspension 30 mL  30 mL Oral Daily PRN Lord, Jamison Y, NP      . multivitamin with minerals tablet 1 tablet  1 tablet Oral Daily ,  T, MD   1 tablet at 04/01/19 0819  . ondansetron (ZOFRAN-ODT) disintegrating tablet 4 mg  4 mg Oral Q6H PRN Thomspon, Jacqueline, NP   4 mg at 04/01/19 1143  . pantoprazole (PROTONIX) EC tablet 40 mg  40 mg Oral QHS Lord, Jamison Y, NP   40 mg at 03/31/19 2149  . polyethylene glycol (MIRALAX / GLYCOLAX) packet 17 g  17 g Per Tube Daily PRN Lord, Jamison Y, NP      . traZODone (DESYREL) tablet 200 mg  200 mg Oral QHS ,  T, MD   200 mg at 03/31/19 2147  . venlafaxine XR (EFFEXOR-XR) 24 hr capsule 150 mg  150 mg Oral Q breakfast Lord, Jamison Y, NP   150 mg at 04/01/19 0819    Lab Results:  Results for orders placed or performed during the hospital encounter of 03/30/19 (from the past 48 hour(s))  Basic metabolic panel     Status: Abnormal   Collection Time: 03/31/19  2:55 PM  Result Value Ref Range   Sodium 137 135 - 145 mmol/L   Potassium 4.4 3.5 - 5.1 mmol/L   Chloride 106 98 - 111 mmol/L   CO2 19 (L) 22 - 32 mmol/L   Glucose, Bld 93 70 - 99 mg/dL   BUN 42 (H) 6 - 20 mg/dL   Creatinine, Ser 2.17 (H) 0.61 - 1.24 mg/dL   Calcium 9.6 8.9 - 10.3 mg/dL   GFR calc non Af Amer 39 (L) >60 mL/min   GFR calc Af Amer 46 (L) >60 mL/min   Anion gap 12 5 - 15    Comment: Performed at [facility], [address] [place], [place] [address]    Blood Alcohol level:  Lab Results  Component Value Date   ETH <10 03/08/2019     Metabolic Disorder Labs: Lab Results  Component   Value Date   HGBA1C 5.5 03/13/2019   MPG 111 03/13/2019   No results found for: PROLACTIN Lab Results  Component Value Date   CHOL 154 03/13/2019   TRIG 263 (H) 03/23/2019   HDL 32 (L) 03/13/2019   CHOLHDL 4.8 03/13/2019   VLDL 52 (H) 03/13/2019   LDLCALC 70 03/13/2019    Physical Findings: AIMS: Facial and Oral Movements Muscles of Facial Expression: None, normal Lips and Perioral Area: None, normal Jaw: None, normal Tongue: None, normal,Extremity Movements Upper (arms, wrists, hands, fingers): None, normal Lower (legs, knees, ankles, toes): None, normal, Trunk Movements Neck, shoulders, hips: None, normal, Overall Severity Severity of abnormal movements (highest score from questions above): None, normal Incapacitation due to abnormal movements: None, normal Patient's awareness of abnormal movements (rate only patient's report): No Awareness, Dental Status Current problems with teeth and/or dentures?: No Does patient usually wear dentures?: No  CIWA:  CIWA-Ar Total: 0 COWS:  COWS Total Score: 1  Musculoskeletal: Strength & Muscle Tone: within normal limits Gait & Station: normal Patient leans: N/A  Psychiatric Specialty Exam: Physical Exam  Nursing note and vitals reviewed. Constitutional: He appears well-developed and well-nourished.  HENT:  Head: Normocephalic and atraumatic.  Eyes: Pupils are equal, round, and reactive to light. Conjunctivae are normal.  Neck: Normal range of motion.  Cardiovascular: Regular rhythm and normal heart sounds.  Respiratory: Effort normal. No respiratory distress.  GI: Soft.  Musculoskeletal: Normal range of motion.  Neurological: He is alert.  Skin: Skin is warm and dry.  Psychiatric: He has a normal mood and affect. His behavior is normal. Judgment and thought content normal.    Review of Systems  Constitutional: Negative.   HENT: Negative.   Eyes: Negative.    Respiratory: Negative.   Cardiovascular: Negative.   Gastrointestinal: Negative.   Musculoskeletal: Negative.   Skin: Negative.   Neurological: Negative.   Psychiatric/Behavioral: Negative for depression, hallucinations, memory loss, substance abuse and suicidal ideas. The patient is not nervous/anxious and does not have insomnia.     Blood pressure (!) 153/97, pulse 100, temperature 98.3 F (36.8 C), temperature source Oral, resp. rate 16, height 5' 7" (1.702 m), weight 78 kg, SpO2 100 %.Body mass index is 26.94 kg/m.  General Appearance: Casual  Eye Contact:  Good  Speech:  Clear and Coherent  Volume:  Normal  Mood:  Euthymic  Affect:  Congruent  Thought Process:  Goal Directed  Orientation:  Full (Time, Place, and Person)  Thought Content:  Logical  Suicidal Thoughts:  No  Homicidal Thoughts:  No  Memory:  Immediate;   Fair Recent;   Fair Remote;   Fair  Judgement:  Fair  Insight:  Fair  Psychomotor Activity:  Normal  Concentration:  Concentration: Fair  Recall:  [employer] of Knowledge:  Fair  Language:  Fair  Akathisia:  No  Handed:  Right  AIMS (if indicated):     Assets:  Desire for Improvement Housing  ADL's:  Intact  Cognition:  WNL  Sleep:  Number of Hours: 6     Treatment Plan Summary: Plan Patient seen and chart reviewed.  Patient seems to be stable and improving.  Denies suicidal ideation.  Appropriate in his insight and behavior.  Tentatively planning for likely discharge by Friday.  No change to medication today.  Patient met with treatment team.  Alethia Berthold, MD 04/01/2019, 4:10 PM

## 2019-04-01 NOTE — B[facility] Group Notes (Signed)
Emotional Regulation 04/01/2019 1PM  Type of Therapy/Topic:  Group Therapy:  Emotion Regulation  Participation Level:  Active   Description of Group:   The purpose of this group is to assist patients in learning to regulate negative emotions and experience positive emotions. Patients will be guided to discuss ways in which they have been vulnerable to their negative emotions. These vulnerabilities will be juxtaposed with experiences of positive emotions or situations, and patients will be challenged to use positive emotions to combat negative ones. Special emphasis will be placed on coping with negative emotions in conflict situations, and patients will process healthy conflict resolution skills.  Therapeutic Goals: 1. Patient will identify two positive emotions or experiences to reflect on in order to balance out negative emotions 2. Patient will label two or more emotions that they find the most difficult to experience 3. Patient will demonstrate positive conflict resolution skills through discussion and/or role plays  Summary of Patient Progress:  Actively and appropriately engaged in the group. Patient was able to provide support and validation to other group members. Patient identified his family as a trigger  that causes him to respond in a negative manner. He identified finding healthy supports and learning to say no as ways to improve emotion regulation.   Therapeutic Modalities:   Cognitive Behavioral Therapy Feelings Identification Dialectical Behavioral Therapy   Yvette Rack, LCSW 04/01/2019 1:47 PM

## 2019-04-01 NOTE — Tx Team (Addendum)
Interdisciplinary Treatment and Diagnostic Plan Update  04/01/2019 Time of Session: [place] MRN: [id number]  Principal Diagnosis: Major depressive disorder, recurrent severe without psychotic features [facility])  Secondary Diagnoses: Principal Problem:   Major depressive disorder, recurrent severe without psychotic features ([place]) Active Problems:   Multiple drug overdose   Suicide attempt ([place])   HTN (hypertension)   Current Medications:  Current Facility-Administered Medications  Medication Dose Route Frequency Provider Last Rate Last Dose  . acetaminophen (TYLENOL) tablet 650 mg  650 mg Oral Q6H PRN Patrecia Pour, NP   650 mg at 04/01/19 0640  . alum & mag hydroxide-simeth (MAALOX/MYLANTA) 200-200-20 MG/5ML suspension 30 mL  30 mL Oral Q4H PRN Patrecia Pour, NP      . aspirin EC tablet 325 mg  325 mg Oral Daily Patrecia Pour, NP   325 mg at 03/31/19 2000  . Chlorhexidine Gluconate Cloth 2 % PADS 6 each  6 each Topical Q0600 Patrecia Pour, NP   6 each at 03/31/19 0601  . clonazePAM (KLONOPIN) tablet 0.5 mg  0.5 mg Oral Daily PRN Patrecia Pour, NP   0.5 mg at 04/01/19 0250  . feeding supplement (ENSURE ENLIVE) (ENSURE ENLIVE) liquid 237 mL  237 mL Oral TID BM Clapacs, John T, MD      . feeding supplement (NEPRO CARB STEADY) liquid 237 mL  237 mL Oral BID BM Patrecia Pour, NP      . heparin injection 5,000 Units  5,000 Units Subcutaneous Q8H Patrecia Pour, NP   5,000 Units at 04/01/19 [phone]  . ibuprofen (ADVIL) tablet 600 mg  600 mg Oral Q6H PRN Lamont Dowdy, NP   600 mg at 03/31/19 0452  . magnesium hydroxide (MILK OF MAGNESIA) suspension 30 mL  30 mL Oral Daily PRN Patrecia Pour, NP      . multivitamin with minerals tablet 1 tablet  1 tablet Oral Daily Clapacs, Madie Reno, MD   1 tablet at 04/01/19 [phone]  . ondansetron (ZOFRAN-ODT) disintegrating tablet 4 mg  4 mg Oral Q6H PRN Lamont Dowdy, NP   4 mg at 04/01/19 1143  . pantoprazole (PROTONIX) EC  tablet 40 mg  40 mg Oral QHS Patrecia Pour, NP   40 mg at 03/31/19 2149  . polyethylene glycol (MIRALAX / GLYCOLAX) packet 17 g  17 g Per Tube Daily PRN Patrecia Pour, NP      . traZODone (DESYREL) tablet 200 mg  200 mg Oral QHS Clapacs, Madie Reno, MD   200 mg at 03/31/19 2147  . venlafaxine XR (EFFEXOR-XR) 24 hr capsule 150 mg  150 mg Oral Q breakfast Patrecia Pour, NP   150 mg at 04/01/19 6644   PTA Medications: Medications Prior to Admission  Medication Sig Dispense Refill Last Dose  . clonazePAM (KLONOPIN) 0.5 MG tablet Take 0.5-1 mg by mouth See admin instructions. Take 1 tablet (0.5mg ) by mouth every morning and 2 tablets (1mg ) by mouth every night     . dicyclomine (BENTYL) 20 MG tablet Take 20 mg by mouth every 6 (six) hours as needed for spasms.      . pantoprazole (PROTONIX) 40 MG tablet Take 1 tablet (40 mg total) by mouth at bedtime. 30 tablet 0   . QUEtiapine (SEROQUEL) 25 MG tablet Take 5 tablets (125 mg total) by mouth every evening. 30 tablet 0   . venlafaxine XR (EFFEXOR-XR) 150 MG 24 hr capsule Take 1 capsule (150 mg total) by  mouth daily with breakfast. 30 capsule 0     Patient Stressors: Marital or family conflict Traumatic event  Patient Strengths: General fund of knowledge Motivation for treatment/growth Supportive family/friends Work skills  Treatment Modalities: Medication Management, Group therapy, Case management,  1 to 1 session with clinician, Psychoeducation, Recreational therapy.   Physician Treatment Plan for Primary Diagnosis: Major depressive disorder, recurrent severe without psychotic features ([place]) Long Term Goal(s): Improvement in symptoms so as ready for discharge Improvement in symptoms so as ready for discharge   Short Term Goals: Ability to verbalize feelings will improve Ability to disclose and discuss suicidal ideas Ability to maintain clinical measurements within normal limits will improve Compliance with prescribed medications will  improve  Medication Management: Evaluate patient's response, side effects, and tolerance of medication regimen.  Therapeutic Interventions: 1 to 1 sessions, Unit Group sessions and Medication administration.  Evaluation of Outcomes: Progressing  Physician Treatment Plan for Secondary Diagnosis: Principal Problem:   Major depressive disorder, recurrent severe without psychotic features ([place]) Active Problems:   Multiple drug overdose   Suicide attempt ([place])   HTN (hypertension)  Long Term Goal(s): Improvement in symptoms so as ready for discharge Improvement in symptoms so as ready for discharge   Short Term Goals: Ability to verbalize feelings will improve Ability to disclose and discuss suicidal ideas Ability to maintain clinical measurements within normal limits will improve Compliance with prescribed medications will improve     Medication Management: Evaluate patient's response, side effects, and tolerance of medication regimen.  Therapeutic Interventions: 1 to 1 sessions, Unit Group sessions and Medication administration.  Evaluation of Outcomes: Progressing   RN Treatment Plan for Primary Diagnosis: Major depressive disorder, recurrent severe without psychotic features ([place]) Long Term Goal(s): Knowledge of disease and therapeutic regimen to maintain health will improve  Short Term Goals: Ability to remain free from injury will improve, Ability to demonstrate self-control, Ability to verbalize feelings will improve and Ability to identify and develop effective coping behaviors will improve  Medication Management: RN will administer medications as ordered by provider, will assess and evaluate patient's response and provide education to patient for prescribed medication. RN will report any adverse and/or side effects to prescribing provider.  Therapeutic Interventions: 1 on 1 counseling sessions, Psychoeducation, Medication administration, Evaluate responses to treatment,  Monitor vital signs and CBGs as ordered, Perform/monitor CIWA, COWS, AIMS and Fall Risk screenings as ordered, Perform wound care treatments as ordered.  Evaluation of Outcomes: Progressing   LCSW Treatment Plan for Primary Diagnosis: Major depressive disorder, recurrent severe without psychotic features ([place]) Long Term Goal(s): Safe transition to appropriate next level of care at discharge, Engage patient in therapeutic group addressing interpersonal concerns.  Short Term Goals: Engage patient in aftercare planning with referrals and resources, Increase emotional regulation and Increase skills for wellness and recovery  Therapeutic Interventions: Assess for all discharge needs, 1 to 1 time with Social worker, Explore available resources and support systems, Assess for adequacy in community support network, Educate family and significant other(s) on suicide prevention, Complete Psychosocial Assessment, Interpersonal group therapy.  Evaluation of Outcomes: Progressing   Progress in Treatment: Attending groups: Yes. Participating in groups: Yes. Taking medication as prescribed: Yes. Toleration medication: Yes. Family/Significant other contact made: No, will contact:  pt declined collateral contact Patient understands diagnosis: Yes. Discussing patient identified problems/goals with staff: Yes. Medical problems stabilized or resolved: Yes. Denies suicidal/homicidal ideation: Yes. Issues/concerns per patient self-inventory: No. Other: N/A  New problem(s) identified: No, Describe:  none  New  Short Term/Long Term Goal(s): medication management for mood stabilization; development of comprehensive mental wellness/sobriety plan.   Patient Goals:  "To continue to keep getting stronger and learn more coping mechanisms"  Discharge Plan or Barriers: SPE pamphlet, Mobile Crisis information, and AA/NA information provided to patient for additional community support and resources. Pt has an  appointment with [facility] on 04/14/2019 at [place].  Reason for Continuation of Hospitalization: Medication stabilization  Estimated Length of Stay: Friday 04/03/19   Recreational Therapy: Patient Stressors: Work, Family  Patient Goal: Patient will engage in groups without prompting or encouragement from LRT x3 group sessions within 5 recreation therapy group sessions  Attendees: Patient: Glenn Rasmussen 04/01/2019 3:02 PM  Physician: Dr Weber Cooks MD 04/01/2019 3:02 PM  Nursing:  04/01/2019 3:02 PM  RN Care Manager: 04/01/2019 3:02 PM  Social Worker: Minette Brine Moton LCSW 04/01/2019 3:02 PM  Recreational Therapist: Roanna Epley CTRS LRT 04/01/2019 3:02 PM  Other: Sanjuana Kava LCSW 04/01/2019 3:02 PM  Other:  04/01/2019 3:02 PM  Other: 04/01/2019 3:02 PM    Scribe for Treatment Team: Mariann Laster Moton, LCSW 04/01/2019 3:02 PM

## 2019-04-02 LAB — BASIC METABOLIC PANEL
Anion gap: 12 (ref 5–15)
BUN: 35 mg/dL — ABNORMAL HIGH (ref 6–20)
CO2: 20 mmol/L — ABNORMAL LOW (ref 22–32)
Calcium: 9.7 mg/dL (ref 8.9–10.3)
Chloride: 106 mmol/L (ref 98–111)
Creatinine, Ser: 1.98 mg/dL — ABNORMAL HIGH (ref 0.61–1.24)
GFR calc Af Amer: 51 mL/min — ABNORMAL LOW (ref >60)
GFR calc non Af Amer: 44 mL/min — ABNORMAL LOW (ref >60)
Glucose, Bld: 107 mg/dL — ABNORMAL HIGH (ref 70–99)
Potassium: 4.4 mmol/L (ref 3.5–5.1)
Sodium: 138 mmol/L (ref 135–145)

## 2019-04-02 MED ORDER — ASPIRIN 325 MG PO TBEC: 325.0000 mg | DELAYED_RELEASE_TABLET | Freq: Every day | ORAL | 0 refills | Status: DC

## 2019-04-02 MED ORDER — VENLAFAXINE HCL ER 150 MG PO CP24: 150.0000 mg | ORAL_CAPSULE | Freq: Every day | ORAL | 0 refills | Status: DC

## 2019-04-02 MED ORDER — POLYETHYLENE GLYCOL 3350 17 G PO PACK: 17.0000 g | PACK | Freq: Every day | ORAL | 0 refills | Status: DC | PRN

## 2019-04-02 MED ORDER — PANTOPRAZOLE SODIUM 40 MG PO TBEC: 40.0000 mg | DELAYED_RELEASE_TABLET | Freq: Every day | ORAL | 0 refills | Status: DC

## 2019-04-02 MED ORDER — TRAZODONE HCL 100 MG PO TABS: 200.0000 mg | ORAL_TABLET | Freq: Every day | ORAL | 0 refills | Status: DC

## 2019-04-02 NOTE — Progress Notes (Signed)
[facility] MD Progress Note  04/02/2019 4:28 PM Glenn Rasmussen  MRN:  [id number] Subjective: Follow-up patient with depression recovering from suicide attempt.  Reports that mood continues to be good.  Denies suicidal thoughts.  He is regaining his strength more by walking regularly.  No reports of any current cognitive impairment.  Looking forward to discharge. Principal Problem: Major depressive disorder, recurrent severe without psychotic features ([place]) Diagnosis: Principal Problem:   Major depressive disorder, recurrent severe without psychotic features ([place]) Active Problems:   Multiple drug overdose   Suicide attempt ([place])   HTN (hypertension)  Total Time spent with patient: 30 minutes  Past Psychiatric History: Recent suicide attempt now resulting in a prolonged hospitalization.  Past Medical History:  Past Medical History:  Diagnosis Date  . Hypertension   . IBS (irritable bowel syndrome)     Past Surgical History:  Procedure Laterality Date  . APPENDECTOMY    . CHOLECYSTECTOMY    . DIALYSIS/PERMA CATHETER INSERTION N/A 03/26/2019   Procedure: DIALYSIS/PERMA CATHETER INSERTION;  Surgeon: Algernon Huxley, MD;  Location: [facility];  Service: Cardiovascular;  Laterality: N/A;  . TONSILLECTOMY     Family History:  Family History  Problem Relation Age of Onset  . Lung cancer Mother   . Lung cancer Father    Family Psychiatric  History: See previous Social History:  Social History   Substance and Sexual Activity  Alcohol Use No  . Frequency: Never     Social History   Substance and Sexual Activity  Drug Use No    Social History   Socioeconomic History  . Marital status: Single    Spouse name: Not on file  . Number of children: Not on file  . Years of education: Not on file  . Highest education level: Not on file  Occupational History  . Not on file  Social Needs  . Financial resource strain: Not on file  . Food insecurity    Worry: Not on file   Inability: Not on file  . Transportation needs    Medical: Not on file    Non-medical: Not on file  Tobacco Use  . Smoking status: Never Smoker  . Smokeless tobacco: Never Used  Substance and Sexual Activity  . Alcohol use: No    Frequency: Never  . Drug use: No  . Sexual activity: Not on file  Lifestyle  . Physical activity    Days per week: Not on file    Minutes per session: Not on file  . Stress: Not on file  Relationships  . Social Herbalist on phone: Not on file    Gets together: Not on file    Attends religious service: Not on file    Active member of club or organization: Not on file    Attends meetings of clubs or organizations: Not on file    Relationship status: Not on file  Other Topics Concern  . Not on file  Social History Narrative  . Not on file   Additional Social History:    Pain Medications: see PTA Prescriptions: see PTA Over the Counter: see PTA                    Sleep: Fair  Appetite:  Fair  Current Medications: Current Facility-Administered Medications  Medication Dose Route Frequency Provider Last Rate Last Dose  . acetaminophen (TYLENOL) tablet 650 mg  650 mg Oral Q6H PRN Patrecia Pour, NP  650 mg at 04/02/19 0612  . alum & mag hydroxide-simeth (MAALOX/MYLANTA) 200-200-20 MG/5ML suspension 30 mL  30 mL Oral Q4H PRN Patrecia Pour, NP      . aspirin EC tablet 325 mg  325 mg Oral Daily Patrecia Pour, NP   325 mg at 04/02/19 1128  . Chlorhexidine Gluconate Cloth 2 % PADS 6 each  6 each Topical Q0600 Patrecia Pour, NP   6 each at 03/31/19 0601  . clonazePAM (KLONOPIN) tablet 0.5 mg  0.5 mg Oral Daily PRN Patrecia Pour, NP   0.5 mg at 04/01/19 2155  . feeding supplement (ENSURE ENLIVE) (ENSURE ENLIVE) liquid 237 mL  237 mL Oral TID BM Kylan Liberati T, MD   237 mL at 04/01/19 1413  . feeding supplement (NEPRO CARB STEADY) liquid 237 mL  237 mL Oral BID BM Patrecia Pour, NP      . ibuprofen (ADVIL) tablet 600 mg  600  mg Oral Q6H PRN Lamont Dowdy, NP   600 mg at 03/31/19 0452  . magnesium hydroxide (MILK OF MAGNESIA) suspension 30 mL  30 mL Oral Daily PRN Patrecia Pour, NP      . multivitamin with minerals tablet 1 tablet  1 tablet Oral Daily Kyan Yurkovich, Madie Reno, MD   1 tablet at 04/02/19 [phone]  . ondansetron (ZOFRAN-ODT) disintegrating tablet 4 mg  4 mg Oral Q6H PRN Lamont Dowdy, NP   4 mg at 04/01/19 1143  . pantoprazole (PROTONIX) EC tablet 40 mg  40 mg Oral QHS Patrecia Pour, NP   40 mg at 04/01/19 2155  . polyethylene glycol (MIRALAX / GLYCOLAX) packet 17 g  17 g Per Tube Daily PRN Patrecia Pour, NP      . traZODone (DESYREL) tablet 200 mg  200 mg Oral QHS Brenden Rudman, Madie Reno, MD   200 mg at 04/01/19 2155  . venlafaxine XR (EFFEXOR-XR) 24 hr capsule 150 mg  150 mg Oral Q breakfast Patrecia Pour, NP   150 mg at 04/02/19 4010    Lab Results:  Results for orders placed or performed during the hospital encounter of 03/30/19 (from the past 48 hour(s))  Basic metabolic panel     Status: Abnormal   Collection Time: 04/02/19  7:19 AM  Result Value Ref Range   Sodium 138 135 - 145 mmol/L   Potassium 4.4 3.5 - 5.1 mmol/L   Chloride 106 98 - 111 mmol/L   CO2 20 (L) 22 - 32 mmol/L   Glucose, Bld 107 (H) 70 - 99 mg/dL   BUN 35 (H) 6 - 20 mg/dL   Creatinine, Ser 1.98 (H) 0.61 - 1.24 mg/dL   Calcium 9.7 8.9 - 10.3 mg/dL   GFR calc non Af Amer 44 (L) >60 mL/min   GFR calc Af Amer 51 (L) >60 mL/min   Anion gap 12 5 - 15    Comment: Performed at [facility], [address] [place], [place] [address]    Blood Alcohol level:  Lab Results  Component Value Date   [facility] <10 66/44/0347    Metabolic Disorder Labs: Lab Results  Component Value Date   HGBA1C 5.5 03/13/2019   MPG 111 03/13/2019   No results found for: PROLACTIN Lab Results  Component Value Date   CHOL 154 03/13/2019   TRIG 263 (H) 03/23/2019   HDL 32 (L) 03/13/2019   CHOLHDL 4.8 03/13/2019   VLDL 52 (H) 03/13/2019    LDLCALC 70 03/13/2019  Physical Findings: AIMS: Facial and Oral Movements Muscles of Facial Expression: None, normal Lips and Perioral Area: None, normal Jaw: None, normal Tongue: None, normal,Extremity Movements Upper (arms, wrists, hands, fingers): None, normal Lower (legs, knees, ankles, toes): None, normal, Trunk Movements Neck, shoulders, hips: None, normal, Overall Severity Severity of abnormal movements (highest score from questions above): None, normal Incapacitation due to abnormal movements: None, normal Patient's awareness of abnormal movements (rate only patient's report): No Awareness, Dental Status Current problems with teeth and/or dentures?: No Does patient usually wear dentures?: No  CIWA:  CIWA-Ar Total: 0 COWS:  COWS Total Score: 1  Musculoskeletal: Strength & Muscle Tone: within normal limits Gait & Station: normal Patient leans: N/A  Psychiatric Specialty Exam: Physical Exam  Nursing note and vitals reviewed. Constitutional: He appears well-developed and well-nourished.  HENT:  Head: Normocephalic and atraumatic.  Eyes: Pupils are equal, round, and reactive to light. Conjunctivae are normal.  Neck: Normal range of motion.  Cardiovascular: Regular rhythm and normal heart sounds.  Respiratory: Effort normal.  GI: Soft.  Musculoskeletal: Normal range of motion.  Neurological: He is alert.  Skin: Skin is warm and dry.  Psychiatric: His speech is normal and behavior is normal. Judgment and thought content normal. His mood appears anxious. Cognition and memory are normal.    Review of Systems  Constitutional: Negative.   HENT: Negative.   Eyes: Negative.   Respiratory: Negative.   Cardiovascular: Negative.   Gastrointestinal: Negative.   Musculoskeletal: Negative.   Skin: Negative.   Neurological: Negative.   Psychiatric/Behavioral: Negative.     Blood pressure (!) 142/89, pulse 96, temperature 98.6 F (37 C), temperature source Oral, resp.  rate 17, height 5\' 7"  (1.702 m), weight 78 kg, SpO2 100 %.Body mass index is 26.94 kg/m.  General Appearance: Casual  Eye Contact:  Good  Speech:  Normal Rate  Volume:  Normal  Mood:  Euthymic  Affect:  Congruent  Thought Process:  Goal Directed  Orientation:  Full (Time, Place, and Person)  Thought Content:  Logical  Suicidal Thoughts:  No  Homicidal Thoughts:  No  Memory:  Immediate;   Fair Recent;   Fair Remote;   Fair  Judgement:  Fair  Insight:  Fair  Psychomotor Activity:  Decreased  Concentration:  Concentration: Fair  Recall:  [employer] of Knowledge:  Fair  Language:  Fair  Akathisia:  No  Handed:  Right  AIMS (if indicated):     Assets:  Desire for Improvement Housing Physical Health  ADL's:  Impaired  Cognition:  WNL  Sleep:  Number of Hours: 5.25     Treatment Plan Summary: Daily contact with patient to assess and evaluate symptoms and progress in treatment, Medication management and Plan Tentative plan for discharge tomorrow.  We are still waiting to get his catheter removed before discharge I hope.  No change to medicine.  We will go ahead and start preparing prescriptions  Alethia Berthold, MD 04/02/2019, 4:28 PM

## 2019-04-02 NOTE — [facility] Group Notes (Signed)
[place] Group Notes:  (Nursing/MHT/Case Management/Adjunct)  Date:  04/02/2019  Time:  9:10 PM  Type of Therapy:  Group Therapy  Participation Level:  Active  Participation Quality:  Appropriate  Affect:  Appropriate  Cognitive:  Appropriate  Insight:  Appropriate and Good  Engagement in Group:  Engaged  Modes of Intervention:  Support  Summary of Progress/Problems:  Glenn Rasmussen 04/02/2019, 9:10 PM

## 2019-04-02 NOTE — Plan of Care (Signed)
Pt denies depression, anxiety, SI, HI and AVH. Pt has a goal "to walk around the unit". Pt was educated on care plan and verbalizes understanding. Collier Bullock RN Problem: Education: Goal: Knowledge of [facility] Education information/materials will improve Outcome: Progressing Goal: Emotional status will improve Outcome: Progressing Goal: Mental status will improve Outcome: Progressing Goal: Verbalization of understanding the information provided will improve Outcome: Progressing   Problem: Health Behavior/Discharge Planning: Goal: Compliance with treatment plan for underlying cause of condition will improve Outcome: Progressing   Problem: Safety: Goal: Periods of time without injury will increase Outcome: Progressing   Problem: Coping: Goal: Coping ability will improve Outcome: Progressing Goal: Will verbalize feelings Outcome: Progressing   Problem: Self-Concept: Goal: Ability to identify factors that promote anxiety will improve Outcome: Progressing Goal: Level of anxiety will decrease Outcome: Progressing

## 2019-04-02 NOTE — Progress Notes (Signed)
Patient remains on 1:1 for safety with a safety sitter present. Patient was compliant with medication administration this evening. Patient is pleasant and was without complaint this evening. Patient is asleep and remains safe on the unit.

## 2019-04-02 NOTE — Progress Notes (Signed)
   04/02/19 1400  Clinical Encounter Type  Visited With Patient;Other (Comment)  Visit Type Follow-up;Spiritual support;Social support  Referral From Chaplain  Consult/Referral To Chaplain  Spiritual Encounters  Spiritual Needs Emotional;Other (Comment)  Stress Factors  Patient Stress Factors Major life changes  Chaplain visit patient in dayroom watching tv with a sitter. Patient seemed to be excited to see Chaplain. Patient was sitting in chair with walker next to him. Patient spoke of his progress in walking with walker and also not using it as well. Patient seemed very proud of his accomplishments. He spoke of his gratefulness to live and be able to walk. Patient talked about desiring to remember what happened during his suicidial attempt. Patient is looking forward to going home tomorrow, he shared with Chaplain that his mother was going to come to his apartment to be there with him. He is looking forward to her assistance. He believes it will help their relationship. Chaplain attended group with patient and the topic was on what a balance life looks like verses an unbalance life. The patient interacted well with facilitator and other patients. Patient shared what he felt an balance and unbalanced life looked like an the coping skills he would use to maintain a balance life. He would listen to music and garden. Patient said he would used his family as his support system.  Patient said it is important to identify what his problems are and to realize his family dynamics and what is good for him. Balancing out his life and not allowing things to be put on him from other family members. Patient also said he will talk to his family about his issues. Patient also talk about his triggers and how to cope with life.

## 2019-04-02 NOTE — Plan of Care (Signed)
Patient in good spirits this evening saying he can't wait to discharge and start rehabbing to get back to back to his old weight and be able to walk better.   Problem: Education: Goal: Emotional status will improve Outcome: Not Progressing Goal: Mental status will improve Outcome: Not Progressing

## 2019-04-02 NOTE — Progress Notes (Signed)
Patient on 1:1 for safety and with a safety sitter present. Patient was compliant with medications this morning per MD orders. Patient is alert and active on the unit and without complaint. Patient remains safe on the unit.

## 2019-04-02 NOTE — Progress Notes (Signed)
Recreation Therapy Notes    Date: 04/02/2019  Time: 9:30 am  Location: Craft room  Behavioral response: Appropriate  Intervention Topic: Coping skills  Discussion/Intervention:  Group content on today was focused on coping skills. The group defined what coping skills are and when they can be used. Individuals described how they normally cope with thing and the coping skills they normally use. Patients expressed why it is important to cope with things and how not coping with things can affect you. The group participated in the intervention "My coping box" and made coping boxes while adding coping skills they could use in the future to the box. Clinical Observations/Feedback:  Patient came to group and defined coping skills as how he deals with emotions. He explained that using coping skills gives him an outlook and perspective. Individual was social with peers and staff while participating in group. Florencia Zaccaro LRT/CTRS         Nalin Mazzocco 04/02/2019 11:08 AM

## 2019-04-03 ENCOUNTER — Other Ambulatory Visit (INDEPENDENT_AMBULATORY_CARE_PROVIDER_SITE_OTHER): Payer: Self-pay | Admitting: Vascular Surgery

## 2019-04-03 ENCOUNTER — Encounter: Payer: Self-pay | Admitting: Vascular Surgery

## 2019-04-03 ENCOUNTER — Encounter
Admission: AD | Disposition: A | Discharge status: Home / Self Care | Payer: Self-pay | Source: Intra-hospital | Attending: Psychiatry

## 2019-04-03 DIAGNOSIS — Z452 Encounter for adjustment and management of vascular access device: Secondary | ICD-10-CM

## 2019-04-03 DIAGNOSIS — Z87448 Personal history of other diseases of urinary system: Secondary | ICD-10-CM

## 2019-04-03 HISTORY — PX: DIALYSIS/PERMA CATHETER REMOVAL: CATH118289

## 2019-04-03 SURGERY — DIALYSIS/PERMA CATHETER REMOVAL

## 2019-04-03 MED ORDER — CLONAZEPAM 0.5 MG PO TABS: 0.5000 mg | ORAL_TABLET | Freq: Every day | ORAL | 0 refills | Status: DC | PRN

## 2019-04-03 MED ORDER — VENLAFAXINE HCL ER 150 MG PO CP24: 150.0000 mg | ORAL_CAPSULE | Freq: Every day | ORAL | 1 refills | Status: DC

## 2019-04-03 MED ORDER — PANTOPRAZOLE SODIUM 40 MG PO TBEC: 40.0000 mg | DELAYED_RELEASE_TABLET | Freq: Every day | ORAL | 1 refills | Status: DC

## 2019-04-03 MED ORDER — LIDOCAINE-EPINEPHRINE (PF) 1 %-1:200000 IJ SOLN
INTRAMUSCULAR | Status: DC | PRN
  Administered 2019-04-03: 10 mL

## 2019-04-03 MED ORDER — POLYETHYLENE GLYCOL 3350 17 G PO PACK: 17.0000 g | PACK | Freq: Every day | ORAL | 1 refills | Status: DC | PRN

## 2019-04-03 MED ORDER — ASPIRIN 325 MG PO TBEC: 325.0000 mg | DELAYED_RELEASE_TABLET | Freq: Every day | ORAL | 1 refills | Status: DC

## 2019-04-03 MED ORDER — TRAZODONE HCL 100 MG PO TABS: 200.0000 mg | ORAL_TABLET | Freq: Every day | ORAL | 1 refills | Status: DC

## 2019-04-03 SURGICAL SUPPLY — 2 items
FORCEPS HALSTEAD CVD 5IN STRL (INSTRUMENTS) ×2 IMPLANT
TRAY LACERAT/PLASTIC (MISCELLANEOUS) ×2 IMPLANT

## 2019-04-03 NOTE — Progress Notes (Signed)
Pt was educated on discharge plan and verbalizes understanding. Pt received AVS, suicide risk assessment, transition record, prescriptions, medications and belongings. Pt denied SI, HI, AVH. Collier Bullock RN

## 2019-04-03 NOTE — Progress Notes (Signed)
[facility] Daily Progress Note   Vascular Surgery Plan: Patients renal function has recovered and no longer needs dialysis. Plan to remove the patients permcath this afternoon with Dr. Lucky Cowboy  Discussed with Dr. Ellis Parents [facility] PA-C 04/03/2019 10:26 AM

## 2019-04-03 NOTE — Discharge Summary (Signed)
Physician Discharge Summary Note  Patient:  Glenn Rasmussen is an 28 y.o., male MRN:  [id number] DOB:  August 07, 1991 Patient phone:  [phone] (home)  Patient address:   [address] [place] [address],  Total Time spent with patient: 45 minutes  Date of Admission:  03/30/2019 Date of Discharge: April 03, 2019  Reason for Admission: Admitted in transfer from the medical service after recovering from an overdose  Principal Problem: Major depressive disorder, recurrent severe without psychotic features [facility]) Discharge Diagnoses: Principal Problem:   Major depressive disorder, recurrent severe without psychotic features ([place]) Active Problems:   Multiple drug overdose   Suicide attempt ([place])   HTN (hypertension)   Past Psychiatric History: Past history of depression no previous suicide attempts  Past Medical History:  Past Medical History:  Diagnosis Date  . Hypertension   . IBS (irritable bowel syndrome)     Past Surgical History:  Procedure Laterality Date  . APPENDECTOMY    . CHOLECYSTECTOMY    . DIALYSIS/PERMA CATHETER INSERTION N/A 03/26/2019   Procedure: DIALYSIS/PERMA CATHETER INSERTION;  Surgeon: Algernon Huxley, MD;  Location: [place] [facility];  Service: Cardiovascular;  Laterality: N/A;  . DIALYSIS/PERMA CATHETER REMOVAL N/A 04/03/2019   Procedure: DIALYSIS/PERMA CATHETER REMOVAL;  Surgeon: Algernon Huxley, MD;  Location: [place] [facility];  Service: Cardiovascular;  Laterality: N/A;  . TONSILLECTOMY     Family History:  Family History  Problem Relation Age of Onset  . Lung cancer Mother   . Lung cancer Father    Family Psychiatric  History: None Social History:  Social History   Substance and Sexual Activity  Alcohol Use No  . Frequency: Never     Social History   Substance and Sexual Activity  Drug Use No    Social History   Socioeconomic History  . Marital status: Single    Spouse name: Not on file  . Number of children: Not on file  .  Years of education: Not on file  . Highest education level: Not on file  Occupational History  . Not on file  Social Needs  . Financial resource strain: Not on file  . Food insecurity    Worry: Not on file    Inability: Not on file  . Transportation needs    Medical: Not on file    Non-medical: Not on file  Tobacco Use  . Smoking status: Never Smoker  . Smokeless tobacco: Never Used  Substance and Sexual Activity  . Alcohol use: No    Frequency: Never  . Drug use: No  . Sexual activity: Not on file  Lifestyle  . Physical activity    Days per week: Not on file    Minutes per session: Not on file  . Stress: Not on file  Relationships  . Social Herbalist on phone: Not on file    Gets together: Not on file    Attends religious service: Not on file    Active member of club or organization: Not on file    Attends meetings of clubs or organizations: Not on file    Relationship status: Not on file  Other Topics Concern  . Not on file  Social History Narrative  . Not on file    Hospital Course: Admitted to the psychiatric ward.  Patient required a sitter because of the presence of a dialysis catheter and initially being in a wheelchair.  Physical therapy was consulted and the patient  was able to use a walker and his strength greatly improved.  Laboratory studies improving without the need of any further dialysis.  Patient tolerated rated antidepressant medicine appropriately.  He engaged appropriately in conversations and activities with patients and staff.  Consistently denied any suicidal thoughts at all.  Patient was agreeable to follow-up with psychiatric treatment in the community.  Prior to discharge the dialysis catheter was removed.  Physical Findings: AIMS: Facial and Oral Movements Muscles of Facial Expression: None, normal Lips and Perioral Area: None, normal Jaw: None, normal Tongue: None, normal,Extremity Movements Upper (arms, wrists, hands, fingers):  None, normal Lower (legs, knees, ankles, toes): None, normal, Trunk Movements Neck, shoulders, hips: None, normal, Overall Severity Severity of abnormal movements (highest score from questions above): None, normal Incapacitation due to abnormal movements: None, normal Patient's awareness of abnormal movements (rate only patient's report): No Awareness, Dental Status Current problems with teeth and/or dentures?: No Does patient usually wear dentures?: No  CIWA:  CIWA-Ar Total: 0 COWS:  COWS Total Score: 1  Musculoskeletal: Strength & Muscle Tone: within normal limits Gait & Station: normal Patient leans: N/A  Psychiatric Specialty Exam: Physical Exam  Nursing note and vitals reviewed. Constitutional: He appears well-developed and well-nourished.  HENT:  Head: Normocephalic and atraumatic.  Eyes: Pupils are equal, round, and reactive to light. Conjunctivae are normal.  Neck: Normal range of motion.  Cardiovascular: Regular rhythm and normal heart sounds.  Respiratory: Effort normal.  GI: Soft.  Musculoskeletal: Normal range of motion.  Neurological: He is alert.  Skin: Skin is warm and dry.  Psychiatric: He has a normal mood and affect. His behavior is normal. Judgment and thought content normal.    Review of Systems  Constitutional: Negative.   HENT: Negative.   Eyes: Negative.   Respiratory: Negative.   Cardiovascular: Negative.   Gastrointestinal: Negative.   Musculoskeletal: Negative.   Skin: Negative.   Neurological: Negative.   Psychiatric/Behavioral: Negative.     Blood pressure (!) 142/94, pulse 68, temperature 98.4 F (36.9 C), temperature source Oral, resp. rate 20, height 5\' 7"  (1.702 m), weight 78 kg, SpO2 98 %.Body mass index is 26.94 kg/m.  General Appearance: Casual  Eye Contact:  Good  Speech:  Clear and Coherent  Volume:  Normal  Mood:  Euthymic  Affect:  Congruent  Thought Process:  Goal Directed  Orientation:  Full (Time, Place, and Person)   Thought Content:  Logical  Suicidal Thoughts:  No  Homicidal Thoughts:  No  Memory:  Immediate;   Fair Recent;   Fair Remote;   Fair  Judgement:  Impaired  Insight:  Shallow  Psychomotor Activity:  Decreased  Concentration:  Concentration: Fair  Recall:  [employer] of Knowledge:  Fair  Language:  Poor  Akathisia:  No  Handed:  Right  AIMS (if indicated):     Assets:  Desire for Improvement Housing Resilience  ADL's:  Intact  Cognition:  WNL  Sleep:  Number of Hours: 5     Have you used any form of tobacco in the last 30 days? (Cigarettes, Smokeless Tobacco, Cigars, and/or Pipes): No  Has this patient used any form of tobacco in the last 30 days? (Cigarettes, Smokeless Tobacco, Cigars, and/or Pipes) Yes, No  Blood Alcohol level:  Lab Results  Component Value Date   ETH <10 99/37/1696    Metabolic Disorder Labs:  Lab Results  Component Value Date   HGBA1C 5.5 03/13/2019   MPG 111 03/13/2019   No results  found for: PROLACTIN Lab Results  Component Value Date   CHOL 154 03/13/2019   TRIG 263 (H) 03/23/2019   HDL 32 (L) 03/13/2019   CHOLHDL 4.8 03/13/2019   VLDL 52 (H) 03/13/2019   LDLCALC 70 03/13/2019    See Psychiatric Specialty Exam and Suicide Risk Assessment completed by Attending Physician prior to discharge.  Discharge destination:  Home  Is patient on multiple antipsychotic therapies at discharge:  No   Has Patient had three or more failed trials of antipsychotic monotherapy by history:  No  Recommended Plan for Multiple Antipsychotic Therapies: NA  Discharge Instructions    Diet - low sodium heart healthy   Complete by: As directed    Increase activity slowly   Complete by: As directed      Allergies as of 04/03/2019   No Known Allergies     Medication List    STOP taking these medications   dicyclomine 20 MG tablet Commonly known as: BENTYL   QUEtiapine 25 MG tablet Commonly known as: SEROQUEL     TAKE these medications      Indication  aspirin 325 MG EC tablet Take 1 tablet (325 mg total) by mouth daily.  Indication: Stable Angina Pectoris   clonazePAM 0.5 MG tablet Commonly known as: KLONOPIN Take 1 tablet (0.5 mg total) by mouth daily as needed (anxiety). What changed:   how much to take  when to take this  reasons to take this  additional instructions  Indication: Panic Disorder   pantoprazole 40 MG tablet Commonly known as: PROTONIX Take 1 tablet (40 mg total) by mouth at bedtime.  Indication: Gastroesophageal Reflux Disease   polyethylene glycol 17 g packet Commonly known as: MIRALAX / GLYCOLAX Place 17 g into feeding tube daily as needed for mild constipation or moderate constipation.  Indication: Constipation   traZODone 100 MG tablet Commonly known as: DESYREL Take 2 tablets (200 mg total) by mouth at bedtime.  Indication: Trouble Sleeping   venlafaxine XR 150 MG 24 hr capsule Commonly known as: EFFEXOR-XR Take 1 capsule (150 mg total) by mouth daily with breakfast.  Indication: Major Depressive Disorder      Follow-up Information    [facility] [place] [facility] up on 04/14/2019.   Why: Please follow up with [facility] on Tuesday, July 7th at [place] take you discharge paperwork and insurance card with you to your appointment.  If you need to cancel or reschedule, you must give a 24 hour notice.  Thank you. Contact information: [place] [address] [phone]           Follow-up recommendations:  Activity:  Activity as tolerated Diet:  Regular diet Other:  Follow-up with outpatient mental health care as referred  Comments: Prescriptions given at discharge.  Patient is agreeable to plan.  No sign of acute dangerousness.  No need for further IVC.  Signed: Alethia Berthold, MD 04/03/2019, 6:05 PM

## 2019-04-03 NOTE — Progress Notes (Signed)
Recreation Therapy Notes  Date: 04/03/2019  Time: 9:30 am  Location: Craft room  Behavioral response: Appropriate  Intervention Topic: Self-care  Discussion/Intervention:  Group content today was focused on Self-Care. The group defined self-care and some positive ways they care for themselves. Individuals expressed ways and reasons why they neglected any self-care in the past. Patients described ways to improve self-care in the future. The group explained what could happen if they did not do any self-care activities at all. The group participated in the intervention "self-care assessment" where they had a chance to discover some of their weaknesses and strengths in self- care. Patient came up with a self-care plan to improve themselves in the future.  Clinical Observations/Feedback:  Patient came to group and defined self care as taking care of your self. He explained that self-care includes physical and mental aspects. Participant stated self-care promotes balance in life. Individual wa social with peers and staff while participating in group.  Tennyson Kallen LRT/CTRS          Mela Perham 04/03/2019 11:09 AM

## 2019-04-03 NOTE — Progress Notes (Signed)
Recreation Therapy Notes  INPATIENT RECREATION TR PLAN  Patient Details Name: Glenn Rasmussen MRN: [id number] DOB: Jan 17, 1991 Today's Date: 04/03/2019  Rec Therapy Plan Is patient appropriate for Therapeutic Recreation?: Yes Treatment times per week: at least 3 Estimated Length of Stay: 5-7 days TR Treatment/Interventions: Group participation (Comment)  Discharge Criteria Pt will be discharged from therapy if:: Discharged Treatment plan/goals/alternatives discussed and agreed upon by:: Patient/family  Discharge Summary Short term goals set: Patient will identify 3 positive coping skills strategies to use post d/c within 5 recreation therapy group sessions Short term goals met: Complete Progress toward goals comments: Groups attended Which groups?: Coping skills, Goal setting, Other (Comment)(Time Management, Self-care) Reason goals not met: N/A Therapeutic equipment acquired: N/A Reason patient discharged from therapy: Discharge from hospital Pt/family agrees with progress & goals achieved: Yes Date patient discharged from therapy: 04/03/19   Avrian Delfavero 04/03/2019, 11:17 AM

## 2019-04-03 NOTE — Progress Notes (Signed)
  [facility] Adult Case Management Discharge Plan :  Will you be returning to the same living situation after discharge:  Yes,  home At discharge, do you have transportation home?: Yes,  will call ride Do you have the ability to pay for your medications: Yes,  BC[employer] insurance  Release of information consent forms completed and in the chart;   Patient to Follow up at: Follow-up Information    [facility] up on 04/14/2019.   Why: Please follow up with [facility] on Tuesday, July 7th at [place] take you discharge paperwork and insurance card with you to your appointment.  If you need to cancel or reschedule, you must give a 24 hour notice.  Thank you. Contact information: [place] [address] [phone]           Next level of care provider has access to [place] and Suicide Prevention discussed: Yes,  SPE completed with pt as pt declined collateral contact  Have you used any form of tobacco in the last 30 days? (Cigarettes, Smokeless Tobacco, Cigars, and/or Pipes): No  Has patient been referred to the Quitline?: N/A patient is not a smoker  Patient has been referred for addiction treatment: N/A  Delfin Edis, LCSW 04/03/2019, 10:12 AM

## 2019-04-03 NOTE — Op Note (Signed)
Operative Note     Preoperative diagnosis:   1.  Renal failure with recovery of renal function  Postoperative diagnosis:  1.  As above  Procedure:  Removal of right jugular Permcath  Surgeon:  Leotis Pain, MD  Anesthesia:  Local  EBL:  Minimal  Indication for the Procedure:  The patient has previously had a PermCath placed for renal failure.  He has recovered his renal function and no longer requires dialysis and the PermCath is no longer being used.  This can be removed.  Risks and benefits are discussed and informed consent is obtained.  Description of the Procedure:  The patient's right neck, chest and existing catheter were sterilely prepped and draped. The area around the catheter was anesthetized copiously with 1% lidocaine. The catheter was dissected out with curved hemostats until the cuff was freed from the surrounding fibrous sheath. The fiber sheath was transected, and the catheter was then removed in its entirety using gentle traction. Pressure was held and sterile dressings were placed. The patient tolerated the procedure well and was taken to the recovery room in stable condition.     Leotis Pain  04/03/2019, 2:22 PM This note was created with Dragon Medical transcription system. Any errors in dictation are purely unintentional.

## 2019-04-03 NOTE — Plan of Care (Signed)
Pt denies depression, anxiety, SI, HI and Avh. Pt was educated on care plan and verbalizes understanding. Collier Bullock RN Problem: Education: Goal: Knowledge of [facility] Education information/materials will improve Outcome: Progressing Goal: Emotional status will improve Outcome: Progressing Goal: Mental status will improve Outcome: Progressing Goal: Verbalization of understanding the information provided will improve Outcome: Progressing   Problem: Health Behavior/Discharge Planning: Goal: Compliance with treatment plan for underlying cause of condition will improve Outcome: Progressing   Problem: Safety: Goal: Periods of time without injury will increase Outcome: Progressing   Problem: Health Behavior/Discharge Planning: Goal: Compliance with treatment plan for underlying cause of condition will improve Outcome: Progressing   Problem: Safety: Goal: Periods of time without injury will increase Outcome: Progressing   Problem: Coping: Goal: Coping ability will improve Outcome: Progressing Goal: Will verbalize feelings Outcome: Progressing   Problem: Self-Concept: Goal: Ability to identify factors that promote anxiety will improve Outcome: Progressing Goal: Level of anxiety will decrease Outcome: Progressing

## 2019-04-03 NOTE — Plan of Care (Signed)
  Problem: Coping: Goal: Coping ability will improve Outcome: Progressing   Problem: Coping: Goal: Coping ability will improve Outcome: Progressing  Patient is coping effectively, interacting with peers and staff, and denies SI/HI/AVH.

## 2019-04-03 NOTE — Plan of Care (Signed)
  Problem: Education: Goal: Knowledge of [facility] Education information/materials will improve 04/03/2019 1434 by Kieth Brightly, RN Outcome: Adequate for Discharge 04/03/2019 [phone] by Kieth Brightly, RN Outcome: Progressing Goal: Emotional status will improve 04/03/2019 1434 by Kieth Brightly, RN Outcome: Adequate for Discharge 04/03/2019 0949 by Kieth Brightly, RN Outcome: Progressing Goal: Mental status will improve 04/03/2019 1434 by Kieth Brightly, RN Outcome: Adequate for Discharge 04/03/2019 [phone] by Kieth Brightly, RN Outcome: Progressing Goal: Verbalization of understanding the information provided will improve 04/03/2019 1434 by Kieth Brightly, RN Outcome: Adequate for Discharge 04/03/2019 0949 by Kieth Brightly, RN Outcome: Progressing   Problem: Health Behavior/Discharge Planning: Goal: Compliance with treatment plan for underlying cause of condition will improve 04/03/2019 1434 by Kieth Brightly, RN Outcome: Adequate for Discharge 04/03/2019 0949 by Kieth Brightly, RN Outcome: Progressing   Problem: Safety: Goal: Periods of time without injury will increase 04/03/2019 1434 by Kieth Brightly, RN Outcome: Adequate for Discharge 04/03/2019 0949 by Kieth Brightly, RN Outcome: Progressing   Problem: Coping: Goal: Coping ability will improve 04/03/2019 1434 by Kieth Brightly, RN Outcome: Adequate for Discharge 04/03/2019 0949 by Kieth Brightly, RN Outcome: Progressing Goal: Will verbalize feelings 04/03/2019 1434 by Kieth Brightly, RN Outcome: Adequate for Discharge 04/03/2019 [phone] by Kieth Brightly, RN Outcome: Progressing   Problem: Self-Concept: Goal: Ability to identify factors that promote anxiety will improve 04/03/2019 1434 by Kieth Brightly, RN Outcome: Adequate for Discharge 04/03/2019 0949 by Kieth Brightly, RN Outcome: Progressing Goal: Level of anxiety will decrease 04/03/2019 1434 by Kieth Brightly, RN Outcome: Adequate for  Discharge 04/03/2019 0949 by Kieth Brightly, RN Outcome: Progressing

## 2019-04-03 NOTE — H&P (Signed)
[facility] History & Physical Update  The patient was interviewed and re-examined.  The patient's previous History and Physical has been reviewed and is unchanged. The patient has been maintaining UOP and Cr and nephrology feels we can safely take out the catheter at this point. We plan to proceed with the scheduled procedure.  Leotis Pain, MD  04/03/2019, 2:14 PM

## 2019-04-03 NOTE — Progress Notes (Signed)
Discussed with patient's RN: Tunneled HD cath will need to be removed by MD or radiology.

## 2019-04-03 NOTE — Progress Notes (Signed)
[facility]  ROUNDING NOTE   Subjective:   Patient is doing well.   He is ambulatory in the hallways.  No leg edema.  States appetite is good.  Serum creatinine is down to 1.98.   Objective:  Vital signs in last 24 hours:  Temp:  [97.5 F (36.4 C)-98.4 F (36.9 C)] 98.4 F (36.9 C) (06/26 1355) Pulse Rate:  [66-95] 68 (06/26 1440) Resp:  [18-20] 20 (06/26 1440) BP: (127-142)/(84-96) 142/94 (06/26 1440) SpO2:  [89 %-98 %] 98 % (06/26 1440) Weight:  [78 kg] 78 kg (06/26 1355)  Weight change:  Filed Weights   03/30/19 1534 04/03/19 1355  Weight: 78 kg 78 kg    Intake/Output: No intake/output data recorded.   Intake/Output this shift:  No intake/output data recorded.  Physical Exam: General:  Ambulatory.  No acute distress  Head: [place]/AT  Eyes: Anicteric,   Lungs:  clear  Heart: regular  Abdomen:  Soft, obese   Extremities:  no peripheral edema.  Neurologic: Nonfocal, moving all four extremities  Skin: No lesions  Access: RIJ permcath 5/70    Basic Metabolic Panel: Recent Labs  Lab 03/28/19 0726 03/29/19 0614 03/31/19 1455 04/02/19 0719  NA  --  135 137 138  K  --  3.8 4.4 4.4  CL  --  100 106 106  CO2  --  22 19* 20*  GLUCOSE  --  83 93 107*  BUN  --  56* 42* 35*  CREATININE  --  2.56* 2.17* 1.98*  CALCIUM  --  9.5 9.6 9.7  MG 1.9 2.1  --   --   PHOS  --  3.7  --   --     Liver Function Tests: Recent Labs  Lab 03/29/19 0614  ALBUMIN 4.6   No results for input(s): LIPASE, AMYLASE in the last 168 hours. No results for input(s): AMMONIA in the last 168 hours.  CBC: No results for input(s): WBC, NEUTROABS, HGB, HCT, MCV, PLT in the last 168 hours.  Cardiac Enzymes: No results for input(s): CKTOTAL, CKMB, CKMBINDEX, TROPONINI in the last 168 hours.  BNP: Invalid input(s): POCBNP  CBG: Recent Labs  Lab 03/28/19 0808 03/28/19 1158 03/28/19 1630 03/28/19 2054 03/29/19 0744  GLUCAP 116* 79 133* 91 104*     Microbiology: Results for orders placed or performed during the hospital encounter of 03/08/19  SARS Coronavirus 2 (CEPHEID - Performed in [place] hospital lab), Hosp Order     Status: None   Collection Time: 03/08/19 11:48 PM   Specimen: Nasopharyngeal Swab  Result Value Ref Range Status   SARS Coronavirus 2 NEGATIVE NEGATIVE Final    Comment: (NOTE) If result is NEGATIVE SARS-CoV-2 target nucleic acids are NOT DETECTED. The SARS-CoV-2 RNA is generally detectable in upper and lower  respiratory specimens during the acute phase of infection. The lowest  concentration of SARS-CoV-2 viral copies this assay can detect is 250  copies / mL. A negative result does not preclude SARS-CoV-2 infection  and should not be used as the sole basis for treatment or other  patient management decisions.  A negative result may occur with  improper specimen collection / handling, submission of specimen other  than nasopharyngeal swab, presence of viral mutation(s) within the  areas targeted by this assay, and inadequate number of viral copies  (<250 copies / mL). A negative result must be combined with clinical  observations, patient history, and epidemiological information. If result is POSITIVE SARS-CoV-2 target nucleic acids  are DETECTED. The SARS-CoV-2 RNA is generally detectable in upper and lower  respiratory specimens dur ing the acute phase of infection.  Positive  results are indicative of active infection with SARS-CoV-2.  Clinical  correlation with patient history and other diagnostic information is  necessary to determine patient infection status.  Positive results do  not rule out bacterial infection or co-infection with other viruses. If result is PRESUMPTIVE POSTIVE SARS-CoV-2 nucleic acids MAY BE PRESENT.   A presumptive positive result was obtained on the submitted specimen  and confirmed on repeat testing.  While 2019 novel coronavirus  (SARS-CoV-2) nucleic acids may be  present in the submitted sample  additional confirmatory testing may be necessary for epidemiological  and / or clinical management purposes  to differentiate between  SARS-CoV-2 and other Sarbecovirus currently known to infect humans.  If clinically indicated additional testing with an alternate test  methodology [phone]) is advised. The SARS-CoV-2 RNA is generally  detectable in upper and lower respiratory sp ecimens during the acute  phase of infection. The expected result is Negative. Fact Sheet for Patients:  StrictlyIdeas.no Fact Sheet for Healthcare Providers: BankingDealers.co.za This test is not yet approved or cleared by the [place] FDA and has been authorized for detection and/or diagnosis of SARS-CoV-2 by FDA under an Emergency Use Authorization (EUA).  This EUA will remain in effect (meaning this test can be used) for the duration of the COVID-19 declaration under Section 564(b)(1) of the Act, 21 U.S.C. section 360bbb-3(b)(1), unless the authorization is terminated or revoked sooner. Performed at [facility], [address] [place], [place] [address]   MRSA PCR Screening     Status: None   Collection Time: 03/09/19  2:48 AM   Specimen: Nasal Mucosa; Nasopharyngeal  Result Value Ref Range Status   MRSA by PCR NEGATIVE NEGATIVE Final    Comment:        The GeneXpert MRSA Assay (FDA approved for NASAL specimens only), is one component of a comprehensive MRSA colonization surveillance program. It is not intended to diagnose MRSA infection nor to guide or monitor treatment for MRSA infections. Performed at [facility], [address] [place], [place] [address]   Culture, respiratory (non-expectorated)     Status: None   Collection Time: 03/09/19  8:00 AM   Specimen: Tracheal Aspirate; Respiratory  Result Value Ref Range Status   Specimen Description   Final    TRACHEAL ASPIRATE Performed  at [facility], [address] [place], [place] [address]    Special Requests   Final    NONE Performed at [facility], [address] [place], [place] [address]    Gram Stain   Final    ABUNDANT WBC PRESENT,BOTH PMN AND MONONUCLEAR MODERATE GRAM POSITIVE COCCI RARE YEAST    Culture   Final    MODERATE GROUP B STREP(S.AGALACTIAE)ISOLATED TESTING AGAINST S. AGALACTIAE NOT ROUTINELY PERFORMED DUE TO PREDICTABILITY OF AMP/PEN/VAN SUSCEPTIBILITY. Performed at [facility], [place] [address] [place], [place] [address]    Report Status 03/11/2019 FINAL  Final  CULTURE, BLOOD (ROUTINE X 2) w Reflex to ID Panel     Status: None   Collection Time: 03/09/19  2:53 PM   Specimen: BLOOD  Result Value Ref Range Status   Specimen Description   Final    BLOOD BLOOD RIGHT ARM Performed at [facility], [address], [place],  [address]    Special Requests   Final  BOTTLES DRAWN AEROBIC AND ANAEROBIC Blood Culture results may not be optimal due to an excessive volume of blood received in culture bottles Performed at [facility], [address], [place], [place] [address]    Culture   Final    NO GROWTH 5 DAYS Performed at [facility] [address] [place], [place] [address]    Report Status 03/17/2019 FINAL  Final  CULTURE, BLOOD (ROUTINE X 2) w Reflex to ID Panel     Status: None   Collection Time: 03/09/19  5:00 PM   Specimen: BLOOD RIGHT ARM  Result Value Ref Range Status   Specimen Description   Final    BLOOD RIGHT ARM Performed at [facility], [address] [place], [place] [address]    Special Requests   Final    BOTTLES DRAWN AEROBIC AND ANAEROBIC Blood Culture results may not be optimal due to an excessive volume of blood received in culture bottles Performed at [facility], [address], [place], [place] [address]    Culture   Final    NO GROWTH 5 DAYS Performed at [facility], [place] [address], [place], [place] [address]    Report Status 03/15/2019 FINAL  Final  Urine Culture     Status: None   Collection Time: 03/11/19  4:33 AM   Specimen: Nasal Mucosa; Urine  Result Value Ref Range Status   Specimen Description   Final    URINE, RANDOM Performed at [facility], [address], [place], [place] [address]    Special Requests   Final    NONE Performed at [facility], [address], [place], [place] [address]    Culture   Final    NO GROWTH Performed at [facility], [place] [address] [place], [place] [address]    Report Status 03/12/2019 FINAL  Final    Coagulation Studies: No results for input(s): LABPROT, INR in the last 72 hours.  Urinalysis: No results for input(s): COLORURINE, LABSPEC, PHURINE, GLUCOSEU, HGBUR, BILIRUBINUR, KETONESUR, PROTEINUR, UROBILINOGEN, NITRITE, LEUKOCYTESUR in the last 72 hours.  Invalid input(s): APPERANCEUR    Imaging: No results found.   Medications:    . aspirin EC  325 mg Oral Daily  . Chlorhexidine Gluconate Cloth  6 each Topical Q0600  . feeding supplement (ENSURE ENLIVE)  237 mL Oral TID BM  . feeding supplement (NEPRO CARB STEADY)  237 mL Oral BID BM  . multivitamin with minerals  1 tablet Oral Daily  . pantoprazole  40 mg Oral QHS  . traZODone  200 mg Oral QHS  . venlafaxine XR  150 mg Oral Q breakfast   acetaminophen, alum & mag hydroxide-simeth, clonazePAM, ibuprofen, magnesium hydroxide, ondansetron, polyethylene glycol  Assessment/ Plan:  Glenn Rasmussen is a 28 y.o. white male with hypertension, irritable bowel syndrome, depression with overdose. Complicated with acute respiratory failure requiring mechanical ventilation, ileus and acute renal failure requiring renal replacement therapy. Now with acute renal failure requiring hemodialysis.   1. Acute renal failure: CRRT on 6/1 -6/6. First intermittent hemodialysis treatment was 6/8. Hemodialysis treatments  on 6/10, 6/12, 6/13, 6/15, 6/17 and 6/19.  Permcath used for the first time on 6/19 Nonoliguric urine output.   Creatinine on admission 1.26, normal GFR Serum creatinine is down down to 1.98.  Clinically patient feels well.  No further need for dialysis is anticipated.  We have contacted vascular surgery to remove permacath.  Plan to  follow-up with patient in the outpatient setting  2. Anemia with renal failure:   Lab Results  Component Value Date   HGB 9.0 (L) 03/27/2019   Monitor closely  3. Depression:  - currently in psych in-patient    LOS: [place] 6/26/20203:03 PM

## 2019-04-03 NOTE — BHH Group Notes (Signed)
LCSW Group Therapy Note  04/03/2019 2:03 PM  Type of Therapy/Topic:  Group Therapy:  Balance in Life  Participation Level:  Active  Description of Group:    This group will address the concept of balance and how it feels and looks when one is unbalanced. Patients will be encouraged to process areas in their lives that are out of balance and identify reasons for remaining unbalanced. Facilitators will guide patients in utilizing problem-solving interventions to address and correct the stressor making their life unbalanced. Understanding and applying boundaries will be explored and addressed for obtaining and maintaining a balanced life. Patients will be encouraged to explore ways to assertively make their unbalanced needs known to significant others in their lives, using other group members and facilitator for support and feedback.  Therapeutic Goals: 1. Patient will identify two or more emotions or situations they have that consume much of in their lives. 2. Patient will identify signs/triggers that life has become out of balance:  3. Patient will identify two ways to set boundaries in order to achieve balance in their lives:  4. Patient will demonstrate ability to communicate their needs through discussion and/or role plays  Summary of Patient Progress: Pt was appropriate and respectful in group. Pt was able to identify what balance means to him and reported that your attitude and perception on things can throw you off balance. Pt also discussed that knowing your triggers is a good way to maintain balance so you can avoid the triggers. Pt was active with other group members.     Therapeutic Modalities:   Cognitive Behavioral Therapy Solution-Focused Therapy Assertiveness Training  Evalina Field, MSW, LCSW Clinical Social Work 04/03/2019 2:03 PM

## 2019-04-03 NOTE — Progress Notes (Signed)
Patient alert and oriented x 4, affect is flat but he brightens upon approach, he denies SI/HI/AVH, no distress noted continues to be on 1:1 safety sitter at bedside, he was noted interacting appropriately with peers and staff. Patient appears not anxious, exited  about his discharge tomorrow and has more insight into his treatment plan. Patient was encouraged to go to wrap up group, he attended and was appropriate. Patient was complaint with medication regimen tonight, 15 minutes safety checks maintained will continue to monitor.

## 2019-04-03 NOTE — BHH Suicide Risk Assessment (Signed)
[facility] Discharge Suicide Risk Assessment   Principal Problem: Major depressive disorder, recurrent severe without psychotic features [facility]) Discharge Diagnoses: Principal Problem:   Major depressive disorder, recurrent severe without psychotic features ([place]) Active Problems:   Multiple drug overdose   Suicide attempt ([place])   HTN (hypertension)   Total Time spent with patient: 45 minutes  Musculoskeletal: Strength & Muscle Tone: within normal limits Gait & Station: normal Patient leans: N/A  Psychiatric Specialty Exam: Review of Systems  Constitutional: Negative.   HENT: Negative.   Eyes: Negative.   Respiratory: Negative.   Cardiovascular: Negative.   Gastrointestinal: Negative.   Musculoskeletal: Negative.   Skin: Negative.   Neurological: Negative.   Psychiatric/Behavioral: Negative.     Blood pressure 127/84, pulse 95, temperature (!) 97.5 F (36.4 C), temperature source Oral, resp. rate 18, height 5\' 7"  (1.702 m), weight 78 kg, SpO2 (!) 89 %.Body mass index is 26.94 kg/m.  General Appearance: Fairly Groomed  Engineer, water::  Good  Speech:  Normal Rate409  Volume:  Normal  Mood:  Euthymic  Affect:  Appropriate  Thought Process:  Goal Directed  Orientation:  Full (Time, Place, and Person)  Thought Content:  Logical  Suicidal Thoughts:  No  Homicidal Thoughts:  No  Memory:  Immediate;   Fair Recent;   Fair Remote;   Fair  Judgement:  Fair  Insight:  Fair  Psychomotor Activity:  Normal  Concentration:  Fair  Recall:  [employer] [place]  Language: Fair  Akathisia:  No  Handed:  Right  AIMS (if indicated):     Assets:  Communication Skills Desire for Improvement Financial Resources/Insurance Housing Resilience Social Support  Sleep:  Number of Hours: 5  Cognition: WNL  ADL's:  Intact   Mental Status Per Nursing Assessment::   On Admission:  NA  Demographic Factors:  Male and Caucasian  Loss Factors: NA  Historical Factors: NA  Risk  Reduction Factors:   Sense of responsibility to family, Employed, Living with another person, especially a relative and Positive social support  Continued Clinical Symptoms:  Depression:   Anhedonia  Cognitive Features That Contribute To Risk:  None    Suicide Risk:  Minimal: No identifiable suicidal ideation.  Patients presenting with no risk factors but with morbid ruminations; may be classified as minimal risk based on the severity of the depressive symptoms  Follow-up Information    [facility] up on 04/14/2019.   Why: Please follow up with [facility] on Tuesday, July 7th at [place] take you discharge paperwork and insurance card with you to your appointment.  If you need to cancel or reschedule, you must give a 24 hour notice.  Thank you. Contact information: [place] [address] [phone]           Plan Of Care/Follow-up recommendations:  Activity:  Activity as tolerated.  Could gradually work on regaining full strength Diet:  Regular diet Other:  Follow-up with outpatient care referred [facility].  Continue current medicine start therapy work on controlling emotional stress.  Alethia Berthold, MD 04/03/2019, 9:16 AM

## 2019-04-27 ENCOUNTER — Other Ambulatory Visit: Payer: Self-pay

## 2019-04-27 ENCOUNTER — Encounter: Payer: Self-pay | Admitting: Emergency Medicine

## 2019-04-27 DIAGNOSIS — M791 Myalgia, unspecified site: Secondary | ICD-10-CM | POA: Diagnosis not present

## 2019-04-27 DIAGNOSIS — R296 Repeated falls: Secondary | ICD-10-CM | POA: Insufficient documentation

## 2019-04-27 DIAGNOSIS — R42 Dizziness and giddiness: Secondary | ICD-10-CM | POA: Insufficient documentation

## 2019-04-27 DIAGNOSIS — R5383 Other fatigue: Secondary | ICD-10-CM | POA: Diagnosis not present

## 2019-04-27 DIAGNOSIS — R009 Unspecified abnormalities of heart beat: Secondary | ICD-10-CM | POA: Diagnosis not present

## 2019-04-27 DIAGNOSIS — I10 Essential (primary) hypertension: Secondary | ICD-10-CM | POA: Insufficient documentation

## 2019-04-27 DIAGNOSIS — Z7982 Long term (current) use of aspirin: Secondary | ICD-10-CM | POA: Insufficient documentation

## 2019-04-27 DIAGNOSIS — Z79899 Other long term (current) drug therapy: Secondary | ICD-10-CM | POA: Insufficient documentation

## 2019-04-27 LAB — BASIC METABOLIC PANEL
Anion gap: 12 (ref 5–15)
BUN: 6 mg/dL (ref 6–20)
CO2: 24 mmol/L (ref 22–32)
Calcium: 9.6 mg/dL (ref 8.9–10.3)
Chloride: 106 mmol/L (ref 98–111)
Creatinine, Ser: 0.64 mg/dL (ref 0.61–1.24)
GFR calc Af Amer: >60 mL/min (ref >60)
GFR calc non Af Amer: >60 mL/min (ref >60)
Glucose, Bld: 112 mg/dL — ABNORMAL HIGH (ref 70–99)
Potassium: 3.8 mmol/L (ref 3.5–5.1)
Sodium: 142 mmol/L (ref 135–145)

## 2019-04-27 LAB — CBC
HCT: 32.3 % — ABNORMAL LOW (ref 39.0–52.0)
Hemoglobin: 10.5 g/dL — ABNORMAL LOW (ref 13.0–17.0)
MCH: 26.2 pg (ref 26.0–34.0)
MCHC: 32.5 g/dL (ref 30.0–36.0)
MCV: 80.5 fL (ref 80.0–100.0)
Platelets: 442 10*3/uL — ABNORMAL HIGH (ref 150–400)
RBC: 4.01 MIL/uL — ABNORMAL LOW (ref 4.22–5.81)
RDW: 13.6 % (ref 11.5–15.5)
WBC: 14.6 10*3/uL — ABNORMAL HIGH (ref 4.0–10.5)
nRBC: 0 % (ref 0.0–0.2)

## 2019-04-27 NOTE — ED Triage Notes (Signed)
Pt states has been dizzy for one week getting worse tonight. Pt reports has felt like his heart has been beating funny so he did an EKG on himself and had some couplets and PVC's.

## 2019-04-28 ENCOUNTER — Emergency Department
Admission: EM | Admit: 2019-04-28 | Discharge: 2019-04-28 | Disposition: A | Discharge status: Home / Self Care | Payer: BC Managed Care – PPO | Attending: Emergency Medicine | Admitting: Emergency Medicine

## 2019-04-28 DIAGNOSIS — R42 Dizziness and giddiness: Secondary | ICD-10-CM

## 2019-04-28 LAB — HEPATIC FUNCTION PANEL
ALT: 34 U/L (ref 0–44)
AST: 28 U/L (ref 15–41)
Albumin: 4.6 g/dL (ref 3.5–5.0)
Alkaline Phosphatase: 66 U/L (ref 38–126)
Bilirubin, Direct: <0.1 mg/dL (ref 0.0–0.2)
Total Bilirubin: 0.6 mg/dL (ref 0.3–1.2)
Total Protein: 7.7 g/dL (ref 6.5–8.1)

## 2019-04-28 LAB — URINALYSIS, COMPLETE (UACMP) WITH MICROSCOPIC
Bacteria, UA: NONE SEEN
Bilirubin Urine: NEGATIVE
Glucose, UA: NEGATIVE mg/dL
Hgb urine dipstick: NEGATIVE
Ketones, ur: NEGATIVE mg/dL
Leukocytes,Ua: NEGATIVE
Nitrite: NEGATIVE
Protein, ur: NEGATIVE mg/dL
Specific Gravity, Urine: 1.006 (ref 1.005–1.030)
pH: 7 (ref 5.0–8.0)

## 2019-04-28 LAB — CK: Total CK: 179 U/L (ref 49–397)

## 2019-04-28 LAB — TROPONIN I (HIGH SENSITIVITY): Troponin I (High Sensitivity): 5 ng/L (ref <18)

## 2019-04-28 LAB — TSH: TSH: 2.439 u[IU]/mL (ref 0.350–4.500)

## 2019-04-28 MED ORDER — IBUPROFEN 800 MG PO TABS
800.0000 mg | ORAL_TABLET | Freq: Once | ORAL | Status: AC
  Administered 2019-04-28: 800 mg via ORAL
  Filled 2019-04-28: qty 1

## 2019-04-28 NOTE — ED Notes (Signed)
Pt verbalizes understanding of d/c instructions and follow up. 

## 2019-04-28 NOTE — ED Notes (Signed)
Patient states having dizziness for about a week. Patient states having some falls due to dizziness.

## 2019-04-28 NOTE — Discharge Instructions (Addendum)
Please call Dr. Clayborn Bigness  Let him know you are in the emergency room with the irregular heartbeat and dizziness.  He should be out of see you for follow-up fairly quickly.  I think you will probably get you on a Holter monitor to watch her heart rate for longer period of time.  Please return here if you get feeling worse at all.  That includes longer dizziness more irregular heartbeat chest tightness or any other problems.

## 2019-04-28 NOTE — ED Provider Notes (Signed)
[facility] Emergency Department Provider Note   ____________________________________________   First MD Initiated Contact with Patient 04/28/19 [phone]     (approximate)  I have reviewed the triage vital signs and the nursing notes.   HISTORY  Chief Complaint Dizziness and Irregular Heart Beat    HPI Glenn Rasmussen is a 28 y.o. male who complains of several days of brief episodes of dizziness with irregular heartbeat.  He has a heart monitoring app on his cell phone and his monitors himself he is having some PVCs or PJCs.  Otherwise looks okay.  He also reports he has been kind of achy all over and tired a lot lately.  He had a history of renal failure and dialysis earlier in the summer.  This is better he is making urine now.  He is drinking a lot of water.  He was let go from his job last week because he is taking too long to get better supposed to start a new job in the morning but he has been here all night long so he will have to start the next day.         Past Medical History:  Diagnosis Date  . Hypertension   . IBS (irritable bowel syndrome)     Patient Active Problem List   Diagnosis Date Noted  . Major depressive disorder, recurrent severe without psychotic features ([place]) 03/30/2019  . Intentional benzodiazepine overdose ([place])   . MDD (major depressive disorder), recurrent severe, without psychosis ([place]) 03/13/2019  . Multiple drug overdose 03/09/2019  . Suicide attempt ([place]) 03/09/2019  . HTN (hypertension) 03/09/2019  . AKI (acute kidney injury) ([place]) 03/09/2019  . Acute respiratory failure ([place])   . Antihypertensive agent overdose   . Calcium channel blocker overdose     Past Surgical History:  Procedure Laterality Date  . APPENDECTOMY    . CHOLECYSTECTOMY    . DIALYSIS/PERMA CATHETER INSERTION N/A 03/26/2019   Procedure: DIALYSIS/PERMA CATHETER INSERTION;  Surgeon: Algernon Huxley, MD;  Location: [place] [facility];  Service:  Cardiovascular;  Laterality: N/A;  . DIALYSIS/PERMA CATHETER REMOVAL N/A 04/03/2019   Procedure: DIALYSIS/PERMA CATHETER REMOVAL;  Surgeon: Algernon Huxley, MD;  Location: [place] [facility];  Service: Cardiovascular;  Laterality: N/A;  . TONSILLECTOMY      Prior to Admission medications   Medication Sig Start Date End Date Taking? Authorizing Provider  aspirin 325 MG EC tablet Take 1 tablet (325 mg total) by mouth daily. 04/03/19   Clapacs, Madie Reno, MD  clonazePAM (KLONOPIN) 0.5 MG tablet Take 1 tablet (0.5 mg total) by mouth daily as needed (anxiety). 04/03/19   Clapacs, Madie Reno, MD  pantoprazole (PROTONIX) 40 MG tablet Take 1 tablet (40 mg total) by mouth at bedtime. 04/03/19   Clapacs, Madie Reno, MD  polyethylene glycol (MIRALAX / GLYCOLAX) 17 g packet Place 17 g into feeding tube daily as needed for mild constipation or moderate constipation. 04/03/19   Clapacs, Madie Reno, MD  traZODone (DESYREL) 100 MG tablet Take 2 tablets (200 mg total) by mouth at bedtime. 04/03/19   Clapacs, Madie Reno, MD  venlafaxine XR (EFFEXOR-XR) 150 MG 24 hr capsule Take 1 capsule (150 mg total) by mouth daily with breakfast. 04/03/19   Clapacs, Madie Reno, MD    Allergies Patient has no known allergies.  Family History  Problem Relation Age of Onset  . Lung cancer Mother   . Lung cancer Father     Social History Social  History   Tobacco Use  . Smoking status: Never Smoker  . Smokeless tobacco: Never Used  Substance Use Topics  . Alcohol use: No    Frequency: Never  . Drug use: No    Review of Systems  Constitutional: No fever/chills Eyes: No visual changes. ENT: No sore throat. Cardiovascular: Denies chest pain. Respiratory: Denies shortness of breath. Gastrointestinal: No abdominal pain.  No nausea, no vomiting.  No diarrhea.  No constipation. Genitourinary: Negative for dysuria. Musculoskeletal: Negative for back pain. Skin: Negative for rash. Neurological: Negative for headaches, focal weakness   ____________________________________________   PHYSICAL EXAM:  VITAL SIGNS: ED Triage Vitals  Enc Vitals Group     BP 04/27/19 2319 140/89     Pulse Rate 04/27/19 2319 (!) 118     Resp 04/27/19 2319 18     Temp 04/27/19 2319 98.9 F (37.2 C)     Temp Source 04/27/19 2319 Oral     SpO2 04/27/19 2319 98 %     Weight 04/27/19 2318 180 lb (81.6 kg)     Height 04/27/19 2318 5\' 7"  (1.702 m)     Head Circumference --      Peak Flow --      Pain Score 04/27/19 2317 7     Pain Loc --      Pain Edu? --      Excl. in [place]? --     Constitutional: Alert and oriented. Well appearing and in no acute distress. Eyes: Conjunctivae are normal.  Head: Atraumatic. Nose: No congestion/rhinnorhea. Mouth/Throat: Mucous membranes are moist.  Oropharynx non-erythematous. Neck: No stridor.   Cardiovascular: Normal rate, regular rhythm. Grossly normal heart sounds.  Good peripheral circulation. Respiratory: Normal respiratory effort.  No retractions. Lungs CTAB. Gastrointestinal: Soft and nontender. No distention. No abdominal bruits. No CVA tenderness. Musculoskeletal: No lower extremity tenderness nor edema. Neurologic:  Normal speech and language. No gross focal neurologic deficits are appreciated. Skin:  Skin is warm, dry and intact. No rash noted.   ____________________________________________   LABS (all labs ordered are listed, but only abnormal results are displayed)  Labs Reviewed  BASIC METABOLIC PANEL - Abnormal; Notable for the following components:      Result Value   Glucose, Bld 112 (*)    All other components within normal limits  CBC - Abnormal; Notable for the following components:   WBC 14.6 (*)    RBC 4.01 (*)    Hemoglobin 10.5 (*)    HCT 32.3 (*)    Platelets 442 (*)    All other components within normal limits  URINALYSIS, COMPLETE (UACMP) WITH MICROSCOPIC - Abnormal; Notable for the following components:   Color, Urine STRAW (*)    APPearance CLEAR (*)    All  other components within normal limits  CK  HEPATIC FUNCTION PANEL  TSH  CBG MONITORING, ED  TROPONIN I (HIGH SENSITIVITY)  TROPONIN I (HIGH SENSITIVITY)   ____________________________________________  EKG  EKG read and interpreted by me shows sinus tachycardia rate of 111 normal axis no acute ST-T changes ____________________________________________  RADIOLOGY  ED MD interpretation:    Official radiology report(s): No results found.  ____________________________________________   PROCEDURES  Procedure(s) performed (including Critical Care):  Procedures   ____________________________________________   INITIAL IMPRESSION / ASSESSMENT AND PLAN / ED COURSE Labs are normal troponin is negative EKG looks like just some tachycardia.  When I examined him later he is not tachycardic any longer.  I looked at his heart monitoring and just  look like there are some PVCs or PJCs.  I have added some more blood work CK TSH etc. but I will not have him stay here for that.  I will have him follow-up with his primary care doctor and cardiology.  Possibly cardiology can put a Holter or something on him to monitor his heart rate.     LATRAVION GRAVES was evaluated in Emergency Department on 04/28/2019 for the symptoms described in the history of present illness. He was evaluated in the context of the global COVID-19 pandemic, which necessitated consideration that the patient might be at risk for infection with the SARS-CoV-2 virus that causes COVID-19. Institutional protocols and algorithms that pertain to the evaluation of patients at risk for COVID-19 are in a state of rapid change based on information released by regulatory bodies including the CDC and federal and state organizations. These policies and algorithms were followed during the patient's care in the ED.         ____________________________________________   FINAL CLINICAL IMPRESSION(S) / ED DIAGNOSES  Final diagnoses:   Dizziness     ED Discharge Orders    None       Note:  This document was prepared using Dragon voice recognition software and may include unintentional dictation errors.    Nena Polio, MD 04/28/19 [phone]

## 2019-07-15 ENCOUNTER — Other Ambulatory Visit: Payer: Self-pay | Admitting: Psychiatry

## 2019-10-30 ENCOUNTER — Encounter: Payer: Self-pay | Admitting: Emergency Medicine

## 2019-10-30 ENCOUNTER — Inpatient Hospital Stay
Admission: EM | Admit: 2019-10-30 | Discharge: 2019-11-02 | DRG: 918 | Disposition: A | Discharge status: Home / Self Care | Payer: Self-pay | Attending: Internal Medicine | Admitting: Internal Medicine

## 2019-10-30 ENCOUNTER — Other Ambulatory Visit: Payer: Self-pay

## 2019-10-30 DIAGNOSIS — F319 Bipolar disorder, unspecified: Secondary | ICD-10-CM | POA: Diagnosis present

## 2019-10-30 DIAGNOSIS — Z20822 Contact with and (suspected) exposure to covid-19: Secondary | ICD-10-CM | POA: Diagnosis present

## 2019-10-30 DIAGNOSIS — R945 Abnormal results of liver function studies: Secondary | ICD-10-CM

## 2019-10-30 DIAGNOSIS — T50911A Poisoning by multiple unspecified drugs, medicaments and biological substances, accidental (unintentional), initial encounter: Secondary | ICD-10-CM

## 2019-10-30 DIAGNOSIS — T50902A Poisoning by unspecified drugs, medicaments and biological substances, intentional self-harm, initial encounter: Secondary | ICD-10-CM

## 2019-10-30 DIAGNOSIS — T450X2A Poisoning by antiallergic and antiemetic drugs, intentional self-harm, initial encounter: Principal | ICD-10-CM | POA: Diagnosis present

## 2019-10-30 DIAGNOSIS — D72829 Elevated white blood cell count, unspecified: Secondary | ICD-10-CM | POA: Diagnosis present

## 2019-10-30 DIAGNOSIS — G4700 Insomnia, unspecified: Secondary | ICD-10-CM | POA: Diagnosis present

## 2019-10-30 DIAGNOSIS — Z801 Family history of malignant neoplasm of trachea, bronchus and lung: Secondary | ICD-10-CM

## 2019-10-30 DIAGNOSIS — N179 Acute kidney failure, unspecified: Secondary | ICD-10-CM | POA: Diagnosis present

## 2019-10-30 DIAGNOSIS — K589 Irritable bowel syndrome without diarrhea: Secondary | ICD-10-CM | POA: Diagnosis present

## 2019-10-30 DIAGNOSIS — G43909 Migraine, unspecified, not intractable, without status migrainosus: Secondary | ICD-10-CM | POA: Diagnosis present

## 2019-10-30 DIAGNOSIS — F419 Anxiety disorder, unspecified: Secondary | ICD-10-CM | POA: Diagnosis present

## 2019-10-30 DIAGNOSIS — K219 Gastro-esophageal reflux disease without esophagitis: Secondary | ICD-10-CM | POA: Diagnosis present

## 2019-10-30 DIAGNOSIS — Z560 Unemployment, unspecified: Secondary | ICD-10-CM

## 2019-10-30 DIAGNOSIS — I10 Essential (primary) hypertension: Secondary | ICD-10-CM

## 2019-10-30 DIAGNOSIS — Z7982 Long term (current) use of aspirin: Secondary | ICD-10-CM

## 2019-10-30 DIAGNOSIS — Z79899 Other long term (current) drug therapy: Secondary | ICD-10-CM

## 2019-10-30 DIAGNOSIS — F332 Major depressive disorder, recurrent severe without psychotic features: Secondary | ICD-10-CM | POA: Diagnosis present

## 2019-10-30 DIAGNOSIS — E876 Hypokalemia: Secondary | ICD-10-CM | POA: Diagnosis present

## 2019-10-30 DIAGNOSIS — R7989 Other specified abnormal findings of blood chemistry: Secondary | ICD-10-CM | POA: Diagnosis present

## 2019-10-30 DIAGNOSIS — R519 Headache, unspecified: Secondary | ICD-10-CM

## 2019-10-30 DIAGNOSIS — Z23 Encounter for immunization: Secondary | ICD-10-CM

## 2019-10-30 DIAGNOSIS — T1491XA Suicide attempt, initial encounter: Secondary | ICD-10-CM | POA: Diagnosis present

## 2019-10-30 DIAGNOSIS — Z9049 Acquired absence of other specified parts of digestive tract: Secondary | ICD-10-CM

## 2019-10-30 HISTORY — DX: Migraine, unspecified, not intractable, without status migrainosus: G43.909

## 2019-10-30 HISTORY — DX: Anxiety disorder, unspecified: F41.9

## 2019-10-30 LAB — ETHANOL: Alcohol, Ethyl (B): <10 mg/dL (ref <10)

## 2019-10-30 LAB — CBC WITH DIFFERENTIAL/PLATELET
Abs Immature Granulocytes: 0.2 10*3/uL — ABNORMAL HIGH (ref 0.00–0.07)
Basophils Absolute: 0.2 10*3/uL — ABNORMAL HIGH (ref 0.0–0.1)
Basophils Relative: 1 %
Eosinophils Absolute: 0 10*3/uL (ref 0.0–0.5)
Eosinophils Relative: 0 %
HCT: 43.2 % (ref 39.0–52.0)
Hemoglobin: 15 g/dL (ref 13.0–17.0)
Immature Granulocytes: 1 %
Lymphocytes Relative: 12 %
Lymphs Abs: 2.5 10*3/uL (ref 0.7–4.0)
MCH: 27 pg (ref 26.0–34.0)
MCHC: 34.7 g/dL (ref 30.0–36.0)
MCV: 77.8 fL — ABNORMAL LOW (ref 80.0–100.0)
Monocytes Absolute: 1.8 10*3/uL — ABNORMAL HIGH (ref 0.1–1.0)
Monocytes Relative: 9 %
Neutro Abs: 15.6 10*3/uL — ABNORMAL HIGH (ref 1.7–7.7)
Neutrophils Relative %: 77 %
Platelets: 462 10*3/uL — ABNORMAL HIGH (ref 150–400)
RBC: 5.55 MIL/uL (ref 4.22–5.81)
RDW: 13.2 % (ref 11.5–15.5)
WBC: 20.2 10*3/uL — ABNORMAL HIGH (ref 4.0–10.5)
nRBC: 0 % (ref 0.0–0.2)

## 2019-10-30 LAB — COMPREHENSIVE METABOLIC PANEL
ALT: 141 U/L — ABNORMAL HIGH (ref 0–44)
AST: 134 U/L — ABNORMAL HIGH (ref 15–41)
Albumin: 5.2 g/dL — ABNORMAL HIGH (ref 3.5–5.0)
Alkaline Phosphatase: 64 U/L (ref 38–126)
Anion gap: 16 — ABNORMAL HIGH (ref 5–15)
BUN: 16 mg/dL (ref 6–20)
CO2: 20 mmol/L — ABNORMAL LOW (ref 22–32)
Calcium: 9.6 mg/dL (ref 8.9–10.3)
Chloride: 104 mmol/L (ref 98–111)
Creatinine, Ser: 1.64 mg/dL — ABNORMAL HIGH (ref 0.61–1.24)
GFR calc Af Amer: >60 mL/min (ref >60)
GFR calc non Af Amer: 55 mL/min — ABNORMAL LOW (ref >60)
Glucose, Bld: 161 mg/dL — ABNORMAL HIGH (ref 70–99)
Potassium: 3.4 mmol/L — ABNORMAL LOW (ref 3.5–5.1)
Sodium: 140 mmol/L (ref 135–145)
Total Bilirubin: 1.2 mg/dL (ref 0.3–1.2)
Total Protein: 8.8 g/dL — ABNORMAL HIGH (ref 6.5–8.1)

## 2019-10-30 LAB — URINE DRUG SCREEN, QUALITATIVE (ARMC ONLY)
Amphetamines, Ur Screen: NOT DETECTED
Barbiturates, Ur Screen: NOT DETECTED
Benzodiazepine, Ur Scrn: NOT DETECTED
Cannabinoid 50 Ng, Ur ~~LOC~~: NOT DETECTED
Cocaine Metabolite,Ur ~~LOC~~: NOT DETECTED
MDMA (Ecstasy)Ur Screen: NOT DETECTED
Methadone Scn, Ur: NOT DETECTED
Opiate, Ur Screen: NOT DETECTED
Phencyclidine (PCP) Ur S: NOT DETECTED
Tricyclic, Ur Screen: POSITIVE — AB

## 2019-10-30 LAB — PROTIME-INR
INR: 1 (ref 0.8–1.2)
Prothrombin Time: 13 seconds (ref 11.4–15.2)

## 2019-10-30 LAB — MAGNESIUM: Magnesium: 2 mg/dL (ref 1.7–2.4)

## 2019-10-30 LAB — RESPIRATORY PANEL BY RT PCR (FLU A&B, COVID)
Influenza A by PCR: NEGATIVE
Influenza B by PCR: NEGATIVE
SARS Coronavirus 2 by RT PCR: NEGATIVE

## 2019-10-30 LAB — LIPASE, BLOOD: Lipase: 21 U/L (ref 11–51)

## 2019-10-30 LAB — SALICYLATE LEVEL: Salicylate Lvl: <7 mg/dL — ABNORMAL LOW (ref 7.0–30.0)

## 2019-10-30 LAB — HIV ANTIBODY (ROUTINE TESTING W REFLEX): HIV Screen 4th Generation wRfx: NONREACTIVE

## 2019-10-30 LAB — ACETAMINOPHEN LEVEL
Acetaminophen (Tylenol), Serum: 50 ug/mL — ABNORMAL HIGH (ref 10–30)
Acetaminophen (Tylenol), Serum: 15 ug/mL (ref 10–30)

## 2019-10-30 MED ORDER — HEPARIN SODIUM (PORCINE) 5000 UNIT/ML IJ SOLN
5000.0000 [IU] | Freq: Three times a day (TID) | INTRAMUSCULAR | Status: DC
  Administered 2019-10-30 – 2019-11-02 (×11): 5000 [IU] via SUBCUTANEOUS
  Filled 2019-10-30 (×11): qty 1

## 2019-10-30 MED ORDER — SUMATRIPTAN SUCCINATE 50 MG PO TABS
50.0000 mg | ORAL_TABLET | ORAL | Status: DC | PRN
  Administered 2019-11-01 (×2): 50 mg via ORAL
  Filled 2019-10-30 (×6): qty 1

## 2019-10-30 MED ORDER — OXYCODONE HCL 5 MG PO TABS
5.0000 mg | ORAL_TABLET | Freq: Four times a day (QID) | ORAL | Status: DC | PRN
  Administered 2019-10-30 – 2019-11-01 (×6): 5 mg via ORAL
  Filled 2019-10-30 (×6): qty 1

## 2019-10-30 MED ORDER — ACETYLCYSTEINE LOAD VIA INFUSION
150.0000 mg/kg | Freq: Once | INTRAVENOUS | Status: AC
  Administered 2019-10-30: 13605 mg via INTRAVENOUS
  Filled 2019-10-30: qty 341

## 2019-10-30 MED ORDER — ONDANSETRON HCL 4 MG/2ML IJ SOLN
4.0000 mg | Freq: Three times a day (TID) | INTRAMUSCULAR | Status: DC | PRN
  Administered 2019-10-30 – 2019-11-01 (×3): 4 mg via INTRAVENOUS
  Filled 2019-10-30 (×3): qty 2

## 2019-10-30 MED ORDER — SODIUM CHLORIDE 0.9 % IV SOLN: INTRAVENOUS | Status: DC

## 2019-10-30 MED ORDER — ONDANSETRON HCL 4 MG/2ML IJ SOLN
4.0000 mg | Freq: Once | INTRAMUSCULAR | Status: AC
  Administered 2019-10-30: 4 mg via INTRAVENOUS
  Filled 2019-10-30: qty 2

## 2019-10-30 MED ORDER — ACETYLCYSTEINE LOAD VIA INFUSION
150.0000 mg/kg | Freq: Once | INTRAVENOUS | Status: DC
  Filled 2019-10-30: qty 341

## 2019-10-30 MED ORDER — PANTOPRAZOLE SODIUM 40 MG PO TBEC
40.0000 mg | DELAYED_RELEASE_TABLET | Freq: Every day | ORAL | Status: DC
  Administered 2019-10-30 – 2019-11-02 (×4): 40 mg via ORAL
  Filled 2019-10-30 (×4): qty 1

## 2019-10-30 MED ORDER — DEXTROSE 5 % IV SOLN
15.0000 mg/kg/h | INTRAVENOUS | Status: DC
  Administered 2019-10-30 – 2019-10-31 (×2): 15 mg/kg/h via INTRAVENOUS
  Filled 2019-10-30 (×4): qty 120

## 2019-10-30 MED ORDER — DIAZEPAM 5 MG PO TABS
5.0000 mg | ORAL_TABLET | Freq: Four times a day (QID) | ORAL | Status: DC | PRN
  Administered 2019-10-30 – 2019-11-02 (×5): 5 mg via ORAL
  Filled 2019-10-30 (×5): qty 1

## 2019-10-30 MED ORDER — POTASSIUM CHLORIDE CRYS ER 20 MEQ PO TBCR
40.0000 meq | EXTENDED_RELEASE_TABLET | Freq: Once | ORAL | Status: AC
  Administered 2019-10-30: 08:00:00 40 meq via ORAL
  Filled 2019-10-30: qty 2

## 2019-10-30 MED ORDER — HYDRALAZINE HCL 20 MG/ML IJ SOLN
5.0000 mg | INTRAMUSCULAR | Status: DC | PRN
  Administered 2019-10-30: 11:00:00 5 mg via INTRAVENOUS
  Filled 2019-10-30 (×3): qty 1

## 2019-10-30 MED ORDER — SODIUM BICARBONATE 8.4 % IV SOLN
INTRAVENOUS | Status: DC
  Filled 2019-10-30 (×5): qty 150

## 2019-10-30 MED ORDER — STERILE WATER FOR INJECTION IV SOLN: INTRAVENOUS | Status: DC

## 2019-10-30 MED ORDER — SODIUM CHLORIDE 0.9 % IV BOLUS
1000.0000 mL | Freq: Once | INTRAVENOUS | Status: AC
  Administered 2019-10-30: 03:00:00 1000 mL via INTRAVENOUS

## 2019-10-30 MED ORDER — SODIUM BICARBONATE 8.4 % IV SOLN: INTRAVENOUS | Status: DC

## 2019-10-30 MED ORDER — SODIUM CHLORIDE 0.9 % IV BOLUS
1000.0000 mL | Freq: Once | INTRAVENOUS | Status: AC
  Administered 2019-10-30: 05:00:00 1000 mL via INTRAVENOUS

## 2019-10-30 NOTE — ED Notes (Signed)
Pt c/o nausea despite getting zofran around 12:00, Dr. Blaine Hamper informed. Verbal given to administer 4mg  zofran IV now.

## 2019-10-30 NOTE — ED Notes (Signed)
Pt given cola 

## 2019-10-30 NOTE — ED Notes (Signed)
This RN on the phone with poison control. Concern for ingestion other than Tylenol level and losartan.   Recommendation: Watch for possible seizures and EKG changes with a 18-24hr risk window .   Order: Serial EKGs Q6 hrs, Mg level, Acetylcysteine PO  Gina: [phone]  MD made aware. RN faxed recommendations.

## 2019-10-30 NOTE — BH Assessment (Signed)
Tele Assessment Note   Patient Name: Glenn Rasmussen MRN: [id number] Referring Physician: Blaine Hamper Location of Patient: [facility] ED Location of Provider: [place] is an 29 y.o. male presenting voluntarily to [facility] ED via EMS after an intentional overdose. Patient reports to EDP ingesting 60 100 mg losartan at 1930 on 1/21. Patient states his mother came home to find him acting strangely and vomiting. At that point he admitted the overdose to mother and she contacted EMS. Patient states he has a history of anxiety and perhaps bipolar disorder. Patient reports severe depressive episodes and describes some hypomania at times. He states today he has a court date and he panicked, triggering the overdose. He denies HI/AVH. Patient reports 1 prior suicide attempt by overdose in June 2020, after which he was hospitalized at [facility]. Patient indicated he did not follow up with outpatient resources due to financial concerns. Patient denies any history of substance abuse or trauma.   Patient is alert and oriented x 4. He is dressed in a hospital gown, sitting upright in bed. His speech is coherent, eye contact is good, and thought process is logical. His mood is anxious and his affect is congruent. He has fair insight, but poor judgement and impulse control. He does not appear to be responding to internal stimuli or experiencing delusional thought content.  Diagnosis: F31.81 Bipolar II disorder  Past Medical History:  Past Medical History:  Diagnosis Date  . Anxiety   . Hypertension   . IBS (irritable bowel syndrome)   . Migraines     Past Surgical History:  Procedure Laterality Date  . APPENDECTOMY    . CHOLECYSTECTOMY    . DIALYSIS/PERMA CATHETER INSERTION N/A 03/26/2019   Procedure: DIALYSIS/PERMA CATHETER INSERTION;  Surgeon: Algernon Huxley, MD;  Location: [place] [facility];  Service: Cardiovascular;  Laterality: N/A;  . DIALYSIS/PERMA CATHETER REMOVAL N/A  04/03/2019   Procedure: DIALYSIS/PERMA CATHETER REMOVAL;  Surgeon: Algernon Huxley, MD;  Location: [place] [facility];  Service: Cardiovascular;  Laterality: N/A;  . TONSILLECTOMY      Family History:  Family History  Problem Relation Age of Onset  . Lung cancer Mother   . Lung cancer Father     Social History:  reports that he has never smoked. He has never used smokeless tobacco. He reports that he does not drink alcohol or use drugs.  Additional Social History:  Alcohol / Drug Use Pain Medications: see MAR Prescriptions: see MAR Over the Counter: see MAR History of alcohol / drug use?: No history of alcohol / drug abuse  CIWA: CIWA-Ar BP: (!) 203/128 Pulse Rate: (!) 120 COWS:    Allergies: No Known Allergies  Home Medications: (Not in a hospital admission)   OB/GYN Status:  No LMP for male patient.  General Assessment Data Location of Assessment: [facility] ED TTS Assessment: In system Is this a Tele or Face-to-Face Assessment?: Tele Assessment Is this an Initial Assessment or a Re-assessment for this encounter?: Initial Assessment Patient Accompanied by:: N/A Language Other than English: No Living Arrangements: (with mother) What gender do you identify as?: Male Marital status: Single Maiden name: Strain Pregnancy Status: No Living Arrangements: Parent Can pt return to current living arrangement?: Yes Admission Status: Voluntary Is patient capable of signing voluntary admission?: Yes Referral Source: Self/Family/Friend Insurance type: BCBS     Crisis Care Plan Living Arrangements: Parent Legal Guardian: (self) Name of Psychiatrist: none Name of Therapist: none  Education Status Is  patient currently in school?: No Is the patient employed, unemployed or receiving disability?: Unemployed  Risk to self with the past 6 months Suicidal Ideation: Yes-Currently Present Has patient been a risk to self within the past 6 months prior to admission? : Yes Suicidal  Intent: Yes-Currently Present Has patient had any suicidal intent within the past 6 months prior to admission? : Yes Is patient at risk for suicide?: Yes Suicidal Plan?: Yes-Currently Present Has patient had any suicidal plan within the past 6 months prior to admission? : Yes Specify Current Suicidal Plan: overdose Access to Means: Yes Specify Access to Suicidal Means: access to medicine What has been your use of drugs/alcohol within the last 12 months?: denies Previous Attempts/Gestures: Yes How many times?: 1 Other Self Harm Risks: none noted Triggers for Past Attempts: (court, work stress) Intentional Self Injurious Behavior: None Family Suicide History: No Recent stressful life event(s): Legal Issues Persecutory voices/beliefs?: No Depression: Yes Depression Symptoms: Despondent, Insomnia, Tearfulness, Isolating, Fatigue, Guilt, Loss of interest in usual pleasures, Feeling worthless/self pity, Feeling angry/irritable Substance abuse history and/or treatment for substance abuse?: No Suicide prevention information given to non-admitted patients: Not applicable  Risk to Others within the past 6 months Homicidal Ideation: No Does patient have any lifetime risk of violence toward others beyond the six months prior to admission? : No Thoughts of Harm to Others: No Current Homicidal Intent: No Current Homicidal Plan: No Access to Homicidal Means: No Identified Victim: none History of harm to others?: No Assessment of Violence: None Noted Violent Behavior Description: none Does patient have access to weapons?: No Criminal Charges Pending?: Yes Describe Pending Criminal Charges: larceny Does patient have a court date: No Is patient on probation?: No  Psychosis Hallucinations: None noted Delusions: None noted  Mental Status Report Appearance/Hygiene: In scrubs Eye Contact: Good Motor Activity: Freedom of movement Speech: Logical/coherent Level of Consciousness: Alert Mood:  Anxious Affect: Anxious Anxiety Level: Moderate Thought Processes: Coherent, Relevant Judgement: Impaired Orientation: Person, Place, Time, Situation Obsessive Compulsive Thoughts/Behaviors: None  Cognitive Functioning Concentration: Normal Memory: Recent Intact, Remote Intact Is patient IDD: No Insight: Fair Impulse Control: Fair Appetite: Fair Have you had any weight changes? : No Change Sleep: Decreased Total Hours of Sleep: (UTA) Vegetative Symptoms: None  ADLScreening [facility] Assessment Services) Patient's cognitive ability adequate to safely complete daily activities?: Yes Patient able to express need for assistance with ADLs?: Yes Independently performs ADLs?: Yes (appropriate for developmental age)  Prior Inpatient Therapy Prior Inpatient Therapy: Yes Prior Therapy Dates: 2020 Prior Therapy Facilty/Provider(s): [facility] Reason for Treatment: suicide attempt  Prior Outpatient Therapy Prior Outpatient Therapy: Yes Prior Therapy Dates: (UTA) Prior Therapy Facilty/Provider(s): (UTA) Reason for Treatment: depression Does patient have an ACCT team?: No Does patient have Intensive In-House Services?  : No Does patient have [facility] services? : No Does patient have P4CC services?: No  ADL Screening (condition at time of admission) Patient's cognitive ability adequate to safely complete daily activities?: Yes Is the patient deaf or have difficulty hearing?: No Does the patient have difficulty seeing, even when wearing glasses/contacts?: No Does the patient have difficulty concentrating, remembering, or making decisions?: No Patient able to express need for assistance with ADLs?: Yes Does the patient have difficulty dressing or bathing?: No Independently performs ADLs?: Yes (appropriate for developmental age) Does the patient have difficulty walking or climbing stairs?: No Weakness of Legs: None Weakness of Arms/Hands: None  Home Assistive Devices/Equipment Home Assistive  Devices/Equipment: None  Therapy Consults (therapy consults require a  physician order) PT Evaluation Needed: No OT Evalulation Needed: No SLP Evaluation Needed: No Abuse/Neglect Assessment (Assessment to be complete while patient is alone) Abuse/Neglect Assessment Can Be Completed: Yes Physical Abuse: Denies Verbal Abuse: Denies Sexual Abuse: Denies Exploitation of patient/patient's resources: Denies Self-Neglect: Denies Values / Beliefs Cultural Requests During Hospitalization: None Spiritual Requests During Hospitalization: None Consults Spiritual Care Consult Needed: No Transition of Care Team Consult Needed: No Advance Directives (For Healthcare) Does Patient Have a Medical Advance Directive?: No Would patient like information on creating a medical advance directive?: No - Patient declined          Disposition: Dr. Sheliah Mends recommends in patient treatment once medically cleared. Disposition Initial Assessment Completed for this Encounter: Yes  This service was provided via telemedicine using a 2-way, interactive audio and video technology.  Names of all persons participating in this telemedicine service and their role in this encounter. Name: Glenn Rasmussen Role: patient  Name: Dr. Marlana Salvage Role: MD  Name: Orvis Brill, LCSW Role: TTS  Name:  Role:     Orvis Brill 10/30/2019 10:44 AM

## 2019-10-30 NOTE — ED Notes (Signed)
  Spoke to [place] at [employer] at number:  [phone]  Gave her an update.  She states patient should have repeat EKGs every 6 hours and MD should work to get patient's potassium level as greater than 4.

## 2019-10-30 NOTE — ED Notes (Signed)
Attempted to call report to ICU, secretary states charge RN is off the floor and will call back once she returns

## 2019-10-30 NOTE — BHH Counselor (Signed)
Contacted [facility] to set up tele-psych @ 0950.

## 2019-10-30 NOTE — ED Notes (Addendum)
Pt given ice water and new ID band due to previous band being tight on wrist. Sitter remains at bedside

## 2019-10-30 NOTE — Consult Note (Signed)
MEDICATION RELATED CONSULT NOTE - FOLLOW UP   Pharmacy Consult for Acetylcysteine PO/IV pharmacy consult Indication: Pharmacist assistance with acetaminophen overdose treatment plan including recommendations from the [facility].  No Known Allergies  Patient Measurements: Height: 5\' 7"  (170.2 cm) Weight: 200 lb (90.7 kg) IBW/kg (Calculated) : 66.1  Vital Signs: Temp: 99.6 F (37.6 C) (01/22 0322) Temp Source: Oral (01/22 0322) BP: 158/109 (01/22 1400) Pulse Rate: 107 (01/22 1400) Intake/Output from previous day: 01/21 0701 - 01/22 0700 In: 2000 [IV Piggyback:2000] Out: -  Intake/Output from this shift: Total I/O In: 400 [I.V.:400] Out: -   Labs: Recent Labs    10/30/19 0327  WBC 20.2*  HGB 15.0  HCT 43.2  PLT 462*  CREATININE 1.64*  MG 2.0  ALBUMIN 5.2*  PROT 8.8*  AST 134*  ALT 141*  ALKPHOS 64  BILITOT 1.2   Estimated Creatinine Clearance: 70.7 mL/min (A) (by C-G formula based on SCr of 1.64 mg/dL (H)).   Microbiology: Recent Results (from the past 720 hour(s))  Respiratory Panel by RT PCR (Flu A&B, Covid) - Nasopharyngeal Swab     Status: None   Collection Time: 10/30/19  6:46 AM   Specimen: Nasopharyngeal Swab  Result Value Ref Range Status   SARS Coronavirus 2 by RT PCR NEGATIVE NEGATIVE Final    Comment: (NOTE) SARS-CoV-2 target nucleic acids are NOT DETECTED. The SARS-CoV-2 RNA is generally detectable in upper respiratoy specimens during the acute phase of infection. The lowest concentration of SARS-CoV-2 viral copies this assay can detect is 131 copies/mL. A negative result does not preclude SARS-Cov-2 infection and should not be used as the sole basis for treatment or other patient management decisions. A negative result may occur with  improper specimen collection/handling, submission of specimen other than nasopharyngeal swab, presence of viral mutation(s) within the areas targeted by this assay, and inadequate number of viral  copies (<131 copies/mL). A negative result must be combined with clinical observations, patient history, and epidemiological information. The expected result is Negative. Fact Sheet for Patients:  PinkCheek.be Fact Sheet for Healthcare Providers:  GravelBags.it This test is not yet ap proved or cleared by the [place] FDA and  has been authorized for detection and/or diagnosis of SARS-CoV-2 by FDA under an Emergency Use Authorization (EUA). This EUA will remain  in effect (meaning this test can be used) for the duration of the COVID-19 declaration under Section 564(b)(1) of the Act, 21 U.S.C. section 360bbb-3(b)(1), unless the authorization is terminated or revoked sooner.    Influenza A by PCR NEGATIVE NEGATIVE Final   Influenza B by PCR NEGATIVE NEGATIVE Final    Comment: (NOTE) The Xpert Xpress SARS-CoV-2/FLU/RSV assay is intended as an aid in  the diagnosis of influenza from Nasopharyngeal swab specimens and  should not be used as a sole basis for treatment. Nasal washings and  aspirates are unacceptable for Xpert Xpress SARS-CoV-2/FLU/RSV  testing. Fact Sheet for Patients: PinkCheek.be Fact Sheet for Healthcare Providers: GravelBags.it This test is not yet approved or cleared by the [place] FDA and  has been authorized for detection and/or diagnosis of SARS-CoV-2 by  FDA under an Emergency Use Authorization (EUA). This EUA will remain  in effect (meaning this test can be used) for the duration of the  Covid-19 declaration under Section 564(b)(1) of the Act, 21  U.S.C. section 360bbb-3(b)(1), unless the authorization is  terminated or revoked. Performed at [facility], [address], [place],  [address]  Medications:  Pt home medications include asa 325mg , clonozepam, pant   Assessment: Glenn Rasmussen is a 29 y.o. male  with a history of anxiety, hypertension, IBS, depression, migraines who presents after a suicide attempt.   Patient reports taking 60 x 100 mg losartan at 7:30 PM 1/21 and has a detectable tylenol level 50ug/ml @ 0327.  Pt is unreliable historian and did not initially report tylenol ingestion.  Plan:  Confirmed choice of IV acetylcysteine with provider per pt nausea/npo status.  Will give 150mg /kg loading dose, followed by 15mg /kg/hr.  Pharmacy will follow per protocol  -  CMET w/ AST/ALT, INR, APAP level ordered for 22 hours post start of infusion.    Noralee Space, PharmD Clinical Pharmacist 10/30/2019 2:53 PM

## 2019-10-30 NOTE — ED Notes (Signed)
Pt ambulatory to bathroom w/ stand by assistance. Pt still unable to pee. MD made aware.

## 2019-10-30 NOTE — Consult Note (Addendum)
Name: Glenn Rasmussen MRN: [id number] DOB: 09-Jul-1991    ADMISSION DATE:  10/30/2019 CONSULTATION DATE: 10/30/2019  REFERRING MD : Dr. Blaine Hamper  CHIEF COMPLAINT: Intentional Drug Overdose   BRIEF PATIENT DESCRIPTION:  29 yo male admitted with intentional drug overdose in an attempt to commit suicide   SIGNIFICANT EVENTS/STUDIES:  01/22: Pt admitted to the stepdown unit with intentional drug overdose   HISTORY OF PRESENT ILLNESS:   This is a 29 yo male with a PMH of Migraines, IBS, HTN, Suicide Attempt, Mechanical Intubation, and Anxiety.  He presented to [facility] ER on 01/22 following intentional drug overdose in a suicide attempt at [place] on 01/21.  Per ER notes pt ingested sixty 100 mg tablets of losartan.  According to the pt he had a scheduled court hearing on 10/30/2019, which has caused worsening anxiety/stress prompting suicide attempt. In the ER lab results revealed K+ 3.4, CO2 20, glucose 161, creatinine 1.64, AST 134, ALT 141, wbc 20.2, tylenol level 50, salicylate [id number], and urine drug positive for tricyclic. Influenza PCR and COVID-19 negative. Initial EKG revealed QT/Qtc 302/425 ms.  Acetylcysteine and sodium bicarb gtts initiated.  However, pt later developed QTc prolongation requiring admission to the stepdown unit and pt involuntarily committed.  PCCM consulted to assist with management.   PAST MEDICAL HISTORY :   has a past medical history of Anxiety, Hypertension, IBS (irritable bowel syndrome), and Migraines.  has a past surgical history that includes Tonsillectomy; Cholecystectomy; Appendectomy; DIALYSIS/PERMA CATHETER INSERTION (N/A, 03/26/2019); and DIALYSIS/PERMA CATHETER REMOVAL (N/A, 04/03/2019). Prior to Admission medications   Medication Sig Start Date End Date Taking? Authorizing Provider  aspirin 325 MG EC tablet Take 1 tablet (325 mg total) by mouth daily. 04/03/19  Yes Clapacs, Madie Reno, MD  losartan (COZAAR) 100 MG tablet Take 100 mg by mouth daily. 08/02/19  Yes [provider]  Multiple Vitamin (MULTIVITAMIN WITH MINERALS) TABS tablet Take 1 tablet by mouth once a week.   Yes [provider]  traZODone (DESYREL) 100 MG tablet Take 2 tablets (200 mg total) by mouth at bedtime. 04/03/19  Yes Clapacs, Madie Reno, MD  venlafaxine XR (EFFEXOR-XR) 150 MG 24 hr capsule TAKE ONE CAPSULE BY MOUTH DAILY WITH BREAKFAST 07/27/19  Yes Clapacs, Madie Reno, MD   No Known Allergies  FAMILY HISTORY:  family history includes Lung cancer in his father and mother. SOCIAL HISTORY:  reports that he has never smoked. He has never used smokeless tobacco. He reports that he does not drink alcohol or use drugs.  REVIEW OF SYSTEMS: Positives in BOLD  Constitutional: Negative for fever, chills, weight loss, malaise/fatigue and diaphoresis.  HENT: Negative for hearing loss, ear pain, nosebleeds, congestion, sore throat, neck pain, tinnitus and ear discharge.   Eyes: Negative for blurred vision, double vision, photophobia, pain, discharge and redness.  Respiratory: Negative for cough, hemoptysis, sputum production, shortness of breath, wheezing and stridor.   Cardiovascular: Negative for chest pain, palpitations, orthopnea, claudication, leg swelling and PND.  Gastrointestinal: Negative for heartburn, nausea, vomiting, abdominal pain, diarrhea, constipation, blood in stool and melena.  Genitourinary: Negative for dysuria, urgency, frequency, hematuria and flank pain.  Musculoskeletal: Negative for myalgias, back pain, joint pain and falls.  Skin: Negative for itching and rash.  Neurological: Negative for dizziness, tingling, tremors, sensory change, speech change, focal weakness, seizures, loss of consciousness, weakness and headaches.  Endo/Heme/Allergies: Negative for environmental allergies and polydipsia. Does not bruise/bleed easily. Psychiatry: suicide attempt, depression, and anxiety   SUBJECTIVE:  No  complaints; denies homicidal and suicidal ideation at this  time  VITAL SIGNS: Temp:  [99.6 F (37.6 C)] 99.6 F (37.6 C) (01/22 0322) Pulse Rate:  [93-131] 96 (01/22 2100) Resp:  [15-39] 22 (01/22 2100) BP: (128-203)/(85-128) 139/104 (01/22 2100) SpO2:  [89 %-98 %] 96 % (01/22 2100) Weight:  [90.7 kg] 90.7 kg (01/22 0324)  PHYSICAL EXAMINATION: General: well developed, well nourished male, NAD  Neuro: alert and oriented, follows commands  HEENT: supple, no JVD  Cardiovascular: sinus rhythm, rrr, no R/G Lungs: clear throughout, even, non labored  Abdomen: +BS x4, soft, obese, non distended, non tender  Musculoskeletal: normal bulk and tone, no edema  Skin: intact no rashes or lesions present   Recent Labs  Lab 10/30/19 0327  NA 140  K 3.4*  CL 104  CO2 20*  BUN 16  CREATININE 1.64*  GLUCOSE 161*   Recent Labs  Lab 10/30/19 0327  HGB 15.0  HCT 43.2  WBC 20.2*  PLT 462*   No results found.  ASSESSMENT / PLAN:  Intentional drug overdose/suicide attempt  (reportd ingesting sixty 100 mg losartan tabs) Hx: Anxiety, major depressive disorder, bipolar 1 disorder, and previous suicide attempt  Prn Supplemental O2 for dyspnea and/or hypoxia-pt currently protecting airway  Continue acetylcysteine gtt and sodium for now  Trend acetaminophen level  Pt IVC for now continue 1:1 sitter and suicide precautions  Psychiatry consulted appreciate input-pt will need inpatient psychiatric admission once medically cleared; will defer when to resume antipsychotics to psychiatry  Prn diazepam for anxiety   Hypertension Prolonged QT/QTc secondary to drug overdose (reportd ingesting sixty 100 mg losartan tabs) Continuous telemetry monitoring  EKG's q6hrs for now  Hold outpatient losartan Maintain map >65 Prn hydralazine for bp management   Elevated liver enzymes  Acute hepatitis panel pending  Trend hepatic panel   Best Practice: VTE px: subq heparin  SUP px: po protonix  Diet: heart healthy   Marda Stalker, [place] Pager [phone] (please enter 7 [phone]) PCCM Consult Pager [phone] (please enter 7 digits)

## 2019-10-30 NOTE — ED Notes (Signed)
Mom, Mickel Baas, called to check on pt's status, advised I would call her back for update. When calling mom back this RN had to leave a message

## 2019-10-30 NOTE — ED Notes (Signed)
Pt's mother Mickel Baas updated on pt's status

## 2019-10-30 NOTE — Consult Note (Signed)
[facility] Face-to-Face Psychiatry Consult   Reason for Consult: Intentional overdose Referring Physician: Dr. Blaine Hamper Patient Identification: Glenn Rasmussen MRN:  [id number] Principal Diagnosis: Multiple drug overdose Diagnosis:  Principal Problem:   Multiple drug overdose Active Problems:   Suicide attempt ([name])   HTN (hypertension)   AKI (acute kidney injury) ([place])   MDD (major depressive disorder), recurrent severe, without psychosis ([place])   Hypokalemia   Leukocytosis   Abnormal LFTs   Migraine headache   GERD (gastroesophageal reflux disease)   Bipolar 1 disorder ([place])   Total Time spent with patient: 45 minutes  Subjective:   Glenn Rasmussen is a 29 y.o. male patient who presents after an intentional overdose.Marland Kitchen  HPI:    Patient reports that last night approximately 7:30 PM he took a suicide attempt by taking an overdose of the remainder of his blood pressure medication that was in the bottle.  Per reports approximately 60 tablets of losartan.  Patient states at the time of doing this he felt like he wanted to die.  Today patient is stating that he notes it was a foolish act, but he felt overwhelmed in the moment and did not of panic.  He is not refusing any care and he is cooperating with medical staff.  Patient goes on to state that he has had a rough year due to some charges that were pressed against him back in May 2020.  Patient states that he is this an elderly woman who lives approximately 10 to 15 minutes away from him that he has become friends with over the years.  Patient states that he often help her pain her house to do chores or manage her finances.  In May 2020 patient was accused of taking funds from the elderly woman's bank account and putting them into his home.  Patient states that he did this as instructed by her and feels that perhaps she has dementia and is now forgetting that she allocated those funds to him.  Patient states that he did not mean to have any of this  happen but it is completely upturned his life.  He states that since the charges were pressed against him he has lost his job.  Patient states that last night all this was on his mind because they have yet is up coming court date today.  Patient states that around 730 felt overwhelmed and took the overdose as a way to end his life to avoid dealing with court case.  Patient maintains understands and states that he wishes the whole thing will be over with as soon as possible.    Past Psychiatric History: Patient has 1 prior suicide attempt in May 2020 where he also swallowed an overdose of pills.  Patient states he did this because he felt overwhelmed by his situation.  He reports having spent 1 month in the psychiatric unit due to some medical complications and having been discharged with an outpatient appointment however patient did not follow-up with the outpatient appointment and only took medications for approximately a month after leaving the psychiatric hospital.  Patient reports finances as a barrier to obtaining care  He denies any major psychiatric issues prior to May 2020.  He was prescribed anxiety medication by his PCP Dr. Including clonazepam and Effexor.  Trauma history: Patient denies physical emotional or sexual abuse as a child.  Reports that his mom gave him to his grandparents to raise due to her drug use.  Patient's grandparents died when he  was 49 and 55 years old leaving him to fend for himself.  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  Risk to Self: Suicidal Ideation: Yes-Currently Present Suicidal Intent: Yes-Currently Present Is patient at risk for suicide?: Yes Suicidal Plan?: Yes-Currently Present Specify Current Suicidal Plan: overdose Access to Means: Yes Specify Access to Suicidal Means: access to medicine What has been your use of drugs/alcohol within the last 12 months?: denies How many times?: 1 Other Self Harm  Risks: none noted Triggers for Past Attempts: (court, work stress) Intentional Self Injurious Behavior: None Risk to Others: Homicidal Ideation: No Thoughts of Harm to Others: No Current Homicidal Intent: No Current Homicidal Plan: No Access to Homicidal Means: No Identified Victim: none History of harm to others?: No Assessment of Violence: None Noted Violent Behavior Description: none Does patient have access to weapons?: No Criminal Charges Pending?: Yes Describe Pending Criminal Charges: larceny Does patient have a court date: No Prior Inpatient Therapy: Prior Inpatient Therapy: Yes Prior Therapy Dates: 2020 Prior Therapy Facilty/Provider(s): [place] Reason for Treatment: suicide attempt Prior Outpatient Therapy: Prior Outpatient Therapy: Yes Prior Therapy Dates: (UTA) Prior Therapy Facilty/Provider(s): (UTA) Reason for Treatment: depression Does patient have an ACCT team?: No Does patient have Intensive In-House Services?  : No Does patient have [facility] services? : No Does patient have P4CC services?: No  Past Medical History:  Past Medical History:  Diagnosis Date  . Anxiety   . Hypertension   . IBS (irritable bowel syndrome)   . Migraines     Past Surgical History:  Procedure Laterality Date  . APPENDECTOMY    . CHOLECYSTECTOMY    . DIALYSIS/PERMA CATHETER INSERTION N/A 03/26/2019   Procedure: DIALYSIS/PERMA CATHETER INSERTION;  Surgeon: Algernon Huxley, MD;  Location: [facility];  Service: Cardiovascular;  Laterality: N/A;  . DIALYSIS/PERMA CATHETER REMOVAL N/A 04/03/2019   Procedure: DIALYSIS/PERMA CATHETER REMOVAL;  Surgeon: Algernon Huxley, MD;  Location: [facility];  Service: Cardiovascular;  Laterality: N/A;  . TONSILLECTOMY     Family History:  Family History  Problem Relation Age of Onset  . Lung cancer Mother   . Lung cancer Father    Family Psychiatric  History: Mother has substance abuse issues Social History:  Social History    Substance and Sexual Activity  Alcohol Use No     Social History   Substance and Sexual Activity  Drug Use No    Social History   Socioeconomic History  . Marital status: Single    Spouse name: Not on file  . Number of children: Not on file  . Years of education: Not on file  . Highest education level: Not on file  Occupational History  . Not on file  Tobacco Use  . Smoking status: Never Smoker  . Smokeless tobacco: Never Used  Substance and Sexual Activity  . Alcohol use: No  . Drug use: No  . Sexual activity: Not on file  Other Topics Concern  . Not on file  Social History Narrative  . Not on file   Social Determinants of Health   Financial Resource Strain:   . Difficulty of Paying Living Expenses: Not on file  Food Insecurity:   . Worried About Charity fundraiser in the Last Year: Not on file  . Ran Out of Food in the Last Year: Not on file  Transportation Needs:   . Lack of Transportation (Medical): Not on file  .  Lack of Transportation (Non-Medical): Not on file  Physical Activity:   . Days of Exercise per Week: Not on file  . Minutes of Exercise per Session: Not on file  Stress:   . Feeling of Stress : Not on file  Social Connections:   . Frequency of Communication with Friends and Family: Not on file  . Frequency of Social Gatherings with Friends and Family: Not on file  . Attends Religious Services: Not on file  . Active Member of Clubs or Organizations: Not on file  . Attends Archivist Meetings: Not on file  . Marital Status: Not on file   Additional Social History: Currently living with his mother in apartment in [place]    Allergies:  No Known Allergies  Labs:  Results for orders placed or performed during the hospital encounter of 10/30/19 (from the past 48 hour(s))  CBC with Differential/Platelet     Status: Abnormal   Collection Time: 10/30/19  3:27 AM  Result Value Ref Range   WBC 20.2 (H) 4.0 - 10.5 K/uL   RBC 5.55 4.22 -  5.81 MIL/uL   Hemoglobin 15.0 13.0 - 17.0 g/dL   HCT 43.2 39.0 - 52.0 %   MCV 77.8 (L) 80.0 - 100.0 fL   MCH 27.0 26.0 - 34.0 pg   MCHC 34.7 30.0 - 36.0 g/dL   RDW 13.2 11.5 - 15.5 %   Platelets 462 (H) 150 - 400 K/uL   nRBC 0.0 0.0 - 0.2 %   Neutrophils Relative % 77 %   Neutro Abs 15.6 (H) 1.7 - 7.7 K/uL   Lymphocytes Relative 12 %   Lymphs Abs 2.5 0.7 - 4.0 K/uL   Monocytes Relative 9 %   Monocytes Absolute 1.8 (H) 0.1 - 1.0 K/uL   Eosinophils Relative 0 %   Eosinophils Absolute 0.0 0.0 - 0.5 K/uL   Basophils Relative 1 %   Basophils Absolute 0.2 (H) 0.0 - 0.1 K/uL   Immature Granulocytes 1 %   Abs Immature Granulocytes 0.20 (H) 0.00 - 0.07 K/uL    Comment: Performed at [facility], [address] [place], [place] [address]  Comprehensive metabolic panel     Status: Abnormal   Collection Time: 10/30/19  3:27 AM  Result Value Ref Range   Sodium 140 135 - 145 mmol/L   Potassium 3.4 (L) 3.5 - 5.1 mmol/L   Chloride 104 98 - 111 mmol/L   CO2 20 (L) 22 - 32 mmol/L   Glucose, Bld 161 (H) 70 - 99 mg/dL   BUN 16 6 - 20 mg/dL   Creatinine, Ser 1.64 (H) 0.61 - 1.24 mg/dL   Calcium 9.6 8.9 - 10.3 mg/dL   Total Protein 8.8 (H) 6.5 - 8.1 g/dL   Albumin 5.2 (H) 3.5 - 5.0 g/dL   AST 134 (H) 15 - 41 U/L   ALT 141 (H) 0 - 44 U/L   Alkaline Phosphatase 64 38 - 126 U/L   Total Bilirubin 1.2 0.3 - 1.2 mg/dL   GFR calc non Af Amer 55 (L) >60 mL/min   GFR calc Af Amer >60 >60 mL/min   Anion gap 16 (H) 5 - 15    Comment: Performed at [facility], [address] [place],  [address]  Ethanol     Status: None   Collection Time: 10/30/19  3:27 AM  Result Value Ref Range   Alcohol, Ethyl (B) <10 <10 mg/dL    Comment: (NOTE) Lowest detectable limit for  serum alcohol is 10 mg/dL. For medical purposes only. Performed at [facility], [address] [place], [place] [id number]   Salicylate level     Status: Abnormal   Collection Time: 10/30/19  3:27 AM   Result Value Ref Range   Salicylate Lvl [id number] (L) 7.0 - 30.0 mg/dL    Comment: Performed at [facility], [address] [place], [place] [address]  Acetaminophen level     Status: Abnormal   Collection Time: 10/30/19  3:27 AM  Result Value Ref Range   Acetaminophen (Tylenol), Serum 50 (H) 10 - 30 ug/mL    Comment: (NOTE) Therapeutic concentrations vary significantly. A range of 10-30 ug/mL  may be an effective concentration for many patients. However, some  are best treated at concentrations outside of this range. Acetaminophen concentrations >150 ug/mL at 4 hours after ingestion  and >50 ug/mL at 12 hours after ingestion are often associated with  toxic reactions. Performed at [facility], [address] [place], [place] [address]   Protime-INR     Status: None   Collection Time: 10/30/19  3:27 AM  Result Value Ref Range   Prothrombin Time 13.0 11.4 - 15.2 seconds   INR 1.0 0.8 - 1.2    Comment: (NOTE) INR goal varies based on device and disease states. Performed at [facility], [address] [place], [place] [address]   Magnesium     Status: None   Collection Time: 10/30/19  3:27 AM  Result Value Ref Range   Magnesium 2.0 1.7 - 2.4 mg/dL    Comment: Performed at [facility], [address] [place],  [address]  Respiratory Panel by RT PCR (Flu A&B, Covid) - Nasopharyngeal Swab     Status: None   Collection Time: 10/30/19  6:46 AM   Specimen: Nasopharyngeal Swab  Result Value Ref Range   SARS Coronavirus 2 by RT PCR NEGATIVE NEGATIVE    Comment: (NOTE) SARS-CoV-2 target nucleic acids are NOT DETECTED. The SARS-CoV-2 RNA is generally detectable in upper respiratoy specimens during the acute phase of infection. The lowest concentration of SARS-CoV-2 viral copies this assay can detect is 131 copies/mL. A negative result does not preclude SARS-Cov-2 infection and should not be used as the sole basis for treatment  or other patient management decisions. A negative result may occur with  improper specimen collection/handling, submission of specimen other than nasopharyngeal swab, presence of viral mutation(s) within the areas targeted by this assay, and inadequate number of viral copies (<131 copies/mL). A negative result must be combined with clinical observations, patient history, and epidemiological information. The expected result is Negative. Fact Sheet for Patients:  PinkCheek.be Fact Sheet for Healthcare Providers:  GravelBags.it This test is not yet ap proved or cleared by the [place] FDA and  has been authorized for detection and/or diagnosis of SARS-CoV-2 by FDA under an Emergency Use Authorization (EUA). This EUA will remain  in effect (meaning this test can be used) for the duration of the COVID-19 declaration under Section 564(b)(1) of the Act, 21 U.S.C. section 360bbb-3(b)(1), unless the authorization is terminated or revoked sooner.    Influenza A by PCR NEGATIVE NEGATIVE   Influenza B by PCR NEGATIVE NEGATIVE    Comment: (NOTE) The Xpert Xpress SARS-CoV-2/FLU/RSV assay is intended as an aid in  the diagnosis of influenza from Nasopharyngeal swab specimens and  should not be used as a sole basis for treatment. Nasal washings and  aspirates are unacceptable for  Xpert Xpress SARS-CoV-2/FLU/RSV  testing. Fact Sheet for Patients: PinkCheek.be Fact Sheet for Healthcare Providers: GravelBags.it This test is not yet approved or cleared by the [place] FDA and  has been authorized for detection and/or diagnosis of SARS-CoV-2 by  FDA under an Emergency Use Authorization (EUA). This EUA will remain  in effect (meaning this test can be used) for the duration of the  Covid-19 declaration under Section 564(b)(1) of the Act, 21  U.S.C. section 360bbb-3(b)(1), unless  the authorization is  terminated or revoked. Performed at [facility], [address], [place], [place] [address]   Urine Drug Screen, Qualitative [facility] only)     Status: Abnormal   Collection Time: 10/30/19  6:55 AM  Result Value Ref Range   Tricyclic, Ur Screen POSITIVE (A) NONE DETECTED   Amphetamines, Ur Screen NONE DETECTED NONE DETECTED   MDMA (Ecstasy)Ur Screen NONE DETECTED NONE DETECTED   Cocaine Metabolite,Ur [place] NONE DETECTED NONE DETECTED   Opiate, Ur Screen NONE DETECTED NONE DETECTED   Phencyclidine (PCP) Ur S NONE DETECTED NONE DETECTED   Cannabinoid 50 Ng, Ur [place] NONE DETECTED NONE DETECTED   Barbiturates, Ur Screen NONE DETECTED NONE DETECTED   Benzodiazepine, Ur Scrn NONE DETECTED NONE DETECTED   Methadone Scn, Ur NONE DETECTED NONE DETECTED    Comment: (NOTE) Tricyclics + metabolites, urine    Cutoff 1000 ng/mL Amphetamines + metabolites, urine  Cutoff 1000 ng/mL MDMA (Ecstasy), urine              Cutoff 500 ng/mL Cocaine Metabolite, urine          Cutoff 300 ng/mL Opiate + metabolites, urine        Cutoff 300 ng/mL Phencyclidine (PCP), urine         Cutoff 25 ng/mL Cannabinoid, urine                 Cutoff 50 ng/mL Barbiturates + metabolites, urine  Cutoff 200 ng/mL Benzodiazepine, urine              Cutoff 200 ng/mL Methadone, urine                   Cutoff 300 ng/mL The urine drug screen provides only a preliminary, unconfirmed analytical test result and should not be used for non-medical purposes. Clinical consideration and professional judgment should be applied to any positive drug screen result due to possible interfering substances. A more specific alternate chemical method must be used in order to obtain a confirmed analytical result. Gas chromatography / mass spectrometry (GC/MS) is the preferred confirmat ory method. Performed at [facility], [address] [place], [place] [address]   Acetaminophen level     Status: None    Collection Time: 10/30/19  8:30 AM  Result Value Ref Range   Acetaminophen (Tylenol), Serum 15 10 - 30 ug/mL    Comment: (NOTE) Therapeutic concentrations vary significantly. A range of 10-30 ug/mL  may be an effective concentration for many patients. However, some  are best treated at concentrations outside of this range. Acetaminophen concentrations >150 ug/mL at 4 hours after ingestion  and >50 ug/mL at 12 hours after ingestion are often associated with  toxic reactions. Performed at [facility], [address] [place], [place] [address]   Lipase, blood     Status: None   Collection Time: 10/30/19 10:55 AM  Result Value Ref Range   Lipase 21 11 - 51 U/L    Comment: Performed at [employer]  [facility], [address] [place], [place] [address]    Current Facility-Administered Medications  Medication Dose Route Frequency Provider Last Rate Last Admin  . acetylcysteine (ACETADOTE) 24,000 mg in dextrose 5 % 600 mL (40 mg/mL) infusion  15 mg/kg/hr Intravenous Continuous Ivor Costa, MD 34 mL/hr at 10/30/19 0747 15 mg/kg/hr at 10/30/19 0747  . diazepam (VALIUM) tablet 5 mg  5 mg Oral Q6H PRN Alazia Crocket, Dorene Ar, MD   5 mg at 10/30/19 1142  . heparin injection 5,000 Units  5,000 Units Subcutaneous Q8H Ivor Costa, MD   5,000 Units at 10/30/19 1057  . hydrALAZINE (APRESOLINE) injection 5 mg  5 mg Intravenous Q2H PRN Ivor Costa, MD   5 mg at 10/30/19 1058  . ondansetron (ZOFRAN) injection 4 mg  4 mg Intravenous Q8H PRN Ivor Costa, MD   4 mg at 10/30/19 1241  . oxyCODONE (Oxy IR/ROXICODONE) immediate release tablet 5 mg  5 mg Oral Q6H PRN Ivor Costa, MD   5 mg at 10/30/19 1142  . pantoprazole (PROTONIX) EC tablet 40 mg  40 mg Oral Q1200 Ivor Costa, MD   40 mg at 10/30/19 1141  . sodium bicarbonate 150 mEq in dextrose 5 % 1,000 mL infusion   Intravenous Continuous Ivor Costa, MD 100 mL/hr at 10/30/19 1144 New Bag at 10/30/19 1144  . SUMAtriptan (IMITREX) tablet 50 mg  50 mg Oral Q2H  PRN Ivor Costa, MD       Current Outpatient Medications  Medication Sig Dispense Refill  . aspirin 325 MG EC tablet Take 1 tablet (325 mg total) by mouth daily. 30 tablet 1  . losartan (COZAAR) 100 MG tablet Take 100 mg by mouth daily.    . Multiple Vitamin (MULTIVITAMIN WITH MINERALS) TABS tablet Take 1 tablet by mouth once a week.    . traZODone (DESYREL) 100 MG tablet Take 2 tablets (200 mg total) by mouth at bedtime. 60 tablet 1  . venlafaxine XR (EFFEXOR-XR) 150 MG 24 hr capsule TAKE ONE CAPSULE BY MOUTH DAILY WITH BREAKFAST 30 capsule 1    Musculoskeletal: Strength & Muscle Tone: within normal limits Gait & Station: normal Patient leans: N/A  Psychiatric Specialty Exam: Physical Exam  Review of Systems  Psychiatric/Behavioral: Positive for dysphoric mood, self-injury, sleep disturbance and suicidal ideas. Negative for agitation, behavioral problems, confusion, decreased concentration and hallucinations. The patient is nervous/anxious. The patient is not hyperactive.     Blood pressure (!) 157/103, pulse (!) 108, temperature 99.6 F (37.6 C), temperature source Oral, resp. rate (!) 28, height 5\' 7"  (1.702 m), weight 90.7 kg, SpO2 95 %.Body mass index is 31.32 kg/m.  General Appearance: Fairly Groomed  Eye Contact:  Good  Speech: Rapid  Volume:  Normal  Mood:  Anxious  Affect:  Congruent and Full Range  Thought Process:  Coherent  Orientation:  Full (Time, Place, and Person)  Thought Content:  Logical  Suicidal Thoughts:  Yes.  without intent/plan  Homicidal Thoughts:  No  Memory:  Recent;   Fair  Judgement:  Impaired  Insight:  Present  Psychomotor Activity:  Restlessness  Concentration:  Concentration: Fair  Recall:  [employer] of Knowledge:  Fair  Language:  Fair  Akathisia:  No  Handed:  Right  AIMS (if indicated):     Assets:  Communication Skills Desire for Improvement Financial Resources/Insurance Housing Leisure Time Physical Health Social  Support Vocational/Educational  ADL's:  Intact  Cognition:  WNL  Sleep:  Treatment Plan Summary: 29 year old male with history of 1 prior suicide attempt who presents after a second suicide attempt.  This attempt is in the context of social stressors and medication noncompliance patient currently requires inpatient hospitalization for safety, stabilization, medication management.  Diagnosis: Major depressive disorder  Medications: Valium 5 mg p.o. every 6 hours as needed anxiety  Disposition: Recommend psychiatric Inpatient admission when medically cleared.  Dixie Dials, MD 10/30/2019 1:00 PM

## 2019-10-30 NOTE — ED Notes (Signed)
Jessica RN, aware of bed assigned  

## 2019-10-30 NOTE — ED Notes (Signed)
Pt belongings include: 1 pair gray tennis shoes, 1 pr white socks, 1 pr jeans, 1 pr plaid boxer briefs, 1 gray t-shirt, 1 black hoodie, and 1 empty pill bottle. Pt did not arrive with any jewelry, cash, cell phone, wallet, or other valuables. Pt's belongings bagged and labeled per policy and stored on secured BHU unit.

## 2019-10-30 NOTE — ED Provider Notes (Addendum)
[facility] Emergency Department Provider Note  ____________________________________________  Time seen: Approximately 3:47 AM  I have reviewed the triage vital signs and the nursing notes.   HISTORY  Chief Complaint Ingestion and Suicidal   HPI Glenn Rasmussen is a 29 y.o. male with a history of anxiety, hypertension, IBS, depression, migraines who presents after a suicide attempt.  Patient reports taking 60 x 100 mg losartan at 7:30 PM.  His mother noticed the ingestion and called 911.  The ingestion was 8 hours ago.  Patient is complaining of feeling very nauseous and dizzy.  Patient has vomited 1 time.  Patient reports doing this is a suicide attempt.  Patient had a prior suicide attempt in June 2020 where he had an overdose as well on his antihypertensive medications.  Patient reports that he has a court hearing tomorrow and that is making him very stressed out which led him to do this tonight  He denies any coingestions, alcohol or other substance abuse.   Patient reports not having any of his other medications since losing his job several months ago.  The only medication he has at home was losartan.  He denies any coingestions.  Past Medical History:  Diagnosis Date  . Anxiety   . Hypertension   . IBS (irritable bowel syndrome)   . Migraines     Patient Active Problem List   Diagnosis Date Noted  . 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 Intentional benzodiazepine overdose ([place])   . MDD (major depressive disorder), recurrent severe, without psychosis ([place]) 03/13/2019  . Multiple drug overdose 03/09/2019  . Suicide attempt ([place]) 03/09/2019  . HTN (hypertension) 03/09/2019  . AKI (acute kidney injury) ([place]) 03/09/2019  . Acute respiratory failure ([place])   . Antihypertensive agent overdose   . Calcium channel blocker overdose     Past Surgical History:  Procedure Laterality Date  . APPENDECTOMY     . CHOLECYSTECTOMY    . DIALYSIS/PERMA CATHETER INSERTION N/A 03/26/2019   Procedure: DIALYSIS/PERMA CATHETER INSERTION;  Surgeon: Algernon Huxley, MD;  Location: [facility] [facility];  Service: Cardiovascular;  Laterality: N/A;  . DIALYSIS/PERMA CATHETER REMOVAL N/A 04/03/2019   Procedure: DIALYSIS/PERMA CATHETER REMOVAL;  Surgeon: Algernon Huxley, MD;  Location: [facility];  Service: Cardiovascular;  Laterality: N/A;  . TONSILLECTOMY      Prior to Admission medications   Medication Sig Start Date End Date Taking? Authorizing Provider  aspirin 325 MG EC tablet Take 1 tablet (325 mg total) by mouth daily. 04/03/19   Clapacs, Madie Reno, MD  clonazePAM (KLONOPIN) 0.5 MG tablet Take 1 tablet (0.5 mg total) by mouth daily as needed (anxiety). 04/03/19   Clapacs, Madie Reno, MD  pantoprazole (PROTONIX) 40 MG tablet Take 1 tablet (40 mg total) by mouth at bedtime. 04/03/19   Clapacs, Madie Reno, MD  polyethylene glycol (MIRALAX / GLYCOLAX) 17 g packet Place 17 g into feeding tube daily as needed for mild constipation or moderate constipation. 04/03/19   Clapacs, Madie Reno, MD  traZODone (DESYREL) 100 MG tablet Take 2 tablets (200 mg total) by mouth at bedtime. 04/03/19   Clapacs, Madie Reno, MD  venlafaxine XR (EFFEXOR-XR) 150 MG 24 hr capsule TAKE ONE CAPSULE BY MOUTH DAILY WITH BREAKFAST 07/27/19   Clapacs, Madie Reno, MD    Allergies Patient has no known allergies.  Family History  Problem Relation Age of Onset  . Lung cancer Mother   .  Lung cancer Father     Social History Social History   Tobacco Use  . Smoking status: Never Smoker  . Smokeless tobacco: Never Used  Substance Use Topics  . Alcohol use: No  . Drug use: No    Review of Systems  Constitutional: Negative for fever. + lightheadednedd Eyes: Negative for visual changes. ENT: Negative for sore throat. Neck: No neck pain  Cardiovascular: Negative for chest pain. Respiratory: Negative for shortness of breath. Gastrointestinal: Negative  for abdominal pain, diarrhea. + N/V Genitourinary: Negative for dysuria. Musculoskeletal: Negative for back pain. Skin: Negative for rash. Neurological: Negative for headaches, weakness or numbness. Psych: + SI. No HI  ____________________________________________   PHYSICAL EXAM:  VITAL SIGNS: ED Triage Vitals  Enc Vitals Group     BP 10/30/19 0322 (!) 159/107     Pulse Rate 10/30/19 0322 (!) 131     Resp 10/30/19 0322 18     Temp 10/30/19 0322 99.6 F (37.6 C)     Temp Source 10/30/19 0322 Oral     SpO2 10/30/19 0322 97 %     Weight 10/30/19 0324 200 lb (90.7 kg)     Height 10/30/19 0324 5\' 7"  (1.702 m)     Head Circumference --      Peak Flow --      Pain Score 10/30/19 0322 0     Pain Loc --      Pain Edu? --      Excl. in [place]? --     Constitutional: Alert and oriented, anxious looking.  HEENT:      Head: Normocephalic and atraumatic.         Eyes: Conjunctivae are normal. Sclera is non-icteric. Pupils are dilated but reactive      Mouth/Throat: Mucous membranes are moist.       Neck: Supple with no signs of meningismus. Cardiovascular: Tachycardic with regular rhythm. No murmurs, gallops, or rubs. 2+ symmetrical distal pulses are present in all extremities. No JVD. Respiratory: Normal respiratory effort. Lungs are clear to auscultation bilaterally. No wheezes, crackles, or rhonchi.  Gastrointestinal: Soft, non tender, and non distended with positive bowel sounds. No rebound or guarding. Musculoskeletal: Nontender with normal range of motion in all extremities. No edema, cyanosis, or erythema of extremities. Neurologic: Normal speech and language. Face is symmetric. Moving all extremities. No gross focal neurologic deficits are appreciated. Skin: Skin is warm, dry and intact. No rash noted. Psychiatric: Mood and affect are normal. Speech and behavior are normal.  ____________________________________________   LABS (all labs ordered are listed, but only abnormal  results are displayed)  Labs Reviewed  CBC WITH DIFFERENTIAL/PLATELET - Abnormal; Notable for the following components:      Result Value   WBC 20.2 (*)    MCV 77.8 (*)    Platelets 462 (*)    Neutro Abs 15.6 (*)    Monocytes Absolute 1.8 (*)    Basophils Absolute 0.2 (*)    Abs Immature Granulocytes 0.20 (*)    All other components within normal limits  COMPREHENSIVE METABOLIC PANEL - Abnormal; Notable for the following components:   Potassium 3.4 (*)    CO2 20 (*)    Glucose, Bld 161 (*)    Creatinine, Ser 1.64 (*)    Total Protein 8.8 (*)    Albumin 5.2 (*)    AST 134 (*)    ALT 141 (*)    GFR calc non Af Amer 55 (*)    Anion gap 16 (*)  All other components within normal limits  SALICYLATE LEVEL - Abnormal; Notable for the following components:   Salicylate Lvl [id number] (*)    All other components within normal limits  ACETAMINOPHEN LEVEL - Abnormal; Notable for the following components:   Acetaminophen (Tylenol), Serum 50 (*)    All other components within normal limits  RESPIRATORY PANEL BY RT PCR (FLU A&B, COVID)  ETHANOL  PROTIME-INR  URINE DRUG SCREEN, QUALITATIVE ([facility] ONLY)  MAGNESIUM  ACETAMINOPHEN LEVEL   ____________________________________________  EKG  ED ECG REPORT I, Rudene Re, the attending physician, personally viewed and interpreted this ECG.  Sinus tachycardia, rate of 130, left axis deviation, normal intervals, no ST elevations or depressions. ____________________________________________  RADIOLOGY  none  ____________________________________________   PROCEDURES  Procedure(s) performed: None Procedures Critical Care performed:  Yes  CRITICAL CARE Performed by: Rudene Re  ?  Total critical care time: 30 min  Critical care time was exclusive of separately billable procedures and treating other patients.  Critical care was necessary to treat or prevent imminent or life-threatening deterioration.  Critical care  was time spent personally by me on the following activities: development of treatment plan with patient and/or surrogate as well as nursing, discussions with consultants, evaluation of patient's response to treatment, examination of patient, obtaining history from patient or surrogate, ordering and performing treatments and interventions, ordering and review of laboratory studies, ordering and review of radiographic studies, pulse oximetry and re-evaluation of patient's condition.  ____________________________________________   INITIAL IMPRESSION / ASSESSMENT AND PLAN / ED COURSE  29 y.o. male with a history of anxiety, hypertension, IBS, depression, migraines who presents after ingesting 60 x 100mg  losartan as a suicide attempt at 7:30PM.  On physical exam, patient seems to be on a sympathomimetic state.  Is extremely hypertensive, tachycardic, his pupils are dilated.  This obviously does not match an ingestion of losartan.  Patient is adamant that he did not take anything else.  He reports not having any other medications at home since losing his job several months ago and the only medicine he can afford is his losartan.  I spoke with [facility] who recommended monitoring patient until his heart rate starts to trend down.  Recommended IV fluids and close monitoring on telemetry.  Will check labs for coingestions, salicylate level and acetaminophen level.  Will check a drug screen.  Patient under IVC.  Will consult psychiatry.  Clinical Course as of Oct 30 655  Fri Oct 30, 2019  [id number] Acetaminophen level of 50 which makes me even more concerned the patient does not being fully truthful with what he ingested.  Based on the time of ingestion and [place] nomogram no indication for NAC at this time. Will get a repeat level in 4 hours. LFTs also elevated. Will check INR. Patient remains very tachycardic and hypertensive. Has received 2 L NS and still with no urine output. Bladder scan of  350cc but patient denies any pain or pressure. 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 Will continue to monitor and admit to Hospitalist service.    [CV]  [phone] Patient now reports taking 12 x 500mg  of tylenol and 10 x 50mg  of benadryl as well at the same time at 7:30PM. Patient remains extremely tachycardic and hypertensive. Poison Control recommending initiating NAC.Will admit to Hospitalist service for close monitoring due to concerns of sympathomimetic vs anticholinergic syndrome.    [CV]    Clinical Course User Index [CV] Rudene Re, MD      As part of  my medical decision making, I reviewed the following data within the [facility] notes reviewed and incorporated, Labs reviewed , EKG interpreted , Old EKG reviewed, Old chart reviewed, A consult was requested and obtained from this/these consultant(s) psychiatry and poison control, Notes from prior ED visits and [place] Controlled Substance Database   Please note:  Patient was evaluated in Emergency Department today for the symptoms described in the history of present illness. Patient was evaluated in the context of the global COVID-19 pandemic, which necessitated consideration that the patient might be at risk for infection with the SARS-CoV-2 virus that causes COVID-19. Institutional protocols and algorithms that pertain to the evaluation of patients at risk for COVID-19 are in a state of rapid change based on information released by regulatory bodies including the CDC and federal and state organizations. These policies and algorithms were followed during the patient's care in the ED.  Some ED evaluations and interventions may be delayed as a result of limited staffing during the pandemic.   ____________________________________________   FINAL CLINICAL IMPRESSION(S) / ED DIAGNOSES   Final diagnoses:  Suicide attempt ([place])  Intentional overdose of drug in tablet form ([place])  AKI (acute kidney injury) ([place])       NEW MEDICATIONS STARTED DURING THIS VISIT:  ED Discharge Orders    None       Note:  This document was prepared using Dragon voice recognition software and may include unintentional dictation errors.    Alfred Levins, [place], MD 10/30/19 [place], [place], [place] 11/10/19 [phone]

## 2019-10-30 NOTE — H&P (Addendum)
History and Physical    Glenn Rasmussen [id number] DOB: 07/19/1991 DOA: 10/30/2019  Referring MD/NP/PA:   PCP: Maeola Sarah, MD   Patient coming from:  The patient is coming from home.  At baseline, pt is independent for most of ADL.        Chief Complaint: Suicide attempt and overdose  HPI: Glenn Rasmussen is a 29 y.o. male with medical history significant of hypertension, GERD, bipolar, depression, anxiety, migraine headache, IBS, suicide, who presents with suicide attempt and overdose.  Initially pt said that he took 60 of his 100 mg losartan at 1930 on Thursday evening. Pt states he was wanting to kill himself. Later on, he states that he also took many pills of Tylenol and Benadryl. Per EDP, his mother noticed the ingestion and called 911. Patient reports that he has a court hearing tomorrow and that is making him very stressed out which led him to do this suicidal attempt. Pt states that he has some dizziness, headache and burining pain in his stomach.  He has nausea and vomited once.  No diarrhea.  Patient states that he had some chest discomfort earlier, which has resolved.  Currently no cough, shortness breath, chest pain, fever or chills.  Denies symptoms of UTI or unilateral weakness.  When I saw patient in the ED, he is calm, he denies current suicidal or homicidal ideation. Patient is IVC.  ED Course: pt was found to have WBC 20.2, INR 1.0, Tylenol level 50, negative RVP PCR, positive UDS for tricyclic, UA negative urinalysis, pending lipase, AKI with creatinine 1.64 and BUN 16, alchol <10,  potassium 3.4, Mg 2.0,  temperature 99.6, blood pressure 156/112, tachycardia, tachypnea, oxygen saturation 94 to 96% on room air. IV Acetylcysteine was started. Patient is admitted to stepdown as inpatient.    Review of Systems:   General: no fevers, chills, no body weight gain, has fatigue HEENT: no blurry vision, hearing changes or sore throat Respiratory: no dyspnea, coughing,  wheezing CV: no chest pain, no palpitations GI: has nausea, vomiting, abdominal pain, no diarrhea, constipation GU: no dysuria, burning on urination, increased urinary frequency, hematuria  Ext: no leg edema Neuro: no unilateral weakness, numbness, or tingling, no vision change or hearing loss Skin: no rash, no skin tear. MSK: No muscle spasm, no deformity, no limitation of range of movement in spin Heme: No easy bruising.  Travel history: No recent long distant travel. Psych: Has suicidal attempt and overdose  Allergy: No Known Allergies  Past Medical History:  Diagnosis Date  . Anxiety   . Hypertension   . IBS (irritable bowel syndrome)   . Migraines     Past Surgical History:  Procedure Laterality Date  . APPENDECTOMY    . CHOLECYSTECTOMY    . DIALYSIS/PERMA CATHETER INSERTION N/A 03/26/2019   Procedure: DIALYSIS/PERMA CATHETER INSERTION;  Surgeon: Algernon Huxley, MD;  Location: [facility];  Service: Cardiovascular;  Laterality: N/A;  . DIALYSIS/PERMA CATHETER REMOVAL N/A 04/03/2019   Procedure: DIALYSIS/PERMA CATHETER REMOVAL;  Surgeon: Algernon Huxley, MD;  Location: [facility];  Service: Cardiovascular;  Laterality: N/A;  . TONSILLECTOMY      Social History:  reports that he has never smoked. He has never used smokeless tobacco. He reports that he does not drink alcohol or use drugs.  Family History:  Family History  Problem Relation Age of Onset  . Lung cancer Mother   . Lung cancer Father      Prior to  Admission medications   Medication Sig Start Date End Date Taking? Authorizing Provider  aspirin 325 MG EC tablet Take 1 tablet (325 mg total) by mouth daily. 04/03/19   Clapacs, Madie Reno, MD  clonazePAM (KLONOPIN) 0.5 MG tablet Take 1 tablet (0.5 mg total) by mouth daily as needed (anxiety). 04/03/19   Clapacs, Madie Reno, MD  pantoprazole (PROTONIX) 40 MG tablet Take 1 tablet (40 mg total) by mouth at bedtime. 04/03/19   Clapacs, Madie Reno, MD  polyethylene  glycol (MIRALAX / GLYCOLAX) 17 g packet Place 17 g into feeding tube daily as needed for mild constipation or moderate constipation. 04/03/19   Clapacs, Madie Reno, MD  traZODone (DESYREL) 100 MG tablet Take 2 tablets (200 mg total) by mouth at bedtime. 04/03/19   Clapacs, Madie Reno, MD  venlafaxine XR (EFFEXOR-XR) 150 MG 24 hr capsule TAKE ONE CAPSULE BY MOUTH DAILY WITH BREAKFAST 07/27/19   Gonzella Lex, MD    Physical Exam: Vitals:   10/30/19 0800 10/30/19 0900 10/30/19 0930 10/30/19 1058  BP: (!) 159/114 (!) 185/116 (!) 203/128 (!) 189/112  Pulse: (!) 104 96 (!) 120   Resp: (!) 30 (!) 29 (!) 29   Temp:      TempSrc:      SpO2: 98% (!) 89% 98%   Weight:      Height:       General: Not in acute distress HEENT:       Eyes: PERRL, EOMI, no scleral icterus.       ENT: No discharge from the ears and nose, no pharynx injection, no tonsillar enlargement.        Neck: No JVD, no bruit, no mass felt. Heme: No neck lymph node enlargement. Cardiac: S1/S2, RRR, No murmurs, No gallops or rubs. Respiratory: No rales, wheezing, rhonchi or rubs. GI: Soft, nondistended, nontender, no rebound pain, no organomegaly, BS present. GU: No hematuria Ext: No pitting leg edema bilaterally. 2+DP/PT pulse bilaterally. Musculoskeletal: No joint deformities, No joint redness or warmth, no limitation of ROM in spin. Skin: No rashes.  Neuro: Alert, oriented X3, cranial nerves II-XII grossly intact, moves all extremities normally. Psych: currently no suicidal or hemocidal ideation.  Labs on Admission: I have personally reviewed following labs and imaging studies  CBC: Recent Labs  Lab 10/30/19 0327  WBC 20.2*  NEUTROABS 15.6*  HGB 15.0  HCT 43.2  MCV 77.8*  PLT [id number]*   Basic Metabolic Panel: Recent Labs  Lab 10/30/19 0327  NA 140  K 3.4*  CL 104  CO2 20*  GLUCOSE 161*  BUN 16  CREATININE 1.64*  CALCIUM 9.6  MG 2.0   GFR: Estimated Creatinine Clearance: 70.7 mL/min (A) (by C-G formula based on  SCr of 1.64 mg/dL (H)). Liver Function Tests: Recent Labs  Lab 10/30/19 0327  AST 134*  ALT 141*  ALKPHOS 64  BILITOT 1.2  PROT 8.8*  ALBUMIN 5.2*   Recent Labs  Lab 10/30/19 1055  LIPASE 21   No results for input(s): AMMONIA in the last 168 hours. Coagulation Profile: Recent Labs  Lab 10/30/19 0327  INR 1.0   Cardiac Enzymes: No results for input(s): CKTOTAL, CKMB, CKMBINDEX, TROPONINI in the last 168 hours. BNP (last 3 results) No results for input(s): PROBNP in the last 8760 hours. HbA1C: No results for input(s): HGBA1C in the last 72 hours. CBG: No results for input(s): GLUCAP in the last 168 hours. Lipid Profile: No results for input(s): CHOL, HDL, LDLCALC, TRIG, CHOLHDL, LDLDIRECT in the  last 72 hours. Thyroid Function Tests: No results for input(s): TSH, T4TOTAL, FREET4, T3FREE, THYROIDAB in the last 72 hours. Anemia Panel: No results for input(s): VITAMINB12, FOLATE, FERRITIN, TIBC, IRON, RETICCTPCT in the last 72 hours. Urine analysis:    Component Value Date/Time   COLORURINE STRAW (A) 04/28/2019 0144   APPEARANCEUR CLEAR (A) 04/28/2019 0144   LABSPEC 1.006 04/28/2019 0144   PHURINE 7.0 04/28/2019 0144   GLUCOSEU NEGATIVE 04/28/2019 0144   HGBUR NEGATIVE 04/28/2019 0144   BILIRUBINUR NEGATIVE 04/28/2019 0144   KETONESUR NEGATIVE 04/28/2019 0144   PROTEINUR NEGATIVE 04/28/2019 0144   NITRITE NEGATIVE 04/28/2019 0144   LEUKOCYTESUR NEGATIVE 04/28/2019 0144   Sepsis Labs: @LABRCNTIP (procalcitonin:4,lacticidven:4) ) Recent Results (from the past 240 hour(s))  Respiratory Panel by RT PCR (Flu A&B, Covid) - Nasopharyngeal Swab     Status: None   Collection Time: 10/30/19  6:46 AM   Specimen: Nasopharyngeal Swab  Result Value Ref Range Status   SARS Coronavirus 2 by RT PCR NEGATIVE NEGATIVE Final    Comment: (NOTE) SARS-CoV-2 target nucleic acids are NOT DETECTED. The SARS-CoV-2 RNA is generally detectable in upper respiratoy specimens during the  acute phase of infection. The lowest concentration of SARS-CoV-2 viral copies this assay can detect is 131 copies/mL. A negative result does not preclude SARS-Cov-2 infection and should not be used as the sole basis for treatment or other patient management decisions. A negative result may occur with  improper specimen collection/handling, submission of specimen other than nasopharyngeal swab, presence of viral mutation(s) within the areas targeted by this assay, and inadequate number of viral copies (<131 copies/mL). A negative result must be combined with clinical observations, patient history, and epidemiological information. The expected result is Negative. Fact Sheet for Patients:  PinkCheek.be Fact Sheet for Healthcare Providers:  GravelBags.it This test is not yet ap proved or cleared by the [place] FDA and  has been authorized for detection and/or diagnosis of SARS-CoV-2 by FDA under an Emergency Use Authorization (EUA). This EUA will remain  in effect (meaning this test can be used) for the duration of the COVID-19 declaration under Section 564(b)(1) of the Act, 21 U.S.C. section 360bbb-3(b)(1), unless the authorization is terminated or revoked sooner.    Influenza A by PCR NEGATIVE NEGATIVE Final   Influenza B by PCR NEGATIVE NEGATIVE Final    Comment: (NOTE) The Xpert Xpress SARS-CoV-2/FLU/RSV assay is intended as an aid in  the diagnosis of influenza from Nasopharyngeal swab specimens and  should not be used as a sole basis for treatment. Nasal washings and  aspirates are unacceptable for Xpert Xpress SARS-CoV-2/FLU/RSV  testing. Fact Sheet for Patients: PinkCheek.be Fact Sheet for Healthcare Providers: GravelBags.it This test is not yet approved or cleared by the [place] FDA and  has been authorized for detection and/or diagnosis of SARS-CoV-2  by  FDA under an Emergency Use Authorization (EUA). This EUA will remain  in effect (meaning this test can be used) for the duration of the  Covid-19 declaration under Section 564(b)(1) of the Act, 21  U.S.C. section 360bbb-3(b)(1), unless the authorization is  terminated or revoked. Performed at [facility], [address] [place], [place] [address]      Radiological Exams on Admission: No results found.   EKG: Independently reviewed.  Sinus tachycardia, QTC 448, LAE   Assessment/Plan Principal Problem:   Multiple drug overdose Active Problems:   Suicide attempt ([place])   HTN (hypertension)   AKI (acute kidney injury) ([facility])   MDD (  major depressive disorder), recurrent severe, without psychosis ([place])   Hypokalemia   Leukocytosis   Abnormal LFTs   Migraine headache   GERD (gastroesophageal reflux disease)   Bipolar 1 disorder ([facility])   Multiple drug overdose and suicide attempt: UDS is positive for tricyclic.  Patient reports overdose losartan, Tylenol, Benadryl. Poison control was contacted, "Recommendation: Watch for possible seizures and EKG changes with a 18-24hr risk window . Order: Serial EKGs Q6 hrs, Mg level, Acetylcysteine PO. GinaQD:[id number]".  Initial EKG showed QTC 448, but the repeated EKG showed QTC 507 at 8:00 AM.  -will admit to SDU as inpt -consulted intensivist, Dr. Lanney Gins -IV acetylcysteine started in ED -IV fluid: Patient received 1 L normal saline, will continue sodium bicarbonate 150 mEQ in D5 at 100 cc/h -q6h EKG -check tylenol level in AM -Psychiatrist, Dr. Claris Gower was consulted -pt is IVC'ed -sitter at the bedside and suicidal precaution -will hold all home meds now since we are not sure if he overdosed other meds  HTN (hypertension): Patient reported that he took 37 of his 100 mg losartan at [place] on Thursday evening, but his blood pressure is elevated, 156/112 --> 203/128. -prn IV hydralazine  -check Bp q2h  AKI (acute  kidney injury) ([place]): -IVF as above -hold home losarten  MDD (major depressive disorder), recurrent severe, without psychosis ([place]) and anxiety, and bipolar disorder: -f/u Dr. Asher Muir recommendations  Hypokalemia: K= 3.4  on admission. - Repleted - Check Mg level --> Mg 2.0  Leukocytosis: WBC, 20.2, may be reactive -f/u blood culture.  Abnormal LFTs: may be due to tylenol overdose -check hepatitis panel -HIV antibody -Avoid using Tylenol  Migraine headache: -As needed sumatriptan  GERD (gastroesophageal reflux disease): -Protonix   Inpatient status:  # Patient requires inpatient status due to high intensity of service, high risk for further deterioration and high frequency of surveillance required.  I certify that at the point of admission it is my clinical judgment that the patient will require inpatient hospital care spanning beyond 2 midnights from the point of admission.  . This patient has multiple chronic comorbidities including hypertension, GERD, depression, anxiety, migraine headache, IBS, suicide . Now patient has presenting with suicide attempt and multiple drug overdose. . The initial radiographic and laboratory data are worrisome because of elevated tylenol level and positive UDS for tricyclic . Current medical needs: please see my assessment and plan . Predictability of an adverse outcome (risk): Marland Kitchen Patient has multiple comorbidities as listed above. Now presents with suicide attempt and multiple drug overdose. Patient's presentation is highly complicated.  Patient is at high risk of deteriorating.  Will need to be treated in hospital for at least 2 days.          DVT ppx: SQ Heparin    Code Status: Full code Family Communication: None at bed side.      Disposition Plan:  To be detemined Consults called: Psychiatry Dr. Philmore Pali Admission status: Med-surg bed as inpt   Date of Service 10/30/2019    [place] [facility]   If 7PM-7AM,  please contact night-coverage [email or web address] Password [facility] 10/30/2019, 11:48 AM

## 2019-10-30 NOTE — ED Notes (Signed)
IVC/ Consult pending 

## 2019-10-30 NOTE — ED Notes (Signed)
Pt dressed into hospital appropriate scrubs at this time by this RN and witnessed by Walden Field, Therapist, sports.

## 2019-10-30 NOTE — Consult Note (Signed)
MEDICATION RELATED CONSULT NOTE - FOLLOW UP   Pharmacy Consult for Acetylcysteine PO/IV pharmacy consult Indication: Pharmacist assistance with acetaminophen overdose treatment plan including recommendations from the [facility].  No Known Allergies  Patient Measurements: Height: 5\' 7"  (170.2 cm) Weight: 200 lb (90.7 kg) IBW/kg (Calculated) : 66.1  Vital Signs: Temp: 99.6 F (37.6 C) (01/22 0322) Temp Source: Oral (01/22 0322) BP: 163/108 (01/22 0700) Pulse Rate: 113 (01/22 0700) Intake/Output from previous day: 01/21 0701 - 01/22 0700 In: 2000 [IV Piggyback:2000] Out: -  Intake/Output from this shift: No intake/output data recorded.  Labs: Recent Labs    10/30/19 0327  WBC 20.2*  HGB 15.0  HCT 43.2  PLT 462*  CREATININE 1.64*  ALBUMIN 5.2*  PROT 8.8*  AST 134*  ALT 141*  ALKPHOS 64  BILITOT 1.2   Estimated Creatinine Clearance: 70.7 mL/min (A) (by C-G formula based on SCr of 1.64 mg/dL (H)).   Microbiology: No results found for this or any previous visit (from the past 720 hour(s)).  Medications:  Pt home medications include asa 325mg , clonozepam, pant   Assessment: Glenn Rasmussen is a 29 y.o. male with a history of anxiety, hypertension, IBS, depression, migraines who presents after a suicide attempt.   Patient reports taking 60 x 100 mg losartan at 7:30 PM 1/21 and has a detectable tylenol level 50ug/ml @ 0327.  Pt is unreliable historian and did not initially report tylenol ingestion.  Plan:  Confirmed choice of IV acetylcysteine with provider per pt nausea/npo status.  Will give 150mg /kg loading dose, followed by 15mg /kg/hr.  Pharmacy to follow-up with poison control and provider to update subsequent tylenol level.  Lu Duffel, PharmD, BCPS Clinical Pharmacist 10/30/2019 7:31 AM

## 2019-10-30 NOTE — ED Triage Notes (Addendum)
Pt presents to ED via EMS from home after he to 60 of his 100mg  losartan at 1930 on Thursday evening. Pt states he was wanting to kill himself. EMS called by pt mom. Pt does contract for safety with this nurse and states he does not currently want to hurt himself.  Opt only c/o is dizziness and burning in his stomach.

## 2019-10-31 ENCOUNTER — Other Ambulatory Visit: Payer: Self-pay

## 2019-10-31 DIAGNOSIS — E876 Hypokalemia: Secondary | ICD-10-CM

## 2019-10-31 DIAGNOSIS — T50912A Poisoning by multiple unspecified drugs, medicaments and biological substances, intentional self-harm, initial encounter: Secondary | ICD-10-CM

## 2019-10-31 DIAGNOSIS — T1491XA Suicide attempt, initial encounter: Secondary | ICD-10-CM

## 2019-10-31 LAB — COMPREHENSIVE METABOLIC PANEL
ALT: 126 U/L — ABNORMAL HIGH (ref 0–44)
AST: 71 U/L — ABNORMAL HIGH (ref 15–41)
Albumin: 4.3 g/dL (ref 3.5–5.0)
Alkaline Phosphatase: 59 U/L (ref 38–126)
Anion gap: 11 (ref 5–15)
BUN: 14 mg/dL (ref 6–20)
CO2: 27 mmol/L (ref 22–32)
Calcium: 9.2 mg/dL (ref 8.9–10.3)
Chloride: 105 mmol/L (ref 98–111)
Creatinine, Ser: 1.46 mg/dL — ABNORMAL HIGH (ref 0.61–1.24)
GFR calc Af Amer: >60 mL/min (ref >60)
GFR calc non Af Amer: >60 mL/min (ref >60)
Glucose, Bld: 126 mg/dL — ABNORMAL HIGH (ref 70–99)
Potassium: 3.2 mmol/L — ABNORMAL LOW (ref 3.5–5.1)
Sodium: 143 mmol/L (ref 135–145)
Total Bilirubin: 1.1 mg/dL (ref 0.3–1.2)
Total Protein: 6.9 g/dL (ref 6.5–8.1)

## 2019-10-31 LAB — ACETAMINOPHEN LEVEL: Acetaminophen (Tylenol), Serum: <10 ug/mL — ABNORMAL LOW (ref 10–30)

## 2019-10-31 LAB — CBC
HCT: 35.5 % — ABNORMAL LOW (ref 39.0–52.0)
Hemoglobin: 11.9 g/dL — ABNORMAL LOW (ref 13.0–17.0)
MCH: 26.6 pg (ref 26.0–34.0)
MCHC: 33.5 g/dL (ref 30.0–36.0)
MCV: 79.4 fL — ABNORMAL LOW (ref 80.0–100.0)
Platelets: 316 10*3/uL (ref 150–400)
RBC: 4.47 MIL/uL (ref 4.22–5.81)
RDW: 13.3 % (ref 11.5–15.5)
WBC: 10.7 10*3/uL — ABNORMAL HIGH (ref 4.0–10.5)
nRBC: 0 % (ref 0.0–0.2)

## 2019-10-31 LAB — HEPATITIS PANEL, ACUTE
HCV Ab: NONREACTIVE
Hep A IgM: NONREACTIVE
Hep B C IgM: NONREACTIVE
Hepatitis B Surface Ag: NONREACTIVE

## 2019-10-31 LAB — PHOSPHORUS: Phosphorus: 3.6 mg/dL (ref 2.5–4.6)

## 2019-10-31 LAB — MRSA PCR SCREENING: MRSA by PCR: NEGATIVE

## 2019-10-31 LAB — PROTIME-INR
INR: 1 (ref 0.8–1.2)
Prothrombin Time: 13.1 seconds (ref 11.4–15.2)

## 2019-10-31 LAB — MAGNESIUM: Magnesium: 1.7 mg/dL (ref 1.7–2.4)

## 2019-10-31 MED ORDER — SODIUM BICARBONATE 8.4 % IV SOLN: INTRAVENOUS | Status: DC

## 2019-10-31 MED ORDER — CHLORHEXIDINE GLUCONATE CLOTH 2 % EX PADS: 6.0000 | MEDICATED_PAD | Freq: Every day | CUTANEOUS | Status: DC

## 2019-10-31 MED ORDER — TRAZODONE HCL 100 MG PO TABS
200.0000 mg | ORAL_TABLET | Freq: Every day | ORAL | Status: DC
  Administered 2019-10-31 – 2019-11-01 (×2): 200 mg via ORAL
  Filled 2019-10-31 (×2): qty 2

## 2019-10-31 MED ORDER — MAGNESIUM SULFATE 2 GM/50ML IV SOLN
2.0000 g | Freq: Once | INTRAVENOUS | Status: AC
  Administered 2019-10-31: 2 g via INTRAVENOUS
  Filled 2019-10-31: qty 50

## 2019-10-31 MED ORDER — INFLUENZA VAC A&B SA ADJ QUAD 0.5 ML IM PRSY
0.5000 mL | PREFILLED_SYRINGE | INTRAMUSCULAR | Status: AC
  Administered 2019-11-02: 10:00:00 0.5 mL via INTRAMUSCULAR
  Filled 2019-10-31: qty 0.5

## 2019-10-31 MED ORDER — POTASSIUM CHLORIDE CRYS ER 20 MEQ PO TBCR
30.0000 meq | EXTENDED_RELEASE_TABLET | ORAL | Status: AC
  Administered 2019-10-31 (×2): 30 meq via ORAL
  Filled 2019-10-31 (×3): qty 1

## 2019-10-31 MED ORDER — SODIUM CHLORIDE 0.9 % IV SOLN: INTRAVENOUS | Status: DC

## 2019-10-31 MED ORDER — AMLODIPINE BESYLATE 10 MG PO TABS
10.0000 mg | ORAL_TABLET | Freq: Every day | ORAL | Status: DC
  Administered 2019-11-01 – 2019-11-02 (×2): 10 mg via ORAL
  Filled 2019-10-31 (×2): qty 1

## 2019-10-31 MED ORDER — SODIUM BICARBONATE-DEXTROSE 150-5 MEQ/L-% IV SOLN
150.0000 meq | INTRAVENOUS | Status: DC
  Filled 2019-10-31 (×2): qty 1000

## 2019-10-31 MED ORDER — AMLODIPINE BESYLATE 5 MG PO TABS
5.0000 mg | ORAL_TABLET | Freq: Every day | ORAL | Status: DC
  Administered 2019-10-31: 09:00:00 5 mg via ORAL
  Filled 2019-10-31: qty 1

## 2019-10-31 MED ORDER — AMLODIPINE BESYLATE 5 MG PO TABS
5.0000 mg | ORAL_TABLET | Freq: Once | ORAL | Status: AC
  Administered 2019-10-31: 15:00:00 5 mg via ORAL
  Filled 2019-10-31: qty 1

## 2019-10-31 NOTE — Progress Notes (Signed)
Patient ID: Glenn Rasmussen, male   DOB: [birth year], 29 y.o.   MRN: [id number] [facility] PROGRESS NOTE  DAELAN HIMMELREICH [id number] DOB: [birth year] DOA: 10/30/2019 PCP: Maeola Sarah, MD  HPI/Subjective: Patient states that he has had a lot of life stressors.  He got accused of taking money from a person that he thought was a good friend.  He had a court date yesterday but ended up in the hospital after taking 60 losartan tablets, 1050 mg Benadryl tablets and 12 pills of Tylenol 500 mg.  The patient was admitted to the stepdown unit and was transferred out to the floor last night and he did not sleep.  Physically he feels okay today.  States he is always had some twitching with his upper extremities  Objective: Vitals:   10/31/19 0458 10/31/19 1231  BP: (!) 156/110 (!) 147/96  Pulse: (!) 101 92  Resp: 20   Temp: 98.3 F (36.8 C) 98.6 F (37 C)  SpO2: 95% 97%    Intake/Output Summary (Last 24 hours) at 10/31/2019 1437 Last data filed at 10/31/2019 1215 Gross per 24 hour  Intake 1401.49 ml  Output 752 ml  Net 649.49 ml   Filed Weights   10/30/19 0324 10/31/19 0000  Weight: 90.7 kg 97.8 kg    ROS: Review of Systems  Constitutional: Negative for chills and fever.  Eyes: Negative for blurred vision.  Respiratory: Negative for cough and shortness of breath.   Cardiovascular: Negative for chest pain.  Gastrointestinal: Negative for abdominal pain, constipation, diarrhea, nausea and vomiting.  Genitourinary: Negative for dysuria.  Musculoskeletal: Negative for joint pain.  Neurological: Positive for tremors. Negative for dizziness and headaches.   Exam: Physical Exam  Constitutional: He is oriented to person, place, and time.  HENT:  Nose: No mucosal edema.  Mouth/Throat: No oropharyngeal exudate or posterior oropharyngeal edema.  Eyes: Conjunctivae and lids are normal.  Neck: Carotid bruit is not present.  Cardiovascular: S1 normal and S2 normal. Exam reveals no  gallop.  No murmur heard. Pulses:      Dorsalis pedis pulses are 2+ on the right side and 2+ on the left side.  Respiratory: No respiratory distress. He has no wheezes. He has no rhonchi. He has no rales.  GI: Soft. Bowel sounds are normal. There is no abdominal tenderness.  Musculoskeletal:     Right ankle: No swelling.     Left ankle: No swelling.  Lymphadenopathy:    He has no cervical adenopathy.  Neurological: He is alert and oriented to person, place, and time. No cranial nerve deficit.  Some twitching of his upper extremities  Skin: Skin is warm. No rash noted. Nails show no clubbing.  Psychiatric: He has a normal mood and affect.      Data Reviewed: Basic Metabolic Panel: Recent Labs  Lab 10/30/19 0327 10/31/19 0349  NA 140 143  K 3.4* 3.2*  CL 104 105  CO2 20* 27  GLUCOSE 161* 126*  BUN 16 14  CREATININE 1.64* 1.46*  CALCIUM 9.6 9.2  MG 2.0 1.7  PHOS  --  3.6   Liver Function Tests: Recent Labs  Lab 10/30/19 0327 10/31/19 0349  AST 134* 71*  ALT 141* 126*  ALKPHOS 64 59  BILITOT 1.2 1.1  PROT 8.8* 6.9  ALBUMIN 5.2* 4.3   Recent Labs  Lab 10/30/19 1055  LIPASE 21   CBC: Recent Labs  Lab 10/30/19 0327 10/31/19 0857  WBC 20.2* 10.7*  NEUTROABS 15.6*  --  HGB 15.0 11.9*  HCT 43.2 35.5*  MCV 77.8* 79.4*  PLT 462* 316     Recent Results (from the past 240 hour(s))  Respiratory Panel by RT PCR (Flu A&B, Covid) - Nasopharyngeal Swab     Status: None   Collection Time: 10/30/19  6:46 AM   Specimen: Nasopharyngeal Swab  Result Value Ref Range Status   SARS Coronavirus 2 by RT PCR NEGATIVE NEGATIVE Final    Comment: (NOTE) SARS-CoV-2 target nucleic acids are NOT DETECTED. The SARS-CoV-2 RNA is generally detectable in upper respiratoy specimens during the acute phase of infection. The lowest concentration of SARS-CoV-2 viral copies this assay can detect is 131 copies/mL. A negative result does not preclude SARS-Cov-2 infection and should  not be used as the sole basis for treatment or other patient management decisions. A negative result may occur with  improper specimen collection/handling, submission of specimen other than nasopharyngeal swab, presence of viral mutation(s) within the areas targeted by this assay, and inadequate number of viral copies (<131 copies/mL). A negative result must be combined with clinical observations, patient history, and epidemiological information. The expected result is Negative. Fact Sheet for Patients:  PinkCheek.be Fact Sheet for Healthcare Providers:  GravelBags.it This test is not yet ap proved or cleared by the [place] FDA and  has been authorized for detection and/or diagnosis of SARS-CoV-2 by FDA under an Emergency Use Authorization (EUA). This EUA will remain  in effect (meaning this test can be used) for the duration of the COVID-19 declaration under Section 564(b)(1) of the Act, 21 U.S.C. section 360bbb-3(b)(1), unless the authorization is terminated or revoked sooner.    Influenza A by PCR NEGATIVE NEGATIVE Final   Influenza B by PCR NEGATIVE NEGATIVE Final    Comment: (NOTE) The Xpert Xpress SARS-CoV-2/FLU/RSV assay is intended as an aid in  the diagnosis of influenza from Nasopharyngeal swab specimens and  should not be used as a sole basis for treatment. Nasal washings and  aspirates are unacceptable for Xpert Xpress SARS-CoV-2/FLU/RSV  testing. Fact Sheet for Patients: PinkCheek.be Fact Sheet for Healthcare Providers: GravelBags.it This test is not yet approved or cleared by the [place] FDA and  has been authorized for detection and/or diagnosis of SARS-CoV-2 by  FDA under an Emergency Use Authorization (EUA). This EUA will remain  in effect (meaning this test can be used) for the duration of the  Covid-19 declaration under Section 564(b)(1)  of the Act, 21  U.S.C. section 360bbb-3(b)(1), unless the authorization is  terminated or revoked. Performed at [facility], [address] [place], [place] [address]   CULTURE, BLOOD (ROUTINE X 2) w Reflex to ID Panel     Status: None (Preliminary result)   Collection Time: 10/30/19 10:55 AM   Specimen: BLOOD  Result Value Ref Range Status   Specimen Description BLOOD RIGHT ANTECUBITAL  Final   Special Requests   Final    BOTTLES DRAWN AEROBIC AND ANAEROBIC Blood Culture adequate volume   Culture   Final    NO GROWTH < 24 HOURS Performed at [facility], [address] [place], [place] [address]    Report Status PENDING  Incomplete  CULTURE, BLOOD (ROUTINE X 2) w Reflex to ID Panel     Status: None (Preliminary result)   Collection Time: 10/30/19 10:55 AM   Specimen: BLOOD  Result Value Ref Range Status   Specimen Description BLOOD RIGHT ANTECUBITAL  Final   Special Requests   Final  BOTTLES DRAWN AEROBIC AND ANAEROBIC Blood Culture adequate volume   Culture   Final    NO GROWTH < 24 HOURS Performed at [facility], [address] [place], [place] [address]    Report Status PENDING  Incomplete  MRSA PCR Screening     Status: None   Collection Time: 10/31/19  1:14 AM   Specimen: Nasal Mucosa; Nasopharyngeal  Result Value Ref Range Status   MRSA by PCR NEGATIVE NEGATIVE Final    Comment:        The GeneXpert MRSA Assay (FDA approved for NASAL specimens only), is one component of a comprehensive MRSA colonization surveillance program. It is not intended to diagnose MRSA infection nor to guide or monitor treatment for MRSA infections. Performed at [facility], [address] [place], [place] [address]      Scheduled Meds: . [START ON 11/01/2019] amLODipine  10 mg Oral Daily  . amLODipine  5 mg Oral Once  . heparin  5,000 Units Subcutaneous Q8H  . [START ON 11/01/2019] influenza vaccine adjuvanted  0.5 mL Intramuscular  Tomorrow-1000  . pantoprazole  40 mg Oral Q1200  . traZODone  200 mg Oral QHS   Continuous Infusions: . sodium chloride      Assessment/Plan:  1. Drug overdose and suicide attempt with Benadryl, losartan and Tylenol.  Case discussed with pharmacist and patient was on 17 hours of acetylcysteine and we will discontinue this drip.  Also discontinue bicarb drip.  Seen by psychiatry yesterday and they recommended inpatient psychiatric follow-up.  Case discussed with psychiatry and hopefully will go to the behavioral health unit tomorrow morning. 2. Accelerated hypertension this morning.  Restart Norvasc 10 mg.  Continue to hold losartan since he took 60 pills of this. 3. Acute kidney injury.  Change IV fluids to normal saline 4. Elevated liver function test secondary to overdose.  Trending in the right direction recheck tomorrow morning. 5. Hypokalemia and hypomagnesemia replace potassium and magnesium today.  Magnesium given IV and potassium orally. 6. Leukocytosis reactive in nature 7. Major depressive disorder and insomnia.  Restart trazodone at night.  Code Status:     Code Status Orders  (From admission, onward)         Start     Ordered   10/30/19 0920  Full code  Continuous     10/30/19 0919        Code Status History    Date Active Date Inactive Code Status Order ID Comments User Context   03/30/2019 [id number] 04/03/2019 1846 Full Code [id number]  Patrecia Pour, NP Inpatient   03/17/2019 0949 03/30/2019 1517 Full Code [id number]  Angela Adam, RN Inpatient   03/11/2019 0031 03/12/2019 1021 DNR [id number]  [name], NP Inpatient   03/09/2019 0245 03/11/2019 0031 Full Code [id number]  Lance Coon, MD Inpatient   Advance Care Planning Activity     Family Communication: Spoke with patient's mother on the phone and gave her the information of her son's room so she called into the room. Disposition Plan: We will discharge tomorrow morning to psychiatry  floor.  Consultants:  Psychiatry  Time spent: 28 minutes  [place]

## 2019-10-31 NOTE — Progress Notes (Signed)
Patient is being transferred to [facility] room 213.  No concerns at this time.  Glenn Rasmussen from Poison control called for update on patient. Request for labs entered.  Report given to Almyra Free, [place].  Will continue to monitor.

## 2019-10-31 NOTE — Progress Notes (Signed)
Patient received from ED.  Patient IVC.  Alert and oriented x 4. Sitting in bed.  Able to make needs known.  C/o headache 8/10.  PRN pain med given for headache. Patient changed into hospital gown. No acute distress noted.  Sitter at bedside. Suicide precautions followed.  Will continue to monitor.

## 2019-10-31 NOTE — ED Notes (Signed)
Report given to Tonya, RN.

## 2019-11-01 DIAGNOSIS — T50902D Poisoning by unspecified drugs, medicaments and biological substances, intentional self-harm, subsequent encounter: Secondary | ICD-10-CM

## 2019-11-01 DIAGNOSIS — R519 Headache, unspecified: Secondary | ICD-10-CM

## 2019-11-01 LAB — CBC
HCT: 35.7 % — ABNORMAL LOW (ref 39.0–52.0)
Hemoglobin: 11.6 g/dL — ABNORMAL LOW (ref 13.0–17.0)
MCH: 26.7 pg (ref 26.0–34.0)
MCHC: 32.5 g/dL (ref 30.0–36.0)
MCV: 82.1 fL (ref 80.0–100.0)
Platelets: 302 10*3/uL (ref 150–400)
RBC: 4.35 MIL/uL (ref 4.22–5.81)
RDW: 13.3 % (ref 11.5–15.5)
WBC: 7 10*3/uL (ref 4.0–10.5)
nRBC: 0 % (ref 0.0–0.2)

## 2019-11-01 LAB — COMPREHENSIVE METABOLIC PANEL
ALT: 93 U/L — ABNORMAL HIGH (ref 0–44)
AST: 30 U/L (ref 15–41)
Albumin: 4.5 g/dL (ref 3.5–5.0)
Alkaline Phosphatase: 55 U/L (ref 38–126)
Anion gap: 9 (ref 5–15)
BUN: 11 mg/dL (ref 6–20)
CO2: 26 mmol/L (ref 22–32)
Calcium: 9.2 mg/dL (ref 8.9–10.3)
Chloride: 105 mmol/L (ref 98–111)
Creatinine, Ser: 0.84 mg/dL (ref 0.61–1.24)
GFR calc Af Amer: >60 mL/min (ref >60)
GFR calc non Af Amer: >60 mL/min (ref >60)
Glucose, Bld: 123 mg/dL — ABNORMAL HIGH (ref 70–99)
Potassium: 3.5 mmol/L (ref 3.5–5.1)
Sodium: 140 mmol/L (ref 135–145)
Total Bilirubin: 0.7 mg/dL (ref 0.3–1.2)
Total Protein: 7 g/dL (ref 6.5–8.1)

## 2019-11-01 LAB — MAGNESIUM: Magnesium: 2.3 mg/dL (ref 1.7–2.4)

## 2019-11-01 LAB — GLUCOSE, CAPILLARY: Glucose-Capillary: 104 mg/dL — ABNORMAL HIGH (ref 70–99)

## 2019-11-01 MED ORDER — MAGNESIUM SULFATE 2 GM/50ML IV SOLN
2.0000 g | Freq: Once | INTRAVENOUS | Status: AC
  Administered 2019-11-01: 12:00:00 2 g via INTRAVENOUS
  Filled 2019-11-01: qty 50

## 2019-11-01 MED ORDER — LABETALOL HCL 100 MG PO TABS
100.0000 mg | ORAL_TABLET | Freq: Two times a day (BID) | ORAL | Status: DC
  Administered 2019-11-01 – 2019-11-02 (×3): 100 mg via ORAL
  Filled 2019-11-01 (×4): qty 1

## 2019-11-01 MED ORDER — DEXAMETHASONE SODIUM PHOSPHATE 10 MG/ML IJ SOLN
10.0000 mg | Freq: Once | INTRAMUSCULAR | Status: AC
  Administered 2019-11-01: 12:00:00 10 mg via INTRAVENOUS
  Filled 2019-11-01: qty 1

## 2019-11-01 MED ORDER — PROMETHAZINE HCL 25 MG/ML IJ SOLN
12.5000 mg | Freq: Once | INTRAMUSCULAR | Status: AC
  Administered 2019-11-01: 12:00:00 12.5 mg via INTRAVENOUS
  Filled 2019-11-01: qty 1

## 2019-11-01 MED ORDER — VALPROATE SODIUM 500 MG/5ML IV SOLN
500.0000 mg | Freq: Once | INTRAVENOUS | Status: AC
  Administered 2019-11-01: 14:00:00 500 mg via INTRAVENOUS
  Filled 2019-11-01: qty 5

## 2019-11-01 MED ORDER — IBUPROFEN 400 MG PO TABS: 600.0000 mg | ORAL_TABLET | Freq: Four times a day (QID) | ORAL | Status: DC | PRN

## 2019-11-01 MED ORDER — POTASSIUM CHLORIDE CRYS ER 20 MEQ PO TBCR
40.0000 meq | EXTENDED_RELEASE_TABLET | Freq: Once | ORAL | Status: AC
  Administered 2019-11-01: 40 meq via ORAL
  Filled 2019-11-01: qty 2

## 2019-11-01 NOTE — Progress Notes (Signed)
Patient ID: ASSANTE LAPRAIRIE, male   DOB: 10/06/1991, 29 y.o.   MRN: [id number] [facility] PROGRESS NOTE  MORSE FRADETTE [id number] DOB: 1991-01-10 DOA: 10/30/2019 PCP: Maeola Sarah, MD  HPI/Subjective: Patient vomited this morning.  He has been having headaches since 2 in the morning.  He states once he gets a headache sometimes it takes days for it to get better.  Objective: Vitals:   11/01/19 1151 11/01/19 1433  BP: (!) 138/95 123/81  Pulse: 84 75  Resp: 15   Temp: 98.3 F (36.8 C)   SpO2: 95%     Intake/Output Summary (Last 24 hours) at 11/01/2019 1446 Last data filed at 11/01/2019 1325 Gross per 24 hour  Intake 1279.82 ml  Output 8 ml  Net 1271.82 ml   Filed Weights   10/30/19 0324 10/31/19 0000  Weight: 90.7 kg 97.8 kg    ROS: Review of Systems  Constitutional: Negative for chills and fever.  Eyes: Negative for blurred vision.  Respiratory: Negative for cough and shortness of breath.   Cardiovascular: Negative for chest pain.  Gastrointestinal: Negative for abdominal pain, constipation, diarrhea, nausea and vomiting.  Genitourinary: Negative for dysuria.  Musculoskeletal: Negative for joint pain.  Neurological: Positive for tremors and headaches. Negative for dizziness.   Exam: Physical Exam  Constitutional: He is oriented to person, place, and time.  HENT:  Nose: No mucosal edema.  Mouth/Throat: No oropharyngeal exudate or posterior oropharyngeal edema.  Eyes: Conjunctivae and lids are normal.  Neck: Carotid bruit is not present.  Cardiovascular: S1 normal and S2 normal. Exam reveals no gallop.  No murmur heard. Pulses:      Dorsalis pedis pulses are 2+ on the right side and 2+ on the left side.  Respiratory: No respiratory distress. He has no wheezes. He has no rhonchi. He has no rales.  GI: Soft. Bowel sounds are normal. There is no abdominal tenderness.  Musculoskeletal:     Right ankle: No swelling.     Left ankle: No swelling.   Lymphadenopathy:    He has no cervical adenopathy.  Neurological: He is alert and oriented to person, place, and time. No cranial nerve deficit.  Some twitching of his upper extremities  Skin: Skin is warm. No rash noted. Nails show no clubbing.  Psychiatric: He has a normal mood and affect.      Data Reviewed: Basic Metabolic Panel: Recent Labs  Lab 10/30/19 0327 10/31/19 0349 11/01/19 0426  NA 140 143 140  K 3.4* 3.2* 3.5  CL 104 105 105  CO2 20* 27 26  GLUCOSE 161* 126* 123*  BUN 16 14 11   CREATININE 1.64* 1.46* 0.84  CALCIUM 9.6 9.2 9.2  MG 2.0 1.7 2.3  PHOS  --  3.6  --    Liver Function Tests: Recent Labs  Lab 10/30/19 0327 10/31/19 0349 11/01/19 0426  AST 134* 71* 30  ALT 141* 126* 93*  ALKPHOS 64 59 55  BILITOT 1.2 1.1 0.7  PROT 8.8* 6.9 7.0  ALBUMIN 5.2* 4.3 4.5   Recent Labs  Lab 10/30/19 1055  LIPASE 21   CBC: Recent Labs  Lab 10/30/19 0327 10/31/19 0857 11/01/19 0426  WBC 20.2* 10.7* 7.0  NEUTROABS 15.6*  --   --   HGB 15.0 11.9* 11.6*  HCT 43.2 35.5* 35.7*  MCV 77.8* 79.4* 82.1  PLT 462* 316 302     Recent Results (from the past 240 hour(s))  Respiratory Panel by RT PCR (Flu A&B, Covid) - Nasopharyngeal Swab  Status: None   Collection Time: 10/30/19  6:46 AM   Specimen: Nasopharyngeal Swab  Result Value Ref Range Status   SARS Coronavirus 2 by RT PCR NEGATIVE NEGATIVE Final    Comment: (NOTE) SARS-CoV-2 target nucleic acids are NOT DETECTED. The SARS-CoV-2 RNA is generally detectable in upper respiratoy specimens during the acute phase of infection. The lowest concentration of SARS-CoV-2 viral copies this assay can detect is 131 copies/mL. A negative result does not preclude SARS-Cov-2 infection and should not be used as the sole basis for treatment or other patient management decisions. A negative result may occur with  improper specimen collection/handling, submission of specimen other than nasopharyngeal swab, presence  of viral mutation(s) within the areas targeted by this assay, and inadequate number of viral copies (<131 copies/mL). A negative result must be combined with clinical observations, patient history, and epidemiological information. The expected result is Negative. Fact Sheet for Patients:  PinkCheek.be Fact Sheet for Healthcare Providers:  GravelBags.it This test is not yet ap proved or cleared by the [place] FDA and  has been authorized for detection and/or diagnosis of SARS-CoV-2 by FDA under an Emergency Use Authorization (EUA). This EUA will remain  in effect (meaning this test can be used) for the duration of the COVID-19 declaration under Section 564(b)(1) of the Act, 21 U.S.C. section 360bbb-3(b)(1), unless the authorization is terminated or revoked sooner.    Influenza A by PCR NEGATIVE NEGATIVE Final   Influenza B by PCR NEGATIVE NEGATIVE Final    Comment: (NOTE) The Xpert Xpress SARS-CoV-2/FLU/RSV assay is intended as an aid in  the diagnosis of influenza from Nasopharyngeal swab specimens and  should not be used as a sole basis for treatment. Nasal washings and  aspirates are unacceptable for Xpert Xpress SARS-CoV-2/FLU/RSV  testing. Fact Sheet for Patients: PinkCheek.be Fact Sheet for Healthcare Providers: GravelBags.it This test is not yet approved or cleared by the [place] FDA and  has been authorized for detection and/or diagnosis of SARS-CoV-2 by  FDA under an Emergency Use Authorization (EUA). This EUA will remain  in effect (meaning this test can be used) for the duration of the  Covid-19 declaration under Section 564(b)(1) of the Act, 21  U.S.C. section 360bbb-3(b)(1), unless the authorization is  terminated or revoked. Performed at [facility], [address] [place], [place] [address]   CULTURE, BLOOD (ROUTINE X 2) w  Reflex to ID Panel     Status: None (Preliminary result)   Collection Time: 10/30/19 10:55 AM   Specimen: BLOOD  Result Value Ref Range Status   Specimen Description BLOOD RIGHT ANTECUBITAL  Final   Special Requests   Final    BOTTLES DRAWN AEROBIC AND ANAEROBIC Blood Culture adequate volume   Culture   Final    NO GROWTH 2 DAYS Performed at [facility], [address] [place], [place] [address]    Report Status PENDING  Incomplete  CULTURE, BLOOD (ROUTINE X 2) w Reflex to ID Panel     Status: None (Preliminary result)   Collection Time: 10/30/19 10:55 AM   Specimen: BLOOD  Result Value Ref Range Status   Specimen Description BLOOD RIGHT ANTECUBITAL  Final   Special Requests   Final    BOTTLES DRAWN AEROBIC AND ANAEROBIC Blood Culture adequate volume   Culture   Final    NO GROWTH 2 DAYS Performed at [facility], [address], [place], [place] [address]    Report Status PENDING  Incomplete  MRSA PCR Screening     Status: None   Collection Time: 10/31/19  1:14 AM   Specimen: Nasal Mucosa; Nasopharyngeal  Result Value Ref Range Status   MRSA by PCR NEGATIVE NEGATIVE Final    Comment:        The GeneXpert MRSA Assay (FDA approved for NASAL specimens only), is one component of a comprehensive MRSA colonization surveillance program. It is not intended to diagnose MRSA infection nor to guide or monitor treatment for MRSA infections. Performed at [facility], [address] [place], [place] [address]      Scheduled Meds: . amLODipine  10 mg Oral Daily  . heparin  5,000 Units Subcutaneous Q8H  . influenza vaccine adjuvanted  0.5 mL Intramuscular Tomorrow-1000  . labetalol  100 mg Oral BID  . pantoprazole  40 mg Oral Q1200  . traZODone  200 mg Oral QHS   Continuous Infusions: . valproate sodium 500 mg (11/01/19 1406)    Assessment/Plan:  1. Headache, acute intractable.  Ordered IV magnesium Decadron and Phenergan.  Headache still  going on so I ordered IV Depakote.  As needed ibuprofen.   2. Drug overdose and suicide attempt with Benadryl, losartan and Tylenol.   3. Accelerated hypertension this morning.  I added labetalol 100 mg twice a day and Norvasc.  Blood pressure better this afternoon than this morning. 4. Acute kidney injury.  This has resolved IV fluids stopped. 5. Elevated liver function test secondary to overdose.  AST normal range.  ALT trending better. 6. Hypokalemia and hypomagnesemia.  Magnesium replaced in the normal range.  Potassium low normal range give oral supplementation today 7. Leukocytosis reactive in nature 8. Major depressive disorder and insomnia.  Continue trazodone at night.  Code Status:     Code Status Orders  (From admission, onward)         Start     Ordered   10/30/19 0920  Full code  Continuous     10/30/19 0919        Code Status History    Date Active Date Inactive Code Status Order ID Comments User Context   03/30/2019 [id number] 04/03/2019 1846 Full Code [id number]  Patrecia Pour, NP Inpatient   03/17/2019 0949 03/30/2019 1517 Full Code [id number]  Angela Adam, RN Inpatient   03/11/2019 0031 03/12/2019 1021 DNR [id number]  [name], NP Inpatient   03/09/2019 0245 03/11/2019 0031 Full Code [id number]  Lance Coon, MD Inpatient   Advance Care Planning Activity     Family Communication: Spoke with patient's mother on the phone. Disposition Plan: Since still having headache, I will watch on the medicine floor today and hopefully discharge to psychiatry floor tomorrow.  Consultants:  Psychiatry  Time spent: 28 minutes  [place]

## 2019-11-02 ENCOUNTER — Encounter: Payer: Self-pay | Admitting: Psychiatry

## 2019-11-02 ENCOUNTER — Inpatient Hospital Stay
Admission: RE | Admit: 2019-11-02 | Discharge: 2019-11-05 | DRG: 885 | Disposition: A | Discharge status: Home / Self Care | Payer: Self-pay | Source: Intra-hospital | Attending: Psychiatry | Admitting: Psychiatry

## 2019-11-02 ENCOUNTER — Other Ambulatory Visit: Payer: Self-pay

## 2019-11-02 DIAGNOSIS — F419 Anxiety disorder, unspecified: Secondary | ICD-10-CM | POA: Diagnosis present

## 2019-11-02 DIAGNOSIS — T1491XA Suicide attempt, initial encounter: Secondary | ICD-10-CM | POA: Diagnosis present

## 2019-11-02 DIAGNOSIS — F332 Major depressive disorder, recurrent severe without psychotic features: Principal | ICD-10-CM | POA: Diagnosis present

## 2019-11-02 DIAGNOSIS — K589 Irritable bowel syndrome without diarrhea: Secondary | ICD-10-CM | POA: Diagnosis present

## 2019-11-02 DIAGNOSIS — K219 Gastro-esophageal reflux disease without esophagitis: Secondary | ICD-10-CM | POA: Diagnosis present

## 2019-11-02 DIAGNOSIS — Z801 Family history of malignant neoplasm of trachea, bronchus and lung: Secondary | ICD-10-CM

## 2019-11-02 DIAGNOSIS — G43909 Migraine, unspecified, not intractable, without status migrainosus: Secondary | ICD-10-CM | POA: Diagnosis present

## 2019-11-02 DIAGNOSIS — I10 Essential (primary) hypertension: Secondary | ICD-10-CM | POA: Diagnosis present

## 2019-11-02 MED ORDER — LABETALOL HCL 100 MG PO TABS: 100.0000 mg | ORAL_TABLET | Freq: Two times a day (BID) | ORAL | Status: DC

## 2019-11-02 MED ORDER — VENLAFAXINE HCL ER 75 MG PO CP24
150.0000 mg | ORAL_CAPSULE | Freq: Every day | ORAL | Status: DC
  Administered 2019-11-03 – 2019-11-05 (×3): 150 mg via ORAL
  Filled 2019-11-02 (×3): qty 2

## 2019-11-02 MED ORDER — IBUPROFEN 600 MG PO TABS: 600.0000 mg | ORAL_TABLET | Freq: Four times a day (QID) | ORAL | 0 refills | Status: DC | PRN

## 2019-11-02 MED ORDER — ACETAMINOPHEN 325 MG PO TABS: 650.0000 mg | ORAL_TABLET | Freq: Four times a day (QID) | ORAL | Status: DC | PRN

## 2019-11-02 MED ORDER — AMLODIPINE BESYLATE 10 MG PO TABS: 10.0000 mg | ORAL_TABLET | Freq: Every day | ORAL | Status: DC

## 2019-11-02 MED ORDER — TRAZODONE HCL 100 MG PO TABS
200.0000 mg | ORAL_TABLET | Freq: Every day | ORAL | Status: DC
  Administered 2019-11-02 – 2019-11-04 (×3): 200 mg via ORAL
  Filled 2019-11-02 (×3): qty 2

## 2019-11-02 MED ORDER — CLONAZEPAM 0.5 MG PO TABS
0.5000 mg | ORAL_TABLET | Freq: Two times a day (BID) | ORAL | Status: DC | PRN
  Administered 2019-11-02 – 2019-11-04 (×4): 0.5 mg via ORAL
  Filled 2019-11-02 (×4): qty 1

## 2019-11-02 MED ORDER — PANTOPRAZOLE SODIUM 40 MG PO TBEC
40.0000 mg | DELAYED_RELEASE_TABLET | Freq: Every day | ORAL | Status: DC
  Administered 2019-11-02 – 2019-11-05 (×4): 40 mg via ORAL
  Filled 2019-11-02 (×4): qty 1

## 2019-11-02 MED ORDER — MAGNESIUM HYDROXIDE 400 MG/5ML PO SUSP: 30.0000 mL | Freq: Every day | ORAL | Status: DC | PRN

## 2019-11-02 MED ORDER — ALUM & MAG HYDROXIDE-SIMETH 200-200-20 MG/5ML PO SUSP: 30.0000 mL | ORAL | Status: DC | PRN

## 2019-11-02 NOTE — Progress Notes (Signed)
Patient admitted from medical floor after an overdose on Losartin and Tylenol.Patient  is sad and tearful but cooperative during admission assessment.Patient stated that he could not get a job because of his legal issues.Patient was not compliant with his medications because he could not afford it. Patient denies SI/HI at this time. Patient denies AVH. Patient informed of fall risk status, fall risk assessed "low" at this time. Patient oriented to unit/staff/room. Patient denies any questions/concerns at this time. Patient safe on unit with Q15 minute checks for safety. Skin assessment and body search done no contraband found.

## 2019-11-02 NOTE — BH Assessment (Signed)
Patient is to be admitted to [facility] BMU by Dr. Ainsley Spinner.  Attending Physician will be Dr. Weber Cooks.   Patient has been assigned to room 310, by [facility] Charge Nurse [place].

## 2019-11-02 NOTE — Progress Notes (Signed)
11/02/2019 3:09 PM  Called report to receiving RN in [place] health unit.  Dola Argyle, RN

## 2019-11-02 NOTE — Tx Team (Signed)
Initial Treatment Plan 11/02/2019 6:25 PM Glenn Rasmussen [id number]    PATIENT STRESSORS: Financial difficulties Legal issue Medication change or noncompliance   PATIENT STRENGTHS: Ability for insight Communication skills Physical Health Work skills   PATIENT IDENTIFIED PROBLEMS: Depression 11/02/2019  Hopelessness 11/02/2019                   DISCHARGE CRITERIA:  Ability to meet basic life and health needs Improved stabilization in mood, thinking, and/or behavior Medical problems require only outpatient monitoring Verbal commitment to aftercare and medication compliance  PRELIMINARY DISCHARGE PLAN: Attend aftercare/continuing care group Return to previous living arrangement  PATIENT/FAMILY INVOLVEMENT: This treatment plan has been presented to and reviewed with the patient, Glenn Rasmussen, and/or family member,.  The patient and family have been given the opportunity to ask questions and make suggestions.  Merlene Morse, RN 11/02/2019, 6:25 PM

## 2019-11-02 NOTE — Discharge Summary (Signed)
Glenn Rasmussen at [place] NAME: Glenn Rasmussen    MR#:  [id number]  DATE OF BIRTH:  [birth year]  DATE OF ADMISSION:  10/30/2019 ADMITTING PHYSICIAN: Glenn Costa, MD  DATE OF DISCHARGE: 11/02/2019  PRIMARY CARE PHYSICIAN: Glenn Sarah, MD    ADMISSION DIAGNOSIS:  Suicide attempt ([place]) [T14.91XA] Multiple drug overdose [T50.911A] AKI (acute kidney injury) ([place]) [N17.9] Intentional overdose of drug in tablet form ([place]) [T50.902A]  DISCHARGE DIAGNOSIS:  Principal Problem:   Intentional drug overdose ([place]) Active Problems:   Suicide attempt ([place])   Accelerated hypertension   AKI (acute kidney injury) ([place])   MDD (major depressive disorder), recurrent severe, without psychosis ([place])   Hypokalemia   Leukocytosis   Abnormal LFTs   Migraine headache   GERD (gastroesophageal reflux disease)   Bipolar 1 disorder ([place])   Hypomagnesemia   Acute intractable headache   SECONDARY DIAGNOSIS:   Past Medical History:  Diagnosis Date  . Anxiety   . Hypertension   . IBS (irritable bowel syndrome)   . Migraines     HOSPITAL COURSE:   1.  Drug overdose and suicide attempt.  Patient took 10 tabs Benadryl 50 mg, 12 tabs of Tylenol 500 mg and 60 losartan tablets.  Patient was on 17 hours of acetylcysteine and that was discontinued.  Patient was on bicarb and that was discontinued.  Patient was seen by psychiatry and they recommended inpatient psychiatric follow-up once medically cleared.  Patient medically cleared to go to the psychiatric floor. 2.  Acute intractable headache yesterday with vomiting.  I gave the patient IV magnesium, IV Decadron and Phenergan.  The patient was still having headache so I did give a dose of IV Depakote.  Headache today is much better.  Can use ibuprofen as needed if needed. 3.  Accelerated blood pressure on presentation.  Losartan was held secondary to overdose and acute kidney injury.  Restarted the patient's Norvasc 10 mg  daily and started low-dose labetalol 100 mg twice daily.  Blood pressure much better this morning. 4.  Acute kidney injury.  This has resolved.  Creatinine was 1.64 on presentation down to 0.84 upon discharge to psychiatry floor. 5.  Hypokalemia and hypomagnesemia.  Both of these electrolytes were replaced into the normal range. 6.  Leukocytosis reactive in nature. 7.  Major depressive disorder and insomnia.  I did order the trazodone at night.  Follow-up with psychiatry team other medications. 8.  Tremor and twitching a little bit better with starting beta-blocker. 9.  Elevated liver function tests trending better.   DISCHARGE CONDITIONS:   [place]  CONSULTS OBTAINED:  Psychiatry  DRUG ALLERGIES:  No Known Allergies  DISCHARGE MEDICATIONS:   Allergies as of 11/02/2019   No Known Allergies     Medication List    STOP taking these medications   losartan 100 MG tablet Commonly known as: COZAAR   venlafaxine XR 150 MG 24 hr capsule Commonly known as: EFFEXOR-XR     TAKE these medications   amLODipine 10 MG tablet Commonly known as: NORVASC Take 1 tablet (10 mg total) by mouth daily.   aspirin 325 MG EC tablet Take 1 tablet (325 mg total) by mouth daily.   ibuprofen 600 MG tablet Commonly known as: ADVIL Take 1 tablet (600 mg total) by mouth every 6 (six) hours as needed for headache or moderate pain.   labetalol 100 MG tablet Commonly known as: NORMODYNE Take 1 tablet (100 mg total) by mouth 2 (two)  times daily.   multivitamin with minerals Tabs tablet Take 1 tablet by mouth once a week.   traZODone 100 MG tablet Commonly known as: DESYREL Take 2 tablets (200 mg total) by mouth at bedtime.        DISCHARGE INSTRUCTIONS:   Follow-up with psychiatry team on the psychiatry floor 1 day  If you experience worsening of your admission symptoms, develop shortness of breath, life threatening emergency, suicidal or homicidal thoughts you must seek medical attention  immediately by calling 911 or calling your MD immediately  if symptoms less severe.  You Must read complete instructions/literature along with all the possible adverse reactions/side effects for all the Medicines you take and that have been prescribed to you. Take any new Medicines after you have completely understood and accept all the possible adverse reactions/side effects.   Please note  You were cared for by a hospitalist during your hospital stay. If you have any questions about your discharge medications or the care you received while you were in the hospital after you are discharged, you can call the unit and asked to speak with the hospitalist on call if the hospitalist that took care of you is not available. Once you are discharged, your primary care physician will handle any further medical issues. Please note that NO REFILLS for any discharge medications will be authorized once you are discharged, as it is imperative that you return to your primary care physician (or establish a relationship with a primary care physician if you do not have one) for your aftercare needs so that they can reassess your need for medications and monitor your lab values.    Today   CHIEF COMPLAINT:   Chief Complaint  Patient presents with  . Ingestion  . Suicidal    HISTORY OF PRESENT ILLNESS:  Glenn Rasmussen  is a 29 y.o. male came in with overdose that was intentional.   VITAL SIGNS:  Blood pressure 133/81, pulse 66, temperature 97.9 F (36.6 C), temperature source Oral, resp. rate 18, height 5\' 7"  (1.702 m), weight 97.8 kg, SpO2 95 %.  I/O:    Intake/Output Summary (Last 24 hours) at 11/02/2019 0904 Last data filed at 11/01/2019 1841 Gross per 24 hour  Intake 595 ml  Output --  Net 595 ml    PHYSICAL EXAMINATION:  GENERAL:  29 y.o.-year-old patient lying in the bed with no acute distress.  EYES: Pupils equal, round, reactive to light and accommodation. No scleral icterus. Extraocular  muscles intact.  HEENT: Head atraumatic, normocephalic. Oropharynx and nasopharynx clear.  NECK:  Supple, no jugular venous distention. No thyroid enlargement, no tenderness.  LUNGS: Normal breath sounds bilaterally, no wheezing, rales,rhonchi or crepitation. No use of accessory muscles of respiration.  CARDIOVASCULAR: S1, S2 normal. No murmurs, rubs, or gallops.  ABDOMEN: Soft, non-tender, non-distended. Bowel sounds present. No organomegaly or mass.  EXTREMITIES: No pedal edema, cyanosis, or clubbing.  NEUROLOGIC: Cranial nerves II through XII are intact. Muscle strength 5/5 in all extremities. Sensation intact. Gait not checked.  PSYCHIATRIC: The patient is alert and oriented x 3.  SKIN: No obvious rash, lesion, or ulcer.   DATA REVIEW:   CBC Recent Labs  Lab 11/01/19 0426  WBC 7.0  HGB 11.6*  HCT 35.7*  PLT 302    Chemistries  Recent Labs  Lab 11/01/19 0426  NA 140  K 3.5  CL 105  CO2 26  GLUCOSE 123*  BUN 11  CREATININE 0.84  CALCIUM 9.2  MG 2.3  AST 30  ALT 93*  ALKPHOS 55  BILITOT 0.7    Microbiology Results  Results for orders placed or performed during the hospital encounter of 10/30/19  Respiratory Panel by RT PCR (Flu A&B, Covid) - Nasopharyngeal Swab     Status: None   Collection Time: 10/30/19  6:46 AM   Specimen: Nasopharyngeal Swab  Result Value Ref Range Status   SARS Coronavirus 2 by RT PCR NEGATIVE NEGATIVE Final    Comment: (NOTE) SARS-CoV-2 target nucleic acids are NOT DETECTED. The SARS-CoV-2 RNA is generally detectable in upper respiratoy specimens during the acute phase of infection. The lowest concentration of SARS-CoV-2 viral copies this assay can detect is 131 copies/mL. A negative result does not preclude SARS-Cov-2 infection and should not be used as the sole basis for treatment or other patient management decisions. A negative result may occur with  improper specimen collection/handling, submission of specimen other than  nasopharyngeal swab, presence of viral mutation(s) within the areas targeted by this assay, and inadequate number of viral copies (<131 copies/mL). A negative result must be combined with clinical observations, patient history, and epidemiological information. The expected result is Negative. Fact Sheet for Patients:  PinkCheek.be Fact Sheet for Healthcare Providers:  GravelBags.it This test is not yet ap proved or cleared by the [place] FDA and  has been authorized for detection and/or diagnosis of SARS-CoV-2 by FDA under an Emergency Use Authorization (EUA). This EUA will remain  in effect (meaning this test can be used) for the duration of the COVID-19 declaration under Section 564(b)(1) of the Act, 21 U.S.C. section 360bbb-3(b)(1), unless the authorization is terminated or revoked sooner.    Influenza A by PCR NEGATIVE NEGATIVE Final   Influenza B by PCR NEGATIVE NEGATIVE Final    Comment: (NOTE) The Xpert Xpress SARS-CoV-2/FLU/RSV assay is intended as an aid in  the diagnosis of influenza from Nasopharyngeal swab specimens and  should not be used as a sole basis for treatment. Nasal washings and  aspirates are unacceptable for Xpert Xpress SARS-CoV-2/FLU/RSV  testing. Fact Sheet for Patients: PinkCheek.be Fact Sheet for Healthcare Providers: GravelBags.it This test is not yet approved or cleared by the [place] FDA and  has been authorized for detection and/or diagnosis of SARS-CoV-2 by  FDA under an Emergency Use Authorization (EUA). This EUA will remain  in effect (meaning this test can be used) for the duration of the  Covid-19 declaration under Section 564(b)(1) of the Act, 21  U.S.C. section 360bbb-3(b)(1), unless the authorization is  terminated or revoked. Performed at [facility], [address] [place], [place] [address]    CULTURE, BLOOD (ROUTINE X 2) w Reflex to ID Panel     Status: None (Preliminary result)   Collection Time: 10/30/19 10:55 AM   Specimen: BLOOD  Result Value Ref Range Status   Specimen Description BLOOD RIGHT ANTECUBITAL  Final   Special Requests   Final    BOTTLES DRAWN AEROBIC AND ANAEROBIC Blood Culture adequate volume   Culture   Final    NO GROWTH 3 DAYS Performed at [facility], [address], [place], [place] [address]    Report Status PENDING  Incomplete  CULTURE, BLOOD (ROUTINE X 2) w Reflex to ID Panel     Status: None (Preliminary result)   Collection Time: 10/30/19 10:55 AM   Specimen: BLOOD  Result Value Ref Range Status   Specimen Description BLOOD RIGHT ANTECUBITAL  Final   Special Requests   Final  BOTTLES DRAWN AEROBIC AND ANAEROBIC Blood Culture adequate volume   Culture   Final    NO GROWTH 3 DAYS Performed at [facility], [address] [place], [place] [address]    Report Status PENDING  Incomplete  MRSA PCR Screening     Status: None   Collection Time: 10/31/19  1:14 AM   Specimen: Nasal Mucosa; Nasopharyngeal  Result Value Ref Range Status   MRSA by PCR NEGATIVE NEGATIVE Final    Comment:        The GeneXpert MRSA Assay (FDA approved for NASAL specimens only), is one component of a comprehensive MRSA colonization surveillance program. It is not intended to diagnose MRSA infection nor to guide or monitor treatment for MRSA infections. Performed at [facility], [address], [place], [place] [address]      Management plans discussed with the patient, and he is in agreement.  Spoke with the patient's mother yesterday.  CODE STATUS:     Code Status Orders  (From admission, onward)         Start     Ordered   10/30/19 0920  Full code  Continuous     10/30/19 0919        Code Status History    Date Active Date Inactive Code Status Order ID Comments User Context   03/30/2019 [id number] 04/03/2019 1846 Full  Code [id number]  Patrecia Pour, NP Inpatient   03/17/2019 0949 03/30/2019 1517 Full Code [id number]  Angela Adam, RN Inpatient   03/11/2019 0031 03/12/2019 1021 DNR [id number]  [name], NP Inpatient   03/09/2019 0245 03/11/2019 0031 Full Code [id number]  Lance Coon, MD Inpatient   Advance Care Planning Activity      TOTAL TIME TAKING CARE OF THIS PATIENT: 35 minutes.    Loletha Grayer M.D on 11/02/2019 at 9:04 AM  Between 7am to 6pm - Pager - [phone]  After 6pm go to [email or web address] - password [email or web address]  [facility]  CC: Primary care physician; Glenn Sarah, MD

## 2019-11-02 NOTE — H&P (Signed)
Psychiatric Admission Assessment Adult  Patient Identification: Glenn Rasmussen MRN:  [id number] Date of Evaluation:  11/02/2019 Chief Complaint:  Severe recurrent major depression without psychotic features ([place]) [F33.2] Principal Diagnosis: Severe recurrent major depression without psychotic features ([place]) Diagnosis:  Principal Problem:   Severe recurrent major depression without psychotic features ([place]) Active Problems:   Suicide attempt ([place])   Migraine headache  History of Present Illness: Patient seen chart reviewed.  29 year old man with a history of recurrent depression.  Presented to the emergency room shortly after taking an overdose of multiple medications including losartan and acetaminophen.  Patient says that he had initially intended to take a much larger dose of acetaminophen knowing that it was potentially fatal but then chose not to take quite as many.  Called EMS.  Patient has been cooperative with treatment since coming into the hospital.  He reports that he has been depressed pretty consistently for months now.  The major life stressor is that he is unable to get a job.  He has some criminal charges against him which are still under investigation and have not been resolved and this seems to be a huge barrier to finding any sort of work.  Financially he is suffering obviously.  Patient's sleep is impaired unless he takes something to help with it.  No other specific new physical complaints.  Denies alcohol or drug abuse.  Denies psychotic symptoms denies any homicidal ideation.  Indicates intention to be cooperative with treatment.  He has been off of all of his medication probably at least since October because of being out of work and unable to afford it. Associated Signs/Symptoms: Depression Symptoms:  depressed mood, anhedonia, insomnia, suicidal attempt, (Hypo) Manic Symptoms:  Impulsivity, Anxiety Symptoms:  Excessive Worry, Psychotic Symptoms:  None reported PTSD  Symptoms: Negative Total Time spent with patient: 1 hour  Past Psychiatric History: Patient has had a prior admission in June of this year also for a serious suicide attempt.  He responded to antidepressant medication and has a history of doing well on antidepressant specifically on Effexor which had been used in the past.  No history of mania no history of substance abuse  Is the patient at risk to self? Yes.    Has the patient been a risk to self in the past 6 months? Yes.    Has the patient been a risk to self within the distant past? Yes.    Is the patient a risk to others? No.  Has the patient been a risk to others in the past 6 months? No.  Has the patient been a risk to others within the distant past? No.   Prior Inpatient Therapy:   Prior Outpatient Therapy:    Alcohol Screening: 1. How often do you have a drink containing alcohol?: Monthly or less 2. How many drinks containing alcohol do you have on a typical day when you are drinking?: 1 or 2 3. How often do you have six or more drinks on one occasion?: Never AUDIT-C Score: 1 4. How often during the last year have you found that you were not able to stop drinking once you had started?: Never 5. How often during the last year have you failed to do what was normally expected from you becasue of drinking?: Never 6. How often during the last year have you needed a first drink in the morning to get yourself going after a heavy drinking session?: Never 7. How often during the last year have you  had a feeling of guilt of remorse after drinking?: Never 8. How often during the last year have you been unable to remember what happened the night before because you had been drinking?: Never 9. Have you or someone else been injured as a result of your drinking?: No 10. Has a relative or friend or a doctor or another health worker been concerned about your drinking or suggested you cut down?: No Alcohol Use Disorder Identification Test Final  Score (AUDIT): 1 Alcohol Brief Interventions/Follow-up: AUDIT Score <7 follow-up not indicated Substance Abuse History in the last 12 months:  No. Consequences of Substance Abuse: Negative Previous Psychotropic Medications: Yes  Psychological Evaluations: Yes  Past Medical History:  Past Medical History:  Diagnosis Date  . Anxiety   . Hypertension   . IBS (irritable bowel syndrome)   . Migraines     Past Surgical History:  Procedure Laterality Date  . APPENDECTOMY    . CHOLECYSTECTOMY    . DIALYSIS/PERMA CATHETER INSERTION N/A 03/26/2019   Procedure: DIALYSIS/PERMA CATHETER INSERTION;  Surgeon: Algernon Huxley, MD;  Location: [facility];  Service: Cardiovascular;  Laterality: N/A;  . DIALYSIS/PERMA CATHETER REMOVAL N/A 04/03/2019   Procedure: DIALYSIS/PERMA CATHETER REMOVAL;  Surgeon: Algernon Huxley, MD;  Location: [facility];  Service: Cardiovascular;  Laterality: N/A;  . TONSILLECTOMY     Family History:  Family History  Problem Relation Age of Onset  . Lung cancer Mother   . Lung cancer Father    Family Psychiatric  History: None reported Tobacco Screening: Have you used any form of tobacco in the last 30 days? (Cigarettes, Smokeless Tobacco, Cigars, and/or Pipes): No Social History:  Social History   Substance and Sexual Activity  Alcohol Use No     Social History   Substance and Sexual Activity  Drug Use No    Additional Social History:                           Allergies:  No Known Allergies Lab Results:  Results for orders placed or performed during the hospital encounter of 10/30/19 (from the past 48 hour(s))  Magnesium     Status: None   Collection Time: 11/01/19  4:26 AM  Result Value Ref Range   Magnesium 2.3 1.7 - 2.4 mg/dL    Comment: Performed at [facility], [address] [place], [place] [address]  Comprehensive metabolic panel     Status: Abnormal   Collection Time: 11/01/19  4:26 AM  Result Value Ref Range    Sodium 140 135 - 145 mmol/L   Potassium 3.5 3.5 - 5.1 mmol/L   Chloride 105 98 - 111 mmol/L   CO2 26 22 - 32 mmol/L   Glucose, Bld 123 (H) 70 - 99 mg/dL   BUN 11 6 - 20 mg/dL   Creatinine, Ser 0.84 0.61 - 1.24 mg/dL   Calcium 9.2 8.9 - 10.3 mg/dL   Total Protein 7.0 6.5 - 8.1 g/dL   Albumin 4.5 3.5 - 5.0 g/dL   AST 30 15 - 41 U/L   ALT 93 (H) 0 - 44 U/L   Alkaline Phosphatase 55 38 - 126 U/L   Total Bilirubin 0.7 0.3 - 1.2 mg/dL   GFR calc non Af Amer >60 >60 mL/min   GFR calc Af Amer >60 >60 mL/min   Anion gap 9 5 - 15    Comment: Performed at [facility], [address] [place], [place] [address]  CBC     Status: Abnormal   Collection Time: 11/01/19  4:26 AM  Result Value Ref Range   WBC 7.0 4.0 - 10.5 K/uL   RBC 4.35 4.22 - 5.81 MIL/uL   Hemoglobin 11.6 (L) 13.0 - 17.0 g/dL   HCT 35.7 (L) 39.0 - 52.0 %   MCV 82.1 80.0 - 100.0 fL   MCH 26.7 26.0 - 34.0 pg   MCHC 32.5 30.0 - 36.0 g/dL   RDW 13.3 11.5 - 15.5 %   Platelets 302 150 - 400 K/uL   nRBC 0.0 0.0 - 0.2 %    Comment: Performed at [facility], [address] [place], [place] [address]    Blood Alcohol level:  Lab Results  Component Value Date   [facility] <10 10/30/2019   ETH <10 [id number]    Metabolic Disorder Labs:  Lab Results  Component Value Date   HGBA1C 5.5 03/13/2019   MPG 111 03/13/2019   No results found for: PROLACTIN Lab Results  Component Value Date   CHOL 154 03/13/2019   TRIG 263 (H) 03/23/2019   HDL 32 (L) 03/13/2019   CHOLHDL 4.8 03/13/2019   VLDL 52 (H) 03/13/2019   LDLCALC 70 03/13/2019    Current Medications: Current Facility-Administered Medications  Medication Dose Route Frequency Provider Last Rate Last Admin  . acetaminophen (TYLENOL) tablet 650 mg  650 mg Oral Q6H PRN Robson Trickey T, MD      . alum & mag hydroxide-simeth (MAALOX/MYLANTA) 200-200-20 MG/5ML suspension 30 mL  30 mL Oral Q4H PRN Chari Parmenter T, MD      . clonazePAM (KLONOPIN) tablet 0.5  mg  0.5 mg Oral BID PRN Taylin Mans T, MD      . magnesium hydroxide (MILK OF MAGNESIA) suspension 30 mL  30 mL Oral Daily PRN Sanders Manninen T, MD      . pantoprazole (PROTONIX) EC tablet 40 mg  40 mg Oral Daily Emran Molzahn T, MD      . traZODone (DESYREL) tablet 200 mg  200 mg Oral QHS Eloni Darius T, MD      . Derrill Memo ON 11/03/2019] venlafaxine XR (EFFEXOR-XR) 24 hr capsule 150 mg  150 mg Oral Q breakfast Aisha Greenberger T, MD       PTA Medications: Medications Prior to Admission  Medication Sig Dispense Refill Last Dose  . amLODipine (NORVASC) 10 MG tablet Take 1 tablet (10 mg total) by mouth daily.     Marland Kitchen aspirin 325 MG EC tablet Take 1 tablet (325 mg total) by mouth daily. 30 tablet 1   . ibuprofen (ADVIL) 600 MG tablet Take 1 tablet (600 mg total) by mouth every 6 (six) hours as needed for headache or moderate pain. 30 tablet 0   . labetalol (NORMODYNE) 100 MG tablet Take 1 tablet (100 mg total) by mouth 2 (two) times daily.     . Multiple Vitamin (MULTIVITAMIN WITH MINERALS) TABS tablet Take 1 tablet by mouth once a week.     . traZODone (DESYREL) 100 MG tablet Take 2 tablets (200 mg total) by mouth at bedtime. 60 tablet 1     Musculoskeletal: Strength & Muscle Tone: within normal limits Gait & Station: normal Patient leans: N/A  Psychiatric Specialty Exam: Physical Exam  Nursing note and vitals reviewed. Constitutional: He appears well-developed and well-nourished.  HENT:  Head: Normocephalic and atraumatic.  Eyes: Pupils are equal, round, and reactive to light. Conjunctivae are normal.  Cardiovascular:  Regular rhythm and normal heart sounds.  Respiratory: Effort normal. No respiratory distress.  GI: Soft.  Musculoskeletal:        General: Normal range of motion.     Cervical back: Normal range of motion.  Neurological: He is alert.  Skin: Skin is warm and dry.  Psychiatric: His speech is delayed. He is slowed. Cognition and memory are normal. He expresses impulsivity. He  exhibits a depressed mood. He expresses suicidal ideation. He expresses no suicidal plans.    Review of Systems  Constitutional: Negative.   HENT: Negative.   Eyes: Negative.   Respiratory: Negative.   Cardiovascular: Negative.   Gastrointestinal: Negative.   Musculoskeletal: Negative.   Skin: Negative.   Neurological: Negative.   Psychiatric/Behavioral: Positive for dysphoric mood and suicidal ideas.    Blood pressure (!) 140/98, pulse 98, temperature 99.2 F (37.3 C), temperature source Oral, resp. rate 18, height 5\' 7"  (1.702 m), weight 96.2 kg, SpO2 98 %.Body mass index is 33.2 kg/m.  General Appearance: Casual  Eye Contact:  Good  Speech:  Slow  Volume:  Decreased  Mood:  Depressed  Affect:  Constricted  Thought Process:  Coherent  Orientation:  Full (Time, Place, and Person)  Thought Content:  Logical  Suicidal Thoughts:  Yes.  without intent/plan  Homicidal Thoughts:  No  Memory:  Immediate;   Fair Recent;   Fair Remote;   Fair  Judgement:  Fair  Insight:  Fair  Psychomotor Activity:  Decreased  Concentration:  Concentration: Fair  Recall:  [employer] of Knowledge:  Fair  Language:  Fair  Akathisia:  No  Handed:  Right  AIMS (if indicated):     Assets:  Communication Skills Desire for Improvement Housing  ADL's:  Intact  Cognition:  WNL  Sleep:       Treatment Plan Summary: Daily contact with patient to assess and evaluate symptoms and progress in treatment, Medication management and Plan Reviewed past treatment with the patient.  Restart Effexor extended release 150 mg a day and trazodone 200 mg at night.  As needed clonazepam.  Encourage group attendance.  Full treatment team will do an assessment tomorrow.  We will work on arranging for appropriate outpatient treatment after discharge.  Observation Level/Precautions:  15 minute checks  Laboratory:  Chemistry Profile  Psychotherapy:    Medications:    Consultations:    Discharge Concerns:     Estimated LOS:  Other:     Physician Treatment Plan for Primary Diagnosis: Severe recurrent major depression without psychotic features ([place]) Long Term Goal(s): Improvement in symptoms so as ready for discharge  Short Term Goals: Ability to verbalize feelings will improve and Ability to disclose and discuss suicidal ideas  Physician Treatment Plan for Secondary Diagnosis: Principal Problem:   Severe recurrent major depression without psychotic features ([place]) Active Problems:   Suicide attempt ([place])   Migraine headache  Long Term Goal(s): Improvement in symptoms so as ready for discharge  Short Term Goals: Compliance with prescribed medications will improve  I certify that inpatient services furnished can reasonably be expected to improve the patient's condition.    Alethia Berthold, MD 1/25/20214:16 PM

## 2019-11-02 NOTE — BHH Suicide Risk Assessment (Signed)
[facility] Admission Suicide Risk Assessment   Nursing information obtained from:    Demographic factors:    Current Mental Status:    Loss Factors:    Historical Factors:    Risk Reduction Factors:     Total Time spent with patient: 1 hour Principal Problem: Severe recurrent major depression without psychotic features ([place]) Diagnosis:  Principal Problem:   Severe recurrent major depression without psychotic features ([place]) Active Problems:   Suicide attempt ([place])   Migraine headache  Subjective Data: Patient seen chart reviewed.  29 year old man with a history of recurrent depression came to the hospital after overdosing on multiple medicines including acetaminophen.  He has been medically cleared at this point.  Patient continues to be depressed very negative down on himself.  Passive suicidal thoughts but no intent or plan to act on it at this time.  Cooperative with treatment and agreeable to medication.  Not using substances recently.  Continued Clinical Symptoms:  Alcohol Use Disorder Identification Test Final Score (AUDIT): 1 The "Alcohol Use Disorders Identification Test", Guidelines for Use in Primary Care, Second Edition.  World Pharmacologist [facility]). Score between 0-7:  no or low risk or alcohol related problems. Score between 8-15:  moderate risk of alcohol related problems. Score between 16-19:  high risk of alcohol related problems. Score 20 or above:  warrants further diagnostic evaluation for alcohol dependence and treatment.   CLINICAL FACTORS:   Depression:   Anhedonia   Musculoskeletal: Strength & Muscle Tone: within normal limits Gait & Station: normal Patient leans: N/A  Psychiatric Specialty Exam: Physical Exam  Nursing note and vitals reviewed. Constitutional: He appears well-developed and well-nourished.  HENT:  Head: Normocephalic and atraumatic.  Eyes: Pupils are equal, round, and reactive to light. Conjunctivae are normal.  Cardiovascular: Regular  rhythm and normal heart sounds.  Respiratory: Effort normal. No respiratory distress.  GI: Soft.  Musculoskeletal:        General: Normal range of motion.     Cervical back: Normal range of motion.  Neurological: He is alert.  Skin: Skin is warm and dry.  Psychiatric: Judgment normal. His speech is delayed. He is slowed. Thought content is not paranoid. Cognition and memory are normal. He exhibits a depressed mood. He expresses suicidal ideation. He expresses no homicidal ideation. He expresses no suicidal plans.    Review of Systems  Constitutional: Negative.   HENT: Negative.   Eyes: Negative.   Respiratory: Negative.   Cardiovascular: Negative.   Gastrointestinal: Negative.   Musculoskeletal: Negative.   Skin: Negative.   Neurological: Negative.   Psychiatric/Behavioral: Positive for dysphoric mood and suicidal ideas.    Blood pressure (!) 140/98, pulse 98, temperature 99.2 F (37.3 C), temperature source Oral, resp. rate 18, height 5\' 7"  (1.702 m), weight 96.2 kg, SpO2 98 %.Body mass index is 33.2 kg/m.  General Appearance: Casual  Eye Contact:  Good  Speech:  Clear and Coherent  Volume:  Normal  Mood:  Depressed  Affect:  Constricted  Thought Process:  Coherent  Orientation:  Full (Time, Place, and Person)  Thought Content:  Logical  Suicidal Thoughts:  Yes.  without intent/plan  Homicidal Thoughts:  No  Memory:  Immediate;   Fair Recent;   Fair Remote;   Fair  Judgement:  Fair  Insight:  Fair  Psychomotor Activity:  Normal  Concentration:  Concentration: Fair  Recall:  [employer] of Knowledge:  Fair  Language:  Fair  Akathisia:  No  Handed:  Right  AIMS (if indicated):     Assets:  Desire for Improvement  ADL's:  Intact  Cognition:  WNL  Sleep:         COGNITIVE FEATURES THAT CONTRIBUTE TO RISK:  None    SUICIDE RISK:   Mild:  Suicidal ideation of limited frequency, intensity, duration, and specificity.  There are no identifiable plans, no associated  intent, mild dysphoria and related symptoms, good self-control (both objective and subjective assessment), few other risk factors, and identifiable protective factors, including available and accessible social support.  PLAN OF CARE: Continue 15-minute checks.  Restart medication for depression.  Included in group therapy.  Full assessment by treatment team.  Ongoing assessment of suicidality prior to discharge  I certify that inpatient services furnished can reasonably be expected to improve the patient's condition.   Alethia Berthold, MD 11/02/2019, 4:11 PM

## 2019-11-03 DIAGNOSIS — F332 Major depressive disorder, recurrent severe without psychotic features: Secondary | ICD-10-CM

## 2019-11-03 NOTE — BHH Group Notes (Signed)
LCSW Group Therapy Note  11/03/2019 2:07 PM  Type of Therapy/Topic:  Group Therapy:  Feelings about Diagnosis  Participation Level:  Active   Description of Group:   This group will allow patients to explore their thoughts and feelings about diagnoses they have received. Patients will be guided to explore their level of understanding and acceptance of these diagnoses. Facilitator will encourage patients to process their thoughts and feelings about the reactions of others to their diagnosis and will guide patients in identifying ways to discuss their diagnosis with significant others in their lives. This group will be process-oriented, with patients participating in exploration of their own experiences, giving and receiving support, and processing challenge from other group members.   Therapeutic Goals: 1. Patient will demonstrate understanding of diagnosis as evidenced by identifying two or more symptoms of the disorder 2. Patient will be able to express two feelings regarding the diagnosis 3. Patient will demonstrate their ability to communicate their needs through discussion and/or role play  Summary of Patient Progress: Patient was present in group.  Patient was an active participant and engaged in group discussions.  Patient ws supportive of other group members.    Therapeutic Modalities:   Cognitive Behavioral Therapy Brief Therapy Feelings Identification   Assunta Curtis, MSW, LCSW 11/03/2019 2:07 PM

## 2019-11-03 NOTE — Plan of Care (Signed)
Newly admitted. Stayed in the milieu until bedtime. Pleasant upon approach. Denied SI/HI. Denied hallucinations. Patient complained of anxiety and requested medication. Had a snack and went to bed. Currently sleeping and safety maintained.

## 2019-11-03 NOTE — BHH Suicide Risk Assessment (Signed)
B[facility] INPATIENT:  Family/Significant Other Suicide Prevention Education  Suicide Prevention Education:  Education Completed; Haddon Lanzo, mother [phone] has been identified by the patient as the family member/significant other with whom the patient will be residing, and identified as the person(s) who will aid the patient in the event of a mental health crisis (suicidal ideations/suicide attempt).  With written consent from the patient, the family member/significant other has been provided the following suicide prevention education, prior to the and/or following the discharge of the patient.  The suicide prevention education provided includes the following:  Suicide risk factors  Suicide prevention and interventions  National Suicide Hotline telephone number  [facility] assessment telephone number  [facility] Emergency Assistance [place] and/or Residential Mobile Crisis Unit telephone number  Request made of family/significant other to:  Remove weapons (e.g., guns, rifles, knives), all items previously/currently identified as safety concern.    Remove drugs/medications (over-the-counter, prescriptions, illicit drugs), all items previously/currently identified as a safety concern.  The family member/significant other verbalizes understanding of the suicide prevention education information provided.  The family member/significant other agrees to remove the items of safety concern listed above. Ms. Scarpulla reports the pt has history of depression and says he came into the hospital due to overdose attempt on blood pressure medication. She states this was triggered by anxiety about a court date he had on 1/22. Ms. 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 She states the pt loss his job as a result of this allegation. She reports the weapons in the home have been removed and given to the pt's younger  brother. Ms. Bick states she plans to put the pt medication in a lock box and administer/monitor when the pt takes his medication.  Roshelle Traub T Abdallah Hern 11/03/2019, 11:54 AM

## 2019-11-03 NOTE — Progress Notes (Signed)
D: Major Depression  A: Patient stated slept good last night .Stated appetite  fair and energy level  Is normal. Stated concentration good . Stated on Depression scale  0 , hopeless 0 and anxiety 4  .( low 0-10 high) Denies suicidal  homicidal ideations  .  No auditory hallucinations  No pain concerns . Appropriate ADL'S. Interacting with peers and staff.   Encourage patient participation with unit programming . Instruction  Given on  Medication , verbalize understanding. Patient Youth worker voice of anxiety . Stated he is working on his  Breathing and coping skills   R: Voice no other concerns. Staff continue to monitor

## 2019-11-03 NOTE — Progress Notes (Signed)
Recreation Therapy Notes  INPATIENT RECREATION THERAPY ASSESSMENT  Patient Details Name: Glenn Rasmussen MRN: [id number] DOB: December 30, 1990 Today's Date: 11/03/2019       Information Obtained From: Patient  Able to Participate in Assessment/Interview: Yes  Patient Presentation: Responsive  Reason for Admission (Per Patient): Active Symptoms, Suicidal Ideation, Suicide Attempt  Patient Stressors:    Coping Skills:   Music, Talk  Leisure Interests (2+):  Petra Kuba - [place], Individual - Reading  Frequency of Recreation/Participation: Monthly  Awareness of Community Resources:     [employer]:     Current Use:    If no, Barriers?:    Expressed Interest in [place] of Residence:  Insurance underwriter  Patient Main Form of Transportation: Musician  Patient Strengths:  Surveyor, mining, trust worthy  Patient Identified Areas of Improvement:  My anxiety and talk more  Patient Goal for Hospitalization:  To work on my anxiety  Current SI (including self-harm):  No  Current HI:  No  Current AVH: No  Staff Intervention Plan: Group Attendance, Collaborate with Interdisciplinary Treatment Team  Consent to Intern Participation: N/A  Oneta Sigman 11/03/2019, 2:37 PM

## 2019-11-03 NOTE — Tx Team (Addendum)
Interdisciplinary Treatment and Diagnostic Plan Update  11/03/2019 Time of Session: 900am Glenn Rasmussen MRN: [id number]  Principal Diagnosis: Severe recurrent major depression without psychotic features [facility])  Secondary Diagnoses: Principal Problem:   Severe recurrent major depression without psychotic features [facility]) Active Problems:   Suicide attempt ([place])   Migraine headache   Current Medications:  Current Facility-Administered Medications  Medication Dose Route Frequency Provider Last Rate Last Admin  . acetaminophen (TYLENOL) tablet 650 mg  650 mg Oral Q6H PRN Clapacs, John T, MD      . alum & mag hydroxide-simeth (MAALOX/MYLANTA) 200-200-20 MG/5ML suspension 30 mL  30 mL Oral Q4H PRN Clapacs, John T, MD      . clonazePAM Bobbye Charleston) tablet 0.5 mg  0.5 mg Oral BID PRN Clapacs, Madie Reno, MD   0.5 mg at 11/03/19 0951  . magnesium hydroxide (MILK OF MAGNESIA) suspension 30 mL  30 mL Oral Daily PRN Clapacs, John T, MD      . pantoprazole (PROTONIX) EC tablet 40 mg  40 mg Oral Daily Clapacs, Madie Reno, MD   40 mg at 11/03/19 0803  . traZODone (DESYREL) tablet 200 mg  200 mg Oral QHS Clapacs, John T, MD   200 mg at 11/02/19 2157  . venlafaxine XR (EFFEXOR-XR) 24 hr capsule 150 mg  150 mg Oral Q breakfast Clapacs, Madie Reno, MD   150 mg at 11/03/19 0803   PTA Medications: Medications Prior to Admission  Medication Sig Dispense Refill Last Dose  . amLODipine (NORVASC) 10 MG tablet Take 1 tablet (10 mg total) by mouth daily.     Marland Kitchen aspirin 325 MG EC tablet Take 1 tablet (325 mg total) by mouth daily. 30 tablet 1   . ibuprofen (ADVIL) 600 MG tablet Take 1 tablet (600 mg total) by mouth every 6 (six) hours as needed for headache or moderate pain. 30 tablet 0   . labetalol (NORMODYNE) 100 MG tablet Take 1 tablet (100 mg total) by mouth 2 (two) times daily.     . Multiple Vitamin (MULTIVITAMIN WITH MINERALS) TABS tablet Take 1 tablet by mouth once a week.     . traZODone (DESYREL) 100 MG tablet Take  2 tablets (200 mg total) by mouth at bedtime. 60 tablet 1     Patient Stressors: Arts development officer issue Medication change or noncompliance  Patient Strengths: Ability for insight Communication skills Physical Health Work skills  Treatment Modalities: Medication Management, Group therapy, Case management,  1 to 1 session with clinician, Psychoeducation, Recreational therapy.   Physician Treatment Plan for Primary Diagnosis: Severe recurrent major depression without psychotic features ([place]) Long Term Goal(s): Improvement in symptoms so as ready for discharge Improvement in symptoms so as ready for discharge   Short Term Goals: Ability to verbalize feelings will improve Ability to disclose and discuss suicidal ideas Compliance with prescribed medications will improve  Medication Management: Evaluate patient's response, side effects, and tolerance of medication regimen.  Therapeutic Interventions: 1 to 1 sessions, Unit Group sessions and Medication administration.  Evaluation of Outcomes: Not Met  Physician Treatment Plan for Secondary Diagnosis: Principal Problem:   Severe recurrent major depression without psychotic features ([place]) Active Problems:   Suicide attempt ([place])   Migraine headache  Long Term Goal(s): Improvement in symptoms so as ready for discharge Improvement in symptoms so as ready for discharge   Short Term Goals: Ability to verbalize feelings will improve Ability to disclose and discuss suicidal ideas Compliance with prescribed medications will improve  Medication Management: Evaluate patient's response, side effects, and tolerance of medication regimen.  Therapeutic Interventions: 1 to 1 sessions, Unit Group sessions and Medication administration.  Evaluation of Outcomes: Not Met   RN Treatment Plan for Primary Diagnosis: Severe recurrent major depression without psychotic features ([place]) Long Term Goal(s): Knowledge of disease and  therapeutic regimen to maintain health will improve  Short Term Goals: Ability to demonstrate self-control, Ability to participate in decision making will improve, Ability to verbalize feelings will improve and Ability to identify and develop effective coping behaviors will improve  Medication Management: RN will administer medications as ordered by provider, will assess and evaluate patient's response and provide education to patient for prescribed medication. RN will report any adverse and/or side effects to prescribing provider.  Therapeutic Interventions: 1 on 1 counseling sessions, Psychoeducation, Medication administration, Evaluate responses to treatment, Monitor vital signs and CBGs as ordered, Perform/monitor CIWA, COWS, AIMS and Fall Risk screenings as ordered, Perform wound care treatments as ordered.  Evaluation of Outcomes: Not Met   LCSW Treatment Plan for Primary Diagnosis: Severe recurrent major depression without psychotic features ([place]) Long Term Goal(s): Safe transition to appropriate next level of care at discharge, Engage patient in therapeutic group addressing interpersonal concerns.  Short Term Goals: Engage patient in aftercare planning with referrals and resources, Increase social support, Increase emotional regulation and Increase skills for wellness and recovery  Therapeutic Interventions: Assess for all discharge needs, 1 to 1 time with Social worker, Explore available resources and support systems, Assess for adequacy in community support network, Educate family and significant other(s) on suicide prevention, Complete Psychosocial Assessment, Interpersonal group therapy.  Evaluation of Outcomes: Not Met   Progress in Treatment: Attending groups: No. Participating in groups: No. Taking medication as prescribed: Yes. Toleration medication: Yes. Family/Significant other contact made: No, will contact:  when consent given Patient understands diagnosis:  Yes. Discussing patient identified problems/goals with staff: Yes. Medical problems stabilized or resolved: Yes. Denies suicidal/homicidal ideation: Yes. Issues/concerns per patient self-inventory: No. Other: N/A  New problem(s) identified: No, Describe:  none  New Short Term/Long Term Goal(s): Detox, elimination of AVH/symptoms of psychosis, medication management for mood stabilization; elimination of SI thoughts; development of comprehensive mental wellness/sobriety plan.   Patient Goals:  "Work on my anxiety"  Discharge Plan or Barriers: SPE pamphlet, Mobile Crisis information, and AA/NA information provided to patient for additional community support and resources. CSW assessing for appropriate referrals.  Reason for Continuation of Hospitalization: Anxiety Medication stabilization  Estimated Length of Stay: 3-5 days  Recreational Therapy: Patient: N/A Patient Goal: Patient will engage in groups without prompting or encouragement from LRT x3 group sessions within 5 recreation therapy group sessions.  Attendees: Patient: Glenn Rasmussen 11/03/2019 10:09 AM  Physician: Dr Weber Cooks MD 11/03/2019 10:09 AM  Nursing:  11/03/2019 10:09 AM  RN Care Manager: 11/03/2019 10:09 AM  Social Worker: Minette Brine Moton LCSW 11/03/2019 10:09 AM  Recreational Therapist: Roanna Epley CTRS LRT 11/03/2019 10:09 AM  Other: Sanjuana Kava LCSW  11/03/2019 10:09 AM  Other: Assunta Curtis LCSW 11/03/2019 10:09 AM  Other: 11/03/2019 10:09 AM    Scribe for Treatment Team: Mariann Laster Moton, LCSW 11/03/2019 10:09 AM

## 2019-11-03 NOTE — Progress Notes (Signed)
[facility] MD Progress Note  11/03/2019 2:11 PM Glenn Rasmussen  MRN:  [id number]   Subjective: Follow-up for this 29 year old male diagnosed with severe recurrent major depressive disorder without psychotic features.  Patient reports today that he is feeling much better.  He states that he slept extremely well last night after taking the trazodone.  He denies having any medication side effects today.  He states that his appetite is improved and he has been eating more now that he is able to choose some of the foods that he is eating.  He denies having any suicidal or homicidal ideations and denies any hallucinations.  Patient reports that he has been on medications in the past and they do significantly help him.  He reports that his mother lives with him in his apartment and she has been very supportive with this most recent episode of depression.  He reports that he was happy to hear that [facility] can provide him services without having any insurance or income.  Patient is already stating that he is interested in house and he could possibly be discharged in another 1 to 2 days is okay with him.  Principal Problem: Severe recurrent major depression without psychotic features ([place]) Diagnosis: Principal Problem:   Severe recurrent major depression without psychotic features  [facility]) Active Problems:   Suicide attempt [facility])   Migraine headache  Total Time spent with patient: 20 minutes  Past Psychiatric History: Patient has had a prior admission in June of this year also for a serious suicide attempt.  He responded to antidepressant medication and has a history of doing well on antidepressant specifically on Effexor which had been used in the past.  No history of mania no history of substance abuse  Past Medical History:  Past Medical History:  Diagnosis Date  . Anxiety   . Hypertension   . IBS (irritable bowel syndrome)   . Migraines     Past Surgical History:  Procedure Laterality Date  . APPENDECTOMY     . CHOLECYSTECTOMY    . DIALYSIS/PERMA CATHETER INSERTION N/A 03/26/2019   Procedure: DIALYSIS/PERMA CATHETER INSERTION;  Surgeon: Algernon Huxley, MD;  Location: [place] [facility];  Service: Cardiovascular;  Laterality: N/A;  . DIALYSIS/PERMA CATHETER REMOVAL N/A 04/03/2019   Procedure: DIALYSIS/PERMA CATHETER REMOVAL;  Surgeon: Algernon Huxley, MD;  Location: [place] [facility];  Service: Cardiovascular;  Laterality: N/A;  . TONSILLECTOMY     Family History:  Family History  Problem Relation Age of Onset  . Lung cancer Mother   . Lung cancer Father    Family Psychiatric  History: None reported Social History:  Social History   Substance and Sexual Activity  Alcohol Use No     Social History   Substance and Sexual Activity  Drug Use No    Social History   Socioeconomic History  . Marital status: Single    Spouse name: Not on file  . Number of children: Not on file  . Years of education: Not on file  . Highest education level: Not on file  Occupational History  . Not on file  Tobacco Use  . Smoking status: Never Smoker  . Smokeless tobacco: Never Used  Substance and Sexual Activity  . Alcohol use: No  . Drug use: No  . Sexual activity: Not on file  Other Topics Concern  . Not on file  Social History Narrative  . Not on file   Social Determinants of Health   Financial Resource Strain:   .  Difficulty of Paying Living Expenses: Not on file  Food Insecurity:   . Worried About Charity fundraiser in the Last Year: Not on file  . Ran Out of Food in the Last Year: Not on file  Transportation Needs:   . Lack of Transportation (Medical): Not on file  . Lack of Transportation (Non-Medical): Not on file  Physical Activity:   . Days of Exercise per Week: Not on file  . Minutes of Exercise per Session: Not on file  Stress:   . Feeling of Stress : Not on file  Social Connections:   . Frequency of Communication with Friends and Family: Not on file  . Frequency of Social  Gatherings with Friends and Family: Not on file  . Attends Religious Services: Not on file  . Active Member of Clubs or Organizations: Not on file  . Attends Archivist Meetings: Not on file  . Marital Status: Not on file   Additional Social History:                         Sleep: Good  Appetite:  Good  Current Medications: Current Facility-Administered Medications  Medication Dose Route Frequency Provider Last Rate Last Admin  . acetaminophen (TYLENOL) tablet 650 mg  650 mg Oral Q6H PRN Clapacs, John T, MD      . alum & mag hydroxide-simeth (MAALOX/MYLANTA) 200-200-20 MG/5ML suspension 30 mL  30 mL Oral Q4H PRN Clapacs, John T, MD      . clonazePAM Bobbye [place]) tablet 0.5 mg  0.5 mg Oral BID PRN Clapacs, Madie Reno, MD   0.5 mg at 11/03/19 0951  . magnesium hydroxide (MILK OF MAGNESIA) suspension 30 mL  30 mL Oral Daily PRN Clapacs, John T, MD      . pantoprazole (PROTONIX) EC tablet 40 mg  40 mg Oral Daily Clapacs, Madie Reno, MD   40 mg at 11/03/19 0803  . traZODone (DESYREL) tablet 200 mg  200 mg Oral QHS Clapacs, John T, MD   200 mg at 11/02/19 2157  . venlafaxine XR (EFFEXOR-XR) 24 hr capsule 150 mg  150 mg Oral Q breakfast Clapacs, Madie Reno, MD   150 mg at 11/03/19 [id number]    Lab Results: No results found for this or any previous visit (from the past 48 hour(s)).  Blood Alcohol level:  Lab Results  Component Value Date   ETH <10 10/30/2019   ETH <10 [id number]    Metabolic Disorder Labs: Lab Results  Component Value Date   HGBA1C 5.5 03/13/2019   MPG 111 03/13/2019   No results found for: PROLACTIN Lab Results  Component Value Date   CHOL 154 03/13/2019   TRIG 263 (H) 03/23/2019   HDL 32 (L) 03/13/2019   CHOLHDL 4.8 03/13/2019   VLDL 52 (H) 03/13/2019   LDLCALC 70 03/13/2019    Physical Findings: AIMS:  , ,  ,  ,    CIWA:    COWS:     Musculoskeletal: Strength & Muscle Tone: within normal limits Gait & Station: normal Patient leans:  N/A  Psychiatric Specialty Exam: Physical Exam  Nursing note and vitals reviewed. Constitutional: He is oriented to person, place, and time. He appears well-developed and well-nourished.  Cardiovascular: Normal rate.  Respiratory: Effort normal.  Musculoskeletal:        General: Normal range of motion.  Neurological: He is alert and oriented to person, place, and time.  Skin: Skin is warm.  Review of Systems  Constitutional: Negative.   HENT: Negative.   Eyes: Negative.   Respiratory: Negative.   Cardiovascular: Negative.   Gastrointestinal: Negative.   Genitourinary: Negative.   Musculoskeletal: Negative.   Skin: Negative.   Neurological: Negative.   Psychiatric/Behavioral: Negative.     Blood pressure 140/81, pulse 88, temperature 98.5 F (36.9 C), temperature source Oral, resp. rate 18, height 5\' 7"  (1.702 m), weight 96.2 kg, SpO2 (!) 80 %.Body mass index is 33.2 kg/m.  General Appearance: Casual  Eye Contact:  Good  Speech:  Clear and Coherent and Normal Rate  Volume:  Normal  Mood:  Euthymic  Affect:  Congruent  Thought Process:  Coherent and Descriptions of Associations: Intact  Orientation:  Full (Time, Place, and Person)  Thought Content:  WDL  Suicidal Thoughts:  No  Homicidal Thoughts:  No  Memory:  Immediate;   Fair Recent;   Fair Remote;   Fair  Judgement:  Fair  Insight:  Fair  Psychomotor Activity:  Normal  Concentration:  Concentration: Good  Recall:  [place] of Knowledge:  Fair  Language:  Good  Akathisia:  No  Handed:  Right  AIMS (if indicated):     Assets:  Communication Skills Desire for Improvement Housing Social Support Transportation  ADL's:  Intact  Cognition:  WNL  Sleep:  Number of Hours: 7   Assessment: Patient presents in the day room and has been interacting with peers and staff appropriately.  Patient is pleasant, calm, and cooperative.  Patient has congruent affect and is euthymic today.  Patient has been attending  groups throughout the day.  Patient has been compliant with medications and treatment throughout his stay.  Feel that with patient having significant improvements potential discharge plan should be for approximately Thursday.  Treatment Plan Summary: Daily contact with patient to assess and evaluate symptoms and progress in treatment and Medication management Continue Klonopin 0.5 mg p.o. twice daily as needed for anxiety Continue Protonix 40 mg p.o. daily for GERD Continue trazodone 200 mg p.o. nightly for insomnia and mood stability Continue Effexor XR 150 mg p.o. daily for depression Continue every 15 minute safety checks Encourage continued group therapy participation  Lewis Shock, FNP 11/03/2019, 2:11 PM

## 2019-11-03 NOTE — [facility] Suicide Risk Assessment (Signed)
[place] INPATIENT:  Family/Significant Other Suicide Prevention Education  Suicide Prevention Education:  Contact Attempts: Daiyon Luft, mother 66 [phone] has been identified by the patient as the family member/significant other with whom the patient will be residing, and identified as the person(s) who will aid the patient in the event of a mental health crisis.  With written consent from the patient, two attempts were made to provide suicide prevention education, prior to and/or following the patient's discharge.  We were unsuccessful in providing suicide prevention education.  A suicide education pamphlet was given to the patient to share with family/significant other.  Date and time of first attempt: CSW left confidential vm on 11/03/19 at 1022am, awaiting response. Date and time of second attempt: 2nd attempt needed  Lyrick Worland T Roi Jafari 11/03/2019, 10:23 AM

## 2019-11-03 NOTE — Progress Notes (Signed)
Lorance went to bed on time and had no difficulty falling asleep. Patient slept throughout the night and was up in the morning for vital signs. Remains pleasant and cooperative and motivated for treatment. Support and encouragements provided. Safety monitored as recommended.

## 2019-11-03 NOTE — [facility] Counselor (Signed)
Adult Comprehensive Assessment  Patient ID: Glenn Rasmussen, male   DOB: 1991/03/07, 29 y.o.   MRN: [id number]  Information Source:   Information source: Patient   Current Stressors:  Patient states their primary concerns and needs for treatment are:: Anxiety. Pt states a recent overdose attempt on blood pressure medication that was triggered by an upcoming court case. Pt reports one of his church members accused him of stealing money from them. Patient states their goals for this hospitalization and ongoing recovery are:: "Work on my anxiety." Educational / Learning stressors: BA in emergency medicine Employment / Job issues: Unemployed since July 2020 Family Relationships: Pt reports mother is his main Training and development officer / Lack of resources (include bankruptcy): No income Housing / Lack of housing: Has his own home Substance abuse: Pt denies Bereavement / Loss: Both of pts grandparents who raised him are deceased   Living/Environment/Situation:  Living Arrangements: Alone Living conditions (as described by patient or guardian): "great" Who else lives in the home?: Mother How long has patient lived in current situation?: 3 years   Family History:  Marital status: Single Does patient have children?: No   Childhood History:  By whom was/is the patient raised?: Grandparents Additional childhood history information: Pt was raised by his maternal grandparents Description of patient's relationship with caregiver when they were a child: "strict but good" Patient's description of current relationship with people who raised him/her: grandparents are deceased Does patient have siblings?: Yes Number of Siblings: 1 Description of patient's current relationship with siblings: Pt has a younger brother "pretty good relationship" Did patient suffer any verbal/emotional/physical/sexual abuse as a child?: No Did patient suffer from severe childhood neglect?: No Has patient ever been sexually  abused/assaulted/raped as an adolescent or adult?: No Was the patient ever a victim of a crime or a disaster?: No Witnessed domestic violence?: No Has patient been effected by domestic violence as an adult?: No   Education:  Highest grade of school patient has completed: BA in emergency medicine Currently a student?: No Learning disability?: No   Employment/Work Situation:   Employment situation: Unemployed Patient's job has been impacted by current illness: No What is the longest time patient has a held a job?: 8-9 years 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 Did You Receive Any Psychiatric Treatment/Services While in the [employer]?: No Are There Guns or Other Weapons in [place]?: No Are These Psychologist, educational?: Pt denies Development worker, international aid Resources:   Financial resources: No income Does patient have a Programmer, applications or guardian?: No   Alcohol/Substance Abuse:   What has been your use of drugs/alcohol within the last 12 months?: Pt denies Alcohol/Substance Abuse Treatment Hx: Denies past history Has alcohol/substance abuse ever caused legal problems?: No   Social Support System:   Pensions consultant Support System: Fair Dietitian Support System: Mother Type of faith/religion: Darrick Meigs How does patient's faith help to cope with current illness?: "I haven't been able to"   Leisure/Recreation:   Leisure and Hobbies: "fishing, hunting, reading"   Strengths/Needs:   What is the patient's perception of their strengths?: "getting to know others" Patient states they can use these personal strengths during their treatment to contribute to their recovery: "get to know others, relate to them" Patient states these barriers may affect/interfere with their treatment: pt denies Patient states these barriers may affect their return to the community: pt denies Other important information patient would like considered in planning for their treatment:  N/A  Discharge Plan:   Currently receiving community mental health services: No Patient states concerns and preferences for aftercare planning are: Pt is agreeable to [facility] referral Patient states they will know when they are safe and ready for discharge when: "Knowing I'm more comfortable and calm" Does patient have access to transportation?: Yes Does patient have financial barriers related to discharge medications?: Yes, no insurance Will patient be returning to same living situation after discharge?: Yes    Summary/Recommendations:   Summary and Recommendations (to be completed by the evaluator): Pt is a 29 yr old male who reports coming to the ED due to overdose attempt on blood pressure medication, that he reports was triggered by anxiety of an upcoming court date. Pt reports being unemployed, has stable housing and denies any trauma or abuse. Pt is agreeable to following up at [facility] at discharge. Recommendations for pt include: crisis stabilization, therapeutic milieu, encourage group attendance and participation, medication management for mood stabilization, and development for comprehensive mental wellness plan. CSW assessing for appropriate referrals.  Minda Faas Lynelle Smoke. 11/03/2019

## 2019-11-03 NOTE — Plan of Care (Signed)
Aware of information received from staff regarding  education . Emotional and mental status improved . No anger outburst  able to control behavior . Voice of no safety concerns . Working on coping  anxiety and Banker.  Attending unit programing .  Problem: Self-Concept: Goal: Will verbalize positive feelings about self Outcome: Progressing Goal: Level of anxiety will decrease Outcome: Progressing   Problem: Health Behavior/Discharge Planning: Goal: Ability to make decisions will improve Outcome: Progressing Goal: Compliance with therapeutic regimen will improve Outcome: Progressing   Problem: Coping: Goal: Coping ability will improve Outcome: Progressing Goal: Will verbalize feelings Outcome: Progressing   Problem: Education: Goal: Utilization of techniques to improve thought processes will improve Outcome: Progressing   Problem: Safety: Goal: Periods of time without injury will increase Outcome: Progressing   Problem: Coping: Goal: Ability to verbalize frustrations and anger appropriately will improve Outcome: Progressing Goal: Ability to demonstrate self-control will improve Outcome: Progressing   Problem: Education: Goal: Knowledge of [facility] Education information/materials will improve Outcome: Progressing Goal: Emotional status will improve Outcome: Progressing Goal: Mental status will improve Outcome: Progressing Goal: Verbalization of understanding the information provided will improve Outcome: Progressing

## 2019-11-03 NOTE — Plan of Care (Signed)
D- Patient alert and oriented. Patient presented in a pleasant mood on assessment stating that he has a good day, "I'm ok right now", and had no complaints to voice to this Probation officer. Patient denies SI, HI, AVH, and pain at this time. Patient also denies depression/anxiety, however, he stated that he had some anxiety earlier, but "it got better".  A- Scheduled medications administered to patient, per MD orders. Support and encouragement provided.  Routine safety checks conducted every 15 minutes.  Patient informed to notify staff with problems or concerns.  R- No adverse drug reactions noted. Patient contracts for safety at this time. Patient compliant with medications and treatment plan. Patient receptive, calm, and cooperative. Patient interacts well with others on the unit.  Patient remains safe at this time.  Problem: Education: Goal: Knowledge of [facility] Education information/materials will improve Outcome: Progressing Goal: Emotional status will improve Outcome: Progressing Goal: Mental status will improve Outcome: Progressing Goal: Verbalization of understanding the information provided will improve Outcome: Progressing   Problem: Coping: Goal: Ability to verbalize frustrations and anger appropriately will improve Outcome: Progressing Goal: Ability to demonstrate self-control will improve Outcome: Progressing   Problem: Safety: Goal: Periods of time without injury will increase Outcome: Progressing   Problem: Education: Goal: Utilization of techniques to improve thought processes will improve Outcome: Progressing   Problem: Coping: Goal: Coping ability will improve Outcome: Progressing Goal: Will verbalize feelings Outcome: Progressing   Problem: Health Behavior/Discharge Planning: Goal: Ability to make decisions will improve Outcome: Progressing Goal: Compliance with therapeutic regimen will improve Outcome: Progressing   Problem: Self-Concept: Goal: Will  verbalize positive feelings about self Outcome: Progressing Goal: Level of anxiety will decrease Outcome: Progressing

## 2019-11-03 NOTE — Progress Notes (Signed)
Recreation Therapy Notes   Date: 11/03/2019  Time: 9:30 am  Location: Craft room  Behavioral response: Appropriate  Intervention Topic: Coping Skills   Discussion/Intervention:  Group content on today was focused on coping skills. The group defined what coping skills are and when they can be used. Individuals described how they normally cope with thing and the coping skills they normally use. Patients expressed why it is important to cope with things and how not coping with things can affect you. The group participated in the intervention "My coping box" and made coping boxes while adding coping skills they could use in the future to the box. Clinical Observations/Feedback:  Patient came to group late and was focused on what peers and staff had to say about coping skills. Individual was social with peers and staff while participating in the intervention during group.  Glenn Rasmussen LRT/CTRS      Glenn Rasmussen 11/03/2019 11:27 AM

## 2019-11-04 DIAGNOSIS — F332 Major depressive disorder, recurrent severe without psychotic features: Secondary | ICD-10-CM

## 2019-11-04 LAB — CULTURE, BLOOD (ROUTINE X 2)
Culture: NO GROWTH
Culture: NO GROWTH
Special Requests: ADEQUATE
Special Requests: ADEQUATE

## 2019-11-04 MED ORDER — TRAZODONE HCL 100 MG PO TABS: 200.0000 mg | ORAL_TABLET | Freq: Every day | ORAL | 0 refills | Status: DC

## 2019-11-04 MED ORDER — VENLAFAXINE HCL ER 150 MG PO CP24: 150.0000 mg | ORAL_CAPSULE | Freq: Every day | ORAL | 0 refills | Status: DC

## 2019-11-04 MED ORDER — PANTOPRAZOLE SODIUM 40 MG PO TBEC: 40.0000 mg | DELAYED_RELEASE_TABLET | Freq: Every day | ORAL | 0 refills | Status: DC

## 2019-11-04 NOTE — Plan of Care (Signed)
Aware of information received from staff regarding  education . Emotional and mental status improved . No anger outburst  able to control behavior . Voice of no safety concerns . Working on coping  anxiety and Banker.  Attending unit programing .   Problem: Education: Goal: Knowledge of [facility] Education information/materials will improve Outcome: Progressing Goal: Emotional status will improve Outcome: Progressing Goal: Mental status will improve Outcome: Progressing Goal: Verbalization of understanding the information provided will improve Outcome: Progressing   Problem: Coping: Goal: Ability to verbalize frustrations and anger appropriately will improve Outcome: Progressing Goal: Ability to demonstrate self-control will improve Outcome: Progressing   Problem: Safety: Goal: Periods of time without injury will increase Outcome: Progressing   Problem: Coping: Goal: Coping ability will improve Outcome: Progressing Goal: Will verbalize feelings Outcome: Progressing   Problem: Health Behavior/Discharge Planning: Goal: Ability to make decisions will improve Outcome: Progressing Goal: Compliance with therapeutic regimen will improve Outcome: Progressing   Problem: Self-Concept: Goal: Will verbalize positive feelings about self Outcome: Progressing Goal: Level of anxiety will decrease Outcome: Progressing

## 2019-11-04 NOTE — Progress Notes (Signed)
Recreation Therapy Notes   Date: 11/04/2019  Time: 9:30 am  Location: Craft room  Behavioral response: Appropriate  Intervention Topic: Decision Making   Discussion/Intervention:  Group content today was focused on Decision making. The group defined decision making and some positive ways they make decisions for themselves. Individuals expressed reasons why they neglected any decision making in the past. Patients described ways to improve decision making skills in the future. The group explained what could happen if they did not do any decision making at all. Participants express how bad decision has affected them and others around them. Individual explained the importance of decision making. The group participated in the intervention "Making decisions" where they had a chance to discover some of their weaknesses and strengths in decision making. Patient came up with a new decision-making skill to improve themselves in the future.  Clinical Observations/Feedback:  Patient came to group and defined decision making as picking the solution. He stated that he makes decisions based off what feels right. Participant identified outside influences and not trusting others as reasons he does not always make the best decisions. Individual was social with peers and staff while participating in the intervention during group.  Rokhaya Quinn LRT/CTRS         Atreyu Mak 11/04/2019 11:12 AM

## 2019-11-04 NOTE — Progress Notes (Signed)
D:Major Depression   A: Patient stated slept good last night .Stated appetite is good and energy level  Is normal. Stated concentration is good . Stated on Depression scale 10, hopeless 10 and anxiety 10 .( low 0-10 high) Denies suicidal  homicidal ideations  .  No auditory hallucinations  No pain concerns . Appropriate ADL'S. Interacting with peers and staff.  Aware of information received from staff regarding  education . Emotional and mental status improved . No anger outburst  able to control behavior . Voice of no safety concerns . Working on coping  anxiety and Banker.  Attending unit programing . Encourage patient participation with unit programming . Instruction  Given on  Medication , verbalize understanding.   R: Voice no other concerns. Staff continue to monitor

## 2019-11-04 NOTE — Progress Notes (Signed)
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  Patient states today that he is feeling better than he did yesterday.  He states states that he continually has some improvement.  He denies having any suicidal or homicidal ideations and denies any hallucinations.  Patient reports that he is continued sleeping well and that his appetite has been very good.  Patient reports that he has had a very good stay at the hospital.  He states that he feels the medications are significantly helping him with his energy, mood, and sleep.  Patient reports that he feels that he is nearing his time for discharge.  He continues to report that he is going to stay with his mother and she will be the one to pick him up from the hospital when he discharges.  He also reports that he is looking forward to working with [facility] for his mental health treatment as well as being told that they have a program to assist him with getting employment.  Principal Problem: Severe recurrent major depression without psychotic features ([place]) Diagnosis: Principal Problem:   Severe recurrent major depression without psychotic features [facility]) Active Problems:   Suicide attempt [facility])   Migraine headache  Total Time spent with patient: 20 minutes  Past Psychiatric History: Patient has had a prior admission in June of this year also for a serious suicide attempt.  He responded to antidepressant medication and has a history of doing well on antidepressant specifically on Effexor which had been used in the past.  No history of mania no history of substance abuse  Past Medical History:  Past Medical History:  Diagnosis Date  . Anxiety   . Hypertension   . IBS (irritable bowel syndrome)   . Migraines     Past Surgical History:  Procedure Laterality Date  . APPENDECTOMY    .  CHOLECYSTECTOMY    . DIALYSIS/PERMA CATHETER INSERTION N/A 03/26/2019   Procedure: DIALYSIS/PERMA CATHETER INSERTION;  Surgeon: Algernon Huxley, MD;  Location: [facility] [facility];  Service: Cardiovascular;  Laterality: N/A;  . DIALYSIS/PERMA CATHETER REMOVAL N/A 04/03/2019   Procedure: DIALYSIS/PERMA CATHETER REMOVAL;  Surgeon: Algernon Huxley, MD;  Location: [place] [facility];  Service: Cardiovascular;  Laterality: N/A;  . TONSILLECTOMY     Family History:  Family History  Problem Relation Age of Onset  . Lung cancer Mother   . Lung cancer Father    Family Psychiatric  History: None reported Social History:  Social History   Substance and Sexual Activity  Alcohol Use No     Social History   Substance and Sexual Activity  Drug Use No    Social History   Socioeconomic History  . Marital status: Single    Spouse name: Not on file  . Number of children: Not on file  . Years of education: Not on file  . Highest education level: Not on file  Occupational History  . Not on file  Tobacco Use  . Smoking status: Never Smoker  . Smokeless tobacco: Never Used  Substance and Sexual Activity  . Alcohol use: No  . Drug use: No  . Sexual activity: Not on file  Other Topics Concern  . Not on file  Social History Narrative  . Not on file   Social Determinants of Health   Financial Resource Strain:   .  Difficulty of Paying Living Expenses: Not on file  Food Insecurity:   . Worried About Charity fundraiser in the Last Year: Not on file  . Ran Out of Food in the Last Year: Not on file  Transportation Needs:   . Lack of Transportation (Medical): Not on file  . Lack of Transportation (Non-Medical): Not on file  Physical Activity:   . Days of Exercise per Week: Not on file  . Minutes of Exercise per Session: Not on file  Stress:   . Feeling of Stress : Not on file  Social Connections:   . Frequency of Communication with Friends and Family: Not on file  . Frequency of Social  Gatherings with Friends and Family: Not on file  . Attends Religious Services: Not on file  . Active Member of Clubs or Organizations: Not on file  . Attends Archivist Meetings: Not on file  . Marital Status: Not on file   Additional Social History:                         Sleep: Good  Appetite:  Good  Current Medications: Current Facility-Administered Medications  Medication Dose Route Frequency Provider Last Rate Last Admin  . acetaminophen (TYLENOL) tablet 650 mg  650 mg Oral Q6H PRN Clapacs, John T, MD      . alum & mag hydroxide-simeth (MAALOX/MYLANTA) 200-200-20 MG/5ML suspension 30 mL  30 mL Oral Q4H PRN Clapacs, John T, MD      . clonazePAM Bobbye [place]) tablet 0.5 mg  0.5 mg Oral BID PRN Clapacs, Madie Reno, MD   0.5 mg at 11/03/19 2159  . magnesium hydroxide (MILK OF MAGNESIA) suspension 30 mL  30 mL Oral Daily PRN Clapacs, John T, MD      . pantoprazole (PROTONIX) EC tablet 40 mg  40 mg Oral Daily Clapacs, Madie Reno, MD   40 mg at 11/04/19 0752  . traZODone (DESYREL) tablet 200 mg  200 mg Oral QHS Clapacs, John T, MD   200 mg at 11/03/19 2117  . venlafaxine XR (EFFEXOR-XR) 24 hr capsule 150 mg  150 mg Oral Q breakfast Clapacs, Madie Reno, MD   150 mg at 11/04/19 [id number]    Lab Results: No results found for this or any previous visit (from the past 48 hour(s)).  Blood Alcohol level:  Lab Results  Component Value Date   ETH <10 10/30/2019   ETH <10 [id number]    Metabolic Disorder Labs: Lab Results  Component Value Date   HGBA1C 5.5 03/13/2019   MPG 111 03/13/2019   No results found for: PROLACTIN Lab Results  Component Value Date   CHOL 154 03/13/2019   TRIG 263 (H) 03/23/2019   HDL 32 (L) 03/13/2019   CHOLHDL 4.8 03/13/2019   VLDL 52 (H) 03/13/2019   LDLCALC 70 03/13/2019    Physical Findings: AIMS:  , ,  ,  ,    CIWA:    COWS:     Musculoskeletal: Strength & Muscle Tone: within normal limits Gait & Station: normal Patient leans:  N/A  Psychiatric Specialty Exam: Physical Exam  Nursing note and vitals reviewed. Constitutional: He is oriented to person, place, and time. He appears well-developed and well-nourished.  Cardiovascular: Normal rate.  Respiratory: Effort normal.  Musculoskeletal:        General: Normal range of motion.  Neurological: He is alert and oriented to person, place, and time.  Skin: Skin is warm.  Review of Systems  Constitutional: Negative.   HENT: Negative.   Eyes: Negative.   Respiratory: Negative.   Cardiovascular: Negative.   Gastrointestinal: Negative.   Genitourinary: Negative.   Musculoskeletal: Negative.   Skin: Negative.   Neurological: Negative.   Psychiatric/Behavioral: Negative.     Blood pressure (!) 154/105, pulse 88, temperature 98.1 F (36.7 C), temperature source Oral, resp. rate 18, height 5\' 7"  (1.702 m), weight 96.2 kg, SpO2 97 %.Body mass index is 33.2 kg/m.  General Appearance: Casual  Eye Contact:  Good  Speech:  Clear and Coherent and Normal Rate  Volume:  Normal  Mood:  Euthymic  Affect:  Congruent  Thought Process:  Coherent and Descriptions of Associations: Intact  Orientation:  Full (Time, Place, and Person)  Thought Content:  WDL  Suicidal Thoughts:  No  Homicidal Thoughts:  No  Memory:  Immediate;   Fair Recent;   Fair Remote;   Fair  Judgement:  Fair  Insight:  Fair  Psychomotor Activity:  Normal  Concentration:  Concentration: Good  Recall:  [place] of Knowledge:  Fair  Language:  Good  Akathisia:  No  Handed:  Right  AIMS (if indicated):     Assets:  Communication Skills Desire for Improvement Housing Social Support Transportation  ADL's:  Intact  Cognition:  WNL  Sleep:  Number of Hours: 7.75   Assessment: Patient again today presents in the day room interacting with peers and staff appropriately.  Patient is very pleasant and euthymic during the evaluation.  Patient reports that he continues to feel a little bit better  than he did yesterday.  He has continued to deny having any suicidal or homicidal ideations and denies any hallucinations.  Patient is to follow-up with [facility].  Patient will be supplied with 7-day samples of his medications upon discharge.  Plan is for discharge of this patient tomorrow.  Patient has continued to be compliant with treatment as well as with medications.  Treatment Plan Summary: Daily contact with patient to assess and evaluate symptoms and progress in treatment and Medication management Continue Klonopin 0.5 mg p.o. twice daily as needed for anxiety Continue Protonix 40 mg p.o. daily for GERD Continue trazodone 200 mg p.o. nightly for insomnia and mood stability Continue Effexor XR 150 mg p.o. daily for depression Continue every 15 minute safety checks Encourage continued group therapy participation  Lewis Shock, FNP 11/04/2019, 2:20 PM

## 2019-11-04 NOTE — BHH Group Notes (Signed)
LCSW Group Therapy Note  11/04/2019 11:31 AM  Type of Therapy/Topic:  Group Therapy:  Emotion Regulation  Participation Level:  Did Not Attend   Description of Group:   The purpose of this group is to assist patients in learning to regulate negative emotions and experience positive emotions. Patients will be guided to discuss ways in which they have been vulnerable to their negative emotions. These vulnerabilities will be juxtaposed with experiences of positive emotions or situations, and patients will be challenged to use positive emotions to combat negative ones. Special emphasis will be placed on coping with negative emotions in conflict situations, and patients will process healthy conflict resolution skills.  Therapeutic Goals: 1. Patient will identify two positive emotions or experiences to reflect on in order to balance out negative emotions 2. Patient will label two or more emotions that they find the most difficult to experience 3. Patient will demonstrate positive conflict resolution skills through discussion and/or role plays  Summary of Patient Progress: x   Therapeutic Modalities:   Cognitive Behavioral Therapy Feelings Identification Dialectical Behavioral Therapy   Evalina Field, MSW, LCSW Clinical Social Work 11/04/2019 11:31 AM

## 2019-11-05 MED ORDER — PANTOPRAZOLE SODIUM 40 MG PO TBEC: 40.0000 mg | DELAYED_RELEASE_TABLET | Freq: Every day | ORAL | 1 refills | Status: AC

## 2019-11-05 MED ORDER — LABETALOL HCL 100 MG PO TABS: 100.0000 mg | ORAL_TABLET | Freq: Two times a day (BID) | ORAL | 1 refills | Status: AC

## 2019-11-05 MED ORDER — CLONAZEPAM 0.5 MG PO TABS: 0.5000 mg | ORAL_TABLET | Freq: Two times a day (BID) | ORAL | 0 refills | Status: AC | PRN

## 2019-11-05 MED ORDER — VENLAFAXINE HCL ER 150 MG PO CP24: 150.0000 mg | ORAL_CAPSULE | Freq: Every day | ORAL | 1 refills | Status: AC

## 2019-11-05 MED ORDER — AMLODIPINE BESYLATE 10 MG PO TABS: 10.0000 mg | ORAL_TABLET | Freq: Every day | ORAL | 1 refills | Status: AC

## 2019-11-05 MED ORDER — TRAZODONE HCL 100 MG PO TABS: 200.0000 mg | ORAL_TABLET | Freq: Every day | ORAL | 1 refills | Status: AC

## 2019-11-05 NOTE — Progress Notes (Signed)
Patient ID: Glenn Rasmussen, male   DOB: 1990-10-29, 29 y.o.   MRN: [id number]  Discharge Note:  Patient denies SI/HI/AVH at this time. Discharge instructions, AVS, prescriptions, and transition record gone over with patient. Patient agrees to comply with medication management, follow-up visit, and outpatient therapy. Patient belongings returned to patient. Patient questions and concerns addressed and answered. Patient ambulatory off unit. Patient discharged to home with his Mom.

## 2019-11-05 NOTE — Progress Notes (Signed)
D- Patient alert and oriented. Patient presents in a pleasant mood on assessment stating that he slept "pretty good" last night and had no complaints to voice to this Probation officer. Patient denies SI, HI, AVH, and pain at this time. Patient also denies any signs/symptoms of depression/anxiety, stating "surprisingly, no". Patient's goal for today is "heading home, anxiety coping", in which he will "work with staff", in order to accomplish his goal.  A- Scheduled medications administered to patient, per MD orders. Support and encouragement provided.  Routine safety checks conducted every 15 minutes.  Patient informed to notify staff with problems or concerns.  R- No adverse drug reactions noted. Patient contracts for safety at this time. Patient compliant with medications and treatment plan. Patient receptive, calm, and cooperative. Patient interacts well with others on the unit.  Patient remains safe at this time.

## 2019-11-05 NOTE — Progress Notes (Addendum)
  [facility] Adult Case Management Discharge Plan :  Will you be returning to the same living situation after discharge:  Yes,  home At discharge, do you have transportation home?: Yes,  pts mother will pick him up Do you have the ability to pay for your medications: Yes,  mental health  Release of information consent forms completed and in the chart;    Patient to Follow up at: Follow-up Information    [facility] Follow up.   Why: You are scheduled to meet with Sherrian Divers via zoom on Tuesday, February 2nd at [facility]. Thank you. Contact information: [place] [address] [phone]           Next level of care provider has access to [place] and Suicide Prevention discussed: Yes,  SPE completed with pts mother  Have you used any form of tobacco in the last 30 days? (Cigarettes, Smokeless Tobacco, Cigars, and/or Pipes): No  Has patient been referred to the Quitline?: Patient refused referral  Patient has been referred for addiction treatment: [place], LCSW 11/05/2019, 12:56 PM

## 2019-11-05 NOTE — BHH Group Notes (Signed)
Balance In Life 11/05/2019 1PM  Type of Therapy/Topic:  Group Therapy:  Balance in Life  Participation Level:  Active  Description of Group:   This group will address the concept of balance and how it feels and looks when one is unbalanced. Patients will be encouraged to process areas in their lives that are out of balance and identify reasons for remaining unbalanced. Facilitators will guide patients in utilizing problem-solving interventions to address and correct the stressor making their life unbalanced. Understanding and applying boundaries will be explored and addressed for obtaining and maintaining a balanced life. Patients will be encouraged to explore ways to assertively make their unbalanced needs known to significant others in their lives, using other group members and facilitator for support and feedback.  Therapeutic Goals: 1. Patient will identify two or more emotions or situations they have that consume much of in their lives. 2. Patient will identify signs/triggers that life has become out of balance:  3. Patient will identify two ways to set boundaries in order to achieve balance in their lives:  4. Patient will demonstrate ability to communicate their needs through discussion and/or role plays  Summary of Patient Progress: Actively and appropriately engaged in the group. Patient was able to provide support and validation to other group members.Patient practiced active listening when interacting with the facilitator and other group members. goals.     Therapeutic Modalities:   Cognitive Behavioral Therapy Solution-Focused Therapy Assertiveness Training  Tyjuan Demetro Lynelle Smoke, LCSW

## 2019-11-05 NOTE — BHH Suicide Risk Assessment (Signed)
[facility] Discharge Suicide Risk Assessment   Principal Problem: Severe recurrent major depression without psychotic features [facility]) Discharge Diagnoses: Principal Problem:   Severe recurrent major depression without psychotic features ([place]) Active Problems:   Suicide attempt ([place])   Migraine headache   Total Time spent with patient: 30 minutes  Musculoskeletal: Strength & Muscle Tone: within normal limits Gait & Station: normal Patient leans: N/A  Psychiatric Specialty Exam: Review of Systems  Constitutional: Negative.   HENT: Negative.   Eyes: Negative.   Respiratory: Negative.   Cardiovascular: Negative.   Gastrointestinal: Negative.   Musculoskeletal: Negative.   Skin: Negative.   Neurological: Negative.   Psychiatric/Behavioral: Negative.     Blood pressure (!) 153/103, pulse 86, temperature 98.7 F (37.1 C), temperature source Oral, resp. rate 18, height 5\' 7"  (1.702 m), weight 96.2 kg, SpO2 98 %.Body mass index is 33.2 kg/m.  General Appearance: Casual  Eye Contact::  Good  Speech:  Clear and Coherent409  Volume:  Normal  Mood:  Euthymic  Affect:  Appropriate  Thought Process:  Coherent  Orientation:  Full (Time, Place, and Person)  Thought Content:  Logical  Suicidal Thoughts:  No  Homicidal Thoughts:  No  Memory:  Immediate;   Fair Recent;   Fair Remote;   Fair  Judgement:  Fair  Insight:  Fair  Psychomotor Activity:  Normal  Concentration:  Fair  Recall:  [employer] [place]  Language: Fair  Akathisia:  No  Handed:  Right  AIMS (if indicated):     Assets:  Desire for Improvement Housing Physical Health  Sleep:  Number of Hours: 8  Cognition: WNL  ADL's:  Intact   Mental Status Per Nursing Assessment::   On Admission:  NA  Demographic Factors:  Male and Caucasian  Loss Factors: Financial problems/change in socioeconomic status  Historical Factors: Impulsivity  Risk Reduction Factors:   Sense of responsibility to family, Positive  social support and Positive therapeutic relationship  Continued Clinical Symptoms:  Depression:   Impulsivity  Cognitive Features That Contribute To Risk:  None    Suicide Risk:  Minimal: No identifiable suicidal ideation.  Patients presenting with no risk factors but with morbid ruminations; may be classified as minimal risk based on the severity of the depressive symptoms  Follow-up Information    [facility] Follow up.   Why: You are scheduled to meet with Sherrian Divers via zoom on Tuesday, February 2nd at [facility]. Thank you. Contact information: [place] [address] [phone]           Plan Of Care/Follow-up recommendations:  Activity:  Activity as tolerated Diet:  Regular diet Other:  Follow-up with outpatient services through [facility].  Alethia Berthold, MD 11/05/2019, 11:30 AM

## 2019-11-05 NOTE — Progress Notes (Signed)
Patient was in his room upon arrival to the unit. Patient pleasant during assessment denying SI/HI/AVH, pain, anxiety and depression with this Probation officer. Patient compliant with medication administration per MD orders. Patient observed interacting appropriately with staff and peers on the unit. Patient stated he was ready to leave tomorrow. Patient being monitored Q 15 minutes for safety per unit protocol. Patient remains safe on the unit.

## 2019-11-05 NOTE — Discharge Summary (Signed)
Physician Discharge Summary Note  Patient:  Glenn Rasmussen is an 29 y.o., male MRN:  [id number] DOB:  1990-10-12 Patient phone:  [phone] (home)  Patient address:   [address] [place] [address],  Total Time spent with patient: 30 minutes  Date of Admission:  11/02/2019 Date of Discharge: 11/05/19  Reason for Admission:  29 year old man with a history of recurrent depression.  Presented to the emergency room shortly after taking an overdose of multiple medications including losartan and acetaminophen.  Patient says that he had initially intended to take a much larger dose of acetaminophen knowing that it was potentially fatal but then chose not to take quite as many.  Called EMS.  Principal Problem: Severe recurrent major depression without psychotic features [facility]) Discharge Diagnoses: Principal Problem:   Severe recurrent major depression without psychotic features [facility]) Active Problems:   Suicide attempt [facility])   Migraine headache   Past Psychiatric History: Patient has had a prior admission in June of this year also for a serious suicide attempt.  He responded to antidepressant medication and has a history of doing well on antidepressant specifically on Effexor which had been used in the past.  No history of mania no history of substance abuse  Past Medical History:  Past Medical History:  Diagnosis Date  . Anxiety   . Hypertension   . IBS (irritable bowel syndrome)   . Migraines     Past Surgical History:  Procedure Laterality Date  . APPENDECTOMY    . CHOLECYSTECTOMY    . DIALYSIS/PERMA CATHETER INSERTION N/A 03/26/2019   Procedure: DIALYSIS/PERMA CATHETER INSERTION;  Surgeon: Algernon Huxley, MD;  Location: [place] [facility];  Service: Cardiovascular;  Laterality: N/A;  . DIALYSIS/PERMA CATHETER REMOVAL N/A 04/03/2019   Procedure: DIALYSIS/PERMA CATHETER REMOVAL;  Surgeon: Algernon Huxley, MD;  Location: [place] [facility];  Service: Cardiovascular;  Laterality:  N/A;  . TONSILLECTOMY     Family History:  Family History  Problem Relation Age of Onset  . Lung cancer Mother   . Lung cancer Father    Family Psychiatric  History: None reported Social History:  Social History   Substance and Sexual Activity  Alcohol Use No     Social History   Substance and Sexual Activity  Drug Use No    Social History   Socioeconomic History  . Marital status: Single    Spouse name: Not on file  . Number of children: Not on file  . Years of education: Not on file  . Highest education level: Not on file  Occupational History  . Not on file  Tobacco Use  . Smoking status: Never Smoker  . Smokeless tobacco: Never Used  Substance and Sexual Activity  . Alcohol use: No  . Drug use: No  . Sexual activity: Not on file  Other Topics Concern  . Not on file  Social History Narrative  . Not on file   Social Determinants of Health   Financial Resource Strain:   . Difficulty of Paying Living Expenses: Not on file  Food Insecurity:   . Worried About Charity fundraiser in the Last Year: Not on file  . Ran Out of Food in the Last Year: Not on file  Transportation Needs:   . Lack of Transportation (Medical): Not on file  . Lack of Transportation (Non-Medical): Not on file  Physical Activity:   . Days of Exercise per Week: Not on file  . Minutes of Exercise  per Session: Not on file  Stress:   . Feeling of Stress : Not on file  Social Connections:   . Frequency of Communication with Friends and Family: Not on file  . Frequency of Social Gatherings with Friends and Family: Not on file  . Attends Religious Services: Not on file  . Active Member of Clubs or Organizations: Not on file  . Attends Archivist Meetings: Not on file  . Marital Status: Not on file    Hospital Course:  Patient remained on the [facility] unit for 2 days. The patient stabilized on medication and therapy. Patient was discharged on Protonix 40 mg daily, trazodone 200 mg  nightly, Effexor XR 150 mg daily, Norvasc 10 mg daily, labetalol 100 mg twice daily. Patient has shown improvement with improved mood, affect, sleep, appetite, and interaction. Patient has attended group and participated. Patient has been seen in the day room interacting with peers and staff appropriately. Patient denies any SI/HI/AVH and contracts for safety. Patient agrees to follow up at [facility]. Patient is provided with prescriptions for their medications upon discharge.  Physical Findings: AIMS:  , ,  ,  ,    CIWA:    COWS:     Musculoskeletal: Strength & Muscle Tone: within normal limits Gait & Station: normal Patient leans: N/A  Psychiatric Specialty Exam: Physical Exam  Nursing note and vitals reviewed. Constitutional: He is oriented to person, place, and time. He appears well-developed and well-nourished.  Cardiovascular: Normal rate.  Respiratory: Effort normal.  Musculoskeletal:        General: Normal range of motion.  Neurological: He is alert and oriented to person, place, and time.  Skin: Skin is warm.    Review of Systems  Constitutional: Negative.   HENT: Negative.   Eyes: Negative.   Respiratory: Negative.   Cardiovascular: Negative.   Gastrointestinal: Negative.   Genitourinary: Negative.   Musculoskeletal: Negative.   Skin: Negative.   Neurological: Negative.     Blood pressure (!) 153/103, pulse 86, temperature 98.7 F (37.1 C), temperature source Oral, resp. rate 18, height 5\' 7"  (1.702 m), weight 96.2 kg, SpO2 98 %.Body mass index is 33.2 kg/m.   General Appearance: Casual  Eye Contact::  Good  Speech:  Clear and Coherent409  Volume:  Normal  Mood:  Euthymic  Affect:  Appropriate  Thought Process:  Coherent  Orientation:  Full (Time, Place, and Person)  Thought Content:  Logical  Suicidal Thoughts:  No  Homicidal Thoughts:  No  Memory:  Immediate;   Fair Recent;   Fair Remote;   Fair  Judgement:  Fair  Insight:  Fair  Psychomotor Activity:   Normal  Concentration:  Fair  Recall:  [employer] of Knowledge:Fair  Language: Fair  Akathisia:  No  Handed:  Right  AIMS (if indicated):     Assets:  Desire for Improvement Housing Physical Health  Sleep:  Number of Hours: 8  Cognition: WNL  ADL's:  Intact   Have you used any form of tobacco in the last 30 days? (Cigarettes, Smokeless Tobacco, Cigars, and/or Pipes): No  Has this patient used any form of tobacco in the last 30 days? (Cigarettes, Smokeless Tobacco, Cigars, and/or Pipes) Yes, No  Blood Alcohol level:  Lab Results  Component Value Date   [facility] <10 10/30/2019   ETH <10 [id number]    Metabolic Disorder Labs:  Lab Results  Component Value Date   HGBA1C 5.5 03/13/2019   MPG 111 03/13/2019  No results found for: PROLACTIN Lab Results  Component Value Date   CHOL 154 03/13/2019   TRIG 263 (H) 03/23/2019   HDL 32 (L) 03/13/2019   CHOLHDL 4.8 03/13/2019   VLDL 52 (H) 03/13/2019   LDLCALC 70 03/13/2019    See Psychiatric Specialty Exam and Suicide Risk Assessment completed by Attending Physician prior to discharge.  Discharge destination:  Home  Is patient on multiple antipsychotic therapies at discharge:  No   Has Patient had three or more failed trials of antipsychotic monotherapy by history:  No  Recommended Plan for Multiple Antipsychotic Therapies: NA  Discharge Instructions    Diet - low sodium heart healthy   Complete by: As directed    Increase activity slowly   Complete by: As directed      Allergies as of 11/05/2019   No Known Allergies     Medication List    STOP taking these medications   aspirin 325 MG EC tablet   ibuprofen 600 MG tablet Commonly known as: ADVIL   multivitamin with minerals Tabs tablet     TAKE these medications     Indication  amLODipine 10 MG tablet Commonly known as: NORVASC Take 1 tablet (10 mg total) by mouth daily.  Indication: High Blood Pressure Disorder   clonazePAM 0.5 MG tablet Commonly known  as: KLONOPIN Take 1 tablet (0.5 mg total) by mouth 2 (two) times daily as needed (Anxiety).  Indication: Feeling Anxious   labetalol 100 MG tablet Commonly known as: NORMODYNE Take 1 tablet (100 mg total) by mouth 2 (two) times daily.  Indication: High Blood Pressure Disorder   pantoprazole 40 MG tablet Commonly known as: PROTONIX Take 1 tablet (40 mg total) by mouth daily.  Indication: Gastroesophageal Reflux Disease   traZODone 100 MG tablet Commonly known as: DESYREL Take 2 tablets (200 mg total) by mouth at bedtime.  Indication: Trouble Sleeping   venlafaxine XR 150 MG 24 hr capsule Commonly known as: EFFEXOR-XR Take 1 capsule (150 mg total) by mouth daily with breakfast.  Indication: Major Depressive Disorder      Follow-up Information    [facility] Follow up.   Why: You are scheduled to meet with Sherrian Divers via zoom on Tuesday, February 2nd at [facility]. Thank you. Contact information: [place] [address] [phone]           Follow-up recommendations:  Continue activity as tolerated. Continue diet as recommended by your PCP. Ensure to keep all appointments with outpatient providers.  Comments:  Patient is instructed prior to discharge to: Take all medications as prescribed by his/her mental healthcare provider. Report any adverse effects and or reactions from the medicines to his/her outpatient provider promptly. Patient has been instructed & cautioned: To not engage in alcohol and or illegal drug use while on prescription medicines. In the event of worsening symptoms, patient is instructed to call the crisis hotline, 911 and or go to the nearest ED for appropriate evaluation and treatment of symptoms. To follow-up with his/her primary care provider for your other medical issues, concerns and or health care needs.    Signed: Lowry Ram Sayaka Hoeppner, FNP 11/05/2019, 8:54 AM

## 2019-11-05 NOTE — Plan of Care (Signed)
  Problem: Coping Skills Goal: STG - Patient will identify 3 positive coping skills strategies to use post d/c within 5 recreation therapy group sessions Description: STG - Patient will identify 3 positive coping skills strategies to use post d/c within 5 recreation therapy group sessions Outcome: Completed/Met

## 2019-11-05 NOTE — Plan of Care (Signed)
Patient pleasant, compliant on the unit and set to DC tomorrow  Problem: Education: Goal: Emotional status will improve Outcome: Progressing Goal: Mental status will improve Outcome: Progressing

## 2019-11-05 NOTE — Progress Notes (Signed)
Recreation Therapy Notes   Date: 11/05/2019  Time: 9:30 am  Location: Craft room  Behavioral response: Appropriate  Intervention Topic: Relaxation   Discussion/Intervention:  Group content today was focused on relaxation. The group defined relaxation and identified healthy ways to relax. Individuals expressed how much time they spend relaxing. Patients expressed how much their life would be if they did not make time for themselves to relax. The group stated ways they could improve their relaxation techniques in the future.  Individuals participated in the intervention "Time to Relax" where they had a chance to experience different relaxation techniques.  Clinical Observations/Feedback:  Patient came to group and defined relaxation as me time to clear thoughts and chill. He explained that like to read to relax because it takes his mind to another place. Participant expressed that relaxation help with self-reflection. Individual was social with peers and staff while participating in the intervention during group.  Richie Vadala LRT/CTRS          Glenn Rasmussen 11/05/2019 12:02 PM

## 2019-11-05 NOTE — Progress Notes (Signed)
Recreation Therapy Notes  INPATIENT RECREATION TR PLAN  Patient Details Name: Glenn Rasmussen MRN: [id number] DOB: September 16, 1991 Today's Date: 11/05/2019  Rec Therapy Plan Is patient appropriate for Therapeutic Recreation?: Yes Treatment times per week: at least 3 Estimated Length of Stay: 5-7 days TR Treatment/Interventions: Group participation (Comment)  Discharge Criteria Pt will be discharged from therapy if:: Discharged Treatment plan/goals/alternatives discussed and agreed upon by:: Patient/family  Discharge Summary Short term goals set: Patient will identify 3 positive coping skills strategies to use post d/c within 5 recreation therapy group sessions Short term goals met: Complete Progress toward goals comments: Groups attended Which groups?: Coping skills, Other (Comment)(Relaxation, Decision Making) Reason goals not met: N/A Therapeutic equipment acquired: N/A Reason patient discharged from therapy: Discharge from hospital Pt/family agrees with progress & goals achieved: Yes Date patient discharged from therapy: 11/05/19   Sharnelle Cappelli 11/05/2019, 12:38 PM

## 2020-02-02 ENCOUNTER — Other Ambulatory Visit: Payer: Self-pay

## 2020-02-02 ENCOUNTER — Ambulatory Visit: Payer: Self-pay | Admitting: Pharmacy Technician

## 2020-02-02 DIAGNOSIS — Z79899 Other long term (current) drug therapy: Secondary | ICD-10-CM

## 2020-02-02 NOTE — Progress Notes (Signed)
  Patient approved to receive medication assistance at [facility] until time for re-certification in 3979, and as long as eligibility criteria continues to be met.   Provided patient with Civil engineer, contracting based on his particular needs.    [facility]

## 2020-02-23 IMAGING — DX PORTABLE CHEST - 1 VIEW
1 series · 1 of 1 positions shown · non-contrast
Comparison: 03/18/2019

CLINICAL DATA: Acute respiratory failure

EXAM:
PORTABLE CHEST 1 VIEW

[chest ap]
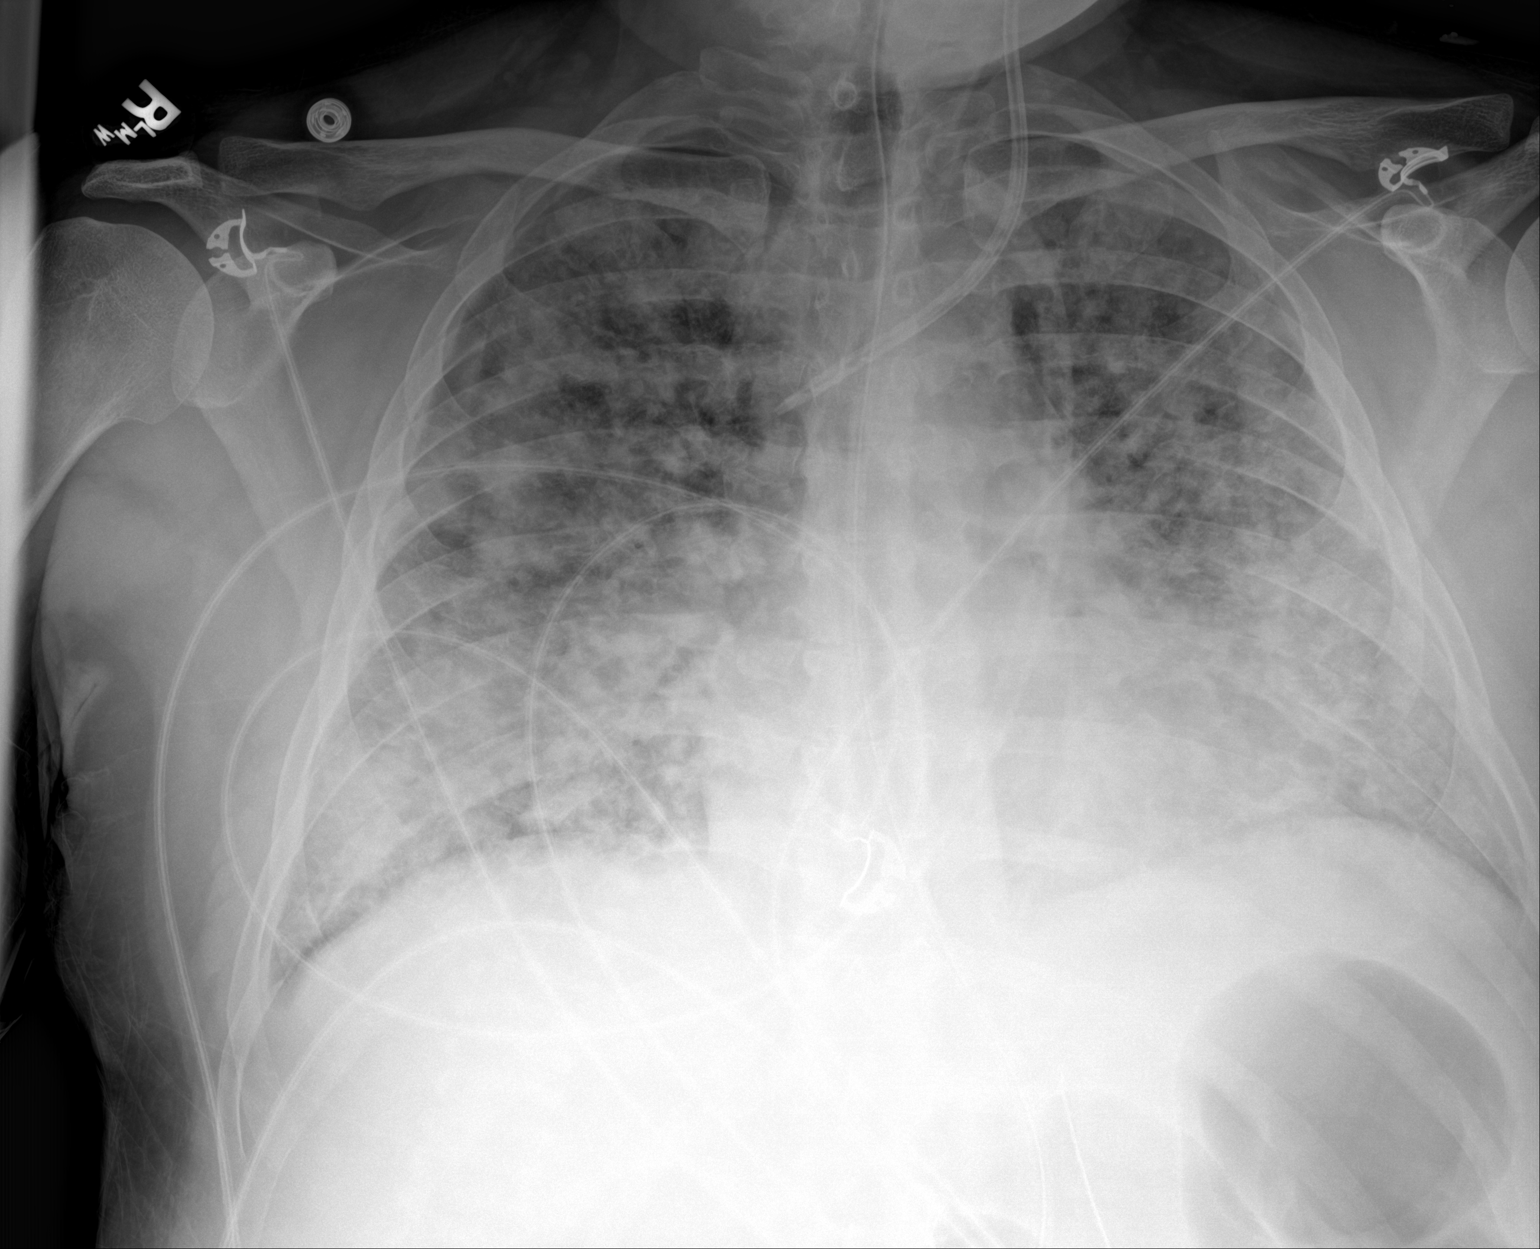

[1 of 1 positions shown; findings below may reference images not displayed]

FINDINGS: Cardiac shadow is stable. Gastric catheter and left jugular central
line are again seen and stable. Patchy bilateral infiltrates are
again identified and stable given some technical variation in the
film. No sizable effusion or pneumothorax is noted. No bony
abnormality is seen.
IMPRESSION: Stable patchy infiltrates bilaterally.

## 2020-02-23 IMAGING — DX ABDOMEN - 1 VIEW
2 series · 2 of 2 positions shown · non-contrast
Comparison: 03/18/2019

CLINICAL DATA: Follow-up ileus

EXAM:
ABDOMEN - 1 VIEW

[abdomen supine (1 of 2)]
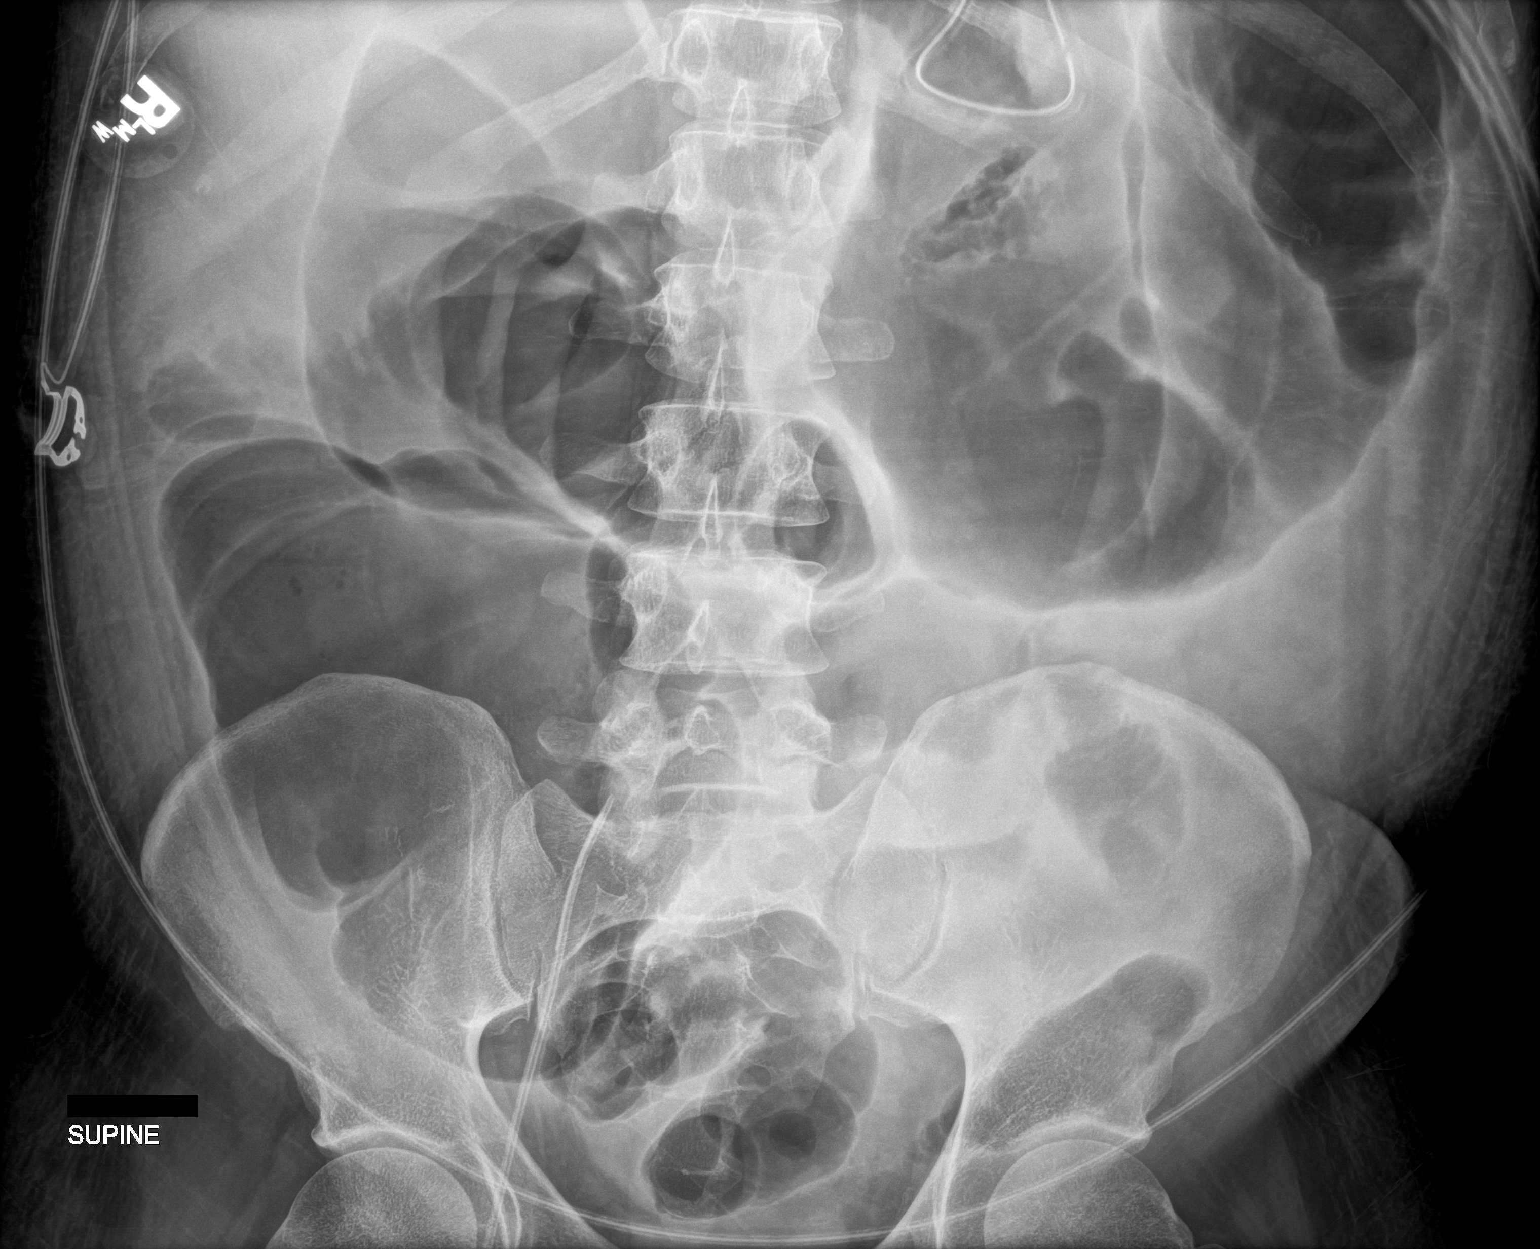

[abdomen supine (2 of 2)]
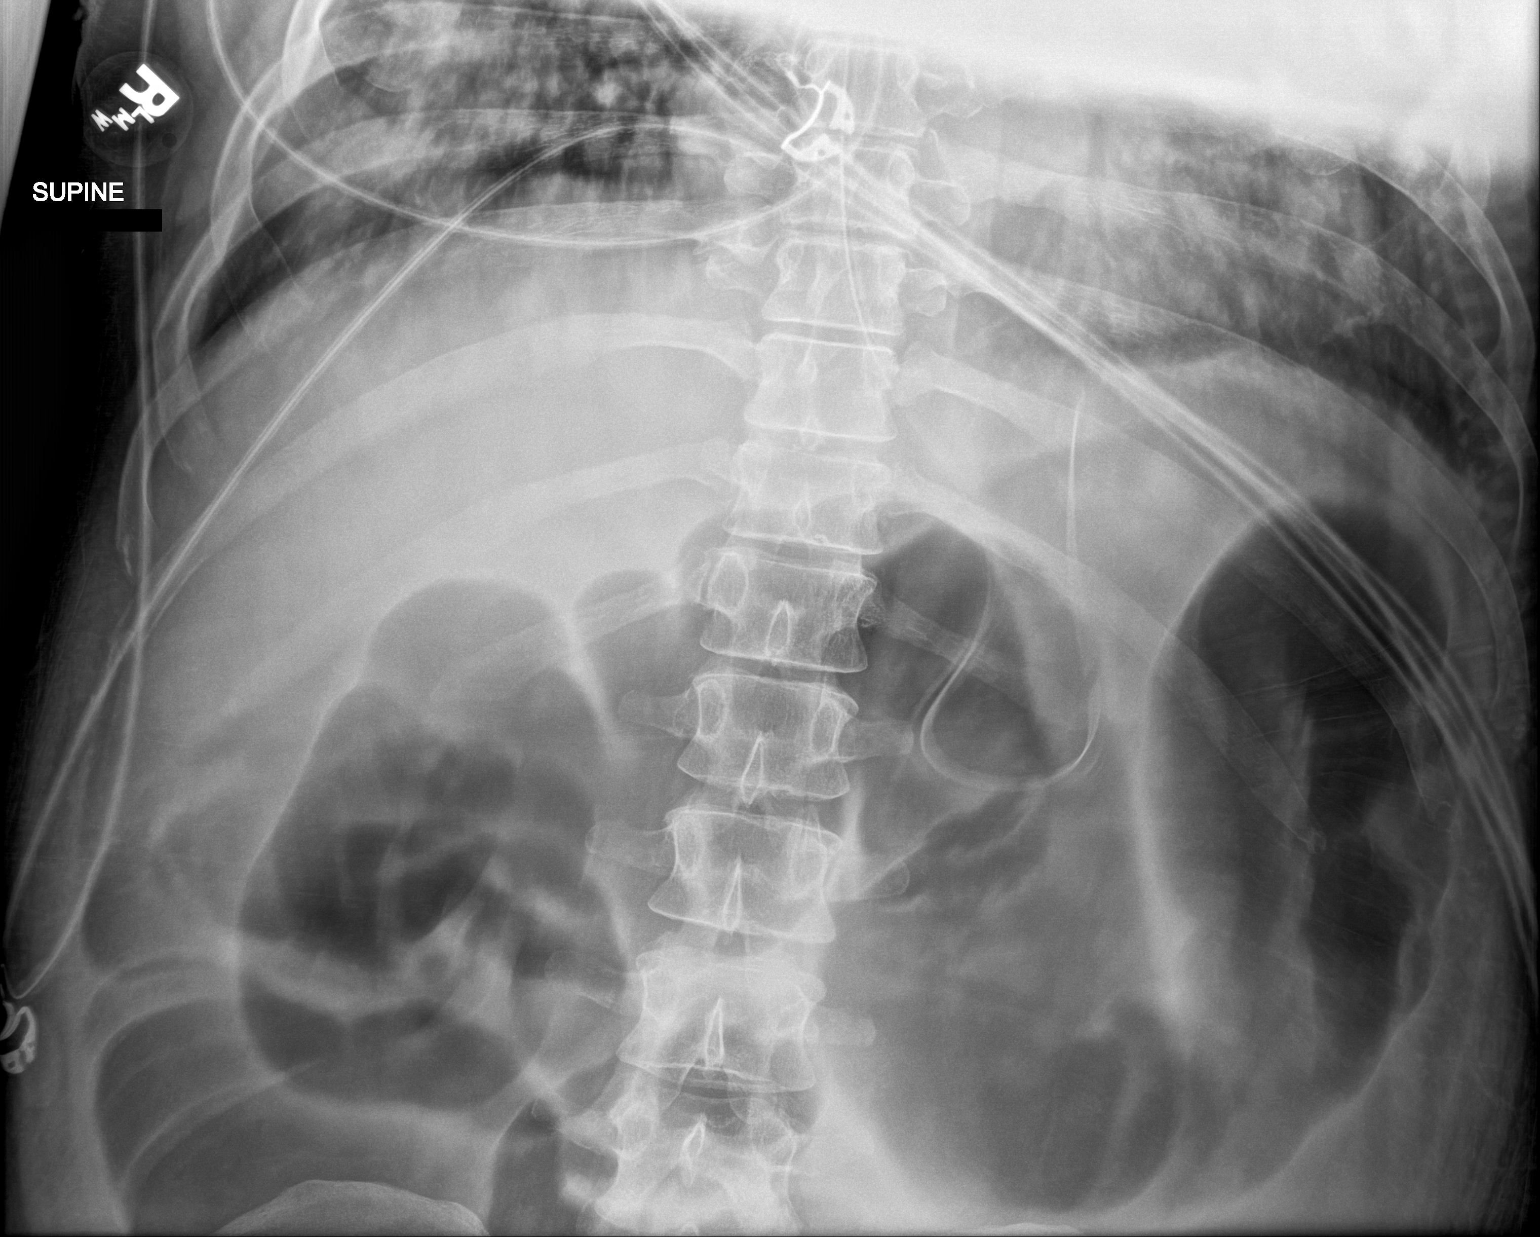

[2 of 2 positions shown; findings below may reference images not displayed]

FINDINGS: Gastric catheter is again noted within the stomach. Right femoral
central line is noted. Diffuse gaseous distension of the colon is
again seen with improved appearance of the small bowel with only a
few mildly prominent loops identified. No free air is seen. No bony
abnormality is noted.
IMPRESSION: Improving ileus with only colonic distension on the current exam.

## 2020-02-26 IMAGING — DX PORTABLE CHEST - 1 VIEW
1 series · 1 of 1 positions shown · non-contrast
Comparison: Radiograph 03/22/2019

CLINICAL DATA: Acute respiratory failure

EXAM:
PORTABLE CHEST 1 VIEW

[chest ap]
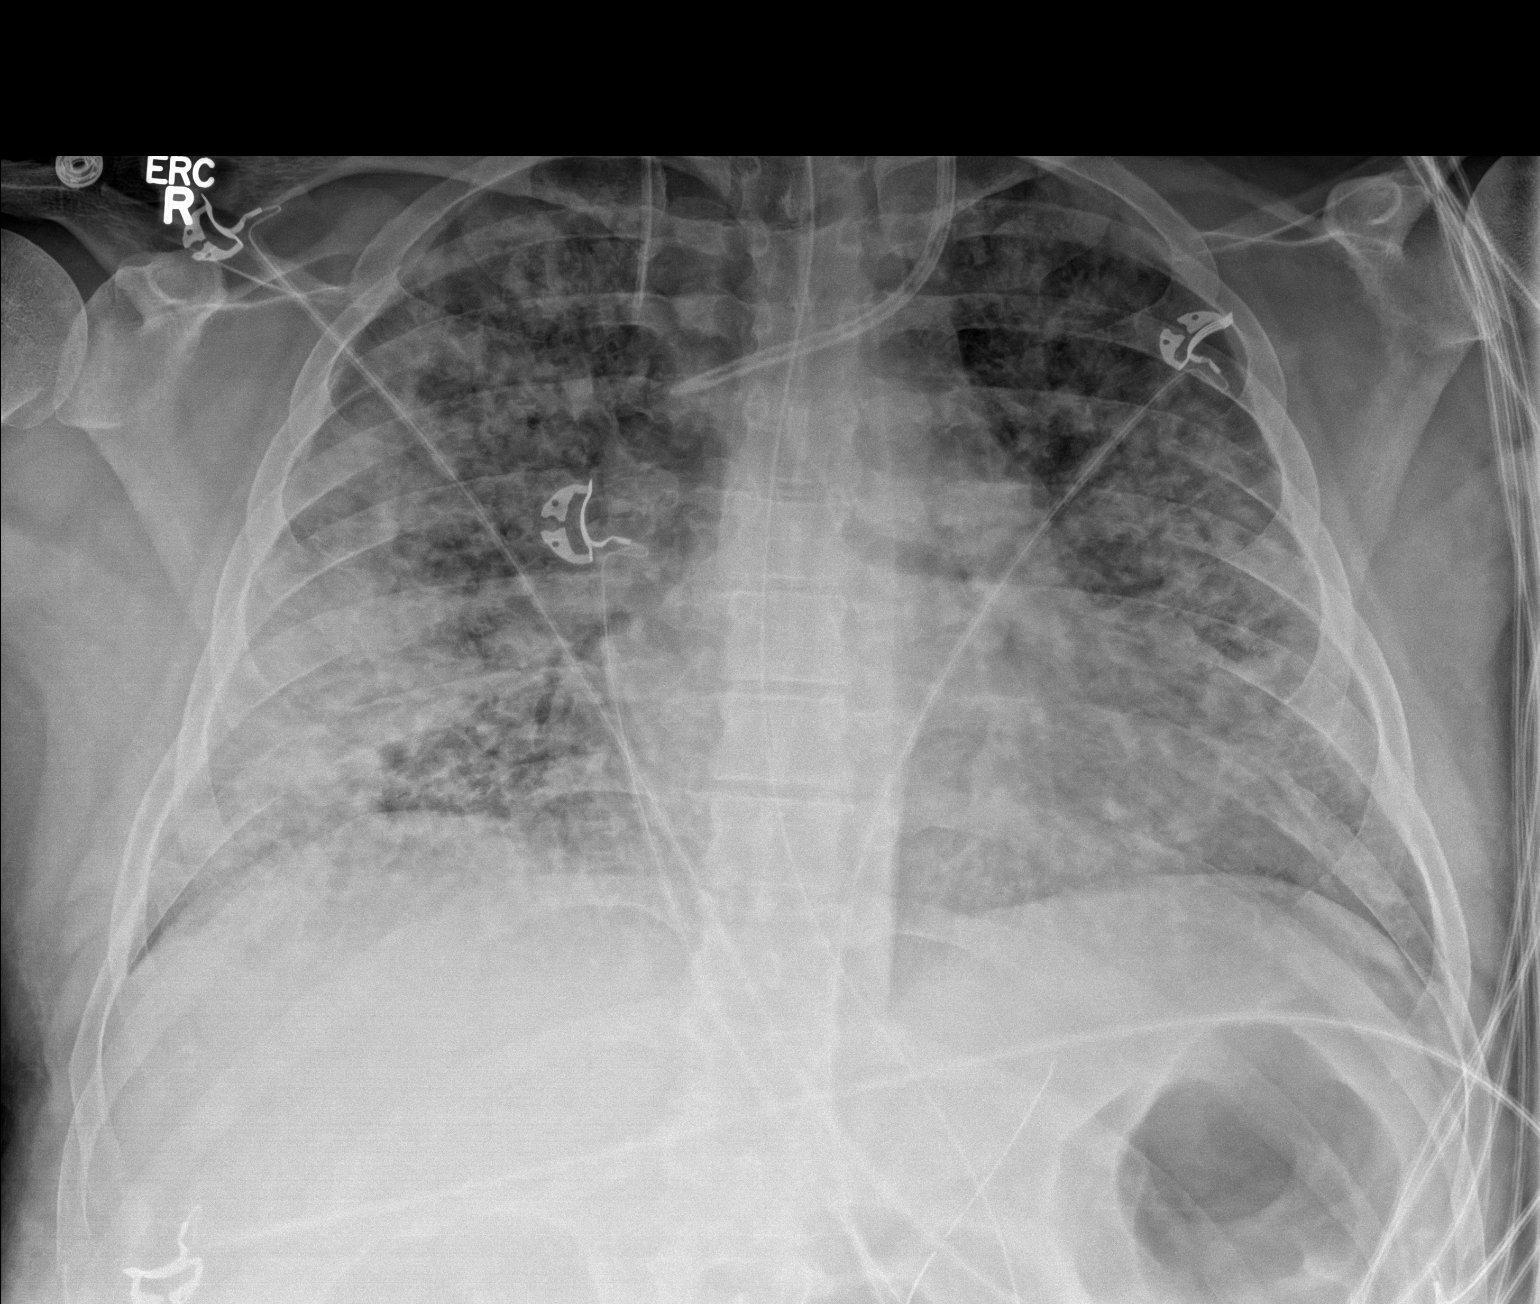

[1 of 1 positions shown; findings below may reference images not displayed]

FINDINGS: Interval extubation. NG tube remains in the stomach. Central venous
lines in the mid SVC unchanged. Normal cardiac silhouette. Diffuse
bilateral nodular airspace disease is not improved. Increased
density in the RIGHT lower lobe. No pneumothorax
IMPRESSION: Is

1. No improvement in diffuse bilateral nodular airspace disease.
Some increased focality in the RIGHT lower lobe.
2. Extubation.
3. Additional support apparatus stable.

## 2020-02-26 IMAGING — DX PORTABLE CHEST - 1 VIEW
1 series · 1 of 1 positions shown · non-contrast
Comparison: 03/20/2019

CLINICAL DATA: Acute respiratory failure. Endotracheally intubated.

EXAM:
PORTABLE CHEST 1 VIEW

[chest ap]
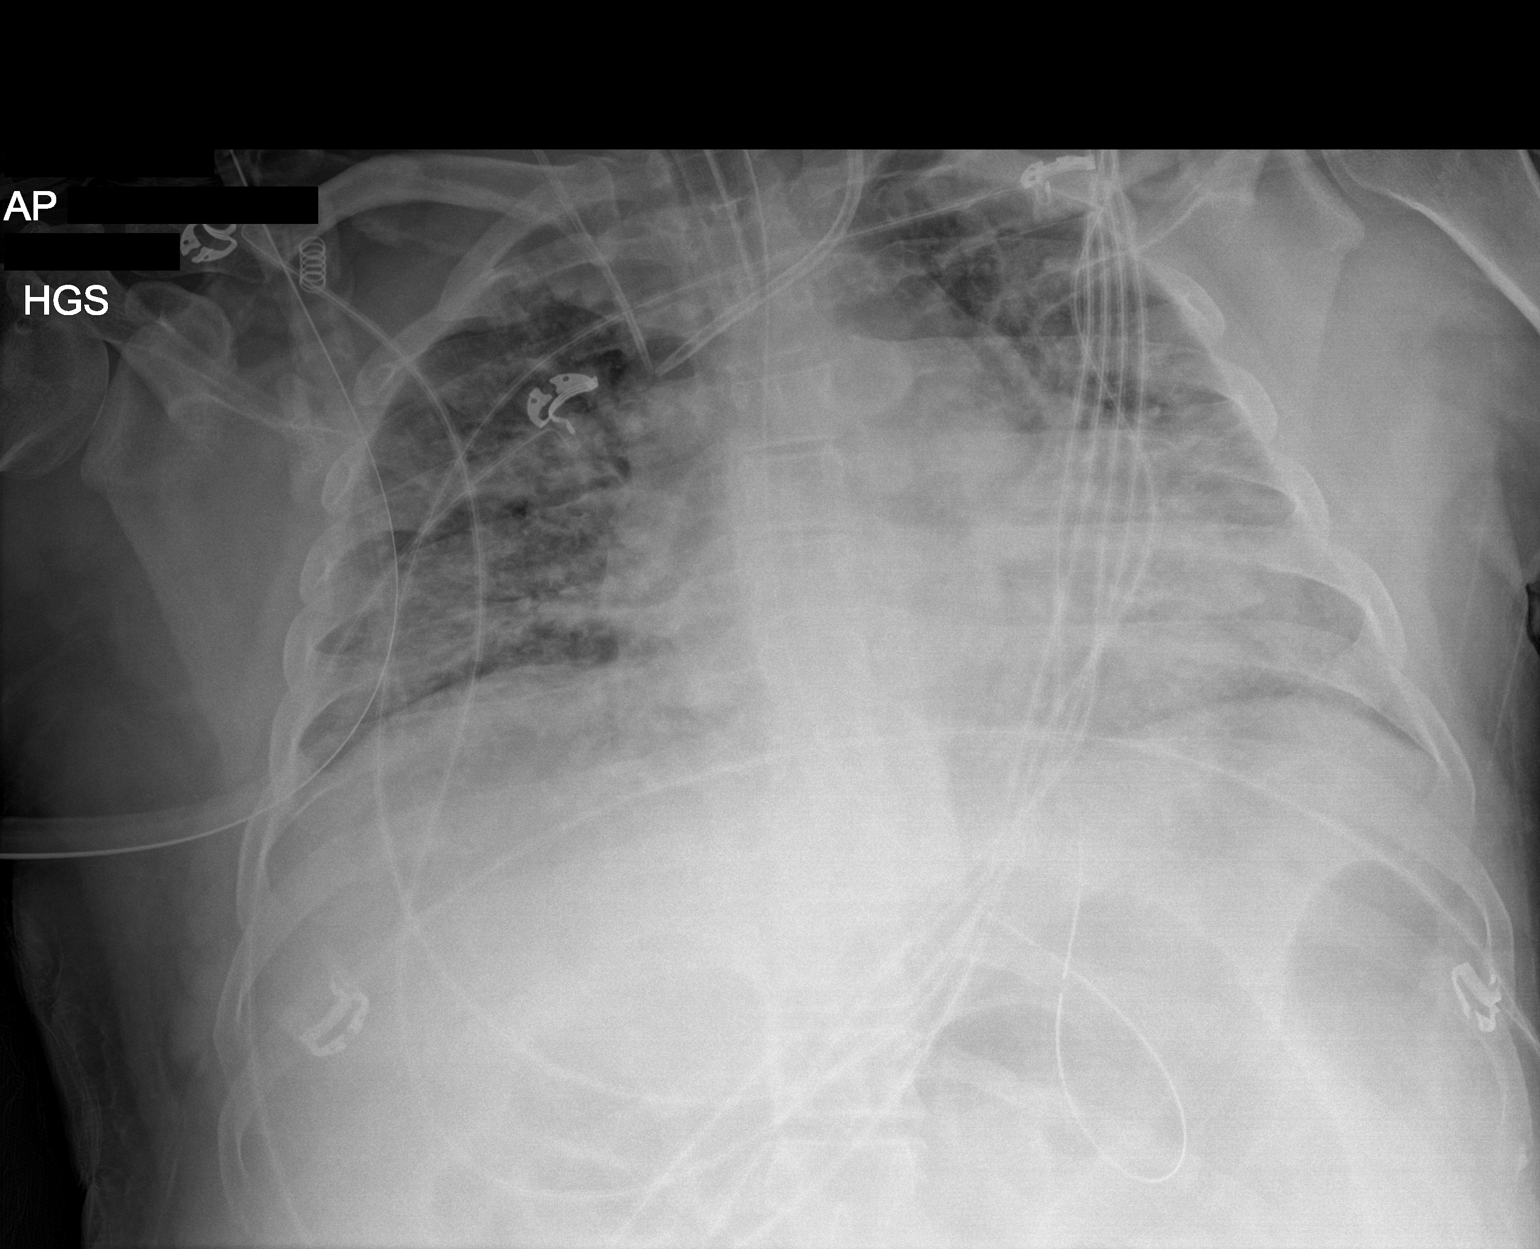

[1 of 1 positions shown; findings below may reference images not displayed]

FINDINGS: Support lines and tubes in appropriate position. Low lung volumes
are again seen. Moderate diffuse pulmonary airspace disease shows no
significant change. Heart size is stable.
IMPRESSION: Moderate diffuse pulmonary airspace disease, without significant
change.

## 2020-12-26 ENCOUNTER — Telehealth: Payer: Self-pay | Admitting: Pharmacy Technician

## 2020-12-26 NOTE — Telephone Encounter (Signed)
Patient failed to provide 2022 proof of income.  No additional medication assistance will be provided by [facility] without the required proof of income documentation.  Patient notified by letter.  [place], [place]  [id number]    December 26, 2020    Glenn Rasmussen [address] [place], [place]  [id number]  Dear Glenn Rasmussen:  This is to inform you that you are no longer eligible to receive medication assistance at [facility].  The reason(s) are:    _____Your total gross monthly household income exceeds 250% of the Federal Poverty Level.   _____Tangible assets (savings, checking, stocks/bonds, pension, retirement, etc.) exceeds our limit  _____You are eligible to receive benefits from [facility], [facility] or HIV Medication              Assistance Program _____You are eligible to receive benefits from a Medicare Part "D" plan _____You have prescription insurance  _____You are not an [facility] resident __X__Failure to provide all requested proof of income information for 2022.    Medication assistance will resume once all requested financial information has been returned to our clinic.  If you have questions, please contact our clinic at [phone].    Thank you,  [facility]

## 2021-02-24 ENCOUNTER — Other Ambulatory Visit: Payer: Self-pay

## 2021-08-17 ENCOUNTER — Other Ambulatory Visit: Payer: Self-pay
